# Patient Record
Sex: Male | Born: 1935 | Race: White | Hispanic: No | Marital: Married | Smoking: Former smoker
Health system: Southern US, Community
[De-identification: ages and names within clinical notes are randomized; demographics above are authoritative.]

## PROBLEM LIST (undated history)

## (undated) ENCOUNTER — Emergency Department (HOSPITAL_COMMUNITY)
Admission: EM | Disposition: A | Discharge status: Home / Self Care | Payer: Medicare Other | Attending: Emergency Medicine | Admitting: Emergency Medicine

## (undated) DIAGNOSIS — IMO0002 Reserved for concepts with insufficient information to code with codable children: Secondary | ICD-10-CM

## (undated) DIAGNOSIS — M1990 Unspecified osteoarthritis, unspecified site: Secondary | ICD-10-CM

## (undated) DIAGNOSIS — R7881 Bacteremia: Secondary | ICD-10-CM

## (undated) DIAGNOSIS — K209 Esophagitis, unspecified without bleeding: Secondary | ICD-10-CM

## (undated) DIAGNOSIS — I8390 Asymptomatic varicose veins of unspecified lower extremity: Secondary | ICD-10-CM

## (undated) DIAGNOSIS — I5022 Chronic systolic (congestive) heart failure: Secondary | ICD-10-CM

## (undated) DIAGNOSIS — R600 Localized edema: Secondary | ICD-10-CM

## (undated) DIAGNOSIS — K589 Irritable bowel syndrome without diarrhea: Secondary | ICD-10-CM

## (undated) DIAGNOSIS — I071 Rheumatic tricuspid insufficiency: Secondary | ICD-10-CM

## (undated) DIAGNOSIS — R32 Unspecified urinary incontinence: Secondary | ICD-10-CM

## (undated) DIAGNOSIS — K648 Other hemorrhoids: Secondary | ICD-10-CM

## (undated) DIAGNOSIS — Z5189 Encounter for other specified aftercare: Secondary | ICD-10-CM

## (undated) DIAGNOSIS — G8929 Other chronic pain: Secondary | ICD-10-CM

## (undated) DIAGNOSIS — Z950 Presence of cardiac pacemaker: Secondary | ICD-10-CM

## (undated) DIAGNOSIS — I272 Pulmonary hypertension, unspecified: Secondary | ICD-10-CM

## (undated) DIAGNOSIS — K5790 Diverticulosis of intestine, part unspecified, without perforation or abscess without bleeding: Secondary | ICD-10-CM

## (undated) DIAGNOSIS — I951 Orthostatic hypotension: Secondary | ICD-10-CM

## (undated) DIAGNOSIS — K56609 Unspecified intestinal obstruction, unspecified as to partial versus complete obstruction: Secondary | ICD-10-CM

## (undated) DIAGNOSIS — D649 Anemia, unspecified: Secondary | ICD-10-CM

## (undated) DIAGNOSIS — G2581 Restless legs syndrome: Secondary | ICD-10-CM

## (undated) DIAGNOSIS — F0390 Unspecified dementia without behavioral disturbance: Secondary | ICD-10-CM

## (undated) DIAGNOSIS — I351 Nonrheumatic aortic (valve) insufficiency: Secondary | ICD-10-CM

## (undated) DIAGNOSIS — I4821 Permanent atrial fibrillation: Secondary | ICD-10-CM

## (undated) DIAGNOSIS — R6251 Failure to thrive (child): Secondary | ICD-10-CM

## (undated) DIAGNOSIS — H3530 Unspecified macular degeneration: Secondary | ICD-10-CM

## (undated) DIAGNOSIS — K635 Polyp of colon: Secondary | ICD-10-CM

## (undated) DIAGNOSIS — R269 Unspecified abnormalities of gait and mobility: Secondary | ICD-10-CM

## (undated) DIAGNOSIS — H189 Unspecified disorder of cornea: Secondary | ICD-10-CM

## (undated) DIAGNOSIS — E871 Hypo-osmolality and hyponatremia: Secondary | ICD-10-CM

## (undated) DIAGNOSIS — K269 Duodenal ulcer, unspecified as acute or chronic, without hemorrhage or perforation: Secondary | ICD-10-CM

## (undated) DIAGNOSIS — R609 Edema, unspecified: Secondary | ICD-10-CM

## (undated) DIAGNOSIS — I340 Nonrheumatic mitral (valve) insufficiency: Secondary | ICD-10-CM

## (undated) DIAGNOSIS — K644 Residual hemorrhoidal skin tags: Secondary | ICD-10-CM

## (undated) DIAGNOSIS — K922 Gastrointestinal hemorrhage, unspecified: Secondary | ICD-10-CM

## (undated) DIAGNOSIS — G629 Polyneuropathy, unspecified: Secondary | ICD-10-CM

## (undated) DIAGNOSIS — K5520 Angiodysplasia of colon without hemorrhage: Secondary | ICD-10-CM

## (undated) DIAGNOSIS — I4892 Unspecified atrial flutter: Secondary | ICD-10-CM

## (undated) DIAGNOSIS — I350 Nonrheumatic aortic (valve) stenosis: Secondary | ICD-10-CM

## (undated) DIAGNOSIS — E039 Hypothyroidism, unspecified: Secondary | ICD-10-CM

## (undated) DIAGNOSIS — I10 Essential (primary) hypertension: Secondary | ICD-10-CM

## (undated) HISTORY — DX: Bacteremia: R78.81

## (undated) HISTORY — DX: Polyneuropathy, unspecified: G62.9

## (undated) HISTORY — PX: HEMORRHOID SURGERY: SHX153

## (undated) HISTORY — DX: Unspecified atrial flutter: I48.92

## (undated) HISTORY — DX: Other hemorrhoids: K64.8

## (undated) HISTORY — DX: Chronic systolic (congestive) heart failure: I50.22

## (undated) HISTORY — DX: Residual hemorrhoidal skin tags: K64.4

## (undated) HISTORY — DX: Unspecified intestinal obstruction, unspecified as to partial versus complete obstruction: K56.609

## (undated) HISTORY — DX: Localized edema: R60.0

## (undated) HISTORY — PX: AV NODE ABLATION: SHX1209

## (undated) HISTORY — PX: PACEMAKER INSERTION: SHX728

## (undated) HISTORY — PX: KNEE ARTHROSCOPY: SHX127

## (undated) HISTORY — PX: TONSILLECTOMY: SUR1361

## (undated) HISTORY — DX: Esophagitis, unspecified: K20.9

## (undated) HISTORY — DX: Duodenal ulcer, unspecified as acute or chronic, without hemorrhage or perforation: K26.9

## (undated) HISTORY — DX: Unspecified macular degeneration: H35.30

## (undated) HISTORY — DX: Other chronic pain: G89.29

## (undated) HISTORY — DX: Reserved for concepts with insufficient information to code with codable children: IMO0002

## (undated) HISTORY — DX: Diverticulosis of intestine, part unspecified, without perforation or abscess without bleeding: K57.90

## (undated) HISTORY — DX: Essential (primary) hypertension: I10

## (undated) HISTORY — PX: EYE SURGERY: SHX253

## (undated) HISTORY — PX: BACK SURGERY: SHX140

## (undated) HISTORY — DX: Nonrheumatic mitral (valve) insufficiency: I34.0

## (undated) HISTORY — DX: Edema, unspecified: R60.9

## (undated) HISTORY — DX: Irritable bowel syndrome, unspecified: K58.9

## (undated) HISTORY — DX: Asymptomatic varicose veins of unspecified lower extremity: I83.90

## (undated) HISTORY — DX: Hypo-osmolality and hyponatremia: E87.1

## (undated) HISTORY — DX: Rheumatic tricuspid insufficiency: I07.1

## (undated) HISTORY — DX: Orthostatic hypotension: I95.1

## (undated) HISTORY — DX: Failure to thrive (child): R62.51

## (undated) HISTORY — DX: Permanent atrial fibrillation: I48.21

## (undated) HISTORY — DX: Angiodysplasia of colon without hemorrhage: K55.20

## (undated) HISTORY — DX: Nonrheumatic aortic (valve) insufficiency: I35.1

## (undated) HISTORY — DX: Unspecified abnormalities of gait and mobility: R26.9

## (undated) HISTORY — DX: Pulmonary hypertension, unspecified: I27.20

## (undated) HISTORY — DX: Esophagitis, unspecified without bleeding: K20.90

## (undated) HISTORY — DX: Polyp of colon: K63.5

## (undated) HISTORY — DX: Nonrheumatic aortic (valve) stenosis: I35.0

## (undated) HISTORY — DX: Unspecified disorder of cornea: H18.9

---

## 1959-02-13 HISTORY — PX: INGUINAL HERNIA REPAIR: SUR1180

## 1971-06-15 HISTORY — PX: INGUINAL HERNIA REPAIR: SUR1180

## 1999-05-26 ENCOUNTER — Encounter: Admission: RE | Admit: 1999-05-26 | Discharge: 1999-05-26 | Payer: Self-pay | Admitting: Family Medicine

## 1999-05-26 ENCOUNTER — Encounter: Payer: Self-pay | Admitting: Family Medicine

## 1999-12-21 ENCOUNTER — Encounter: Admission: RE | Admit: 1999-12-21 | Discharge: 1999-12-21 | Payer: Self-pay | Admitting: Family Medicine

## 1999-12-21 ENCOUNTER — Encounter: Payer: Self-pay | Admitting: Family Medicine

## 2000-08-18 ENCOUNTER — Ambulatory Visit (HOSPITAL_COMMUNITY): Admission: RE | Admit: 2000-08-18 | Discharge: 2000-08-18 | Payer: Self-pay | Admitting: *Deleted

## 2000-10-27 ENCOUNTER — Encounter: Admission: RE | Admit: 2000-10-27 | Discharge: 2000-10-27 | Payer: Self-pay | Admitting: Family Medicine

## 2000-10-27 ENCOUNTER — Encounter: Payer: Self-pay | Admitting: Family Medicine

## 2001-06-21 ENCOUNTER — Encounter: Payer: Self-pay | Admitting: Family Medicine

## 2001-06-21 ENCOUNTER — Encounter: Admission: RE | Admit: 2001-06-21 | Discharge: 2001-06-21 | Payer: Self-pay | Admitting: Family Medicine

## 2002-04-26 ENCOUNTER — Encounter: Admission: RE | Admit: 2002-04-26 | Discharge: 2002-06-11 | Payer: Self-pay | Admitting: Otolaryngology

## 2003-07-11 ENCOUNTER — Encounter: Admission: RE | Admit: 2003-07-11 | Discharge: 2003-07-11 | Payer: Self-pay | Admitting: Family Medicine

## 2003-11-05 ENCOUNTER — Inpatient Hospital Stay (HOSPITAL_COMMUNITY): Admission: EM | Admit: 2003-11-05 | Discharge: 2003-11-15 | Payer: Self-pay | Admitting: Emergency Medicine

## 2004-01-15 ENCOUNTER — Ambulatory Visit (HOSPITAL_COMMUNITY): Admission: RE | Admit: 2004-01-15 | Discharge: 2004-01-15 | Payer: Self-pay | Admitting: Internal Medicine

## 2004-01-24 ENCOUNTER — Inpatient Hospital Stay (HOSPITAL_COMMUNITY): Admission: EM | Admit: 2004-01-24 | Discharge: 2004-01-27 | Payer: Self-pay | Admitting: Internal Medicine

## 2004-02-25 ENCOUNTER — Ambulatory Visit (HOSPITAL_COMMUNITY): Admission: RE | Admit: 2004-02-25 | Discharge: 2004-02-25 | Payer: Self-pay | Admitting: Internal Medicine

## 2004-03-10 ENCOUNTER — Ambulatory Visit (HOSPITAL_COMMUNITY): Admission: RE | Admit: 2004-03-10 | Discharge: 2004-03-10 | Payer: Self-pay | Admitting: Internal Medicine

## 2004-05-14 ENCOUNTER — Ambulatory Visit: Payer: Self-pay | Admitting: Internal Medicine

## 2004-06-19 ENCOUNTER — Ambulatory Visit: Payer: Self-pay | Admitting: Internal Medicine

## 2004-07-13 ENCOUNTER — Encounter: Admission: RE | Admit: 2004-07-13 | Discharge: 2004-07-13 | Payer: Self-pay | Admitting: Family Medicine

## 2004-07-21 ENCOUNTER — Encounter: Admission: RE | Admit: 2004-07-21 | Discharge: 2004-07-21 | Payer: Self-pay | Admitting: Family Medicine

## 2004-08-05 ENCOUNTER — Encounter: Admission: RE | Admit: 2004-08-05 | Discharge: 2004-08-05 | Payer: Self-pay | Admitting: Family Medicine

## 2004-08-18 ENCOUNTER — Encounter: Admission: RE | Admit: 2004-08-18 | Discharge: 2004-08-18 | Payer: Self-pay | Admitting: Family Medicine

## 2004-08-26 ENCOUNTER — Ambulatory Visit: Payer: Self-pay | Admitting: Internal Medicine

## 2004-11-16 ENCOUNTER — Ambulatory Visit: Payer: Self-pay | Admitting: Internal Medicine

## 2005-02-19 ENCOUNTER — Ambulatory Visit: Payer: Self-pay | Admitting: Internal Medicine

## 2005-04-13 ENCOUNTER — Ambulatory Visit: Payer: Self-pay | Admitting: Internal Medicine

## 2005-04-25 ENCOUNTER — Emergency Department (HOSPITAL_COMMUNITY): Admission: EM | Admit: 2005-04-25 | Discharge: 2005-04-25 | Payer: Self-pay | Admitting: Emergency Medicine

## 2005-05-18 ENCOUNTER — Ambulatory Visit: Payer: Self-pay | Admitting: Internal Medicine

## 2005-05-27 ENCOUNTER — Ambulatory Visit: Payer: Self-pay | Admitting: Internal Medicine

## 2005-06-03 ENCOUNTER — Ambulatory Visit (HOSPITAL_COMMUNITY): Admission: RE | Admit: 2005-06-03 | Discharge: 2005-06-03 | Payer: Self-pay | Admitting: Internal Medicine

## 2005-07-27 ENCOUNTER — Encounter: Admission: RE | Admit: 2005-07-27 | Discharge: 2005-07-27 | Payer: Self-pay | Admitting: Family Medicine

## 2005-09-23 ENCOUNTER — Ambulatory Visit (HOSPITAL_COMMUNITY): Admission: RE | Admit: 2005-09-23 | Discharge: 2005-09-23 | Payer: Self-pay | Admitting: Interventional Cardiology

## 2006-03-23 ENCOUNTER — Ambulatory Visit (HOSPITAL_COMMUNITY): Admission: RE | Admit: 2006-03-23 | Discharge: 2006-03-23 | Payer: Self-pay | Admitting: Interventional Cardiology

## 2006-03-25 ENCOUNTER — Ambulatory Visit: Payer: Self-pay | Admitting: Internal Medicine

## 2006-03-29 ENCOUNTER — Ambulatory Visit: Payer: Self-pay | Admitting: Cardiology

## 2006-06-21 ENCOUNTER — Ambulatory Visit: Payer: Self-pay | Admitting: Internal Medicine

## 2006-08-25 ENCOUNTER — Ambulatory Visit (HOSPITAL_COMMUNITY): Admission: RE | Admit: 2006-08-25 | Discharge: 2006-08-25 | Payer: Self-pay | Admitting: *Deleted

## 2006-10-19 ENCOUNTER — Encounter: Admission: RE | Admit: 2006-10-19 | Discharge: 2006-10-19 | Payer: Self-pay | Admitting: *Deleted

## 2006-12-19 ENCOUNTER — Encounter: Admission: RE | Admit: 2006-12-19 | Discharge: 2007-03-13 | Payer: Self-pay | Admitting: Family Medicine

## 2008-01-05 ENCOUNTER — Encounter: Admission: RE | Admit: 2008-01-05 | Discharge: 2008-01-05 | Payer: Self-pay | Admitting: Specialist

## 2008-02-21 ENCOUNTER — Encounter: Admission: RE | Admit: 2008-02-21 | Discharge: 2008-02-21 | Payer: Self-pay | Admitting: Interventional Cardiology

## 2008-02-22 ENCOUNTER — Emergency Department (HOSPITAL_COMMUNITY): Admission: EM | Admit: 2008-02-22 | Discharge: 2008-02-22 | Payer: Self-pay | Admitting: Emergency Medicine

## 2008-02-22 ENCOUNTER — Encounter (INDEPENDENT_AMBULATORY_CARE_PROVIDER_SITE_OTHER): Payer: Self-pay | Admitting: Emergency Medicine

## 2008-02-22 ENCOUNTER — Ambulatory Visit: Payer: Self-pay | Admitting: Vascular Surgery

## 2008-03-04 ENCOUNTER — Ambulatory Visit: Payer: Self-pay | Admitting: Oncology

## 2008-04-10 LAB — APTT: aPTT: 43 seconds — ABNORMAL HIGH (ref 24–37)

## 2008-04-10 LAB — CBC & DIFF AND RETIC
Eosinophils Absolute: 0.1 10*3/uL (ref 0.0–0.5)
MONO#: 0.7 10*3/uL (ref 0.1–0.9)
NEUT#: 3 10*3/uL (ref 1.5–6.5)
RBC: 4 10*6/uL — ABNORMAL LOW (ref 4.20–5.71)
RDW: 13.7 % (ref 11.2–14.6)
RETIC #: 63.6 10*3/uL (ref 31.8–103.9)
Retic %: 1.6 % (ref 0.7–2.3)
WBC: 5.1 10*3/uL (ref 4.0–10.0)
lymph#: 1.2 10*3/uL (ref 0.9–3.3)

## 2008-04-10 LAB — PROTHROMBIN TIME: Prothrombin Time: 19.8 seconds — ABNORMAL HIGH (ref 11.6–15.2)

## 2008-04-10 LAB — MORPHOLOGY: PLT EST: ADEQUATE

## 2008-04-11 ENCOUNTER — Encounter: Payer: Self-pay | Admitting: Internal Medicine

## 2008-04-17 LAB — COMPREHENSIVE METABOLIC PANEL
AST: 21 U/L (ref 0–37)
Alkaline Phosphatase: 36 U/L — ABNORMAL LOW (ref 39–117)
BUN: 21 mg/dL (ref 6–23)
Calcium: 9.7 mg/dL (ref 8.4–10.5)
Chloride: 102 mEq/L (ref 96–112)
Creatinine, Ser: 0.74 mg/dL (ref 0.40–1.50)

## 2008-04-17 LAB — CARDIOLIPIN ANTIBODIES, IGG, IGM, IGA: Anticardiolipin IgA: 9 [APL'U] (ref <13)

## 2008-04-17 LAB — BETA-2 GLYCOPROTEIN ANTIBODIES
Beta-2-Glycoprotein I IgA: <4 U/mL (ref <10)
Beta-2-Glycoprotein I IgM: <4 U/mL (ref <10)

## 2008-04-17 LAB — LUPUS ANTICOAGULANT PANEL
PTTLA 4:1 Mix: 56.5 secs — ABNORMAL HIGH (ref 36.3–48.8)
PTTLA Confirmation: 0.4 secs (ref <8.0)

## 2008-04-19 ENCOUNTER — Ambulatory Visit: Payer: Self-pay | Admitting: Oncology

## 2008-05-29 ENCOUNTER — Ambulatory Visit (HOSPITAL_BASED_OUTPATIENT_CLINIC_OR_DEPARTMENT_OTHER): Admission: RE | Admit: 2008-05-29 | Discharge: 2008-05-30 | Payer: Self-pay | Admitting: Specialist

## 2008-07-22 ENCOUNTER — Encounter: Admission: RE | Admit: 2008-07-22 | Discharge: 2008-10-16 | Payer: Self-pay | Admitting: Family Medicine

## 2008-10-28 ENCOUNTER — Ambulatory Visit (HOSPITAL_COMMUNITY): Admission: RE | Admit: 2008-10-28 | Discharge: 2008-10-28 | Payer: Self-pay | Admitting: *Deleted

## 2008-10-29 ENCOUNTER — Ambulatory Visit (HOSPITAL_COMMUNITY): Admission: RE | Admit: 2008-10-29 | Discharge: 2008-10-29 | Payer: Self-pay | Admitting: *Deleted

## 2008-12-02 ENCOUNTER — Encounter: Admission: RE | Admit: 2008-12-02 | Discharge: 2008-12-02 | Payer: Self-pay | Admitting: Neurology

## 2009-02-08 ENCOUNTER — Encounter: Admission: RE | Admit: 2009-02-08 | Discharge: 2009-02-08 | Payer: Self-pay | Admitting: Neurology

## 2009-04-07 ENCOUNTER — Encounter: Admission: RE | Admit: 2009-04-07 | Discharge: 2009-06-11 | Payer: Self-pay | Admitting: Neurology

## 2009-05-14 HISTORY — PX: PACEMAKER REVISION: SHX5999

## 2009-05-31 ENCOUNTER — Emergency Department (HOSPITAL_COMMUNITY): Admission: EM | Admit: 2009-05-31 | Discharge: 2009-05-31 | Payer: Self-pay | Admitting: Emergency Medicine

## 2009-06-14 DIAGNOSIS — K922 Gastrointestinal hemorrhage, unspecified: Secondary | ICD-10-CM

## 2009-06-14 HISTORY — DX: Gastrointestinal hemorrhage, unspecified: K92.2

## 2009-07-29 ENCOUNTER — Ambulatory Visit: Payer: Self-pay | Admitting: Diagnostic Radiology

## 2009-07-29 ENCOUNTER — Encounter: Payer: Self-pay | Admitting: Emergency Medicine

## 2009-07-29 ENCOUNTER — Inpatient Hospital Stay (HOSPITAL_COMMUNITY): Admission: AD | Admit: 2009-07-29 | Discharge: 2009-08-05 | Payer: Self-pay | Admitting: Internal Medicine

## 2009-08-01 ENCOUNTER — Encounter (INDEPENDENT_AMBULATORY_CARE_PROVIDER_SITE_OTHER): Payer: Self-pay | Admitting: Internal Medicine

## 2009-08-20 ENCOUNTER — Ambulatory Visit (HOSPITAL_COMMUNITY): Admission: RE | Admit: 2009-08-20 | Discharge: 2009-08-20 | Payer: Self-pay | Admitting: Interventional Radiology

## 2009-09-09 ENCOUNTER — Encounter: Payer: Self-pay | Admitting: Interventional Radiology

## 2010-01-02 ENCOUNTER — Encounter: Admission: RE | Admit: 2010-01-02 | Discharge: 2010-03-13 | Payer: Self-pay | Admitting: Ophthalmology

## 2010-02-12 DIAGNOSIS — K56609 Unspecified intestinal obstruction, unspecified as to partial versus complete obstruction: Secondary | ICD-10-CM

## 2010-02-12 HISTORY — DX: Unspecified intestinal obstruction, unspecified as to partial versus complete obstruction: K56.609

## 2010-03-04 ENCOUNTER — Inpatient Hospital Stay (HOSPITAL_COMMUNITY): Admission: EM | Admit: 2010-03-04 | Discharge: 2010-03-07 | Payer: Self-pay | Admitting: Emergency Medicine

## 2010-03-23 ENCOUNTER — Encounter: Payer: Self-pay | Admitting: Pulmonary Disease

## 2010-03-29 ENCOUNTER — Emergency Department (HOSPITAL_COMMUNITY): Admission: EM | Admit: 2010-03-29 | Discharge: 2010-03-29 | Payer: Self-pay | Admitting: Emergency Medicine

## 2010-04-06 ENCOUNTER — Encounter: Payer: Self-pay | Admitting: Pulmonary Disease

## 2010-04-09 ENCOUNTER — Encounter: Payer: Self-pay | Admitting: Pulmonary Disease

## 2010-04-11 ENCOUNTER — Encounter: Payer: Self-pay | Admitting: Pulmonary Disease

## 2010-04-14 ENCOUNTER — Encounter: Payer: Self-pay | Admitting: Pulmonary Disease

## 2010-04-14 ENCOUNTER — Encounter: Admission: RE | Admit: 2010-04-14 | Discharge: 2010-04-14 | Payer: Self-pay | Admitting: Internal Medicine

## 2010-04-15 DIAGNOSIS — F068 Other specified mental disorders due to known physiological condition: Secondary | ICD-10-CM | POA: Insufficient documentation

## 2010-04-16 ENCOUNTER — Ambulatory Visit: Payer: Self-pay | Admitting: Pulmonary Disease

## 2010-04-16 DIAGNOSIS — J90 Pleural effusion, not elsewhere classified: Secondary | ICD-10-CM

## 2010-04-17 ENCOUNTER — Encounter: Payer: Self-pay | Admitting: Pulmonary Disease

## 2010-04-23 ENCOUNTER — Encounter: Payer: Self-pay | Admitting: Pulmonary Disease

## 2010-07-08 ENCOUNTER — Emergency Department (HOSPITAL_COMMUNITY)
Admission: EM | Admit: 2010-07-08 | Discharge: 2010-07-08 | Payer: Self-pay | Source: Home / Self Care | Admitting: Emergency Medicine

## 2010-07-16 NOTE — Assessment & Plan Note (Signed)
Summary: consult for bilateral pleural effusions.   Visit Type:  Initial Consult Copy to:  Art Robyne Askew Primary Provider/Referring Provider:  Merri Brunette MD  CC:  pulmonary consult. pt states he gets real sob with activity and at rest. .  History of Present Illness: The pt is an 77 y/lo male who I have been asked to see for a persistently abnormal chest xray.  The pt has recently had RLL pna followed by LLL infiltrate, and has been treated appropriately with avelox.  When he had RLL pna, the question was raised about a hilar abnormality, and he subsequently underwent ct chest.  He was found to have large bilat effusions with compressive atx in bases, as well as vascular congestion.  He had no isolated infiltrate or LN.  The pt has known h/o cardiac disease, and most recently found to have very elevated BNP and hyponatremia, c/w volume overload.  He has also been having worsening LE edema.   The question has also been raised about aspiration, given his dementia, and alternating lower lobe infiltrate on cxr.  He is scheduled to have a speech evaluation.  The pt currently denies any cough, congestion, or purulent mucus.  In fact, he never had any of these symptoms even when he was found to have the infiltrates.  He has chronic doe, and sometimes can occur at rest.  He denies any swallowing issues or coughing/choking related to by mouth intake.  He does have an elevated TSH recently, and adjustments are being made to his meds.  He has also had LE venous dopplers recently which were negative for DVT.    Preventive Screening-Counseling & Management  Alcohol-Tobacco     Smoking Status: quit  Current Medications (verified): 1)  Vitamin C 500 Mg Tabs (Ascorbic Acid) .... Once Daily 2)  Restasis 0.05 % Emul (Cyclosporine) .... Instill 1 Drop Into Each Eye Two Times A Day 3)  Aricept 10 Mg Tabs (Donepezil Hcl) .... Once Daily 4)  Senna-S 8.6-50 Mg Tabs (Sennosides-Docusate Sodium) .... 2 Tablets  At Bedtime 5)  Ferrous Sulfate 325 (65 Fe) Mg Tabs (Ferrous Sulfate) .... Once Daily 6)  Fish Oil 1000 Mg Caps (Omega-3 Fatty Acids) .... One Tablet Two Times A Day 7)  Hyoscyamine Sulfate 0.125 Mg Tabs (Hyoscyamine Sulfate) .... Once Daily As Needed To Help Bowl Discomfort 8)  Icaps Plus  Tabs (Multiple Vitamins-Minerals) .... Once Daily 9)  Levothyroxine Sodium 75 Mcg Tabs (Levothyroxine Sodium) .... Once Daily For Thyroid 10)  Warfarin Sodium 5 Mg Tabs (Warfarin Sodium) .... As Directed 11)  Multivitamins  Tabs (Multiple Vitamin) .... Once Daily 12)  Miralax  Powd (Polyethylene Glycol 3350) .... Mix 17 Grams With  8 Oz of Water 13)  Oxycodone Hcl 5 Mg Tabs (Oxycodone Hcl) .... Take One Tablet Every 4 Hours As Needed 14)  Aminofen 325 Mg Tabs (Acetaminophen) .... Taje 2 Tablets Every 4 Hours As Needed For Pain 15)  Flexeril 5 Mg Tabs (Cyclobenzaprine Hcl) .... Take One Tablet Every 8 Hrs As Needed  For Muscle Relaxer 16)  Lorazepam 0.5 Mg Tabs (Lorazepam) .... One Tablet Two Times A Day 17)  Meclizine Hcl 25 Mg Tabs (Meclizine Hcl) .... One Up To 3 Times A Day As Needed For Dizziness 18)  Systane 0.4-0.3 % Soln (Polyethyl Glycol-Propyl Glycol) .... Instill 1 Drop Into Both Eyes Once Daily  Allergies (verified): 1)  ! Pcn  Past History:  Past Medical History: CAF--s/p ablation and PPM CAD (ICD-414.00) HYPOTHYROIDISM (ICD-244.9)  DEMENTIA  of alzheimer's type  Past Surgical History: hernia repair pacemaker insertion hemroidectomy  Family History: Reviewed history and no changes required. emphysema--mother  Social History: Reviewed history and no changes required. Patient states former smoker. started age 36. 2 ppd. quit 1973.  married children--4 occupation--retired--chemist Smoking Status:  quit  Review of Systems       The patient complains of shortness of breath with activity, shortness of breath at rest, loss of appetite, hand/feet swelling, and joint stiffness or  pain.  The patient denies productive cough, non-productive cough, coughing up blood, chest pain, irregular heartbeats, acid heartburn, indigestion, weight change, abdominal pain, difficulty swallowing, sore throat, tooth/dental problems, headaches, nasal congestion/difficulty breathing through nose, sneezing, itching, ear ache, anxiety, depression, rash, change in color of mucus, and fever.    Vital Signs:  Patient profile:   77 year old male Height:      66 inches Weight:      138.13 pounds BMI:     22.38 O2 Sat:      96 % on Room air Temp:     98.4 degrees F oral Pulse rate:   76 / minute BP sitting:   116 / 70  (left arm) Cuff size:   regular  Vitals Entered By: Carver Fila (April 16, 2010 9:38 AM)  O2 Flow:  Room air CC: pulmonary consult. pt states he gets real sob with activity and at rest.  Comments meds and allergies updated Phone number updated Carver Fila  April 16, 2010 9:45 AM    Physical Exam  General:  frail appearing male in nad Eyes:  PERRLA and EOMI.   Nose:  patent without discharge Mouth:  clear, no exudates. Neck:  no jvd, tmg, LN Lungs:  decreased bs with crackles in the bases, no wheezing or rhonchi Heart:  rrr, 3/6 sem Abdomen:  soft and nontender, bs+ Extremities:  3+ edema bilat, no cyanosis  pulses decreased bilat Neurologic:  alert and oriented, moves all 4.   Impression & Recommendations:  Problem # 1:  PLEURAL EFFUSION (ICD-511.9)  the pt has moderately large effusions bilat that are most likely due to CHF.  He has known chronic heart disease, has had worsening LE edema since his diuretic dose was decreased, has BNP of 731, and has had hyponatremia recently.  I would start with aggressive treatment of his CHF, and see if the effusions improve.  If they do not, we could consider thoracentesis on the right.  It is unclear to me whether the pt ever had "pna".  He had no symptoms c/w this diagnosis, and the radiographic changes could be  explained by CHF.  If he did have pna on right and left, would assume silent aspiration until proven otherwise.  Agree with speech evaluation.  Would be happy to see pt again if he does not improve with diuresis, or if worsening pulmonary symptoms develop.  Other Orders: Consultation Level V [phone])  Patient Instructions: 1)  will send a note to Dr Chilton Si and Dr. Katrinka Blazing recommending more aggressive treatment of heart failure. 2)  agree with speech swallowing eval to exclude aspiration 3)  would be happy to see again if needed.    Immunization History:  Influenza Immunization History:    Influenza:  historical (02/12/2010)   Appended Document: consult for bilateral pleural effusions. swallowing eval without frank aspiration, but evidence for LPR with possible aspiration.

## 2010-08-27 LAB — CBC
HCT: 33.7 % — ABNORMAL LOW (ref 39.0–52.0)
HCT: 37.3 % — ABNORMAL LOW (ref 39.0–52.0)
MCV: 101.2 fL — ABNORMAL HIGH (ref 78.0–100.0)
Platelets: 183 10*3/uL (ref 150–400)
Platelets: 215 10*3/uL (ref 150–400)
RBC: 3.68 MIL/uL — ABNORMAL LOW (ref 4.22–5.81)
RDW: 13.7 % (ref 11.5–15.5)
WBC: 5.2 10*3/uL (ref 4.0–10.5)
WBC: 5.3 10*3/uL (ref 4.0–10.5)
WBC: 8.6 10*3/uL (ref 4.0–10.5)

## 2010-08-27 LAB — POCT I-STAT, CHEM 8
BUN: 22 mg/dL (ref 6–23)
Chloride: 94 mEq/L — ABNORMAL LOW (ref 96–112)
Creatinine, Ser: 1 mg/dL (ref 0.4–1.5)
Glucose, Bld: 138 mg/dL — ABNORMAL HIGH (ref 70–99)
Potassium: 4.1 mEq/L (ref 3.5–5.1)
Sodium: 126 mEq/L — ABNORMAL LOW (ref 135–145)

## 2010-08-27 LAB — PROTIME-INR
INR: 2.86 — ABNORMAL HIGH (ref 0.00–1.49)
INR: 3.39 — ABNORMAL HIGH (ref 0.00–1.49)

## 2010-08-27 LAB — URINE CULTURE
Colony Count: NO GROWTH
Culture  Setup Time: 201109210354
Culture: NO GROWTH

## 2010-08-27 LAB — COMPREHENSIVE METABOLIC PANEL
ALT: 15 U/L (ref 0–53)
AST: 23 U/L (ref 0–37)
AST: 26 U/L (ref 0–37)
Albumin: 3.2 g/dL — ABNORMAL LOW (ref 3.5–5.2)
Alkaline Phosphatase: 28 U/L — ABNORMAL LOW (ref 39–117)
Alkaline Phosphatase: 35 U/L — ABNORMAL LOW (ref 39–117)
CO2: 28 mEq/L (ref 19–32)
Chloride: 91 mEq/L — ABNORMAL LOW (ref 96–112)
Creatinine, Ser: 0.87 mg/dL (ref 0.4–1.5)
GFR calc Af Amer: >60 mL/min (ref >60)
GFR calc non Af Amer: >60 mL/min (ref >60)
Potassium: 3.7 mEq/L (ref 3.5–5.1)
Potassium: 4 mEq/L (ref 3.5–5.1)
Sodium: 130 mEq/L — ABNORMAL LOW (ref 135–145)
Total Bilirubin: 1.2 mg/dL (ref 0.3–1.2)
Total Protein: 5.5 g/dL — ABNORMAL LOW (ref 6.0–8.3)

## 2010-08-27 LAB — BASIC METABOLIC PANEL
BUN: 15 mg/dL (ref 6–23)
CO2: 24 mEq/L (ref 19–32)
Calcium: 7.9 mg/dL — ABNORMAL LOW (ref 8.4–10.5)
Chloride: 104 mEq/L (ref 96–112)
GFR calc Af Amer: >60 mL/min (ref >60)
GFR calc non Af Amer: >60 mL/min (ref >60)
Glucose, Bld: 85 mg/dL (ref 70–99)
Potassium: 3.2 mEq/L — ABNORMAL LOW (ref 3.5–5.1)
Potassium: 3.6 mEq/L (ref 3.5–5.1)
Sodium: 133 mEq/L — ABNORMAL LOW (ref 135–145)

## 2010-08-27 LAB — URINALYSIS, ROUTINE W REFLEX MICROSCOPIC
Glucose, UA: NEGATIVE mg/dL
Ketones, ur: NEGATIVE mg/dL
Nitrite: NEGATIVE
Nitrite: NEGATIVE
Protein, ur: NEGATIVE mg/dL
Specific Gravity, Urine: 1.019 (ref 1.005–1.030)
Specific Gravity, Urine: 1.034 — ABNORMAL HIGH (ref 1.005–1.030)
Urobilinogen, UA: 0.2 mg/dL (ref 0.0–1.0)
pH: 7.5 (ref 5.0–8.0)

## 2010-08-27 LAB — OSMOLALITY: Osmolality: 267 mOsm/kg — ABNORMAL LOW (ref 275–300)

## 2010-08-27 LAB — DIFFERENTIAL
Basophils Absolute: 0 10*3/uL (ref 0.0–0.1)
Basophils Relative: 0 % (ref 0–1)
Basophils Relative: 0 % (ref 0–1)
Basophils Relative: 1 % (ref 0–1)
Eosinophils Absolute: 0 10*3/uL (ref 0.0–0.7)
Eosinophils Absolute: 0.1 10*3/uL (ref 0.0–0.7)
Eosinophils Absolute: 0.1 10*3/uL (ref 0.0–0.7)
Eosinophils Relative: 1 % (ref 0–5)
Eosinophils Relative: 1 % (ref 0–5)
Lymphs Abs: 1.2 10*3/uL (ref 0.7–4.0)
Monocytes Absolute: 0.9 10*3/uL (ref 0.1–1.0)
Monocytes Absolute: 0.9 10*3/uL (ref 0.1–1.0)
Monocytes Absolute: 1.1 10*3/uL — ABNORMAL HIGH (ref 0.1–1.0)
Monocytes Relative: 13 % — ABNORMAL HIGH (ref 3–12)
Monocytes Relative: 17 % — ABNORMAL HIGH (ref 3–12)
Neutro Abs: 2.8 10*3/uL (ref 1.7–7.7)
Neutro Abs: 6.5 10*3/uL (ref 1.7–7.7)
Neutrophils Relative %: 59 % (ref 43–77)

## 2010-08-27 LAB — TSH: TSH: 2.981 u[IU]/mL (ref 0.350–4.500)

## 2010-08-27 LAB — PHOSPHORUS: Phosphorus: 3.1 mg/dL (ref 2.3–4.6)

## 2010-08-27 LAB — OSMOLALITY, URINE: Osmolality, Ur: 415 mOsm/kg (ref 390–1090)

## 2010-08-27 LAB — APTT: aPTT: 55 seconds — ABNORMAL HIGH (ref 24–37)

## 2010-08-27 LAB — POCT CARDIAC MARKERS

## 2010-09-02 LAB — CBC
HCT: 35.4 % — ABNORMAL LOW (ref 39.0–52.0)
Hemoglobin: 13.6 g/dL (ref 13.0–17.0)
MCHC: 34.9 g/dL (ref 30.0–36.0)
MCHC: 35 g/dL (ref 30.0–36.0)
MCV: 101.7 fL — ABNORMAL HIGH (ref 78.0–100.0)
Platelets: 182 10*3/uL (ref 150–400)
RBC: 3.48 MIL/uL — ABNORMAL LOW (ref 4.22–5.81)
RBC: 3.83 MIL/uL — ABNORMAL LOW (ref 4.22–5.81)
WBC: 5.4 10*3/uL (ref 4.0–10.5)
WBC: 7.4 10*3/uL (ref 4.0–10.5)

## 2010-09-02 LAB — TESTOSTERONE: Testosterone: 307.87 ng/dL — ABNORMAL LOW (ref 350–890)

## 2010-09-02 LAB — COMPREHENSIVE METABOLIC PANEL
ALT: 16 U/L (ref 0–53)
AST: 25 U/L (ref 0–37)
Alkaline Phosphatase: 49 U/L (ref 39–117)
CO2: 25 mEq/L (ref 19–32)
Chloride: 97 mEq/L (ref 96–112)
GFR calc non Af Amer: >60 mL/min (ref >60)
Glucose, Bld: 84 mg/dL (ref 70–99)
Potassium: 3.9 mEq/L (ref 3.5–5.1)
Sodium: 131 mEq/L — ABNORMAL LOW (ref 135–145)
Total Bilirubin: 1.2 mg/dL (ref 0.3–1.2)

## 2010-09-02 LAB — PROTIME-INR
INR: 1.39 (ref 0.00–1.49)
INR: 1.48 (ref 0.00–1.49)
INR: 1.51 — ABNORMAL HIGH (ref 0.00–1.49)
INR: 1.87 — ABNORMAL HIGH (ref 0.00–1.49)
Prothrombin Time: 15.8 seconds — ABNORMAL HIGH (ref 11.6–15.2)
Prothrombin Time: 15.8 seconds — ABNORMAL HIGH (ref 11.6–15.2)
Prothrombin Time: 18.1 seconds — ABNORMAL HIGH (ref 11.6–15.2)

## 2010-09-02 LAB — TSH: TSH: 2.382 u[IU]/mL (ref 0.350–4.500)

## 2010-09-02 LAB — VITAMIN D 1,25 DIHYDROXY: Vitamin D3 1, 25 (OH)2: 30 pg/mL

## 2010-09-02 LAB — VITAMIN D 25 HYDROXY (VIT D DEFICIENCY, FRACTURES): Vit D, 25-Hydroxy: 42 ng/mL (ref 30–89)

## 2010-09-02 LAB — SODIUM, URINE, RANDOM: Sodium, Ur: 74 mEq/L

## 2010-09-02 LAB — BASIC METABOLIC PANEL
BUN: 8 mg/dL (ref 6–23)
CO2: 26 mEq/L (ref 19–32)
Chloride: 97 mEq/L (ref 96–112)
Creatinine, Ser: 0.57 mg/dL (ref 0.4–1.5)
Potassium: 3.8 mEq/L (ref 3.5–5.1)

## 2010-09-02 LAB — CORTISOL-AM, BLOOD: Cortisol - AM: 11.8 ug/dL (ref 4.3–22.4)

## 2010-09-03 LAB — CBC
HCT: 37.1 % — ABNORMAL LOW (ref 39.0–52.0)
Hemoglobin: 13 g/dL (ref 13.0–17.0)
MCV: 100.6 fL — ABNORMAL HIGH (ref 78.0–100.0)
Platelets: 173 10*3/uL (ref 150–400)
RBC: 3.69 MIL/uL — ABNORMAL LOW (ref 4.22–5.81)
WBC: 7.6 10*3/uL (ref 4.0–10.5)

## 2010-09-03 LAB — BASIC METABOLIC PANEL
Chloride: 89 mEq/L — ABNORMAL LOW (ref 96–112)
GFR calc non Af Amer: >60 mL/min (ref >60)
Potassium: 3.9 mEq/L (ref 3.5–5.1)
Sodium: 125 mEq/L — ABNORMAL LOW (ref 135–145)

## 2010-09-03 LAB — URINALYSIS, ROUTINE W REFLEX MICROSCOPIC
Glucose, UA: NEGATIVE mg/dL
Ketones, ur: NEGATIVE mg/dL
Nitrite: NEGATIVE
Specific Gravity, Urine: 1.011 (ref 1.005–1.030)
pH: 6.5 (ref 5.0–8.0)

## 2010-09-03 LAB — DIFFERENTIAL
Eosinophils Absolute: 0.1 10*3/uL (ref 0.0–0.7)
Eosinophils Relative: 1 % (ref 0–5)
Lymphocytes Relative: 14 % (ref 12–46)
Lymphs Abs: 1.1 10*3/uL (ref 0.7–4.0)
Monocytes Absolute: 0.9 10*3/uL (ref 0.1–1.0)
Monocytes Relative: 12 % (ref 3–12)

## 2010-09-03 LAB — PROTIME-INR: Prothrombin Time: 30.2 seconds — ABNORMAL HIGH (ref 11.6–15.2)

## 2010-09-04 ENCOUNTER — Other Ambulatory Visit: Payer: Self-pay | Admitting: General Surgery

## 2010-09-06 LAB — WOUND CULTURE: Gram Stain: NONE SEEN

## 2010-09-10 ENCOUNTER — Ambulatory Visit
Admission: RE | Admit: 2010-09-10 | Discharge: 2010-09-10 | Disposition: A | Discharge status: Home / Self Care | Payer: Medicare Other | Source: Ambulatory Visit | Attending: General Surgery | Admitting: General Surgery

## 2010-09-14 LAB — CK TOTAL AND CKMB (NOT AT ARMC)
CK, MB: 1.3 ng/mL (ref 0.3–4.0)
Relative Index: INVALID (ref 0.0–2.5)
Total CK: 34 U/L (ref 7–232)

## 2010-09-14 LAB — APTT: aPTT: 41 seconds — ABNORMAL HIGH (ref 24–37)

## 2010-09-14 LAB — POCT I-STAT, CHEM 8
HCT: 40 % (ref 39.0–52.0)
Hemoglobin: 13.6 g/dL (ref 13.0–17.0)
Potassium: 4.2 mEq/L (ref 3.5–5.1)
Sodium: 133 mEq/L — ABNORMAL LOW (ref 135–145)
TCO2: 27 mmol/L (ref 0–100)

## 2010-09-14 LAB — URINE MICROSCOPIC-ADD ON

## 2010-09-14 LAB — URINALYSIS, ROUTINE W REFLEX MICROSCOPIC
Bilirubin Urine: NEGATIVE
Glucose, UA: NEGATIVE mg/dL
Ketones, ur: NEGATIVE mg/dL
Leukocytes, UA: NEGATIVE
Nitrite: NEGATIVE
Protein, ur: NEGATIVE mg/dL
Specific Gravity, Urine: 1.017 (ref 1.005–1.030)
Urobilinogen, UA: 0.2 mg/dL (ref 0.0–1.0)
pH: 7.5 (ref 5.0–8.0)

## 2010-09-14 LAB — CBC
HCT: 37.8 % — ABNORMAL LOW (ref 39.0–52.0)
Platelets: 168 10*3/uL (ref 150–400)
RDW: 14 % (ref 11.5–15.5)
WBC: 5.9 10*3/uL (ref 4.0–10.5)

## 2010-09-14 LAB — PROTIME-INR: Prothrombin Time: 27.7 seconds — ABNORMAL HIGH (ref 11.6–15.2)

## 2010-09-14 LAB — TROPONIN I

## 2010-10-27 NOTE — Op Note (Signed)
NAMEDAIEL, STROHECKER NO.:  [id number]   MEDICAL RECORD NO.:  [id number]          PATIENT TYPE:  AMB   LOCATION:  ENDO                         FACILITY[facility]  [facility]   PHYSICIAN:  Georgiana Spinner, M.D.    DATE OF BIRTH:  1922/12/09   DATE OF PROCEDURE:  DATE OF DISCHARGE:                               OPERATIVE REPORT   PROCEDURE:  Colonoscopy.   INDICATIONS:  Colon polyps.   ANESTHESIA:  Fentanyl 50 mcg, Versed 5 mg.   PROCEDURE:  With the patient mildly sedated in the left lateral  decubitus position, the Pentax videoscopic pediatric colonoscope was  inserted in the rectum and passed under direct vision to the ascending  colon and could advance no further.  It was noted that the prep was poor  and that there were multiple areas of thick brownish liquid material  mixed with solid stool balls and food material seen throughout the  colon, but despite turning the patient onto his back and using pressure  to the abdomen, we could advance no further.  We then elected to  withdraw the colonoscope, taking circumferential views of colonic  mucosa, stopping in the rectum which appeared normal on direct and  retroflexed view.  The endoscope was straightened and withdrawn.  The  patient's vital signs and pulse oximeter remained stable.  The patient  tolerated the procedure well without apparent complications.   FINDINGS:  Tortuous colon, diverticular filled, with small diverticula  seen throughout, and poor prep precludes thorough examination.   PLAN:  Proceed to barium enema and have patient follow up with me as an  outpatient.           ______________________________  Georgiana Spinner, M.D.     GMO/MEDQ  D:  10/28/2008  T:  10/28/2008  Job:  [id number]   cc:   Quita Skye. Artis Flock, M.D.  Fax: [phone]

## 2010-10-27 NOTE — Op Note (Signed)
[name], [name]                ACCOUNT NO.:  [id number]   MEDICAL RECORD NO.:  [id number]          PATIENT TYPE:  AMB   LOCATION:  N[facility]C                         FACILITY:  [facility]   PHYSICIAN:  Erasmo Leventhal, M.D.DATE OF BIRTH:  [birth year]   DATE OF PROCEDURE:  05/29/2008  DATE OF DISCHARGE:  05/30/2008                               OPERATIVE REPORT   PREOPERATIVE DIAGNOSIS:  Right knee hemarthroses with osteoarthritis,  possible internal derangement.   POSTOPERATIVE DIAGNOSIS:  Right knee recurrent hemarthroses,  osteoarthritis advanced, medial meniscal tear with chondrocalcinosis.   PROCEDURE:  Right knee arthroscopic chondroplasty of medial and lateral  compartments, tricompartmental synovectomy and partial medial  meniscectomy.   SURGEON:  Erasmo Leventhal, M.D.   ASSISTANT:  Jaquelyn Bitter. Chabon, P.A.   ANESTHESIA:  Was local knee block with LMA.  Jill Side, M.D.   ESTIMATED BLOOD LOSS:  Less than 10 mL.   DRAINS:  One medium Hemovac.   COMPLICATIONS:  None.   TOURNIQUET TIME:  None.   DISPOSITION:  PACU stable.   DESCRIPTION OF PROCEDURE:  The patient and family were counseled in the  holding area and correct side was identified.  IV was started,  antibiotics given, block administered, taken to the operating room and  placed in position.  Prepped with DuraPrep and draped in sterile  fashion.   At the first of the case proximal medial, anterior and lateral portals  were established.  The patient was moving quite a bit and uncomfortable  at this point in time and it was decided by Dr. Shireen Quan to place him  under LMA anesthesia.  Following this the procedure was continued.  Patellofemoral joint revealed bone against bone grooving with pitting of  the bone.  I think this was where the hemarthroses were occurring from  was osseous.  There was a tremendous amounts of synovitis and hemosiderin stained  synovium.  This was debrided in a meticulous  fashion circumferentially  in all three compartments.  Medial and lateral compartments were  inspected.  There was chondrocalcinosis lateral side and some  chondromalacia so chondroplasty was formed.  Also extended down to  grooving of the bone on the lateral side.  Again grade IV changes.  On  the medial side there was chondrocalcinosis and degenerative meniscus  and it was debrided back and smoothed with a shaver and a light  chondroplasty formed.  Knee was then sequentially reinspected.  There  were no other abnormalities noted.  Irrigant and arthroscopic equipment  was removed.  At the end of the case a medium Hemovac drain was placed  expecting pus from postop hemarthrosis.  Sterile dressings applied.  The  portals were closed with 4-0 nylon suture.  Another 20 mL of 0.5%  Marcaine with epi was placed into the joint knee and 4 mg of morphine  sulfate after confirming with anesthesia.  Sterile dressings applied  along with knee immobilizer and ice pack.  He tolerated the procedure  well with no known complications or problems.  He was awakened and taken  from the operating room to the  PACU in stable condition.  He will be  kept overnight for observation and monitoring.           ______________________________  Erasmo Leventhal, M.D.     RAC/MEDQ  D:  05/29/2008  T:  05/30/2008  Job:  [id number]

## 2010-10-30 NOTE — Op Note (Signed)
[name], [name]                ACCOUNT NO.:  [id number]   MEDICAL RECORD NO.:  [id number]          PATIENT TYPE:  AMB   LOCATION:  ENDO                         FACILITY:  [facility]   PHYSICIAN:  Georgiana Spinner, M.D.    DATE OF BIRTH:  05-Oct-1922   DATE OF PROCEDURE:  08/25/2006  DATE OF DISCHARGE:                               OPERATIVE REPORT   PROCEDURES PERFORMED:  Colonoscopy.   INDICATIONS:  Colon polyp, rectal bleeding.   ANESTHESIA:  Fentanyl 62.5 mcg, Versed 7 mg.   PROCEDURE:  With the patient mildly sedated in the left lateral  decubitus position and subsequently rolled to his back with pressure  applied, Pentax videoscopic pediatric colonoscope was inserted into the  rectum and passed under direct vision.  With pressure applied as noted,  we were able to reach the cecum, identified by crow's foot of cecum and  ileocecal valve.  Prep was poor, and there were areas where there was  brown, thick stool, liquid in nature, that had to be suctioned.  There  were also areas where there was solid stool, so exam was limited  somewhat by the prep, but from this point, the colonoscope was slowly  withdrawn taking circumferential views of colonic mucosa stopping to  photograph significant diverticulosis seen in the sigmoid colon and  descending colon until we reached the polyp just below the sigmoid colon  which was photographed and removed using snare cautery technique setting  of 20/200 blended current.  The tissue was suctioned through the  endoscope and retrieved through a tissue trap.  The endoscope was then  withdrawn all the way to the rectum which appeared normal on direct but  showed hemorrhoids on retroflexed view.  The endoscope was straightened  and withdrawn.  The patient's vital signs and pulse oximeter remained  stable.  The patient tolerated the procedure well without apparent  complications.   FINDINGS:  Small polyp of the sigmoid colon removed by snare cautery  technique, significant diverticulosis involving the sigmoid and  descending colons, internal hemorrhoids and exam limited by prep.   PLAN:  Await biopsy report.  The patient will call me for results and  follow up with me as an outpatient.           ______________________________  Georgiana Spinner, M.D.     GMO/MEDQ  D:  08/25/2006  T:  08/26/2006  Job:  [id number]   cc:   Billie Lade, M.D.

## 2010-10-30 NOTE — Op Note (Signed)
NAMEEMILY, FORSE NO.:  [id number]   MEDICAL RECORD NO.:  [id number]                   PATIENT TYPE:  INP   LOCATION:  0348                                 FACILITY:  [facility]   PHYSICIAN:  Lina Sar, M.D. [facility]               DATE OF BIRTH:  02/11/1923   DATE OF PROCEDURE:  01/26/2004  DATE OF DISCHARGE:                                 OPERATIVE REPORT   PROCEDURE:  Upper endoscopy.   INDICATION:  This 77 year old white male was admitted with acute upper GI  bleed.  He has history of paroxysmal atrial fibrillation and was started on  Coumadin in May 2005 after cardioversion.  He has been passing black, tarry  stool for 1 week and came to the hospital with hemoglobin of 8 g which  dropped over a period of 24 hours from 9.8 to 8.0.  His stool was Hemoccult  positive.  He has been on Coumadin as well as aspirin on daily basis but  denied any abdominal pain.  He has history of colonoscopy by Dr. Virginia Rochester in  2002, with findings of a polyp.  The patient's INR on admission was 2.9.  The Coumadin and aspirin were discontinued.  The patient's INR drifted down  to 2 this morning, and he is undergoing upper endoscopy to further evaluate  his GI blood loss.   ENDOSCOPE:  Olympus single-channel video endoscope.   SEDATION:  1. Versed 2.5 mg IV.  2. Fentanyl 25 mcg IV.   FINDINGS:  Olympus single channel video endoscope passed under direct vision  into the posterior pharynx into esophagus.  The patient was monitored by  pulse oximetry.  His oxygen saturations were satisfactory.  Proximal and  distal esophageal mucosa was unremarkable.  There was no food.  There was no  esophagitis.  There was a very discrete gastroesophageal junction with tiny  fibrous ring which was nonobstructing.  Distal to the GE junction was a  small, easily reducible hiatal hernia which measured about 1 cm.   STOMACH:  The stomach was insufflated with air and showed no evidence of  blood  in the stomach.  Rugal folds were normal.  In the gastric antrum,  scattered erosions were noted in prepyloric area.  They measured about 1 cm  in diameter.  They showed some edema and some pinpoint abrasions.  There was  no active bleeding and no coffee-ground material in the stomach.  Pyloric  outlet showed spasm, but I was able to enter duodenal bulb without  difficulty.   DUODENUM:  Duodenal bulb and descending duodenum was normal.  It was noted  patient had both major and minor biliary papillae which appeared normal.  Endoscope was then brought back into the stomach, retroflexed.  Fundus and  cardia were seen from the retroflexed view and appeared normal.  Biopsies  were taken from the vicinity of the erosions for CLOtest.  The patient  tolerated the procedure well.   IMPRESSION:  Mild antral gastris, no evidence of acute bleeding, status post  CLOtest.   PLAN:  1. The patient is currently stable from GI standpoint.  He has received 2     units of packed cells.  2. He will continue on Protonix and stay off Coumadin and aspirin.  3. He will follow up with Dr. Lewayne Bunting to assess __________     anticoagulation.  If he is back on an anticoagulant, he will have to be     covered permanently with proton pump inhibitor.  4. He is also found to be iron deficient, and he will be started on iron     supplements.  5. We will await results of the CLOtest.  6. The patient will follow up with Dr. Artis Flock.                                               Lina Sar, M.D. [facility]    DB/MEDQ  D:  01/26/2004  T:  01/26/2004  Job:  [id number]   cc:   Quita Skye. Artis Flock, M.D.  [address]  [place]  [place] [address]  Fax: [phone]   Iva Boop, M.D. [facility]

## 2010-10-30 NOTE — Assessment & Plan Note (Signed)
[facility]                           GASTROENTEROLOGY OFFICE NOTE   NAME:Gill, Edward PEW                       MRN:          [id number]  DATE:03/25/2006                            DOB:          15-Mar-1923    [name] is a 77 year old gentleman with a history of upper gastrointestinal  bleeds, requiring hospitalization in 2005. He has positive anemia. There was  a questionable small bowel mass on the small bowel capsule endoscopy in  September 2005, but the small bowel folds were normal. He was on iron  supplements and his hemoglobin has stabilized. He comes to [place] today with  change in his bowel habits. He denies abdominal pain, but has had some  weight loss of about 10 pounds over the past year. He also has irregular  diarrheal stools which occur intermittently. He also has a lot of bloating  and gas.   MEDICATIONS:  1. Metoprolol 50 mg daily.  2. Lorazepam 0.5 mg daily.  3. Detrol LA 4 mg daily.  4. Coumadin.  5. Synthroid 50 mcg daily.  6. Digitek 0.25 daily.  7. Tylenol.  8. Calcium.  9. Fish oil.  10.Vitamin C.  11.Vitamin E.  12.Folic acid.   PHYSICAL EXAMINATION:  VITAL SIGNS: Blood pressure 140/62, pulse 52, weight  140 pounds.  GENERAL: He appeared somewhat overly chronically ill and is using a cane to  walk. He has thoracic kyphosis. Sclerae are anicteric.  LUNGS: Decreased breath sounds.  COR with regular S1 and S2.  ABDOMEN: Soft, nontender with normal active bowel sounds. Some tympany in  upper abdomen. Lower abdomen was unremarkable. No pulsations.  RECTAL: Trace hemoccult-positive stool.   IMPRESSION:  1. This is an 77 year old white male with trace hemoccult-positive stool,      chronic anemia, question of small bowel mass.  2. Iron deficiency anemia by history.  3. Patient has been on Coumadin for atrial fibrillation. He is due for      more cardiac evaluation per Dr. Katrinka Gill in the near future.  4. History of  cardioversion in May 2005.  5. History of peripheral neuropathy __________   PLAN:  1. Obtain lab results from Dr. Artis Gill done two weeks ago.  2. CAT scan of the abdomen with attention to the small bowel and abdomen      to rule out recurrent mass or enlarging of the mass.  3. Review old Gill as to when he had his last colonoscopy to avoid his      diarrhea as well as his heme-positive stool.       [name]. Edward Chance, MD      [name]/MedQ  DD:  03/25/2006  DT:  03/27/2006  Job #:  [id number]   cc:   [name], M.D.  [name], M.D.

## 2010-10-30 NOTE — Discharge Summary (Signed)
[name], [name]                            ACCOUNT NO.:  [id number]   MEDICAL RECORD NO.:  [id number]                   PATIENT TYPE:  INP   LOCATION:  0348                                 FACILITY:  [facility]   PHYSICIAN:  Wilhemina Bonito. Marina Goodell, M.D. [facility]             DATE OF BIRTH:  1922/09/09   DATE OF ADMISSION:  01/24/2004  DATE OF DISCHARGE:  01/27/2004                                 DISCHARGE SUMMARY   ADMITTING DIAGNOSES:  31. A 77 year old male with slow gastrointestinal bleed, melena, and chronic     anticoagulation, suspect peptic ulcer disease or erosive gastritis.  2. Anemia secondary to #1.  3. Weakness secondary to #1.  4. Remote history of colon polyps and diverticulosis.  5. Chronic anticoagulation secondary to paroxysmal atrial fibrillation.  6. Status post cardioversion in May 2005.  7. History of peripheral neuropathy with falls.  8. Corneal disease and glaucoma.   DISCHARGE DIAGNOSES:  1. Slow gastrointestinal bleed secondary to erosive gastritis in an     anticoagulated patient.  2. Anemia requiring transfusion secondary to above.  3. History of paroxysmal atrial fibrillation with anticoagulation with     aspirin and Coumadin.  4. Weakness secondary to #1.  5. Remote history of colon polyps and diverticulosis.  6. Status post cardioversion in May 2005.  7. History of peripheral neuropathy with falls.  8. Corneal disease and glaucoma.  9. Iron-deficiency anemia.   CONSULTATIONS:  None.   PROCEDURES:  Upper endoscopy, January 26, 2004.   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  At  the time of his admission, he is known to Dr. Penni Bombard and Dr. Sharrell Ku  with history as described above.  He was found to be anemic earlier on the  week of admission with hemoglobin of 9.8.  This was repeated on January 24, 2004, and was with a hemoglobin of 8.  Gastrointestinal was called and  arrangements were made for the patient to be admitted for  further diagnostic  evaluation.  He had prior colonoscopy with Dr. Virginia Rochester approximately 3 years ago  which was negative with the exception of diverticula.  He has never had an  endoscopy or previous peptic ulcer disease.  He has noted dark stools over  the past week and complains of progressive weakness.  He has no complaint of  abdominal pain at the time of admission, nausea, vomiting, etc.  He has had  recent problems with constipation and has been using MiraLax at home over  the past week with good result.  He relates having approximately 2 bowel  movements per day of a chocolate brown stool over the past several days.   LABORATORY DATA:  Laboratory studies on admission with WBC of 5.9,  hemoglobin 8.4, hematocrit of 24.7, MCV of 89, platelets 356.  On August 13,  hemoglobin 10.9, hematocrit of 32.0.  On August 14, hemoglobin 9.7,  hematocrit of 28.5.  On August 15, hemoglobin 9.4, hematocrit of 27.5.  Pro  time on admission 23.9, INR of 2.9, PTT of 45.  On August 14, pro time 19.4,  INR of 2.  On August 15, pro time 16.9 and INR of 1.6.  Sodium was 126,  potassium 3.8.  Glucose 145.  Liver function studies within normal limits.  Albumin 3.3.  Serum iron 18.  TIBC of 376, iron saturation of 0.5.   HOSPITAL COURSE:  The patient was admitted to the service of Dr. Lina Sar.  He had baseline laboratories drawn.  He was transfused initially  two units of packed RBC's with good response.  He continued to have very  slow evidence of bleeding with one bowel movement per day.  Upper endoscopy  was done on August 14, after his INR had drifted appropriately and he was  found to have antral gastritis with several erosions.  Biopsies were taken  for CLO-test.  These were negative.  It was felt that his bleeding was  secondary to his gastropathy which was likely secondary to aspirin.  He did  not require any further transfusions, felt much better after the initial two  units and on August 15, was  allowed discharge to home with a hemoglobin of  9.4.  A decision was made to leave him off of his aspirin and Coumadin until  he could be seen in followup by Dr. Sharrell Ku, to discuss his options.  An  appointment was made for him on August 29, at 10:45 a.m., with Dr. Ladona Ridgel,  to follow up with Dr. Juanda Chance as needed.  He had been given Chromagen by Dr.  Penni Bombard and this will be continued b.i.d. for the time being.  He will need  followup iron studies in approximately 3 months.  We have also asked him to  have his hemoglobin and hematocrit checked on August 17, and again on February 03, 2004, to assure that his hemoglobin remains above 9.   DISCHARGE MEDICATIONS:  Other medications, all as previous, and have added  Protonix 40 mg p.o. q.a.m.  If he does go back on aspirin and Coumadin,  would leave him on Protonix 40 mg q.a.m. indefinitely.   CONDITION ON DISCHARGE:  Stable.     [name] Esterwood, P.A.-C. [facility]                John N. Marina Goodell, M.D. [facility]    AE/MEDQ  D:  01/27/2004  T:  01/27/2004  Job:  [id number]   cc:   Doylene Canning. Ladona Ridgel, M.D.   Dr. Penni Bombard

## 2010-10-30 NOTE — Discharge Summary (Signed)
NAMEMARISA, [name]                ACCOUNT NO.:  [id number]   MEDICAL RECORD NO.:  [id number]          PATIENT TYPE:  OIB   LOCATION:  2854                         FACILITY:  [facility]   PHYSICIAN:  Maple Mirza, P.A. DATE OF BIRTH:  11-Nov-1922   DATE OF ADMISSION:  06/03/2005  DATE OF DISCHARGE:  06/03/2005                                 DISCHARGE SUMMARY   REASON FOR ADMISSION:  DC cardioversion.   ALLERGIES:  PENICILLIN.   PRINCIPAL DIAGNOSES:  1.  Symptomatic paroxysmal atrial fibrillation difficult to control.  2.  Presented June 03, 2005, for a DC cardioversion and found to be in      sinus rhythm, procedure cancelled.  3.  Elevated TSH found on April 25, 2005, TSH study.   SECONDARY DIAGNOSES:  1.  Aortic stenosis.  2.  Posterior circulation disease.  3.  Incontinence, better with Detrol.  4.  Apparently, per patient and wife, hyperdynamic cardiac output with above-      normal ejection fraction on echocardiogram.   BRIEF HISTORY:  [name] is an 77 year old male with a history of paroxysmal  atrial fibrillation.  It is symptomatic.  He gets short of breath, and his  chronic dizziness worsens when he is in atrial fibrillation.  In addition,  his rate proves difficult to control.  The patient was started on Amiodarone  in September of 2006 at a low as possible dose but had recurrence on  November 12th.  He was seen in the office December 5th by Dr. Ladona Ridgel with up  titration of his Amiodarone to 200 mg 3 times daily.  The patient and his  wife state that since his up titration, his dizziness has increased  dramatically such that he has a very hard time with balance even for short  distances and short times erect.  The plan at the December 5th visit was for  the patient to take the higher dose of Amiodarone and then to present on  December 21st for a DC cardioversion.  This was not necessary secondary to  the fact that the patient was in sinus rhythm but as  mentioned above still  complaining of increased dizziness.  Because liver function studies had not  been obtained since the Amiodarone inception, a CMET was obtained.  These  show just a mildly elevated liver function study; SGOT was 49, SGPT 43, and  alkaline phosphatase 46.  A TSH was also ordered, but this study is pending.  However, looking back on his November 12th visit to [facility], TSH  was obtained and it was 22.044.  The patient therefore referred to Dr. Artis Flock  for management of this.  The patient also has followup with Dr. Artis Flock on  December 22nd at 9 o'clock, and he will see Dr. Ladona Ridgel, January 8th, at  11:15.   MEDICATIONS:  1.  Coumadin 5 mg Monday through Friday, and 2.5 mg Saturday and Sunday.  2.  Fosamax 70 mg weekly.  3.  Meclizine 25 mg t.i.d.  The patient is now taking this fairly      religiously due  to increased dizziness.  4.  Restoril 15 mg at bedtime.  5.  Amiodarone 200 mg t.i.d.  6.  Detrol-LA 4 mg daily.  7.  Ativan 0.5 mg daily.  8.  Flexeril 5 mg as needed.  9.  Vicodin 5/500 as needed.  10. Lasix 20 mg daily as needed.  11. Restasis 0.05% ophthalmic solution 1 drop every 12 hours in both eyes.  12. The patient also takes a vitamin called Macular Protect Complete.  13. Calcium 2 tabs daily.  14. Fish oil 2 tabs daily.  15. Multivitamin daily.  16. Vitamin E and C daily.  17. Folic acid 1 daily.  18. He takes ibuprofen and Tylenol as needed.      Maple Mirza, P.A.     GM/MEDQ  D:  06/03/2005  T:  06/05/2005  Job:  [id number]   cc:   Quita Skye. Artis Flock, M.D.  Fax: [phone]

## 2010-10-30 NOTE — Discharge Summary (Signed)
[name], [name]                            ACCOUNT NO.:  [id number]   MEDICAL RECORD NO.:  [id number]                   PATIENT TYPE:  INP   LOCATION:  4740                                 FACILITY:  [facility]   PHYSICIAN:  Doylene Canning. Ladona Ridgel, M.D.               DATE OF BIRTH:  May 30, 1923   DATE OF ADMISSION:  DATE OF DISCHARGE:  11/15/2003                                 DISCHARGE SUMMARY   PRIMARY DIAGNOSES:  Persistent atrial fibrillation.   SECONDARY DIAGNOSES:  1. Paroxysmal supraventricular tachycardia and atrial fibrillation.  2. History of falls.  3. Peripheral neuropathy.  4. Glaucoma.   HISTORY OF PRESENT ILLNESS:  A 77 year old male with a history of paroxysmal  SVT, atrial fibrillation who had been managed conservatively in the past.  He has a history of falls.  No Coumadin therapy had been initiated.  The  falls are secondary to peripheral neuropathy.  He is also intolerant of beta-  blockers.  He complained of dizziness.  On meclizine p.r.n.  He has been  seen by Dr. __________in the past who thought syncope was multifactorial and  not related to atrial fibrillation.  He had a tilt-test done at [facility] where he spontaneously went into atrial fibrillation after a head-  up position, for 30 minutes before Isuprel.  He was admitted with atrial  fibrillation with rates in the 130's and shortness of breath.  He has been  feeling fatigued for the past three to four weeks with dyspnea on exertion.  Dr. Ladona Ridgel had discussed in the past the possibility of ablation versus drug  therapy, anticoagulation.   HOSPITAL COURSE:  The patient was admitted and had an electrophysiology  consultation.  He was seen by Dr. Ladona Ridgel.  The patient was then started on  Coumadin therapy.  He had a stress Cardiolite performed which showed no  ischemia and an ejection fraction of 57%, inferior fixed defect possibly  diaphragmatic attenuation.  The patient was then started on propafenone  as  well as Lasix.  Rhythmol dose was increased on Nov 11, 2003.  The patient  had a BNP checked on Nov 11, 2003 which was 272.3.  The patient continued on  Rhythmol.  He was placed on a fluid restriction.  The patient was also  hyponatremic which resulted in diuretic therapy as well as fluid  restriction.  The patient underwent a DC cardioversion on June 1, which he  converted to normal sinus rhythm later that day.  He also had a pause of 1.6  seconds.  His Rhythmol was held.  That evening on June 2, early a.m., around  3:30, the patient converted back to atrial fibrillation.  He was then seen  by Dr. Lewayne Bunting.  The Rhythmol was discontinued.  He was started on  amiodarone, and discharged the following day in stable condition.   DISCHARGE LABORATORY DATA:  The patient's  INR at discharge was 2.0.  His  BMET:  Sodium 132, potassium 4.0, chloride 101, cO2 26, glucose 97, BUN 15,  creatinine 0.6.   DISCHARGE MEDICATIONS:  The patient was discharged to home on the following  medications:  1. Multivitamin a day.  2. Aspirin 325 a day.  3. Coumadin 5 mg, Monday, Wednesday and Friday with 2.5 mg Saturday, Sunday,     Tuesday, Thursday.  4. Citrucel one pack a day.  5. Senokot two tablets nightly.  6. Lasix 40 a day.  7. Cardizem CD, 120 a day.  8. Amiodarone 200 mg two tablets every eight hours for four more days; then     20 0 mg, two tablets b.i.d. for two weeks; then 200 mg, two tablets daily.  9. Cortizone cream to his burns three times a day as needed.  10.      Low-fat, low-cholesterol diet.   FOLLOWUP:  1. The patient was to follow with Dr. Ladona Ridgel, July 12, at 12 noon.  At that     time, he was to be set up for DC cardioversion after being on amiodarone     for four to six weeks.  2. He was to be seen at the Coumadin clinic at [facility], June 6, at 10:30     a.m.  3. Also, he was to be scheduled for pulmonary functions, LFT's as well as a     check of his potassium next week.   The office will call to schedule that     appointment.      Chinita Pester, C.R.N.P. [facility]                 Doylene Canning. Ladona Ridgel, M.D.    DS/MEDQ  D:  11/15/2003  T:  11/17/2003  Job:  [id number]   cc:   Doylene Canning. Ladona Ridgel, M.D.   Rodolph Bong, M.D.  [address] [place], [place] [address]  Fax: [phone]

## 2010-10-30 NOTE — H&P (Signed)
[name], [name] NO.:  [id number]   MEDICAL RECORD NO.:  [id number]                   PATIENT TYPE:  INP   LOCATION:  0348                                 FACILITY:  [facility]   PHYSICIAN:  Lina Sar, M.D. [facility]               DATE OF BIRTH:  [birth year]   DATE OF ADMISSION:  01/24/2004  DATE OF DISCHARGE:                                HISTORY & PHYSICAL   CHIEF COMPLAINT:  Weakness and anemia.   HISTORY:  Mr. Schwertner is a pleasant 77 year old white male primary patient of  Dr. Artis Flock known to Dr. Sharrell Ku with history of paroxysmal atrial  fibrillation.  He has been maintained on Coumadin and aspirin.  He was last  admitted in May of 2005 at which time he had palpitations and shortness of  breath and this was felt secondary to atrial fibrillation.  He was  cardioverted on November 13, 2003.  He has been stable since that time, initially  he had been placed on amiodarone which has since been discontinued. His  other medical problems include peripheral neuropathy and glaucoma.  He has  complained of chronic dizziness for some time felt secondary to vertigo and  is on meclizine for this.  He was found to be anemic by Dr. Artis Flock a few days  prior to this admission with a hemoglobin of 9.8. His hemoglobin was  repeated on January 24, 2004 the day of admission and was down to 8. He had  been scheduled to see Dr. Arlyce Dice as an outpatient but with drop in his  hemoglobin, Dr. Artis Flock called and it was felt that admission was warranted  for further diagnostic workup. Review of his old records shows that he had a  colonoscopy by Dr. Virginia Rochester in March of 2002 which was done for history of colon  polyps and heme positive stool. He was found to have scattered diverticula  but no polyps.   The patient relates that he has had progressive weakness over the past  several days, he has not had any falls, he does feel that he has had an  increase in his dizziness as well. He denies  any chest pain and has had some  increased shortness of breath with exertion. He denies any abdominal  discomfort, his appetite has been fine, he has had no nausea or vomiting. He  has noted that his stools have been very dark over the past week without any  gross blood. He has been having difficulty with constipation since May of  2005 and has been on MiraLax over the past week with good results.   The patient is admitted at this time with anemia and evidence for slow GI  bleed on Coumadin and aspirin for further diagnostic evaluation.   CURRENT MEDICATIONS:  1. Lorazepam 0.5 q.d. p.r.n.  2. Acular eye drops t.i.d.  3. Azopt eye drops b.i.d.  4.  Restasis eye drops b.i.d.  5. Fosamax 70 mg q. week.  6. Meclizine 2.5 t.i.d. p.r.n. dizziness.  7. Restoril 15 mg q.h.s.  8. MiraLax 17 g and 8 ounces of water daily.  9. He just started on Chromagen 70 mg b.i.d.  10.      Aspirin 81 mg daily.  11.      Coumadin 5 mg daily and 2.5 mg Monday, Wednesday and Friday.  12.      Senokot 2 p.o. q.h.s.  13.      Lasix 40 q.d. p.r.n.   ALLERGIES:  PENICILLIN which causes anaphylaxis.   PAST MEDICAL HISTORY:  1. Pertinent for paroxysmal atrial fibrillation and SVT with cardioversion     June 1 of 2005.  2. Peripheral neuropathy.  3. Vertigo.  4. Glaucoma.   SOCIAL HISTORY:  The patient is retired, married, he has been a nonsmoker  over the past 30 years, no ETOH.   FAMILY HISTORY:  Noncontributory.   REVIEW OF SYMPTOMS:  Pertinent for increased dyspnea with exertion over the  past week, no chest pain, chronic dizziness is outlined above. He has also  had some recent vision problems with corneal disease and has been followed  at [facility].   PHYSICAL EXAMINATION:  GENERAL:  Well-developed, elderly white male in no  acute distress. He is pleasant.  VITAL SIGNS:  Blood pressure is 137/57, pulse 57, temperature 98.2.  HEENT:  Normocephalic, atraumatic.  EOMI, PERRLA, sclera anicteric. There is   no JVD. He does wear glasses.  CARDIOVASCULAR:  Regular rate and rhythm with S1 and S2. There is an  occasional ectopic.  PULMONARY:  Clear to A&P.  ABDOMEN:  Soft and nontender. There is no mass or hepatosplenomegaly. Bowel  sounds are active.  RECTAL:  Not done at this time.  EXTREMITIES:  Without cyanosis, clubbing or edema.  Pulses are equal.   LABORATORY DATA:  Laboratory studies on admission show a WBC of 5.9,  hemoglobin 8.4, hematocrit of 24.7, pro time 23.9, INR of 2.9 and PTT of 45.   IMPRESSION:  27. A 77 year old white male with slow GI bleed with secondary melena in     patient on aspirin and Coumadin. Suspect peptic ulcer disease or erosive     gastritis. The patient did have a negative colonoscopy in 2002 with the     exception of diverticula.  2. Remote history of colon polyps and diverticular disease.  3. Anemia secondary to #1.  4. Weakness secondary to #1.  5. Chronic anticoagulation.  6. History of paroxysmal atrial fibrillation with cardioversion May of 2005.  7. Glaucoma and corneal disease.  8. History of peripheral neuropathy with falls.   PLAN:  The patient is admitted to the service of Dr. Lina Sar for IV  fluid hydration. Will check baseline labs, transfuse 2 units of packed RBC's  initially, hold aspirin and Coumadin and scheduled upper endoscopy within  the next 24-48 hours depending on his INR. For details please sees the  orders.     Mike Gip, P.A.-C. [facility]                Lina Sar, M.D. [facility]    AE/MEDQ  D:  01/27/2004  T:  01/27/2004  Job:  [id number]   cc:   Doylene Canning. Ladona Ridgel, M.D.   Quita Skye Artis Flock, M.D.  [address], [address]  [place]  [place] [address]  Fax: [phone]

## 2010-10-30 NOTE — Procedures (Signed)
[facility]  Patient:    [name], [name]                         MRN: [id number] Proc. Date: 08/18/00 Adm. Date:  [id number] Attending:  Sabino Gasser CC:         Quita Skye. Artis Flock, M.D.   Procedure Report  PROCEDURE:  Colonoscopy.  INDICATIONS:  See my dictated note.  History of polyps.  Hemoccult positivity.  ANESTHESIA:  Demerol 50 mg, Versed 5 mg.  DESCRIPTION OF PROCEDURE:  With the patient mildly sedated in the left lateral decubitus position, subsequently rolled to his back and his right side; first a rectal examination was performed which was unremarkable.  Subsequently the Olympus videoscopic colonoscope was inserted into the rectum and passed under direct vision to the cecum, identified by the ileocecal valve and appendiceal orifice.  Prep was fairly good.  From this point, the colonoscope was slowly withdrawn taking circumferential views of the entire colonic mucosa, stopping only in the rectum which appeared normal under direct view and in retroflexed view.  The endoscope was then straightened and withdrawn.  The patients vital signs and pulse oximeter remained stable.  The patient tolerated the procedure well without apparent complications.  FINDINGS:  Scattered diverticulum throughout the colon, but otherwise unremarkable colonoscopic examination to the cecum.  Of note, the patient received preoperative antibiotics because of the recent knee surgery. DD:  08/18/00 TD:  08/18/00 Job: [id number]

## 2010-10-30 NOTE — Assessment & Plan Note (Signed)
[facility]                         GASTROENTEROLOGY OFFICE NOTE   NAME:[name], [name]                       MRN:          [id number]  DATE:06/21/2006                            DOB:          02/14/1923    [name] is an 77 year old gentleman who has chronic GI blood loss of  undetermined etiology status post GI workups in the past.  He also has  diarrhea and fecal  incontinence in the mornings.  He had an abnormal  small bowel capsule endoscopy in September of 2005 raising a question of  small bowel mass.  This, however, did not show on small bowel follow  through done in September of 2005 and also did not show on the most  recent CT scan of the abdomen and pelvis done by me in October of 2007.  He, however, continues to be Hemoccult positive.  The last colonoscopy,  as far as I know, was done by Dr. Virginia Rochester in 2003, but the patient says he  might have had a colonoscopy in October of 2007 while  at [facility].  The consultation at [facility] was actually arranged by Dr. Katrinka Blazing for  ablation of his arrhythmia and for placement of pacemaker.  His doctor  was Dr. Sharol Harness.   He comes today with complaints of nocturnal incontinence and diarrhea.  He has taken Levsin sublingually twice a day with improvement of his  symptoms.  His last hemoglobin was 13.   PHYSICAL EXAM:  Blood pressure 100/70, pulse 68, weight 144 pounds,  which represents 5 pound weight gain since last appointment.  He moves around with a cane.  LUNGS:  Clear to auscultation.  COR:  Normal S1, normal S2.  ABDOMEN:  Soft with normoactive bowel sounds.  No distension.  No  tenderness.  RECTAL:  Trace Hemoccult positive stool.   IMPRESSION:  1. An 77 year old gentleman with chronic low grade gastrointestinal      blood loss of unknown etiology.  2. Negative upper endoscopy in 2005.  3. Questionably abnormal small bowel capsule endoscopy raising a      question of a lesion in the  jejunum, but not confirmed on CT scan      of the abdomen or small bowel follow through.  4. Diarrhea.  May be related to diverticulosis, irritable bowel      syndrome, or possibly other causes.  That may have to be further      evaluated.  I doubt that he is dealing with inflammatory bowel      disease.   PLAN:  1. Change to Robinul Forte 2 mg p.o. b.i.d., which is somewhat      stronger than Levsin sublingually.  2. Review records from [facility].  If he did not have a colonoscopy,      consider colonoscopy.  3. Possibly repeating small bowel capsule to follow up on the      abnormality found 3 years ago.      We will, after reviewing the [facility] medical records, we will      determine if he needs further diagnostic  workup.     Hedwig Morton. Juanda Chance, MD  Electronically Signed    DMB/MedQ  DD: 06/21/2006  DT: 06/21/2006  Job #: [id number]   cc:   Quita Skye. Artis Flock, M.D.  Lyn Records, M.D.

## 2010-10-30 NOTE — Cardiovascular Report (Signed)
NAMEBRISON, FIUMARA NO.:  [id number]   MEDICAL RECORD NO.:  [id number]          PATIENT TYPE:  OIB   LOCATION:  2852                         FACILITY:  [facility]   PHYSICIAN:  Lyn Records, M.D.   DATE OF BIRTH:  [birth year]   DATE OF PROCEDURE:  09/23/2005  DATE OF DISCHARGE:                              CARDIAC CATHETERIZATION   INDICATION:  Atrial flutter with rapid ventricular response and exertional  fatigue and dyspnea.   PROCEDURE PERFORMED:  Elective electrical cardioversion.   DESCRIPTION:  After informed consent, the patient was seen and given 130 mg  of IV sodium pentothal by Dr. Earl Lites Smith's direction.   With an anterior and posterior pad orientation we delivered a single  discharge of 100 watt-seconds with reversion to normal sinus rhythm.  Within  10 seconds of conversion, the patient redeveloped atrial flutter.  We did an  additional 100 watt-second discharge with reversion back to normal sinus  rhythm and this time he maintained this rhythm.   He awakened post procedure without neurological sequelae.   CONCLUSIONS:  Successful conversion from atrial flutter to normal sinus  rhythm.   PLAN:  Propafenone dose will be increased to 325 mg p.o. b.i.d.  Home later  today.  Topical cortisone for skin irritation.  Followup in office in 1 week  for EKG.      Lyn Records, M.D.  Electronically Signed     HWS/MEDQ  D:  09/23/2005  T:  09/23/2005  Job:  [id number]   cc:   Quita Skye. Artis Flock, M.D.  Fax: [phone]

## 2010-10-30 NOTE — Op Note (Signed)
[name], [name]                ACCOUNT NO.:  [id number]   MEDICAL RECORD NO.:  [id number]          PATIENT TYPE:  AMB   LOCATION:  ENDO                         FACILITY:  [facility]   PHYSICIAN:  Georgiana Spinner, M.D.    DATE OF BIRTH:  [birth year]   DATE OF PROCEDURE:  DATE OF DISCHARGE:                               OPERATIVE REPORT   ADDENDUM:  Of note, the polyp was not found in the suction material.           ______________________________  Georgiana Spinner, M.D.     GMO/MEDQ  D:  08/25/2006  T:  08/25/2006  Job:  [id number]

## 2010-10-30 NOTE — Op Note (Signed)
NAMEJERRARD, BRADBURN                            ACCOUNT NO.:  [id number]   MEDICAL RECORD NO.:  [id number]                   PATIENT TYPE:  INP   LOCATION:  4740                                 FACILITY:  [facility]   PHYSICIAN:  Doylene Canning. Ladona Ridgel, M.D.               DATE OF BIRTH:  07/03/1922   DATE OF PROCEDURE:  11/13/2003  DATE OF DISCHARGE:                                 OPERATIVE REPORT   PROCEDURE PERFORMED:  Direct current cardioversion.   INDICATIONS:  Persistent atrial fibrillation.   INTRODUCTION:  The patient is a 77 year old man who presented to the  hospital with a several-hour history of tachypalpitations and shortness of  breath and was found to be in atrial fibrillation.  He spontaneously  converted to sinus rhythm while he was being loaded with heparin and  Coumadin; however, the patient spontaneously returned back to atrial  fibrillation despite medical therapy and is now referred for DC  cardioversion.  His INR has been therapeutic.   PROCEDURE:  After informed consent was obtained, the patient was taken to  the diagnostic catheterization lab in the fasted state.  After the usual  preparation, intravenous fentanyl and midazolam were given for sedation.  After the patient was adequately sedated, a 300-joule biphasic synchronized  shock was delivered, resulting in failure to terminate atrial fibrillation.  Several minutes were allowed to elapse and a 360-joule synchronized biphasic  shock was delivered, which terminated atrial fibrillation and restored sinus  rhythm.  However, the patient had early return of atrial fibrillation.  Next  a second 360-joule shock was delivered which terminated atrial fibrillation  and restored sinus rhythm.  The patient maintained sinus rhythm during the  waiting period after the procedure was over.  He was returned to his room in  satisfactory condition.   COMPLICATIONS:  There were no immediate procedural complications.   RESULTS:  This  demonstrates successful DC cardioversion in a patient with  persistent atrial fibrillation.                                               Doylene Canning. Ladona Ridgel, M.D.    GWT/MEDQ  D:  11/13/2003  T:  11/13/2003  Job:  [id number]

## 2010-10-30 NOTE — Consult Note (Signed)
NAMEVINCIENT, VANAMAN NO.:  [id number]   MEDICAL RECORD NO.:  [id number]         PATIENT TYPE:  LAMB   LOCATION:                               FACILITY:  [facility]   PHYSICIAN:  Lina Sar, M.D. [facility]       DATE OF BIRTH:   DATE OF CONSULTATION:  02/25/4  DATE OF DISCHARGE:                                   CONSULTATION   CAPSULE ENDOSCOPY   INDICATIONS:  This 77 year old white male has had persistent upper GI blood  loss which has been fully evaluated with upper and lower endoscopy without  any definitive diagnosis.  He is undergoing capsule endoscopy to look for a  bleeding site within the small bowel.   RESULTS:  Positive blood indicator was found in the duodenal images at 13  minutes 28 seconds.  At 15 minutes into the capsule endoscopy an  intraluminal mass was seen on at least three separate images which had a  dark, somewhat erythematous appearance.  It was mildly lobular and appeared  to be not attached to any side of the small-bowel wall.  The base of the  mass was not actually visualized on the available views.  This mass was  filling up the small-bowel lumen.   Another small lesion was found at the distal small bowel.  This was a polyp  versus a lymphoid follicle.  It measured about 2 or 3 mm and no blood  indicator was seen at that point.   IMPRESSION:  Positive small-bowel capsule endoscopy, indicating intraluminal  mass effect in the proximal small bowel with positive blood indicator.  This  may be the cause of the patient's upper GI blood loss.   PLAN:  The patient will be seen in the office to discuss the findings.  Further evaluation is warranted with possible enteroscopy, small-bowel  follow-through, or even intraoperative enteroscopy.      DB/MEDQ  D:  03/02/2004  T:  03/03/2004  Job:  [id number]

## 2010-10-30 NOTE — H&P (Signed)
NAMEDALLEN, BUNTE NO.:  [id number]   MEDICAL RECORD NO.:  [id number]                   PATIENT TYPE:  INP   LOCATION:  1824                                 FACILITY:  [facility]   PHYSICIAN:  Jonelle Sidle, M.D. [facility]        DATE OF BIRTH:  01-07-1923   DATE OF ADMISSION:  11/05/2003  DATE OF DISCHARGE:                                HISTORY & PHYSICAL   PRIMARY CARE PHYSICIAN:  Rodolph Bong, M.D.   CARDIOLOGIST:  Doylene Canning. Ladona Ridgel, M.D.   CHIEF COMPLAINT:  Palpitations and shortness of breath.   HISTORY OF PRESENT ILLNESS:  The patient is a pleasant 77 year old male with  a history of paroxysmal supraventricular tachycardia/atrial fibrillation  that has been managed conservatively.  He has a history of falls and has not  been on Coumadin long term and did not tolerate beta blocker therapy in the  past.  He therefore has been largely observed.  He has a history of episodic  dizziness not necessarily associated with palpitations.  He was referred to  the emergency department by Dr. Penni Bombard with complaints of palpitations and  shortness of breath and findings of coarse atrial fibrillation versus atrial  tachycardia at 116 beats per minute on electrocardiogram.  He has been given  Cardizem intravenously and his heart rate is beginning to slow. He  apparently developed palpitations and shortness of breath yesterday while  ambulating and has had problems overnight up to this point. His wife also  states that he has been fatigued over the last three weeks.  He has had no  significant chest pain.   ALLERGIES:  PENICILLIN.   MEDICATIONS:  1. Meclizine 25 mg p.o. t.i.d. p.r.n.  2. Aspirin 325 mg p.o. daily.  3. Fosamax 70 mg q. week.  4. Calcium, vitamin E, vitamin C and fish oil supplements.  5. Timolol eye drops.  6. Restasis as directed.   PAST MEDICAL HISTORY:  1. Documented history of paroxysmal supraventricular tachycardia and atrial  fibrillation.  This has been managed by observation most recently.     Clinic notes from Dr. Ladona Ridgel are included in the chart spanning back from     September and November of 2004.  2. History of falls.  Chronic Coumadin therapy has not been instituted.  3. Peripheral neuropathy.  4. Glaucoma.  5. No clear history of coronary artery disease or myocardial infarction.   SOCIAL HISTORY:  The patient is married and lives in [place].  He has a  prior history of tobacco use but quit 32 years ago.  There is no significant  alcohol use.   FAMILY HISTORY:  Noncontributory at present.   REVIEW OF SYMPTOMS:  As per history of present illness.  He has had no  problems with orthopnea, syncope recently, peripheral edema, chest pain,  melena or hematochezia.   PHYSICAL EXAMINATION:  VITAL SIGNS:  Temperature 97.9 degrees, heart rate  112 to  144 at presentation, respirations 20, blood pressure 106/82, oxygen  saturation 96% on room air.  GENERAL:  This is an elderly male lying nearly supine in no acute distress.  HEENT: Examination of the neck reveals no marked jugular venous distention  or bruits.  No thyromegaly is noted.  LUNGS: Clear with decreased breath sounds throughout.  There is no wheezing  noted.  CARDIAC EXAM:  Irregularly irregular rhythm with a soft systolic murmur and  no S3 gallop or pericardial rub.  ABDOMEN:  Soft with normal active bowel sounds.  RECTAL EXAM:  No obvious hematochezia.  EXTREMITIES:  No pitting edema.  Peripheral pulses are 1+.   DATA:  Chest x-ray is pending.   Electrocardiogram shows what looks to be fairly coarse atrial fibrillation  versus an ectopic atrial tachycardia with variable conduction.  There are  nonspecific ST changes.   LABORATORY DATA:  Pending at this time.   IMPRESSION:  1. Supraventricular tachycardia/atrial fibrillation, paroxysmal by history     with a more prolonged episode that has been symptomatic at least since     yesterday.   The absolute duration is uncertain, but potentially recent.     The patient has not been on Coumadin long term due to a history of     recurrent falls.  2. No clear history of coronary artery disease or myocardial infarction.  3. History of recurrent episodes of dizziness, possibly due to problem #1.   PLAN:  1. At this point, we will admit the patient to the hospital for further     observation and cycle cardiac markers.  We will continue intravenous     Cardizem to attain better rate control and start the patient on     intravenous heparin.  2. A transesophageal echocardiographic guided cardioversion has been     scheduled for tomorrow tentatively.  We will plan to follow-up on TSH     level and remaining blood work in the interim.  3. Consideration regarding anti-arrhythmic therapy can be discussed with     electrophysiology given the patient's recurrent tachyarrhythmias and     symptoms.  This may in fact be something that can be addressed via     ablation.  Further recommendations to follow.                                                Jonelle Sidle, M.D. [facility]    SGM/MEDQ  D:  11/05/2003  T:  11/05/2003  Job:  [id number]

## 2011-01-05 ENCOUNTER — Other Ambulatory Visit: Payer: Self-pay | Admitting: Family Medicine

## 2011-01-05 ENCOUNTER — Ambulatory Visit
Admission: RE | Admit: 2011-01-05 | Discharge: 2011-01-05 | Disposition: A | Discharge status: Home / Self Care | Payer: Medicare Other | Source: Ambulatory Visit | Attending: Family Medicine | Admitting: Family Medicine

## 2011-01-05 DIAGNOSIS — R0609 Other forms of dyspnea: Secondary | ICD-10-CM

## 2011-01-17 ENCOUNTER — Emergency Department (HOSPITAL_COMMUNITY)
Admission: EM | Admit: 2011-01-17 | Discharge: 2011-01-17 | Disposition: A | Discharge status: Home / Self Care | Payer: Medicare Other | Attending: Emergency Medicine | Admitting: Emergency Medicine

## 2011-01-17 ENCOUNTER — Emergency Department (HOSPITAL_COMMUNITY): Payer: Medicare Other

## 2011-01-17 DIAGNOSIS — M129 Arthropathy, unspecified: Secondary | ICD-10-CM | POA: Insufficient documentation

## 2011-01-17 DIAGNOSIS — I4891 Unspecified atrial fibrillation: Secondary | ICD-10-CM | POA: Insufficient documentation

## 2011-01-17 DIAGNOSIS — I2510 Atherosclerotic heart disease of native coronary artery without angina pectoris: Secondary | ICD-10-CM | POA: Insufficient documentation

## 2011-01-17 DIAGNOSIS — R5381 Other malaise: Secondary | ICD-10-CM | POA: Insufficient documentation

## 2011-01-17 DIAGNOSIS — Z951 Presence of aortocoronary bypass graft: Secondary | ICD-10-CM | POA: Insufficient documentation

## 2011-01-17 DIAGNOSIS — F0390 Unspecified dementia without behavioral disturbance: Secondary | ICD-10-CM | POA: Insufficient documentation

## 2011-01-17 DIAGNOSIS — R4182 Altered mental status, unspecified: Secondary | ICD-10-CM | POA: Insufficient documentation

## 2011-01-17 DIAGNOSIS — Z79899 Other long term (current) drug therapy: Secondary | ICD-10-CM | POA: Insufficient documentation

## 2011-01-17 DIAGNOSIS — Z7901 Long term (current) use of anticoagulants: Secondary | ICD-10-CM | POA: Insufficient documentation

## 2011-01-17 DIAGNOSIS — E039 Hypothyroidism, unspecified: Secondary | ICD-10-CM | POA: Insufficient documentation

## 2011-01-17 DIAGNOSIS — R5383 Other fatigue: Secondary | ICD-10-CM | POA: Insufficient documentation

## 2011-01-17 LAB — CBC
Hemoglobin: 11.7 g/dL — ABNORMAL LOW (ref 13.0–17.0)
MCH: 32.8 pg (ref 26.0–34.0)
RBC: 3.57 MIL/uL — ABNORMAL LOW (ref 4.22–5.81)

## 2011-01-17 LAB — DIFFERENTIAL
Basophils Relative: 0 % (ref 0–1)
Lymphocytes Relative: 27 % (ref 12–46)
Monocytes Relative: 13 % — ABNORMAL HIGH (ref 3–12)
Neutro Abs: 2.8 10*3/uL (ref 1.7–7.7)
Neutrophils Relative %: 47 % (ref 43–77)

## 2011-01-17 LAB — BASIC METABOLIC PANEL
CO2: 26 mEq/L (ref 19–32)
Glucose, Bld: 92 mg/dL (ref 70–99)
Potassium: 3.9 mEq/L (ref 3.5–5.1)
Sodium: 133 mEq/L — ABNORMAL LOW (ref 135–145)

## 2011-01-27 ENCOUNTER — Encounter (HOSPITAL_BASED_OUTPATIENT_CLINIC_OR_DEPARTMENT_OTHER): Payer: Medicare Other | Attending: Plastic Surgery

## 2011-01-27 DIAGNOSIS — Z79899 Other long term (current) drug therapy: Secondary | ICD-10-CM | POA: Insufficient documentation

## 2011-01-27 DIAGNOSIS — I4891 Unspecified atrial fibrillation: Secondary | ICD-10-CM | POA: Insufficient documentation

## 2011-01-27 DIAGNOSIS — L97309 Non-pressure chronic ulcer of unspecified ankle with unspecified severity: Secondary | ICD-10-CM | POA: Insufficient documentation

## 2011-01-27 DIAGNOSIS — Z7901 Long term (current) use of anticoagulants: Secondary | ICD-10-CM | POA: Insufficient documentation

## 2011-01-27 DIAGNOSIS — I2789 Other specified pulmonary heart diseases: Secondary | ICD-10-CM | POA: Insufficient documentation

## 2011-01-27 DIAGNOSIS — Z950 Presence of cardiac pacemaker: Secondary | ICD-10-CM | POA: Insufficient documentation

## 2011-01-28 NOTE — Progress Notes (Signed)
[facility]  NAME:  [name], [name]                ACCOUNT NO.:  [id number]  MEDICAL RECORD NO.:  [id number]      DATE OF BIRTH:  10/27/1922  PHYSICIAN:  Wayland Denis, DO       VISIT DATE:  01/27/2011                                  OFFICE VISIT   CHIEF COMPLAINT:  Left lower extremity ulcer.  HISTORY OF PRESENT ILLNESS:  [name] is an 76 year old white male who comes today for evaluation of a left lower extremity ulcer.  It is located on the lateral aspect of the ankle.  He states that it has been present for several weeks now.  His wife, who is present has been using triple antibiotic ointment on the area without improvement.  They are concerned that it could get worse and came for evaluation.  His wife also states that has had a lot of difficulty with appetite and with getting protein in his diet.  He has multiple medical conditions that would make this difficult as well as multiple medications.  MEDICAL HISTORY:  Positive for: 1. Chronic atrial fibrillation for which he takes Coumadin. 2. AV node ablation. 3. He has a pacemaker in place. 4. He has pulmonary hypertension. 5. Mitral regurgitation. 6. Corneal disease. 7. Arthritis. 8. Irritable bowel syndrome. 9. Urinary frequency. 10.Macular degeneration. 11.Fracture of his back.  SURGICAL HISTORY:  Includes: 1. Pacemaker insertion. 2. Eye surgery. 3. Hernia repair. 4. Tonsillectomy. 5. Back surgery.  SOCIAL HISTORY:  The patient is married and lives at home.  He is a former smoker and quit 30 years ago, but has a 40-pack-year history.  He denies any alcohol or recreational drug use.  His daughter is also present on the exam.  He does not drive, but does use his seatbelt.  ALLERGIES:  CONTRAST MEDIA, PENICILLIN, and UROXATRAL.  MEDICATIONS: 1. Enalapril. 2. Temazepam. 3. Synthroid. 4. Spironolactone. 5. Fosamax. 6. MiraLax. 7. Tylenol. 8. Systane. 9. Vitamin  C. 10.Calcium. 11.Enablex. 12.Hyoscyamine. 13.Cetirizine. 14.Restasis. 15.Fish oil. 16.Polyethylene glycol powder. 17.Namenda. 18.Triamcinolone cream. 19.Lactulose. 20.Hydrocortisone. 21.Aricept. 22.TobraDex. 23.Flexeril as needed. 24.Lorazepam. 25.Meclizine. 26.Vicodin. 27.Bactrim. 28.He had been on Keflex. 29.Fentanyl patch. 30.Coumadin.  FAMILY HISTORY:  Noncontributory.  PHYSICAL EXAMINATION:  GENERAL:  The patient is alert and oriented, cooperative, in no acute distress.  He seems to be a good historian, but his wife is very helpful and daughter as well. HEENT:  His pupils are equal.  Extraocular muscles are intact. NECK:  There is no cervical lymphadenopathy. LUNGS:  His breathing is unlabored. HEART:  The rhythm is irregular, but rate is regular. ABDOMEN:  Soft, nontender.  He is thin and certainly not overweight.  He does not have excess adipose on him. 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 Pulses are equal bilaterally.  He has a left lateral ankle wound, superficial at present.  Mild swelling around it and redness, does not appear to be infected.  I recommend Santyl, hydrogel, Adaptic, Kerlix, and Coban with a 1-week followup.  Also, we would like to have vascular take a look at him to see if he needs any intervention, but also so that we know whether or not we can use wraps and compression.     [employer], DO  CS/MEDQ  D:  01/27/2011  T:  01/28/2011  Job:  [id number]

## 2011-02-03 NOTE — Progress Notes (Signed)
[facility]  NAME:  BRADEY, LUZIER NO.:  [id number]  MEDICAL RECORD NO.:  [id number]      DATE OF BIRTH:  [birth year]  PHYSICIAN:  Wayland Denis, DO       VISIT DATE:  02/03/2011                                  OFFICE VISIT   Mr. Novinger is an 77 year old white male who is here for followup with his wife for left lower extremity ankle ulcer.  He was using Santyl and collagen last week with good improvement.  We are also able to check his prealbumin, which is within normal limits.  Overall, he is doing well and pleased with the progress.  There is less pain and overall improvement.  There is no change in his medications or review of systems, which is negative unless otherwise stated.  His social history is the same.  PHYSICAL EXAMINATION:  GENERAL:  He is alert and oriented, cooperative, in no acute distress.  He is pleasant. HEENT:  Pupils are equal.  Extraocular muscles are intact. NECK:  There is no cervical lymphadenopathy. RESPIRATORY:  His breathing is clear. HEART:  Rate is regular. SKIN:  Wound is as described.  The plan is to continue with Santyl, collagen, and have him follow up in a week.     Wayland Denis, DO     CS/MEDQ  D:  02/03/2011  T:  02/03/2011  Job:  [id number]

## 2011-02-10 NOTE — Progress Notes (Signed)
[facility]  NAME:  WYETH, HOFFER NO.:  [id number]  MEDICAL RECORD NO.:  [id number]      DATE OF BIRTH:  [birth year]  PHYSICIAN:  Wayland Denis, DO       VISIT DATE:  02/10/2011                                  OFFICE VISIT   Mr. Nuttall is an 77 year old white male here with his wife for followup on the left lower extremity lateral malleolus ulcer.  He has been using collagen, Santyl with Kerlix and Coban with some improvement.  The wound is granulating and there is no sign of infection or drainage.  The size is noted in the nurse's note.  There has not even fibrous tissue, so overall looks really good.  It is nontender or painful at present. There has been no change in his medications or social history.  Review of systems is negative if not otherwise stated.  On exams, he is alert, oriented, cooperative, in no acute distress, pleasant.  Pupils are equal.  Extraocular muscles are intact.  No cervical lymphadenopathy. Lungs clear.  Heart is regular.  Abdomen is soft.  He is doing a little bit better with his protein shakes and I have encouraged him to continue with that and the multivitamin.  We are going to remove the Santyl and just do the collagen and the wrap and have him follow up in a week.  If he does really well, at this point, I think he would be a good candidate for Oasis and a wrap, so we will see him back in a week.     Wayland Denis, DO     CS/MEDQ  D:  02/10/2011  T:  02/10/2011  Job:  [id number]

## 2011-02-17 ENCOUNTER — Encounter (HOSPITAL_BASED_OUTPATIENT_CLINIC_OR_DEPARTMENT_OTHER): Payer: Medicare Other | Attending: Plastic Surgery

## 2011-02-17 DIAGNOSIS — Z950 Presence of cardiac pacemaker: Secondary | ICD-10-CM | POA: Insufficient documentation

## 2011-02-17 DIAGNOSIS — I4891 Unspecified atrial fibrillation: Secondary | ICD-10-CM | POA: Insufficient documentation

## 2011-02-17 DIAGNOSIS — Z79899 Other long term (current) drug therapy: Secondary | ICD-10-CM | POA: Insufficient documentation

## 2011-02-17 DIAGNOSIS — L97309 Non-pressure chronic ulcer of unspecified ankle with unspecified severity: Secondary | ICD-10-CM | POA: Insufficient documentation

## 2011-02-17 DIAGNOSIS — Z7901 Long term (current) use of anticoagulants: Secondary | ICD-10-CM | POA: Insufficient documentation

## 2011-02-17 DIAGNOSIS — I2789 Other specified pulmonary heart diseases: Secondary | ICD-10-CM | POA: Insufficient documentation

## 2011-02-17 NOTE — Progress Notes (Signed)
[facility]  NAME:  [name], [name]                ACCOUNT NO.:  [id number]  MEDICAL RECORD NO.:  [id number]      DATE OF BIRTH:  April 01, 1923  PHYSICIAN:  Wayland Denis, DO       VISIT DATE:  02/17/2011                                  OFFICE VISIT   HISTORY OF PRESENT ILLNESS:  [name] is an 77 year old white male who is here with his wife for a followup on his left lower extremity ulcer and sacral ulcer.  He has been using Santyl and collagen with very good success.  The wound is less deep and closing in and epithelializing. There is no sign of infection, no drainage but it smells bad.  The sacral wound is more of a scraped from scooting.  His wife says it is really difficult to get him anywhere now and is requesting Home Health which is a good idea.  She is trying to increase his protein and encourage him to eat well.  PHYSICAL EXAMINATION:  GENERAL:  He is alert, oriented, cooperative and in no acute distress.  He is pleasant. EYES:  Pupils are equal.  Extraocular muscles are intact. NECK:  No cervical lymphadenopathy. RESPIRATIONS:  His breathing is unlabored. HEART:  Regular. ABDOMEN:  Soft.  The wounds are as described.  There has been no change in his social history or medical history.  No change in his medications.  We will continue with the collagen on his left lower extremity, but holding the Santyl and then on the sacral area DuoDerm and we will see him back in 1 month and we will have home health come in the interim.     Wayland Denis, DO     CS/MEDQ  D:  02/17/2011  T:  02/17/2011  Job:  [id number]

## 2011-03-01 ENCOUNTER — Other Ambulatory Visit: Payer: Self-pay

## 2011-03-03 ENCOUNTER — Other Ambulatory Visit: Payer: Medicare Other

## 2011-03-06 ENCOUNTER — Inpatient Hospital Stay (HOSPITAL_COMMUNITY)
Admission: EM | Admit: 2011-03-06 | Discharge: 2011-03-11 | DRG: 378 | Disposition: A | Discharge status: Skilled Nursing Facility | Payer: Medicare Other | Attending: Internal Medicine | Admitting: Internal Medicine

## 2011-03-06 DIAGNOSIS — G2581 Restless legs syndrome: Secondary | ICD-10-CM | POA: Diagnosis present

## 2011-03-06 DIAGNOSIS — L97809 Non-pressure chronic ulcer of other part of unspecified lower leg with unspecified severity: Secondary | ICD-10-CM | POA: Diagnosis present

## 2011-03-06 DIAGNOSIS — K5730 Diverticulosis of large intestine without perforation or abscess without bleeding: Secondary | ICD-10-CM | POA: Diagnosis present

## 2011-03-06 DIAGNOSIS — D649 Anemia, unspecified: Secondary | ICD-10-CM | POA: Diagnosis present

## 2011-03-06 DIAGNOSIS — R5381 Other malaise: Secondary | ICD-10-CM | POA: Diagnosis present

## 2011-03-06 DIAGNOSIS — T3695XA Adverse effect of unspecified systemic antibiotic, initial encounter: Secondary | ICD-10-CM | POA: Diagnosis present

## 2011-03-06 DIAGNOSIS — R791 Abnormal coagulation profile: Secondary | ICD-10-CM | POA: Diagnosis present

## 2011-03-06 DIAGNOSIS — K922 Gastrointestinal hemorrhage, unspecified: Principal | ICD-10-CM | POA: Diagnosis present

## 2011-03-06 DIAGNOSIS — H409 Unspecified glaucoma: Secondary | ICD-10-CM | POA: Diagnosis present

## 2011-03-06 DIAGNOSIS — L98499 Non-pressure chronic ulcer of skin of other sites with unspecified severity: Secondary | ICD-10-CM | POA: Diagnosis present

## 2011-03-06 DIAGNOSIS — I4891 Unspecified atrial fibrillation: Secondary | ICD-10-CM | POA: Diagnosis present

## 2011-03-06 DIAGNOSIS — K649 Unspecified hemorrhoids: Secondary | ICD-10-CM | POA: Diagnosis present

## 2011-03-06 DIAGNOSIS — F068 Other specified mental disorders due to known physiological condition: Secondary | ICD-10-CM | POA: Diagnosis present

## 2011-03-06 DIAGNOSIS — R279 Unspecified lack of coordination: Secondary | ICD-10-CM | POA: Diagnosis present

## 2011-03-06 DIAGNOSIS — E236 Other disorders of pituitary gland: Secondary | ICD-10-CM | POA: Diagnosis present

## 2011-03-06 DIAGNOSIS — I2510 Atherosclerotic heart disease of native coronary artery without angina pectoris: Secondary | ICD-10-CM | POA: Diagnosis present

## 2011-03-06 DIAGNOSIS — Z950 Presence of cardiac pacemaker: Secondary | ICD-10-CM

## 2011-03-06 DIAGNOSIS — Z7901 Long term (current) use of anticoagulants: Secondary | ICD-10-CM

## 2011-03-06 DIAGNOSIS — Z880 Allergy status to penicillin: Secondary | ICD-10-CM

## 2011-03-06 DIAGNOSIS — Z79899 Other long term (current) drug therapy: Secondary | ICD-10-CM

## 2011-03-06 DIAGNOSIS — T45515A Adverse effect of anticoagulants, initial encounter: Secondary | ICD-10-CM | POA: Diagnosis present

## 2011-03-06 DIAGNOSIS — R32 Unspecified urinary incontinence: Secondary | ICD-10-CM | POA: Diagnosis present

## 2011-03-06 DIAGNOSIS — H3530 Unspecified macular degeneration: Secondary | ICD-10-CM | POA: Diagnosis present

## 2011-03-06 DIAGNOSIS — E039 Hypothyroidism, unspecified: Secondary | ICD-10-CM | POA: Diagnosis present

## 2011-03-06 LAB — CBC
HCT: 29.4 % — ABNORMAL LOW (ref 39.0–52.0)
HCT: 25.5 % — ABNORMAL LOW (ref 39.0–52.0)
MCH: 31.6 pg (ref 26.0–34.0)
MCH: 31.5 pg (ref 26.0–34.0)
MCHC: 34.4 g/dL (ref 30.0–36.0)
MCV: 91.9 fL (ref 78.0–100.0)
MCV: 91.4 fL (ref 78.0–100.0)
Platelets: 226 10*3/uL (ref 150–400)
RDW: 13.3 % (ref 11.5–15.5)
RDW: 13.1 % (ref 11.5–15.5)

## 2011-03-06 LAB — BASIC METABOLIC PANEL
BUN: 16 mg/dL (ref 6–23)
CO2: 26 mEq/L (ref 19–32)
Calcium: 9.8 mg/dL (ref 8.4–10.5)
Creatinine, Ser: 0.56 mg/dL (ref 0.50–1.35)
Glucose, Bld: 92 mg/dL (ref 70–99)

## 2011-03-06 LAB — OCCULT BLOOD, POC DEVICE: Fecal Occult Bld: POSITIVE

## 2011-03-06 LAB — DIFFERENTIAL
Basophils Absolute: 0 10*3/uL (ref 0.0–0.1)
Basophils Relative: 0 % (ref 0–1)
Eosinophils Absolute: 0.3 10*3/uL (ref 0.0–0.7)
Eosinophils Relative: 4 % (ref 0–5)
Lymphocytes Relative: 19 % (ref 12–46)

## 2011-03-06 LAB — PROTIME-INR: INR: 4.04 — ABNORMAL HIGH (ref 0.00–1.49)

## 2011-03-07 LAB — CBC
HCT: 23.9 % — ABNORMAL LOW (ref 39.0–52.0)
Hemoglobin: 8.7 g/dL — ABNORMAL LOW (ref 13.0–17.0)
MCH: 31.8 pg (ref 26.0–34.0)
MCH: 31.6 pg (ref 26.0–34.0)
MCH: 31.5 pg (ref 26.0–34.0)
MCHC: 34.3 g/dL (ref 30.0–36.0)
MCHC: 34.4 g/dL (ref 30.0–36.0)
Platelets: 204 10*3/uL (ref 150–400)
Platelets: 220 10*3/uL (ref 150–400)
RBC: 2.69 MIL/uL — ABNORMAL LOW (ref 4.22–5.81)
RDW: 13.2 % (ref 11.5–15.5)
RDW: 13.3 % (ref 11.5–15.5)
RDW: 13.3 % (ref 11.5–15.5)
WBC: 4.3 10*3/uL (ref 4.0–10.5)

## 2011-03-07 LAB — PREPARE FRESH FROZEN PLASMA: Unit division: 0

## 2011-03-07 LAB — BASIC METABOLIC PANEL
CO2: 24 mEq/L (ref 19–32)
Calcium: 8.6 mg/dL (ref 8.4–10.5)
Creatinine, Ser: 0.5 mg/dL (ref 0.50–1.35)
GFR calc Af Amer: >60 mL/min (ref >60)
Sodium: 131 mEq/L — ABNORMAL LOW (ref 135–145)

## 2011-03-07 LAB — PROTIME-INR
INR: 2.91 — ABNORMAL HIGH (ref 0.00–1.49)
Prothrombin Time: 30.9 seconds — ABNORMAL HIGH (ref 11.6–15.2)

## 2011-03-08 LAB — CBC
MCV: 92.2 fL (ref 78.0–100.0)
Platelets: 239 10*3/uL (ref 150–400)
RBC: 2.95 MIL/uL — ABNORMAL LOW (ref 4.22–5.81)
RDW: 13 % (ref 11.5–15.5)
WBC: 6.4 10*3/uL (ref 4.0–10.5)

## 2011-03-08 LAB — PROTIME-INR
INR: 3.33 — ABNORMAL HIGH (ref 0.00–1.49)
Prothrombin Time: 34.3 seconds — ABNORMAL HIGH (ref 11.6–15.2)

## 2011-03-09 LAB — BASIC METABOLIC PANEL
Calcium: 8.7 mg/dL (ref 8.4–10.5)
GFR calc Af Amer: >60 mL/min (ref >60)
GFR calc non Af Amer: >60 mL/min (ref >60)
Glucose, Bld: 79 mg/dL (ref 70–99)
Potassium: 3.5 mEq/L (ref 3.5–5.1)
Sodium: 129 mEq/L — ABNORMAL LOW (ref 135–145)

## 2011-03-09 LAB — CBC
MCV: 90.8 fL (ref 78.0–100.0)
Platelets: 241 10*3/uL (ref 150–400)
RBC: 3.05 MIL/uL — ABNORMAL LOW (ref 4.22–5.81)
RDW: 13 % (ref 11.5–15.5)
WBC: 5.6 10*3/uL (ref 4.0–10.5)

## 2011-03-09 LAB — PROTIME-INR
INR: 2.45 — ABNORMAL HIGH (ref 0.00–1.49)
Prothrombin Time: 27 seconds — ABNORMAL HIGH (ref 11.6–15.2)

## 2011-03-09 NOTE — H&P (Signed)
NAMEMIA, WINTHROP NO.:  [id number]  MEDICAL RECORD NO.:  [id number]  LOCATION:  WLED                         FACILITY:  [facility]  PHYSICIAN:  Brendia Sacks, MD    DATE OF BIRTH:  30-Aug-1922  DATE OF ADMISSION:  03/06/2011 DATE OF DISCHARGE:                             HISTORY & PHYSICAL   REFERRING PROVIDER:  Dr. Tiburcio Pea and Mr. Fayrene Helper.  PRIMARY CARE PHYSICIAN:  Dario Guardian, M.D.  PRIMARY CARDIOLOGIST:  Lyn Records, M.D.  PRIMARY GASTROENTEROLOGIST:  Shirley Friar, MD.  PRIMARY NEUROLOGIST:  Genene Churn. Love, M.D.  CHIEF COMPLAINT:  Dark stools.  HISTORY OF PRESENT ILLNESS:  This is an 77 year old man who presented on the advice of his primary care provider to the emergency room for evaluation of dark stools.  History is obtained both from the patient and from his wife who is at bedside.  She reports that he has been generally weak recently, but has been using his walker to walk.  She notes 3 days ago, he developed dark stools.  Of note, the patient is on Coumadin for a history of atrial fibrillation and just recently completed last night, antibiotics for urinary tract infection.  The antibiotic use was Bactrim.  The patient denies any hemoptysis or hematemesis.  He has had no frank bleeding from his rectum.  However, he has had at least six stools per day for the last 3 days, which had been quite dark, but are not described as being tarry by his wife.  He presented to the emergency room where his hemoglobin was found to be lower than his baseline.  GI was consulted.  Dr. Ewing Schlein advised transfusion of FFP and Protonix infusion and he will see in consultation.  The patient denies any pain.  His laboratory studies for his chronic hyponatremia appears to be stable.  His INR was found to be 4.04.  REVIEW OF SYSTEMS:  Negative for fever.  Positive for poor vision which is chronic issue.  Negative for sore throat, rash, or muscle  aches, chest pain, shortness of breath, nausea, or vomiting.  No dysuria, but the patient does have difficulty voiding and wears a condom catheter at night.  No abdominal pain.  PAST MEDICAL HISTORY: 1. Coronary artery disease. 2. Atrial fibrillation, on Coumadin. 3. Hyponatremia. 4. Dementia. 5. Hypothyroidism. 6. Diverticulosis. 7. L2 compression fracture. 8. Glaucoma. 9. Sacral ulcer and left lower extremity ulcer being treated by Dr.     Kelly Splinter at the wound care clinic. 10.Constipation. 11.Macular degeneration. 12.Hemorrhoids. 13.Ataxia.  He uses a walker. 14.Urinary incontinence.  He uses condom catheter at night. 15.Possible obstructive sleep apnea, although he has not had a sleep     test and CPAP has not been recommended to. 16.Restless legs syndrome.  PAST SURGICAL HISTORY: 1. Pacemaker placement. 2. Hernia repair in 1973. 3. Cardioversion twice in 2005 and 2007. 4. L2 kyphoplasty. 5. Bilateral cataract extractions. 6. Right knee chondroplasty and tricompartmental synovectomy.  ALLERGIES:  To PENICILLIN which causes hives.SOCIAL HISTORY:  He is a nonsmoker, nondrinker.  He lives with his wife. He has home health nursing that comes on twice per week.  FAMILY HISTORY:  Mother died of a bleeding ulcer.  MEDICATIONS: 1. Hemorrhoid cream q.i.d. as needed. 2. Polytrim both eyes six times daily 1 drop. 3. Coumadin 6 mg Monday, Wednesdays, and Fridays; Tuesdays, Thursday,     Saturday, and Sunday take 5 mg. 4. ReQuip 2 mg p.o. q.h.s. 5. Multivitamin p.o. daily. 6. Fish oil 1 g 2 capsules p.o. daily. 7. Triamcinolone cream applied to skin b.i.d. for skin irritation. 8. Calcium carbonate and vitamin D p.o. b.i.d. 9. Systane 1 drop b.i.d. as needed for dry eyes. 10.Vitamin C 500 mg p.o. daily. 11.Hydrocodone/acetaminophen 5/500 1-1/2 tablet every 6 hours as     needed for pain. 12.Meclizine 25 mg p.o. t.i.d. as needed for dizziness. 13.Lorazepam 0.5 mg p.o. every 8  hours and 2 tablets at bedtime. 14.Flexeril 10 mg p.o. q.h.s. 15.Aricept 5 mg p.o. q.h.s. 16.Namenda 10 mg p.o. b.i.d. 17.Polyethylene glycol 17 g p.o. daily p.r.n. constipation. 18.Spironolactone 25 mg 1-1/2 tablet p.o. daily. 19.Synthroid 88 mcg p.o. daily. 20.Temazepam 15 mg p.o. q.h.s. 21.Fentanyl patch 25 mcg per hour applied every 3 days to skin.  PHYSICAL EXAMINATION:  VITAL SIGNS:  Blood pressure is 122/55, pulse 72, respirations 20, and temperature 97.7. GENERAL:  Well-developed, well-nourished man, in no acute distress.  He is lying flat on the stretcher and appears calm, comfortable.  He wears glasses. EYES:  Notable for slightly eccentric pupil on the left, iris and lids appear unremarkable.  The right eye has a 2-mm pupil and is reactive; the left eye has an eccentric 2 mm pupil.  Hearing is grossly normal. Lips and tongue appear unremarkable.  Neck is supple.  No lymphadenopathy, masses, or thyromegaly. CHEST:  Clear to auscultation bilaterally.  No wheezes, rales, or rhonchi.  There is normal respiratory effort. CARDIOVASCULAR:  Regular rate and rhythm.  No murmur, rub, or gallop. No lower extremity edema. ABDOMEN:  Soft, nontender, and nondistended.  No masses are appreciated. SKIN:  Grossly normal without rash or induration.  His left lower extremity ulcer and sacral ulcer were not visualized.  Tone appears to be grossly unremarkable. PSYCHIATRIC:  Grossly normal mood and affect.  Speech is fluent and appropriate.  LABORATORY STUDIES: 1. CBC notable for a hemoglobin of 10.1.  His baseline appears to be     approximately 11 to 12. 2. INR is 4.04. 3. Sodium is 128 and chloride is 95.  This appears to be near his     baseline. 4. Guaiac was positive.  ASSESSMENT AND PLAN:  An 77 year old man presents with a gastrointestinal bleed. 1. Gastrointestinal bleed.  Favor upper etiology.  May be simply     secondary to warfarin.  He also does have a history of      diverticulosis, but he has had no fresh bleeding.  Dr. Ewing Schlein will     see in consultation and has already advised fresh frozen plasma     which is being performed in the emergency room.  Continue on     Protonix infusion for now.  Warfarin has been discontinued.  He     appears to be hemodynamically stable at this point.  I think it is     reasonable to admit him to a medical bed with telemetry. 2. Coagulopathy secondary to warfarin and complicated by recent     antibiotic therapy.  Plan as above. 3. 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  This     appears to be stable.  Repeat a basic metabolic panel in the  morning. 4. Atrial fibrillation.  This appears to be stable.  Note that he is     not on any rate-controlled agents.  His dementia appears to be     stable and appears to be minimal in nature.  Continue Namenda and     Aricept.  CODE STATUS:  In discussion with the patient and his wife, discussed CPR with them and they wish the patient to be full code.  I discussed the admission and the plan with the patient and his wife and they were in agreement.     Brendia Sacks, MD     DG/MEDQ  D:  03/06/2011  T:  03/06/2011  Job:  [id number]  cc:   Dario Guardian, M.D. Fax: [phone]  Lyn Records, M.D. Fax: [phone]  Shirley Friar, MD Fax: [phone]  Genene Churn. Love, M.D. Fax: [phone]  Electronically Signed by Brendia Sacks  on 03/09/2011 10:43:30 PM

## 2011-03-09 NOTE — Discharge Summary (Signed)
NAMEKARMINE, KAUER NO.:  [id number]  MEDICAL RECORD NO.:  [id number]  LOCATION:  1419                         FACILITY:  [facility]  PHYSICIAN:  Brendia Sacks, MD    DATE OF BIRTH:  March 05, 1923  DATE OF ADMISSION:  03/06/2011 DATE OF DISCHARGE:  03/10/2011                        DISCHARGE SUMMARY - REFERRING  PRIMARY CARE PHYSICIAN:  Dario Guardian, M.D.  PRIMARY CARDIOLOGIST:  Lyn Records, M.D.  PRIMARY GASTROENTEROLOGIST:  Shirley Friar, MD.  PRIMARY NEUROLOGIST:  Genene Churn. Love, M.D.   CONDITION ON DISCHARGE:  Improved.  DISPOSITION:  At this time, the patient is still deciding, but will either be a skilled nursing rehab or home with home health physical therapy.  DISCHARGE DIAGNOSES: 1. Gastrointestinal bleed secondary to coagulopathy. 2. Coagulopathy secondary to warfarin complicated by recent antibiotic     therapy. 3. Atrial fibrillation, stable. 4. Syndrome of inappropriate antidiuretic hormone, stable. 5. Full code.  HISTORY OF PRESENT ILLNESS:  This an 77 year old man who presented to the emergency room for dark stools.  He was found to be anemic and there was concern for GI bleed especially in the context of his coagulopathy. He was started on Protonix infusion and seen in consultation with Gastroenterology and admitted for further evaluation and treatment.  HOSPITAL COURSE:  Mr. Consuegra was admitted to the medical floor and unfortunately had no significant bleeding during this hospitalization. He did not require any blood products.  He was seen in consultation with Gastroenterology, who felt this was most likely secondary to his coagulopathy and did not represent actually a frank GI bleed. Therefore, they deferred any further evaluation or endoscopy during this hospitalization and was suggested that the patient follow up with Dr. Bosie Clos or [facility] in a few weeks.  At this point, his Coumadin was then placed on hold despite his INR  did increase consistent with recent antibiotic administration in the outpatient setting.  He was given some vitamin K and his INR is starting to trend downwards.  His SIADH has remained stable as has his atrial fibrillation.  I do note that he is not on any rate control agents and does not appear to require any.  His wife expressed concern about going home on the day of discharge. She noted he was quite unsteady.  I discussed on admission with her the projected needs at disposition and she had reported that they did have help in home and that he was pretty much at his baseline.  Nevertheless, physical therapy consultation was obtained and skilled nursing rehab was recommended.  The patient is in extremely high fall risk.  The patient however does not really wished to do this and his wife  has left for the evening and has no way to transport him.  She also notes that they will be difficult for them financially to provide 24-hour care as recommended by Physical Therapy.  I think if the patient returns home and remained in extremely high fall risk.  He is going to think over his options and we will discuss them in the morning with the rounding physician. Regardless, he will be stable for discharge tomorrow.  CONSULTATIONS:  With Gastroenterology.  Their recommendations as above.  PROCEDURES:  None.  IMAGING:  None.  PERTINENT LABORATORY STUDIES: 1. CBC notable for a hemoglobin of 10.1 on admission, on discharge 9.4     and stable.  Again, no blood products were administered. 2. INR noted to be 4.04 on admission and 2.45 prior to discharge, that     is September 25. 3. Sodium is stable at 129.  Review of his record demonstrates     longstanding hyponatremia dating back to at least November of 1999.  DISCHARGE INSTRUCTIONS:  The patient will be discharged either to a skilled nursing facility or with home health physical therapy with the recommendation for 24-hour care based on his and  his wife's decision. He does have some mild dementia, but his wife feels he can make this decision and she absolutely does not want to see a psychiatrist.  ACTIVITY:  Will be with assistance with a rolling walker.  DIET:  Low-sodium heart-healthy diet.  FOLLOWUP:  Follow up with Dr. Merri Brunette in 2 weeks and Dr. Bosie Clos and [facility] at [facility] in a few weeks.  He will need an appointment for either September 27 or 28 for a PT/INR check.  DISCHARGE MEDICATIONS: 1. Aricept 5 mg p.o. q.h.s. 2. Calcium and vitamin D p.o. b.i.d. 3. Fentanyl 25 mcg patch every 3 days to skin. 4. Fish oil 1 g 2 capsules p.o. daily. 5. Flexeril 10 mg p.o. q.h.s. 6. Hydrocodone/acetaminophen 5/500 one-half tablet p.o. every 6 hours     as needed for pain. 7. Hemorrhoid cream one application q.i.d. as needed. 8. Lorazepam 0.5 mg 1-2 tablets by mouth every 8 hours and two at     bedtime.  Consider weaning the patient off this medication given     his advanced age and fall risk.  We will defer to his primary care     physician. 9. Meclizine 25 mg p.o. t.i.d. as needed for dizziness. 10.Multivitamin p.o. daily. 11.Namenda 10 mg p.o. b.i.d.12.Polyethylene glycol 17 g p.o. daily as a needed for constipation. 13.Polytrim 1 drop both eyes six times daily. 14.Requip 2 mg p.o. q.h.s. 15.Spironolactone 25 mg half tablet p.o. daily. 16.Synthroid 88 mcg p.o. daily. 17.Sustained dry eye treatment 1 drop both eyes b.i.d. as needed. 18.Triamcinolone at one application b.i.d. as needed for skin     irritation. 19.Vitamin D 500 mg p.o. daily.  I would recommend discontinuing 1. Temazepam given the fact that he is already dosed with Ativan and     Flexeril at night.  Could also consider weaning off Flexeril as     well as possible. 2. Coumadin currently on hold, but would need to be restarted in     conjunction with his regular physicians.  He could probably resume     this shortly.  I did discuss  the above recommendations with his wife who will be following up with him in the morning.  Time coordinating discharge is 35 minutes.     Brendia Sacks, MD     DG/MEDQ  D:  03/09/2011  T:  03/09/2011  Job:  [id number]  Electronically Signed by Brendia Sacks  on 03/09/2011 10:45:52 PM

## 2011-03-10 LAB — CBC
Hemoglobin: 9.7 g/dL — ABNORMAL LOW (ref 13.0–17.0)
MCH: 30.5 pg (ref 26.0–34.0)
Platelets: 235 10*3/uL (ref 150–400)
RBC: 3.18 MIL/uL — ABNORMAL LOW (ref 4.22–5.81)

## 2011-03-10 LAB — PROTIME-INR
INR: 1.45 (ref 0.00–1.49)
Prothrombin Time: 17.9 seconds — ABNORMAL HIGH (ref 11.6–15.2)

## 2011-03-11 LAB — PROTIME-INR
INR: 1.22 (ref 0.00–1.49)
Prothrombin Time: 15.7 seconds — ABNORMAL HIGH (ref 11.6–15.2)

## 2011-03-11 LAB — CBC
MCH: 30.4 pg (ref 26.0–34.0)
MCHC: 33.3 g/dL (ref 30.0–36.0)
Platelets: 238 10*3/uL (ref 150–400)
RBC: 3.12 MIL/uL — ABNORMAL LOW (ref 4.22–5.81)

## 2011-03-17 LAB — POCT I-STAT, CHEM 8
Calcium, Ion: 1.12
Creatinine, Ser: 0.9
Glucose, Bld: 104 — ABNORMAL HIGH
HCT: 37 — ABNORMAL LOW
Hemoglobin: 12.6 — ABNORMAL LOW
TCO2: 27

## 2011-03-17 LAB — DIFFERENTIAL
Basophils Absolute: 0
Basophils Relative: 0
Lymphocytes Relative: 21
Neutro Abs: 3.1
Neutrophils Relative %: 60

## 2011-03-17 LAB — CBC
MCHC: 34.6
RDW: 13.3

## 2011-03-17 LAB — PROTIME-INR
INR: 2.4 — ABNORMAL HIGH
Prothrombin Time: 27.3 — ABNORMAL HIGH

## 2011-03-17 LAB — APTT: aPTT: 51 — ABNORMAL HIGH

## 2011-03-19 LAB — CBC
HCT: 39.8 % (ref 39.0–52.0)
Platelets: 219 10*3/uL (ref 150–400)
RDW: 13.6 % (ref 11.5–15.5)

## 2011-03-19 LAB — BASIC METABOLIC PANEL
BUN: 16 mg/dL (ref 6–23)
Calcium: 9.3 mg/dL (ref 8.4–10.5)
Creatinine, Ser: 0.6 mg/dL (ref 0.4–1.5)
GFR calc non Af Amer: >60 mL/min (ref >60)
Glucose, Bld: 85 mg/dL (ref 70–99)

## 2011-03-19 LAB — PROTIME-INR
INR: 1.2 (ref 0.00–1.49)
Prothrombin Time: 15.5 seconds — ABNORMAL HIGH (ref 11.6–15.2)

## 2011-03-19 LAB — DIFFERENTIAL
Basophils Absolute: 0 10*3/uL (ref 0.0–0.1)
Eosinophils Relative: 2 % (ref 0–5)
Lymphocytes Relative: 28 % (ref 12–46)
Neutro Abs: 3.1 10*3/uL (ref 1.7–7.7)
Neutrophils Relative %: 57 % (ref 43–77)

## 2011-03-24 ENCOUNTER — Other Ambulatory Visit (INDEPENDENT_AMBULATORY_CARE_PROVIDER_SITE_OTHER): Payer: Medicare Other | Admitting: *Deleted

## 2011-03-24 ENCOUNTER — Ambulatory Visit (INDEPENDENT_AMBULATORY_CARE_PROVIDER_SITE_OTHER): Payer: Medicare Other | Admitting: *Deleted

## 2011-03-24 DIAGNOSIS — L97909 Non-pressure chronic ulcer of unspecified part of unspecified lower leg with unspecified severity: Secondary | ICD-10-CM

## 2011-03-25 ENCOUNTER — Encounter: Payer: Self-pay | Admitting: Plastic Surgery

## 2011-03-29 NOTE — Procedures (Unsigned)
DUPLEX DEEP VENOUS EXAM - LOWER EXTREMITY  INDICATION:  Left lower extremity ulcer.  HISTORY:  Edema:  No. Trauma/Surgery:  No. Pain:  No. PE:  No. Previous DVT:  No. Anticoagulants:  No. Other:  DUPLEX EXAM:               CFV   SFV   PopV  PTV    GSV               R  L  R  L  R  L  R   L  R  L Thrombosis    o  o  o  o  o  o  o   o  o  o Spontaneous   +  +  +  +  +  +  +   +  +  + Phasic        +  +  +  +  +  +  +   +  +  + Augmentation  +  +  +  +  +  +  +   +  +  + Compressible  +  +  +  +  +  +  +   +  +  + Competent     o  o  o  o  o  o  +   +  +  +  Legend:  + - yes  o - no  p - partial  D - decreased  IMPRESSION: 1. No evidence of bilateral lower extremity deep venous thrombosis. 2. There is evidence of deep vein insufficiency noted bilaterally.   _____________________________ Quita Skye Hart Rochester, M.D.  EM/MEDQ  D:  03/24/2011  T:  03/24/2011  Job:  [id number]

## 2011-03-31 ENCOUNTER — Encounter (HOSPITAL_BASED_OUTPATIENT_CLINIC_OR_DEPARTMENT_OTHER): Payer: Medicare Other

## 2011-04-06 NOTE — Consult Note (Signed)
[name], EDELL NO.:  [id number]  MEDICAL RECORD NO.:  [id number]  LOCATION:  1419                         FACILITY:  [facility]  PHYSICIAN:  Petra Kuba, M.D.    DATE OF BIRTH:  April 22, 1923  DATE OF CONSULTATION: DATE OF DISCHARGE:                                CONSULTATION   HISTORY:  This patient is known to my partner, Dr. Bosie Clos.  He presented with an increased PT, some black bowel movements, a hemoglobin of 10 down from a possible baseline of 11.7 in August and was admitted for further workup and plans.  Other than his recent GI bleeding he has had a little alternating diarrhea and constipation.  He has known diverticular disease, but no upper tract symptoms and has not been on any extra aspirin or nonsteroidals.  He was recently on some antibiotics, which may have increased his INR.  He has general weakness, has trouble getting around at home, requires a walker, and needs multiple assistance for multiple problems.  PAST MEDICAL HISTORY: 1. Pertinent for coronary artery disease, aFib, on Coumadin. 2. Mild dementia. 3. Hypothyroidism. 4. Diverticulosis. 5. L2 compression fracture. 6. Glaucoma. 7. History of sacral ulcers. 8. Macular degeneration. 9. Ataxia. 10.Urinary incontinence. 11.Possible sleep apnea as well as restless leg.  PAST SURGICAL HISTORY:  He has had a pacemaker, hernia repair, cardioversion, L2 kyphosis, cataract extractions and knee surgery.  ALLERGIES:  PENICILLIN.  SOCIAL HISTORY:  Does not smoke or drink and denies any aspirin or nonsteroidals.  FAMILY HISTORY:  Pertinent for, in the chart on the H&P, it says his mother died of a bleeding ulcer, but it is actually his mother-in-law on my discussion with him.  No other GI issues.  MEDICATIONS:  Medicines at home include hemorrhoidal cream, eyedrops, Coumadin, ReQuip, multivitamins, fish oil, triamcinolone, calcium and vitamin D, vitamin C, Vicodin, meclizine,  lorazepam, Flexeril, Aricept, Namenda, MiraLax, Aldactone, Synthroid, temazepam, and fentanyl patch.  REVIEW OF SYSTEMS:  Negative except for above.  He has not had a bowel movement today or last night.  PHYSICAL EXAMINATION:  VITAL SIGNS:  In chart.  In no acute distress. LUNGS:  Clear. HEART:  Seems regular in rate without any obvious murmurs. ABDOMEN:  Soft, nontender.  LABORATORY DATA:  Labs are pertinent for normal BUN and creatinine x2, PT of 39.9 with an INR of 4.04.  He got 2 units of FFP.  The PT has not been repeated.  Hemoglobin did drop to 8.2 and is now 8.5.  His last colonoscopy in reviewing our chart was in 2010, he has had three over the years by Dr. Virginia Rochester, at least.  ASSESSMENT: 1. Multiple medical problems. 2. History of diverticular disease. 3. Gastrointestinal bleed and the patient with an increased INR.  PLAN:  Repeat PT.  See if he needs any further FFP.  His INR is now in the therapeutic range and no signs of bleeding, do not need to reverse further.  We will observe for continued bleeding, watching his stools, H&H and keeping on clear liquids.  I doubt this is an upper GI bleed secondary to no aspirin, nonsteroidals, and his BUN not being up, would consider  endoscopy just to be sure.  We will follow with you.          ______________________________ Petra Kuba, M.D.     MEM/MEDQ  D:  03/07/2011  T:  03/07/2011  Job:  [id number]  cc:   Shirley Friar, MD Fax: [phone]  Genene Churn. Love, M.D. Fax: [phone]  Dario Guardian, M.D. Fax: [phone]  Electronically Signed by Vida Rigger M.D. on 04/06/2011 02:29:49 PM

## 2011-05-05 ENCOUNTER — Encounter (HOSPITAL_BASED_OUTPATIENT_CLINIC_OR_DEPARTMENT_OTHER): Payer: Medicare Other

## 2011-06-15 DIAGNOSIS — R7881 Bacteremia: Secondary | ICD-10-CM

## 2011-06-15 HISTORY — DX: Bacteremia: R78.81

## 2011-06-24 DIAGNOSIS — D235 Other benign neoplasm of skin of trunk: Secondary | ICD-10-CM | POA: Diagnosis not present

## 2011-06-24 DIAGNOSIS — D0470 Carcinoma in situ of skin of unspecified lower limb, including hip: Secondary | ICD-10-CM | POA: Diagnosis not present

## 2011-06-24 DIAGNOSIS — Z85828 Personal history of other malignant neoplasm of skin: Secondary | ICD-10-CM | POA: Diagnosis not present

## 2011-07-01 DIAGNOSIS — R05 Cough: Secondary | ICD-10-CM | POA: Diagnosis not present

## 2011-07-01 DIAGNOSIS — I10 Essential (primary) hypertension: Secondary | ICD-10-CM | POA: Diagnosis not present

## 2011-07-01 DIAGNOSIS — Z7901 Long term (current) use of anticoagulants: Secondary | ICD-10-CM | POA: Diagnosis not present

## 2011-07-01 DIAGNOSIS — K5900 Constipation, unspecified: Secondary | ICD-10-CM | POA: Diagnosis not present

## 2011-07-01 DIAGNOSIS — G8929 Other chronic pain: Secondary | ICD-10-CM | POA: Diagnosis not present

## 2011-07-01 DIAGNOSIS — R634 Abnormal weight loss: Secondary | ICD-10-CM | POA: Diagnosis not present

## 2011-07-14 DIAGNOSIS — M1710 Unilateral primary osteoarthritis, unspecified knee: Secondary | ICD-10-CM | POA: Diagnosis not present

## 2011-07-20 DIAGNOSIS — I4891 Unspecified atrial fibrillation: Secondary | ICD-10-CM | POA: Diagnosis not present

## 2011-07-20 DIAGNOSIS — H543 Unqualified visual loss, both eyes: Secondary | ICD-10-CM | POA: Diagnosis not present

## 2011-07-20 DIAGNOSIS — IMO0001 Reserved for inherently not codable concepts without codable children: Secondary | ICD-10-CM | POA: Diagnosis not present

## 2011-07-20 DIAGNOSIS — M1710 Unilateral primary osteoarthritis, unspecified knee: Secondary | ICD-10-CM | POA: Diagnosis not present

## 2011-07-20 DIAGNOSIS — I5030 Unspecified diastolic (congestive) heart failure: Secondary | ICD-10-CM | POA: Diagnosis not present

## 2011-07-23 DIAGNOSIS — H543 Unqualified visual loss, both eyes: Secondary | ICD-10-CM | POA: Diagnosis not present

## 2011-07-23 DIAGNOSIS — I5030 Unspecified diastolic (congestive) heart failure: Secondary | ICD-10-CM | POA: Diagnosis not present

## 2011-07-23 DIAGNOSIS — IMO0001 Reserved for inherently not codable concepts without codable children: Secondary | ICD-10-CM | POA: Diagnosis not present

## 2011-07-23 DIAGNOSIS — I4891 Unspecified atrial fibrillation: Secondary | ICD-10-CM | POA: Diagnosis not present

## 2011-07-23 DIAGNOSIS — M1710 Unilateral primary osteoarthritis, unspecified knee: Secondary | ICD-10-CM | POA: Diagnosis not present

## 2011-07-28 DIAGNOSIS — B9789 Other viral agents as the cause of diseases classified elsewhere: Secondary | ICD-10-CM | POA: Diagnosis not present

## 2011-07-29 ENCOUNTER — Other Ambulatory Visit: Payer: Self-pay

## 2011-07-29 ENCOUNTER — Encounter (HOSPITAL_COMMUNITY): Payer: Self-pay | Admitting: Neurology

## 2011-07-29 ENCOUNTER — Inpatient Hospital Stay (HOSPITAL_COMMUNITY)
Admission: EM | Admit: 2011-07-29 | Discharge: 2011-08-01 | DRG: 194 | Disposition: A | Discharge status: Home Health | Payer: Medicare Other | Attending: Internal Medicine | Admitting: Internal Medicine

## 2011-07-29 ENCOUNTER — Emergency Department (HOSPITAL_COMMUNITY): Payer: Medicare Other

## 2011-07-29 DIAGNOSIS — G2581 Restless legs syndrome: Secondary | ICD-10-CM | POA: Diagnosis present

## 2011-07-29 DIAGNOSIS — I4891 Unspecified atrial fibrillation: Secondary | ICD-10-CM | POA: Diagnosis not present

## 2011-07-29 DIAGNOSIS — M129 Arthropathy, unspecified: Secondary | ICD-10-CM | POA: Diagnosis present

## 2011-07-29 DIAGNOSIS — I6340 Cerebral infarction due to embolism of unspecified cerebral artery: Secondary | ICD-10-CM | POA: Diagnosis not present

## 2011-07-29 DIAGNOSIS — J4489 Other specified chronic obstructive pulmonary disease: Secondary | ICD-10-CM | POA: Diagnosis present

## 2011-07-29 DIAGNOSIS — R29898 Other symptoms and signs involving the musculoskeletal system: Secondary | ICD-10-CM | POA: Diagnosis not present

## 2011-07-29 DIAGNOSIS — J479 Bronchiectasis, uncomplicated: Secondary | ICD-10-CM | POA: Diagnosis not present

## 2011-07-29 DIAGNOSIS — Z87891 Personal history of nicotine dependence: Secondary | ICD-10-CM | POA: Diagnosis not present

## 2011-07-29 DIAGNOSIS — I509 Heart failure, unspecified: Secondary | ICD-10-CM | POA: Diagnosis present

## 2011-07-29 DIAGNOSIS — R32 Unspecified urinary incontinence: Secondary | ICD-10-CM | POA: Diagnosis present

## 2011-07-29 DIAGNOSIS — R627 Adult failure to thrive: Secondary | ICD-10-CM | POA: Diagnosis present

## 2011-07-29 DIAGNOSIS — R5381 Other malaise: Secondary | ICD-10-CM | POA: Diagnosis not present

## 2011-07-29 DIAGNOSIS — E785 Hyperlipidemia, unspecified: Secondary | ICD-10-CM | POA: Diagnosis present

## 2011-07-29 DIAGNOSIS — J189 Pneumonia, unspecified organism: Principal | ICD-10-CM

## 2011-07-29 DIAGNOSIS — E0590 Thyrotoxicosis, unspecified without thyrotoxic crisis or storm: Secondary | ICD-10-CM

## 2011-07-29 DIAGNOSIS — Z79899 Other long term (current) drug therapy: Secondary | ICD-10-CM

## 2011-07-29 DIAGNOSIS — J438 Other emphysema: Secondary | ICD-10-CM | POA: Diagnosis not present

## 2011-07-29 DIAGNOSIS — R4789 Other speech disturbances: Secondary | ICD-10-CM | POA: Diagnosis not present

## 2011-07-29 DIAGNOSIS — D6859 Other primary thrombophilia: Secondary | ICD-10-CM

## 2011-07-29 DIAGNOSIS — J90 Pleural effusion, not elsewhere classified: Secondary | ICD-10-CM | POA: Diagnosis not present

## 2011-07-29 DIAGNOSIS — R791 Abnormal coagulation profile: Secondary | ICD-10-CM | POA: Diagnosis present

## 2011-07-29 DIAGNOSIS — E039 Hypothyroidism, unspecified: Secondary | ICD-10-CM

## 2011-07-29 DIAGNOSIS — Z950 Presence of cardiac pacemaker: Secondary | ICD-10-CM

## 2011-07-29 DIAGNOSIS — G589 Mononeuropathy, unspecified: Secondary | ICD-10-CM | POA: Diagnosis present

## 2011-07-29 DIAGNOSIS — F0390 Unspecified dementia without behavioral disturbance: Secondary | ICD-10-CM | POA: Diagnosis present

## 2011-07-29 DIAGNOSIS — F068 Other specified mental disorders due to known physiological condition: Secondary | ICD-10-CM

## 2011-07-29 DIAGNOSIS — I6789 Other cerebrovascular disease: Secondary | ICD-10-CM | POA: Diagnosis not present

## 2011-07-29 DIAGNOSIS — G8929 Other chronic pain: Secondary | ICD-10-CM

## 2011-07-29 DIAGNOSIS — M4800 Spinal stenosis, site unspecified: Secondary | ICD-10-CM

## 2011-07-29 DIAGNOSIS — R634 Abnormal weight loss: Secondary | ICD-10-CM | POA: Diagnosis present

## 2011-07-29 DIAGNOSIS — R5383 Other fatigue: Secondary | ICD-10-CM | POA: Diagnosis not present

## 2011-07-29 DIAGNOSIS — Z7901 Long term (current) use of anticoagulants: Secondary | ICD-10-CM | POA: Diagnosis not present

## 2011-07-29 DIAGNOSIS — J449 Chronic obstructive pulmonary disease, unspecified: Secondary | ICD-10-CM | POA: Diagnosis present

## 2011-07-29 DIAGNOSIS — I2510 Atherosclerotic heart disease of native coronary artery without angina pectoris: Secondary | ICD-10-CM

## 2011-07-29 DIAGNOSIS — Z0389 Encounter for observation for other suspected diseases and conditions ruled out: Secondary | ICD-10-CM | POA: Insufficient documentation

## 2011-07-29 DIAGNOSIS — R531 Weakness: Secondary | ICD-10-CM

## 2011-07-29 DIAGNOSIS — D649 Anemia, unspecified: Secondary | ICD-10-CM | POA: Diagnosis present

## 2011-07-29 DIAGNOSIS — J159 Unspecified bacterial pneumonia: Secondary | ICD-10-CM | POA: Diagnosis not present

## 2011-07-29 HISTORY — DX: Presence of cardiac pacemaker: Z95.0

## 2011-07-29 HISTORY — DX: Restless legs syndrome: G25.81

## 2011-07-29 HISTORY — DX: Unspecified osteoarthritis, unspecified site: M19.90

## 2011-07-29 HISTORY — DX: Unspecified urinary incontinence: R32

## 2011-07-29 HISTORY — DX: Encounter for other specified aftercare: Z51.89

## 2011-07-29 HISTORY — DX: Unspecified dementia, unspecified severity, without behavioral disturbance, psychotic disturbance, mood disturbance, and anxiety: F03.90

## 2011-07-29 HISTORY — DX: Hypothyroidism, unspecified: E03.9

## 2011-07-29 HISTORY — DX: Gastrointestinal hemorrhage, unspecified: K92.2

## 2011-07-29 HISTORY — DX: Anemia, unspecified: D64.9

## 2011-07-29 LAB — CBC
MCH: 31.7 pg (ref 26.0–34.0)
MCV: 93.5 fL (ref 78.0–100.0)
Platelets: 180 10*3/uL (ref 150–400)
RBC: 3.38 MIL/uL — ABNORMAL LOW (ref 4.22–5.81)

## 2011-07-29 LAB — CK TOTAL AND CKMB (NOT AT ARMC)
CK, MB: 3 ng/mL (ref 0.3–4.0)
Relative Index: INVALID (ref 0.0–2.5)
Total CK: 70 U/L (ref 7–232)

## 2011-07-29 LAB — URINALYSIS, ROUTINE W REFLEX MICROSCOPIC
Glucose, UA: NEGATIVE mg/dL
Hgb urine dipstick: NEGATIVE
Ketones, ur: NEGATIVE mg/dL
Protein, ur: NEGATIVE mg/dL
pH: 6 (ref 5.0–8.0)

## 2011-07-29 LAB — COMPREHENSIVE METABOLIC PANEL
BUN: 19 mg/dL (ref 6–23)
Calcium: 8.9 mg/dL (ref 8.4–10.5)
GFR calc Af Amer: >90 mL/min (ref >90)
Glucose, Bld: 87 mg/dL (ref 70–99)
Total Protein: 6.9 g/dL (ref 6.0–8.3)

## 2011-07-29 LAB — POCT I-STAT, CHEM 8
BUN: 20 mg/dL (ref 6–23)
Calcium, Ion: 1.11 mmol/L — ABNORMAL LOW (ref 1.12–1.32)
Chloride: 99 mEq/L (ref 96–112)
Creatinine, Ser: 0.7 mg/dL (ref 0.50–1.35)
TCO2: 25 mmol/L (ref 0–100)

## 2011-07-29 LAB — PROTIME-INR: Prothrombin Time: 63.4 seconds — ABNORMAL HIGH (ref 11.6–15.2)

## 2011-07-29 LAB — DIFFERENTIAL
Eosinophils Absolute: 0.1 10*3/uL (ref 0.0–0.7)
Eosinophils Relative: 2 % (ref 0–5)
Lymphs Abs: 1.1 10*3/uL (ref 0.7–4.0)
Monocytes Relative: 13 % — ABNORMAL HIGH (ref 3–12)

## 2011-07-29 LAB — TROPONIN I: Troponin I: <0.3 ng/mL (ref <0.30)

## 2011-07-29 MED ORDER — ACETAMINOPHEN 650 MG RE SUPP: 650.0000 mg | Freq: Four times a day (QID) | RECTAL | Status: DC | PRN

## 2011-07-29 MED ORDER — MOXIFLOXACIN HCL IN NACL 400 MG/250ML IV SOLN
400.0000 mg | INTRAVENOUS | Status: DC
  Administered 2011-07-29 – 2011-07-31 (×3): 400 mg via INTRAVENOUS
  Filled 2011-07-29 (×4): qty 250

## 2011-07-29 MED ORDER — ACETAMINOPHEN 325 MG PO TABS
650.0000 mg | ORAL_TABLET | Freq: Four times a day (QID) | ORAL | Status: DC | PRN
  Administered 2011-07-29 (×2): 650 mg via ORAL
  Filled 2011-07-29 (×2): qty 2

## 2011-07-29 MED ORDER — VITAMIN K1 10 MG/ML IJ SOLN
5.0000 mg | Freq: Once | INTRAVENOUS | Status: AC
  Administered 2011-07-29: 5 mg via INTRAVENOUS
  Filled 2011-07-29: qty 0.5

## 2011-07-29 MED ORDER — SODIUM CHLORIDE 0.9 % IV BOLUS (SEPSIS)
500.0000 mL | Freq: Once | INTRAVENOUS | Status: AC
  Administered 2011-07-29: 500 mL via INTRAVENOUS

## 2011-07-29 MED ORDER — CYCLOBENZAPRINE HCL 10 MG PO TABS
10.0000 mg | ORAL_TABLET | Freq: Every day | ORAL | Status: DC
  Administered 2011-07-29 – 2011-07-31 (×3): 10 mg via ORAL
  Filled 2011-07-29 (×4): qty 1

## 2011-07-29 MED ORDER — ALBUTEROL SULFATE (5 MG/ML) 0.5% IN NEBU: 2.5000 mg | INHALATION_SOLUTION | RESPIRATORY_TRACT | Status: DC | PRN

## 2011-07-29 MED ORDER — ONDANSETRON HCL 4 MG PO TABS: 4.0000 mg | ORAL_TABLET | Freq: Four times a day (QID) | ORAL | Status: DC | PRN

## 2011-07-29 MED ORDER — DONEPEZIL HCL 5 MG PO TABS
5.0000 mg | ORAL_TABLET | Freq: Every day | ORAL | Status: DC
  Administered 2011-07-29 – 2011-07-31 (×3): 5 mg via ORAL
  Filled 2011-07-29 (×4): qty 1

## 2011-07-29 MED ORDER — LORAZEPAM 0.5 MG PO TABS
0.5000 mg | ORAL_TABLET | Freq: Three times a day (TID) | ORAL | Status: DC | PRN
  Administered 2011-07-30: 0.5 mg via ORAL
  Filled 2011-07-29: qty 1

## 2011-07-29 MED ORDER — LEVOTHYROXINE SODIUM 88 MCG PO TABS
88.0000 ug | ORAL_TABLET | Freq: Every day | ORAL | Status: DC
  Administered 2011-07-30 – 2011-08-01 (×3): 88 ug via ORAL
  Filled 2011-07-29 (×4): qty 1

## 2011-07-29 MED ORDER — SODIUM CHLORIDE 0.9 % IV SOLN
INTRAVENOUS | Status: DC
  Administered 2011-07-29: 12:00:00 via INTRAVENOUS

## 2011-07-29 MED ORDER — POLYVINYL ALCOHOL 1.4 % OP SOLN
1.0000 [drp] | Freq: Three times a day (TID) | OPHTHALMIC | Status: DC | PRN
Start: 2011-07-29 — End: 2011-08-01
  Filled 2011-07-29: qty 15

## 2011-07-29 MED ORDER — MEMANTINE HCL 10 MG PO TABS
10.0000 mg | ORAL_TABLET | Freq: Two times a day (BID) | ORAL | Status: DC
  Administered 2011-07-29 – 2011-08-01 (×6): 10 mg via ORAL
  Filled 2011-07-29 (×9): qty 1

## 2011-07-29 MED ORDER — HYOSCYAMINE SULFATE 0.125 MG PO TABS
0.1250 mg | ORAL_TABLET | Freq: Every day | ORAL | Status: DC | PRN
  Filled 2011-07-29: qty 1

## 2011-07-29 MED ORDER — SENNOSIDES-DOCUSATE SODIUM 8.6-50 MG PO TABS
1.0000 | ORAL_TABLET | Freq: Every evening | ORAL | Status: DC | PRN
  Filled 2011-07-29: qty 1

## 2011-07-29 MED ORDER — ONDANSETRON HCL 4 MG/2ML IJ SOLN: 4.0000 mg | Freq: Four times a day (QID) | INTRAMUSCULAR | Status: DC | PRN

## 2011-07-29 MED ORDER — ALUM & MAG HYDROXIDE-SIMETH 200-200-20 MG/5ML PO SUSP: 30.0000 mL | Freq: Four times a day (QID) | ORAL | Status: DC | PRN

## 2011-07-29 MED ORDER — TOBRAMYCIN-DEXAMETHASONE 0.3-0.1 % OP OINT
1.0000 "application " | TOPICAL_OINTMENT | Freq: Three times a day (TID) | OPHTHALMIC | Status: DC
  Administered 2011-07-29 – 2011-07-30 (×2): 1 via OPHTHALMIC
  Filled 2011-07-29 (×3): qty 3.5

## 2011-07-29 MED ORDER — HYDROCORTISONE ACE-PRAMOXINE 2.5-1 & 1 % RE KIT: 1.0000 "application " | PACK | Freq: Four times a day (QID) | RECTAL | Status: DC | PRN

## 2011-07-29 MED ORDER — FENTANYL 25 MCG/HR TD PT72
25.0000 ug | MEDICATED_PATCH | TRANSDERMAL | Status: DC
  Administered 2011-07-30: 25 ug via TRANSDERMAL
  Filled 2011-07-29: qty 1

## 2011-07-29 MED ORDER — MOXIFLOXACIN HCL IN NACL 400 MG/250ML IV SOLN
400.0000 mg | Freq: Once | INTRAVENOUS | Status: AC
  Administered 2011-07-29: 400 mg via INTRAVENOUS
  Filled 2011-07-29: qty 250

## 2011-07-29 MED ORDER — HYDROCODONE-ACETAMINOPHEN 5-325 MG PO TABS
1.0000 | ORAL_TABLET | Freq: Four times a day (QID) | ORAL | Status: DC | PRN
  Administered 2011-07-30 – 2011-08-01 (×6): 1 via ORAL
  Filled 2011-07-29 (×6): qty 1

## 2011-07-29 MED ORDER — POLYETHYL GLYCOL-PROPYL GLYCOL 0.4-0.3 % OP SOLN: 1.0000 [drp] | Freq: Three times a day (TID) | OPHTHALMIC | Status: DC | PRN

## 2011-07-29 NOTE — ED Notes (Signed)
Dr Dierdre Searles in to speak to family

## 2011-07-29 NOTE — Progress Notes (Signed)
ANTICOAGULATION CONSULT NOTE - Initial Consult  Pharmacy Consult for Coumadin Indication: atrial fibrillation  Allergies  Allergen Reactions  . Contrast Media (Iodinated Diagnostic Agents) Other (See Comments)    Unknown.  Marland Kitchen Penicillins Hives  . Uroxatral (Alfuzosin Hydrochloride) Other (See Comments)    hypotension    Vital Signs: Temp: 97.9 F (36.6 C) (02/14 1342) Temp src: Oral (02/14 1342) BP: 111/58 mmHg (02/14 1342) Pulse Rate: 77  (02/14 1342)  Labs:  Basename 07/29/11 0941 07/29/11 0920 07/29/11 0919  HGB 11.6* -- 10.7*  HCT 34.0* -- 31.6*  PLT -- -- 180  APTT -- -- 83*  LABPROT -- -- 63.4*  INR -- -- 7.30*  HEPARINUNFRC -- -- --  CREATININE 0.70 -- 0.50  CKTOTAL -- 70 --  CKMB -- 3.0 --  TROPONINI -- <0.30 --    Medical History: Past Medical History  Diagnosis Date  . CHF (congestive heart failure)   . Arthritis   . Pacemaker   . Incontinence   . Pneumonia 2011; 07/29/11  . Neuropathy   . Heart murmur   . Dysrhythmia     paced  . Hypothyroidism   . Restless leg syndrome   . Blood transfusion   . Anemia   . Lower GI bleeding 2011    "from ATB I wasn't suppose to have had ordered"  . Dementia 07/29/11     not observed by this RN    Medications:  See med rec  Assessment: 78 yo M admitted with AMS.  To continue his chronic Coumadin therapy for AFib.  INR was supratherapeutic this morning and Vit K 5 mg IV was given at 1205.  Has a hx of lower GIB, Hgb is low, but pt did not report any recent bleeding.  Last dose of Coumadin was last night (6mg ), and last INR check was about a month ago per the patient.  He states he takes 5 mg daily except 6 mg on MWF.    Goal of Therapy:  INR 2-3   Plan:  1.  No Coumadin today 2.  Daily PT/INR, F/U in AM 3.  Will need lower home dose on discharge (started zithromax yesterday and had Avelox today, which can increase INR)  Rolland Porter, Pharm.D., BCPS Clinical Pharmacist Pager: [phone]

## 2011-07-29 NOTE — ED Notes (Signed)
Code stroke called [phone]. Pt arrived 0903. EDP exam [phone]. Stroke team arrival [phone]. LSN 2100. Pt arrived in CT 0910. Phlebotomist arrival [phone]. Code stroke cancelled at 0905, pt not a TPA candidate.

## 2011-07-29 NOTE — ED Notes (Signed)
Dr Ranae Palms aware of PT INR

## 2011-07-29 NOTE — H&P (Signed)
PCP:   Allean Found, MD, MD   Chief Complaint:  Weakness  HPI: Patient is an 89 showed gentleman has a past medical history significant for mild dementia, atrial fibrillation, urinary incontinence, lower GI bleeding and CHF who presented to the hospital this morning after falling out of the bed feeling like he had weakness on his left side. His family reports that for the past 2-3 days he has been getting progressively weaker at home with mild mental status changes, confusion and poor appetite. They're not aware of any complaints of fevers cough congestion or sick contacts. The patient denies any hematemesis. He does not have anynausea vomiting or diarrhea. He does endorse a very mild cough but says he does not have any shortness of breath the family has not noticed him struggling to breathe and they have not noticed any new cough. Until about 2 days ago he has been in his normal health which is with multiple chronic diseases and mildly debilitated with reduced functional status. His major complaint is back pain for which he says he has not received his normal pain medications in ED  that he takes at home.  Allergies:   Allergies  Allergen Reactions  . Contrast Media (Iodinated Diagnostic Agents) Other (See Comments)    Unknown.  Marland Kitchen Penicillins Hives  . Uroxatral (Alfuzosin Hydrochloride) Other (See Comments)    hypotension      Past Medical History  Diagnosis Date  . CHF (congestive heart failure)   . Arthritis   . Pacemaker   . Incontinence   . Pneumonia 2011; 07/29/11  . Neuropathy   . Heart murmur   . Dysrhythmia     paced  . Hypothyroidism   . Restless leg syndrome   . Blood transfusion   . Anemia   . Lower GI bleeding 2011    "from ATB I wasn't suppose to have had ordered"  . Dementia 07/29/11     not observed by this RN    Past Surgical History  Procedure Date  . Insert / replace / remove pacemaker ~ 2006    initial placement  . Insert / replace / remove  pacemaker 05/2009  . Knee arthroscopy ~ 2000's    right  . Back surgery     "as a kid"  . Inguinal hernia repair 1960's    left  . Inguinal hernia repair 1973    right    Prior to Admission medications   Medication Sig Start Date End Date Taking? Authorizing Provider  azithromycin (ZITHROMAX) 250 MG tablet Take 250-500 mg by mouth See admin instructions. Take 2 tablets by mouth the first day, then 1 tablet daily for a total of 5 days.  Started on 07/28/11   Yes Historical Provider, MD  Calcium Carbonate-Vitamin D (CALCIUM 600/VITAMIN D PO) Take 1 tablet by mouth 2 (two) times daily.    Yes Historical Provider, MD  cyclobenzaprine (FLEXERIL) 10 MG tablet Take 10 mg by mouth at bedtime.   Yes Historical Provider, MD  donepezil (ARICEPT) 5 MG tablet Take 5 mg by mouth at bedtime.    Yes Historical Provider, MD  fentaNYL (DURAGESIC - DOSED MCG/HR) 25 MCG/HR Place 1 patch onto the skin every 3 (three) days.   Yes Historical Provider, MD  HYDROcodone-acetaminophen (LORTAB) 10-500 MG per tablet Take 1 tablet by mouth every 6 (six) hours as needed. For pain.   Yes Historical Provider, MD  Hydrocortisone Ace-Pramoxine 2.5-1 & 1 % KIT Place 1 application rectally 4 (four)  times daily as needed. For hemorrhoids.   Yes Historical Provider, MD  hyoscyamine (LEVSIN, ANASPAZ) 0.125 MG tablet Take 0.125 mg by mouth daily as needed. For stomach cramping   Yes Historical Provider, MD  levothyroxine (SYNTHROID, LEVOTHROID) 88 MCG tablet Take 88 mcg by mouth every morning.   Yes Historical Provider, MD  LORazepam (ATIVAN) 0.5 MG tablet Take 0.5-1 mg by mouth every 8 (eight) hours as needed. For anxiety   Yes Historical Provider, MD  meclizine (ANTIVERT) 25 MG tablet Take 25 mg by mouth 3 (three) times daily as needed. For dizziness   Yes Historical Provider, MD  memantine (NAMENDA) 10 MG tablet Take 10 mg by mouth 2 (two) times daily.   Yes Historical Provider, MD  Mirabegron (MYRBETRIQ PO) Take 25 mg by mouth  at bedtime.    Yes Historical Provider, MD  omega-3 acid ethyl esters (LOVAZA) 1 G capsule Take 1 g by mouth at bedtime.    Yes Historical Provider, MD  Polyethyl Glycol-Propyl Glycol (SYSTANE) 0.4-0.3 % SOLN Place 1 drop into both eyes 3 (three) times daily as needed. For dry eyes   Yes Historical Provider, MD  silver sulfADIAZINE (SILVADENE) 1 % cream Apply 1 application topically daily. Until wound is clear   Yes Historical Provider, MD  spironolactone (ALDACTONE) 25 MG tablet Take 12.5 mg by mouth daily.   Yes Historical Provider, MD  temazepam (RESTORIL) 15 MG capsule Take 15 mg by mouth at bedtime.   Yes Historical Provider, MD  tobramycin-dexamethasone Wallene Dales) ophthalmic ointment Place 1 application into both eyes every 6 (six) hours as needed. For eye infection   Yes Historical Provider, MD  triamcinolone cream (KENALOG) 0.1 % Apply 1 application topically 2 (two) times daily as needed. For rash   Yes Historical Provider, MD  vitamin C (ASCORBIC ACID) 500 MG tablet Take 500 mg by mouth at bedtime.    Yes Historical Provider, MD  warfarin (COUMADIN) 1 MG tablet Take 1 mg by mouth See admin instructions. Take 1 tablet by mouth along with a 5MG  tablet on Tuesday, Thursday, Saturday, and Sunday.   Yes Historical Provider, MD  warfarin (COUMADIN) 5 MG tablet Take 5 mg by mouth daily. On Tuesday, Thursday, Saturday, and Sunday, take one tablet along with a 1MG  tablet.   Yes Historical Provider, MD    Social History:  reports that he quit smoking about 40 years ago. His smoking use included Cigarettes. He has a 30 pack-year smoking history. He has never used smokeless tobacco. He reports that he drinks alcohol. He reports that he does not use illicit drugs.  History reviewed. No pertinent family history.  Review of Systems:  Constitutional: Denies fever, chills, diaphoresis, appetite change and fatigue.  HEENT: Denies congestion, sore throat, rhinorrhea, sneezing, mouth sores, trouble  swallowing, neck pain, neck stiffness and tinnitus.   Respiratory: Denies SOB, mild cough Cardiovascular: Denies chest pain, palpitations and leg swelling.  Gastrointestinal: Denies nausea, vomiting, abdominal pain, diarrhea, constipation, blood in stool and abdominal distention.  Genitourinary: Denies dysuria, urgency, frequency, hematuria, flank pain and difficulty urinating.  Musculoskeletal: Denies myalgias, back pain, joint swelling, arthralgias and gait problem.  Skin: Denies pallor, rash and wound.  Neurological: Denies dizziness, seizures, syncope, weakness, light-headedness, numbness and headaches.  Hematological: Denies adenopathy. Easy bruising, personal or family bleeding history  Psychiatric/Behavioral: Denies suicidal ideation, mood changes, confusion, nervousness, sleep disturbance and agitation   Physical Exam: Blood pressure 111/58, pulse 77, temperature 97.9 F (36.6 C), temperature source Oral, resp. rate  18, SpO2 100.00%.  General appearance: NAD, conversant, elderly and frail, fair historian Eyes: anicteric sclerae, moist conjunctivae; no lid-lag; PERRLA, corneal redness and injection present/chronic appearing HENT: Atraumatic; oropharynx clear with moist mucous membranes and no mucosal ulcerations; normal hard and soft palate Neck: Trachea midline; FROM, supple, no thyromegaly or lymphadenopathy Lungs: diffuse rhonchi, shallow inspiration CV: RRR, no MRGs  Abdomen: Soft, non-tender; no masses or HSM Extremities: No peripheral edema or extremity lymphadenopathy, areas of scattered skin tears and ecymosis Skin: Normal temperature, turgor and texture Psych: Appropriate affect, alert and oriented to person, place (no serious dementia observed)    Labs on Admission:  Results for orders placed during the hospital encounter of 07/29/11 (from the past 48 hour(s))  PROTIME-INR     Status: Abnormal   Collection Time   07/29/11  9:19 AM      Component Value Range Comment     Prothrombin Time 63.4 (*) 11.6 - 15.2 (seconds)    INR 7.30 (*) 0.00 - 1.49    APTT     Status: Abnormal   Collection Time   07/29/11  9:19 AM      Component Value Range Comment   aPTT 83 (*) 24 - 37 (seconds)   CBC     Status: Abnormal   Collection Time   07/29/11  9:19 AM      Component Value Range Comment   WBC 6.0  4.0 - 10.5 (K/uL)    RBC 3.38 (*) 4.22 - 5.81 (MIL/uL)    Hemoglobin 10.7 (*) 13.0 - 17.0 (g/dL)    HCT 65.7 (*) 84.6 - 52.0 (%)    MCV 93.5  78.0 - 100.0 (fL)    MCH 31.7  26.0 - 34.0 (pg)    MCHC 33.9  30.0 - 36.0 (g/dL)    RDW 96.2 (*) 95.2 - 15.5 (%)    Platelets 180  150 - 400 (K/uL)   DIFFERENTIAL     Status: Abnormal   Collection Time   07/29/11  9:19 AM      Component Value Range Comment   Neutrophils Relative 66  43 - 77 (%)    Neutro Abs 4.0  1.7 - 7.7 (K/uL)    Lymphocytes Relative 18  12 - 46 (%)    Lymphs Abs 1.1  0.7 - 4.0 (K/uL)    Monocytes Relative 13 (*) 3 - 12 (%)    Monocytes Absolute 0.8  0.1 - 1.0 (K/uL)    Eosinophils Relative 2  0 - 5 (%)    Eosinophils Absolute 0.1  0.0 - 0.7 (K/uL)    Basophils Relative 0  0 - 1 (%)    Basophils Absolute 0.0  0.0 - 0.1 (K/uL)   COMPREHENSIVE METABOLIC PANEL     Status: Abnormal   Collection Time   07/29/11  9:19 AM      Component Value Range Comment   Sodium 129 (*) 135 - 145 (mEq/L)    Potassium 4.2  3.5 - 5.1 (mEq/L)    Chloride 96  96 - 112 (mEq/L)    CO2 25  19 - 32 (mEq/L)    Glucose, Bld 87  70 - 99 (mg/dL)    BUN 19  6 - 23 (mg/dL)    Creatinine, Ser 8.41  0.50 - 1.35 (mg/dL)    Calcium 8.9  8.4 - 10.5 (mg/dL)    Total Protein 6.9  6.0 - 8.3 (g/dL)    Albumin 2.9 (*) 3.5 - 5.2 (g/dL)  AST 19  0 - 37 (U/L)    ALT 11  0 - 53 (U/L)    Alkaline Phosphatase 63  39 - 117 (U/L)    Total Bilirubin 0.5  0.3 - 1.2 (mg/dL)    GFR calc non Af Amer >90  >90 (mL/min)    GFR calc Af Amer >90  >90 (mL/min)   CK TOTAL AND CKMB     Status: Normal   Collection Time   07/29/11  9:20 AM       Component Value Range Comment   Total CK 70  7 - 232 (U/L)    CK, MB 3.0  0.3 - 4.0 (ng/mL)    Relative Index RELATIVE INDEX IS INVALID  0.0 - 2.5    TROPONIN I     Status: Normal   Collection Time   07/29/11  9:20 AM      Component Value Range Comment   Troponin I <0.30  <0.30 (ng/mL)   GLUCOSE, CAPILLARY     Status: Normal   Collection Time   07/29/11  9:37 AM      Component Value Range Comment   Glucose-Capillary 75  70 - 99 (mg/dL)   POCT I-STAT, CHEM 8     Status: Abnormal   Collection Time   07/29/11  9:41 AM      Component Value Range Comment   Sodium 131 (*) 135 - 145 (mEq/L)    Potassium 4.3  3.5 - 5.1 (mEq/L)    Chloride 99  96 - 112 (mEq/L)    BUN 20  6 - 23 (mg/dL)    Creatinine, Ser 8.11  0.50 - 1.35 (mg/dL)    Glucose, Bld 90  70 - 99 (mg/dL)    Calcium, Ion 9.14 (*) 1.12 - 1.32 (mmol/L)    TCO2 25  0 - 100 (mmol/L)    Hemoglobin 11.6 (*) 13.0 - 17.0 (g/dL)    HCT 78.2 (*) 95.6 - 52.0 (%)   URINALYSIS, ROUTINE W REFLEX MICROSCOPIC     Status: Normal   Collection Time   07/29/11  1:18 PM      Component Value Range Comment   Color, Urine YELLOW  YELLOW     APPearance CLEAR  CLEAR     Specific Gravity, Urine 1.016  1.005 - 1.030     pH 6.0  5.0 - 8.0     Glucose, UA NEGATIVE  NEGATIVE (mg/dL)    Hgb urine dipstick NEGATIVE  NEGATIVE     Bilirubin Urine NEGATIVE  NEGATIVE     Ketones, ur NEGATIVE  NEGATIVE (mg/dL)    Protein, ur NEGATIVE  NEGATIVE (mg/dL)    Urobilinogen, UA 0.2  0.0 - 1.0 (mg/dL)    Nitrite NEGATIVE  NEGATIVE     Leukocytes, UA NEGATIVE  NEGATIVE  MICROSCOPIC NOT DONE ON URINES WITH NEGATIVE PROTEIN, BLOOD, LEUKOCYTES, NITRITE, OR GLUCOSE <1000 mg/dL.    Radiological Exams on Admission: Dg Chest 2 View  07/29/2011  *RADIOLOGY REPORT*  Clinical Data: Very weak.  Weight loss.  CHEST - 2 VIEW  Comparison: 01/17/2011.  Findings: Consolidation right base.  This may represent an infectious infiltrate.  Given the patient's history of weight loss, this  will need to be followed until clearance to exclude underlying mass.  Pacemaker enters from the left with the lead in the region of the right ventricle.  Mild cardiomegaly.  Mild central pulmonary vascular prominence.  No pneumothorax.  Calcified mildly tortuous aorta.  IMPRESSION: Consolidation right base.  This may represent an infiltrate but needs to be followed until clearance to exclude underlying mass in this patient with history of weight loss.  Please see above.  Original Report Authenticated By: Fuller Canada, M.D.   Ct Head Wo Contrast  07/29/2011  *RADIOLOGY REPORT*  Clinical Data: New onset of left-sided weakness, slurred speech.  CT HEAD WITHOUT CONTRAST  Technique:  Contiguous axial images were obtained from the base of the skull through the vertex without contrast.  Comparison: Head CT 01/17/2011.  Findings: No acute intracranial abnormality.  Specifically, no gross evidence to suggest acute infarction.  No mass, mass effect, hydrocephalus or abnormal intra or extra-axial fluid collections. Moderate cerebral and mild cerebellar atrophy with associated ex vacuo dilatation of the ventricular system, similar to priors.  Old lacunar infarct in the left putamen (unchanged).  Mild decreased attenuation of the periventricular white matter of the cerebral hemispheres bilaterally, likely reflects chronic microvascular ischemic changes (similar to prior). Visualized paranasal sinuses and mastoids are well pneumatized.  IMPRESSION:  1.  No acute intracranial abnormality.  Specifically, no gross imaging findings to suggest acute infarction. 2.  Cerebral atrophy and chronic microvascular ischemic changes in the cerebral white matter, similar to prior examination from 01/17/2011.  These results were called by telephone on 07/29/2011  at  9:35 am to  Dr. Audelia Acton , who verbally acknowledged these results.  Original Report Authenticated By: Florencia Reasons, M.D.    Assessment/Plan 1. Weakness/AMS: Initially  a code stroke was called on the patient but the patient was deemed to not be a candidate for TPA. He was out at the time frame and woke up with his symptoms. The symptoms of bowel cyst resolved and he does not have a significant weakness on left side or any focal deficits. A workup for altered renal status included laboratories we should not show any significant findings urinalysis was normal however chest x-ray did show a focal infiltrate in the right lower base which is yet to be characterized. She does have a history of weight loss and failure to thrive. The assessment of infiltrate below. 2. Infiltrate R. Base: Patient is being admitted and being treated empirically for a community-acquired pneumonia. Started Avelox daily he does have a penicillin allergy. I am not certain what the infiltrate is in his right lung base by cxr- he does not have a leukocytosis he has not had fevers -while this doesn't entirely rule out an pneumonia, it is very suspicious for a malignancy given the fact he is relatively free of pneumonia symptoms. He does have a long-term smoking history. We'll give him some IV fluids and watch his renal function and get a CT scan tomorrow pending his labs.  3. Spinal stenosis and chronic pain: I have resumed his home regimen which includes a fentanyl patch as well as hydrocodone for breakthrough pain.  4. Atrial fibrillation rate controlled. Patient has rate controlled atrial fibrillation. Will have pharmacy continue his warfarin and set and keep his INR therapeutic. Cardiac enzymes were negative.  5. Supratherapeutic INR without significant anemia or active bleed. He was given oral vitamin K in the emergency department. As long as there are no signs of bleeding, I will continue to monitor his INR and let it to climb down without intervention. Pharmacy consult for management.  Time Spent on Admission: 70 min  Jayan Raymundo [facility] Pager: [phone] 07/29/2011, 3:13  PM

## 2011-07-29 NOTE — ED Notes (Signed)
CODE STROKE CANCELLED per Rita-stroke RN- [facility] notified

## 2011-07-29 NOTE — ED Notes (Signed)
Per family he had shaking chills and increasing weakness x 2 days saw his dr yesterday and was placed on a zpack for ? Pneu. When CNA went to to get him up and going this am he had bm and started to lean to the left and his eyes were not right so they called ems

## 2011-07-29 NOTE — ED Provider Notes (Signed)
History     CSN: [id number]  Arrival date & time 07/29/11  0907   First MD Initiated Contact with Patient 07/29/11 0915      Chief Complaint  Patient presents with  . Altered Mental Status    (Consider location/radiation/quality/duration/timing/severity/associated sxs/prior treatment) The history is provided by the patient.  Pt states he was in his normal state of health before going to bed last night. He woke this morning with L>R sided weakness which has since improved. Denied HA, fever, chills, SOB, CP, n/v/d, abd pain, urinary symptoms. Pt seen and eval'd by stroke team on arrival and deemed not to be tPA candidate.   Past Medical History  Diagnosis Date  . CHF (congestive heart failure)   . Arthritis   . Pacemaker   . Neuropathy   . Dementia   . Incontinence     Past Surgical History  Procedure Date  . Pacemaker insertion   . Knee surgery     No family history on file.  History  Substance Use Topics  . Smoking status: Former Games developer  . Smokeless tobacco: Not on file  . Alcohol Use: No      Review of Systems  Constitutional: Positive for fatigue. Negative for fever and chills.  HENT: Negative for trouble swallowing, neck pain and voice change.   Eyes: Negative for visual disturbance.  Respiratory: Negative for shortness of breath.   Cardiovascular: Negative for chest pain, palpitations and leg swelling.  Gastrointestinal: Negative for nausea, vomiting, abdominal pain and diarrhea.  Genitourinary: Negative for dysuria and flank pain.  Musculoskeletal: Negative for back pain.  Skin: Negative for color change, pallor, rash and wound.  Neurological: Positive for weakness. Negative for dizziness, numbness and headaches.    Allergies  Contrast media; Penicillins; and Uroxatral  Home Medications   Current Outpatient Rx  Name Route Sig Dispense Refill  . AZITHROMYCIN 250 MG PO TABS Oral Take 250-500 mg by mouth See admin instructions. Take 2 tablets by  mouth the first day, then 1 tablet daily for a total of 5 days.  Started on 07/28/11    . CALCIUM 600/VITAMIN D PO Oral Take 1 tablet by mouth 2 (two) times daily.     . CYCLOBENZAPRINE HCL 10 MG PO TABS Oral Take 10 mg by mouth at bedtime.    . DONEPEZIL HCL 5 MG PO TABS Oral Take 5 mg by mouth at bedtime.     . FENTANYL 25 MCG/HR TD PT72 Transdermal Place 1 patch onto the skin every 3 (three) days.    Marland Kitchen HYDROCODONE-ACETAMINOPHEN 10-500 MG PO TABS Oral Take 1 tablet by mouth every 6 (six) hours as needed. For pain.    Marland Kitchen HYDROCORTISONE ACE-PRAMOXINE 2.5-1 & 1 % RE KIT Rectal Place 1 application rectally 4 (four) times daily as needed. For hemorrhoids.    Marland Kitchen HYOSCYAMINE SULFATE 0.125 MG PO TABS Oral Take 0.125 mg by mouth daily as needed. For stomach cramping    . LEVOTHYROXINE SODIUM 88 MCG PO TABS Oral Take 88 mcg by mouth every morning.    Marland Kitchen LORAZEPAM 0.5 MG PO TABS Oral Take 0.5-1 mg by mouth every 8 (eight) hours as needed. For anxiety    . MECLIZINE HCL 25 MG PO TABS Oral Take 25 mg by mouth 3 (three) times daily as needed. For dizziness    . MEMANTINE HCL 10 MG PO TABS Oral Take 10 mg by mouth 2 (two) times daily.    Marland Kitchen MYRBETRIQ PO Oral Take 25 mg  by mouth at bedtime.     . OMEGA-3-ACID ETHYL ESTERS 1 G PO CAPS Oral Take 1 g by mouth at bedtime.     Marland Kitchen POLYETHYL GLYCOL-PROPYL GLYCOL 0.4-0.3 % OP SOLN Both Eyes Place 1 drop into both eyes 3 (three) times daily as needed. For dry eyes    . SILVER SULFADIAZINE 1 % EX CREA Topical Apply 1 application topically daily. Until wound is clear    . SPIRONOLACTONE 25 MG PO TABS Oral Take 12.5 mg by mouth daily.    Marland Kitchen TEMAZEPAM 15 MG PO CAPS Oral Take 15 mg by mouth at bedtime.    . TOBRAMYCIN-DEXAMETHASONE 0.3-0.1 % OP OINT Both Eyes Place 1 application into both eyes every 6 (six) hours as needed. For eye infection    . TRIAMCINOLONE ACETONIDE 0.1 % EX CREA Topical Apply 1 application topically 2 (two) times daily as needed. For rash    . VITAMIN C 500  MG PO TABS Oral Take 500 mg by mouth at bedtime.     . WARFARIN SODIUM 1 MG PO TABS Oral Take 1 mg by mouth See admin instructions. Take 1 tablet by mouth along with a 5MG  tablet on Tuesday, Thursday, Saturday, and Sunday.    . WARFARIN SODIUM 5 MG PO TABS Oral Take 5 mg by mouth daily. On Tuesday, Thursday, Saturday, and Sunday, take one tablet along with a 1MG  tablet.      BP 118/66  Pulse 83  Temp(Src) 97.9 F (36.6 C) (Oral)  Resp 25  SpO2 95%  Physical Exam  Nursing note and vitals reviewed. Constitutional: He is oriented to person, place, and time. He appears well-developed and well-nourished. No distress.       Frail, dry  HENT:  Head: Normocephalic and atraumatic.  Mouth/Throat: Oropharynx is clear and moist.  Eyes: EOM are normal. Pupils are equal, round, and reactive to light.  Neck: Normal range of motion. Neck supple.  Cardiovascular: Normal rate and regular rhythm.   Pulmonary/Chest: Effort normal and breath sounds normal. No respiratory distress. He has no wheezes. He has no rales.  Abdominal: Soft. Bowel sounds are normal. There is no tenderness. There is no rebound and no guarding.  Musculoskeletal: Normal range of motion. He exhibits no edema and no tenderness.  Neurological: He is alert and oriented to person, place, and time.       4/5 motor LUE/LLE. 5/5 motor RUE/RLE. Sensation intact, CN II-XII intact  Skin: Skin is warm and dry. No rash noted. No erythema.  Psychiatric: He has a normal mood and affect. His behavior is normal.    ED Course  Procedures (including critical care time)  Labs Reviewed  PROTIME-INR - Abnormal; Notable for the following:    Prothrombin Time 63.4 (*)    INR 7.30 (*)    All other components within normal limits  APTT - Abnormal; Notable for the following:    aPTT 83 (*)    All other components within normal limits  CBC - Abnormal; Notable for the following:    RBC 3.38 (*)    Hemoglobin 10.7 (*)    HCT 31.6 (*)    RDW 16.0  (*)    All other components within normal limits  DIFFERENTIAL - Abnormal; Notable for the following:    Monocytes Relative 13 (*)    All other components within normal limits  COMPREHENSIVE METABOLIC PANEL - Abnormal; Notable for the following:    Sodium 129 (*)    Albumin 2.9 (*)  All other components within normal limits  POCT I-STAT, CHEM 8 - Abnormal; Notable for the following:    Sodium 131 (*)    Calcium, Ion 1.11 (*)    Hemoglobin 11.6 (*)    HCT 34.0 (*)    All other components within normal limits  CK TOTAL AND CKMB  TROPONIN I  GLUCOSE, CAPILLARY  URINALYSIS, ROUTINE W REFLEX MICROSCOPIC  CULTURE, BLOOD (ROUTINE X 2)  CULTURE, BLOOD (ROUTINE X 2)   Dg Chest 2 View  07/29/2011  *RADIOLOGY REPORT*  Clinical Data: Very weak.  Weight loss.  CHEST - 2 VIEW  Comparison: 01/17/2011.  Findings: Consolidation right base.  This may represent an infectious infiltrate.  Given the patient's history of weight loss, this will need to be followed until clearance to exclude underlying mass.  Pacemaker enters from the left with the lead in the region of the right ventricle.  Mild cardiomegaly.  Mild central pulmonary vascular prominence.  No pneumothorax.  Calcified mildly tortuous aorta.  IMPRESSION: Consolidation right base.  This may represent an infiltrate but needs to be followed until clearance to exclude underlying mass in this patient with history of weight loss.  Please see above.  Original Report Authenticated By: Fuller Canada, M.D.   Ct Head Wo Contrast  07/29/2011  *RADIOLOGY REPORT*  Clinical Data: New onset of left-sided weakness, slurred speech.  CT HEAD WITHOUT CONTRAST  Technique:  Contiguous axial images were obtained from the base of the skull through the vertex without contrast.  Comparison: Head CT 01/17/2011.  Findings: No acute intracranial abnormality.  Specifically, no gross evidence to suggest acute infarction.  No mass, mass effect, hydrocephalus or abnormal intra  or extra-axial fluid collections. Moderate cerebral and mild cerebellar atrophy with associated ex vacuo dilatation of the ventricular system, similar to priors.  Old lacunar infarct in the left putamen (unchanged).  Mild decreased attenuation of the periventricular white matter of the cerebral hemispheres bilaterally, likely reflects chronic microvascular ischemic changes (similar to prior). Visualized paranasal sinuses and mastoids are well pneumatized.  IMPRESSION:  1.  No acute intracranial abnormality.  Specifically, no gross imaging findings to suggest acute infarction. 2.  Cerebral atrophy and chronic microvascular ischemic changes in the cerebral white matter, similar to prior examination from 01/17/2011.  These results were called by telephone on 07/29/2011  at  9:35 am to  Dr. Audelia Acton , who verbally acknowledged these results.  Original Report Authenticated By: Florencia Reasons, M.D.     1. Pneumonia   2. Weakness   3. Hypercoagulable state      Date: 07/29/2011  Rate: 79  Rhythm: vent-paced  QRS Axis: normal  Intervals: QRS prolonged  ST/T Wave abnormalities: nonspecific ST changes  Conduction Disutrbances:none  Narrative Interpretation: Normal appearing ventricularly-paced EKG  Old EKG Reviewed: unchanged    MDM  Dr Audelia Acton has seen in ED. Recommends hospitalist admission    After speaking with family, pt has had decreasing energy and alertness x 2 days with non-productive cough. Was started on z pak yesterday due to concerns for developing pneumonia. Pt lives at home with no recent hospital admission    Loren Racer, MD 07/29/11 1337

## 2011-07-29 NOTE — ED Notes (Signed)
Pt complaining of 5/10 back pain. Huntley Dec, RN notified.

## 2011-07-29 NOTE — Consult Note (Signed)
Chief Complaint: "left hemiparesis"  HPI: [name] is an 78 y.o. male who woke up this am and fell while trying to get out of bed. He complained of left-sided weakness that he noticed while trying to get out of bed. He was not a t-PA candidate   LSN: 10:00 pm the previous night  tPA Given: No: out of window mRankin: 0  No past medical history on file.  No past surgical history on file.  No family history on file. Social History:  does not have a smoking history on file. He does not have any smokeless tobacco history on file. His alcohol and drug histories not on file.  Allergies:  Allergies  Allergen Reactions  . Contrast Media (Iodinated Diagnostic Agents) Other (See Comments)    Unknown.  Marland Kitchen Penicillins Hives  . Uroxatral (Alfuzosin Hydrochloride) Other (See Comments)    hypotension   Medications: I have reviewed the patient's current medications from list that came with patient - of note, he takes coumadin.  ROS: As above  Physical Examination: Blood pressure 119/72, pulse 75, temperature 97.5 F (36.4 C), temperature source Oral, resp. rate 13, SpO2 95.00%.  Neurologic Examination: MS: AAO*3, no aphasia, followed complex commands CN: EOMI, PERRL, VFF, no face asymmetry, tongue midline, V1-V3 sensation intact b/l, intermittently dysathric Motor: no drift, strength was 5/5 throughout Sensory: reduced sensation to pinprick in left arm Coord: F to N intact b/l Reflexes: 1+ throughout, mute plantars b/l Gait: deferred  Results for orders placed during the hospital encounter of 07/29/11 (from the past 48 hour(s))  GLUCOSE, CAPILLARY     Status: Normal   Collection Time   07/29/11  9:37 AM      Component Value Range Comment   Glucose-Capillary 75  70 - 99 (mg/dL)    Ct Head Wo Contrast  07/29/2011  *RADIOLOGY REPORT*  Clinical Data: New onset of left-sided weakness, slurred speech.  CT HEAD WITHOUT CONTRAST  Technique:  Contiguous axial images were obtained from  the base of the skull through the vertex without contrast.  Comparison: Head CT 01/17/2011.  Findings: No acute intracranial abnormality.  Specifically, no gross evidence to suggest acute infarction.  No mass, mass effect, hydrocephalus or abnormal intra or extra-axial fluid collections. Moderate cerebral and mild cerebellar atrophy with associated ex vacuo dilatation of the ventricular system, similar to priors.  Old lacunar infarct in the left putamen (unchanged).  Mild decreased attenuation of the periventricular white matter of the cerebral hemispheres bilaterally, likely reflects chronic microvascular ischemic changes (similar to prior). Visualized paranasal sinuses and mastoids are well pneumatized.  IMPRESSION:  1.  No acute intracranial abnormality.  Specifically, no gross imaging findings to suggest acute infarction. 2.  Cerebral atrophy and chronic microvascular ischemic changes in the cerebral white matter, similar to prior examination from 01/17/2011.  These results were called by telephone on 07/29/2011  at  9:35 am to  Dr. Audelia Acton , who verbally acknowledged these results.  Original Report Authenticated By: Florencia Reasons, M.D.   Assessment: 78 y.o. male who woke up with left-sided weakness and fell out of bed. LSN the night before - before he fell asleep. Not t-PA candidate due to being out of the window and having a low NIHSS  Stroke Risk Factors - hyperlipidemia  Plan: 1. HgbA1c, fasting lipid panel 2. MRI, MRA  of the brain without contrast 3. PT consult, OT consult, Speech consult 4. Echocardiogram 5. Carotid dopplers 6. Prophylactic therapy-Antiplatelet med: Aspirin - dose 325 mg  PO daily if passes nursing swallow screen 7. Risk factor modification 8. Cardiac monitoring 9. HOB at 30 degrees 10. Neurochecks q4h 11. IV fluids 12. Keep MAP b/w 120 to 130 13. DVT prophylaxis  [name] 07/29/2011, 9:41 AM

## 2011-07-29 NOTE — ED Notes (Signed)
Pt undressed and in a gown. Cardiac monitor, pulse oximetry and blood pressure cuff on. Ekg done.

## 2011-07-29 NOTE — ED Notes (Signed)
Pt provided grape juice and crackers per EDP. Pt passed swallow screen prior to eating/drinkning

## 2011-07-30 ENCOUNTER — Inpatient Hospital Stay (HOSPITAL_COMMUNITY): Payer: Medicare Other

## 2011-07-30 DIAGNOSIS — J90 Pleural effusion, not elsewhere classified: Secondary | ICD-10-CM | POA: Diagnosis not present

## 2011-07-30 DIAGNOSIS — J479 Bronchiectasis, uncomplicated: Secondary | ICD-10-CM | POA: Diagnosis not present

## 2011-07-30 DIAGNOSIS — J438 Other emphysema: Secondary | ICD-10-CM | POA: Diagnosis not present

## 2011-07-30 DIAGNOSIS — J159 Unspecified bacterial pneumonia: Secondary | ICD-10-CM | POA: Diagnosis not present

## 2011-07-30 DIAGNOSIS — R5383 Other fatigue: Secondary | ICD-10-CM | POA: Diagnosis not present

## 2011-07-30 LAB — CBC
MCH: 31.2 pg (ref 26.0–34.0)
MCHC: 33.9 g/dL (ref 30.0–36.0)
MCV: 92 fL (ref 78.0–100.0)
Platelets: 199 10*3/uL (ref 150–400)
RDW: 15.9 % — ABNORMAL HIGH (ref 11.5–15.5)
WBC: 5.4 10*3/uL (ref 4.0–10.5)

## 2011-07-30 LAB — BASIC METABOLIC PANEL
Calcium: 8.5 mg/dL (ref 8.4–10.5)
Chloride: 101 mEq/L (ref 96–112)
Glucose, Bld: 84 mg/dL (ref 70–99)
Potassium: 3.7 mEq/L (ref 3.5–5.1)
Sodium: 133 mEq/L — ABNORMAL LOW (ref 135–145)

## 2011-07-30 MED ORDER — WARFARIN SODIUM 2.5 MG PO TABS
2.5000 mg | ORAL_TABLET | Freq: Once | ORAL | Status: AC
  Administered 2011-07-30: 2.5 mg via ORAL
  Filled 2011-07-30: qty 1

## 2011-07-30 MED ORDER — TOBRAMYCIN-DEXAMETHASONE 0.3-0.1 % OP OINT
1.0000 "application " | TOPICAL_OINTMENT | Freq: Three times a day (TID) | OPHTHALMIC | Status: DC | PRN
  Filled 2011-07-30: qty 3.5

## 2011-07-30 MED ORDER — IOHEXOL 300 MG/ML  SOLN
80.0000 mL | Freq: Once | INTRAMUSCULAR | Status: AC | PRN
  Administered 2011-07-30: 80 mL via INTRAVENOUS

## 2011-07-30 NOTE — Progress Notes (Signed)
   CARE MANAGEMENT NOTE 07/30/2011  Patient:  [name], [name]   Account Number:  [id number]  Date Initiated:  07/30/2011  Documentation initiated by:  Letha Cape  Subjective/Objective Assessment:   dx CAP  admit- lives with spouse     Action/Plan:   pt eval   Anticipated DC Date:  08/02/2011   Anticipated DC Plan:  HOME W HOME HEALTH SERVICES      DC Planning Services  CM consult      Choice offered to / List presented to:             Status of service:  In process, will continue to follow Medicare Important Message given?   (If response is "NO", the following Medicare IM given date fields will be blank) Date Medicare IM given:   Date Additional Medicare IM given:    Discharge Disposition:    Per UR Regulation:    Comments:  07/30/11 12:45 Letha Cape RN, BSN [phone] patient lives with spouse, await pt eval.

## 2011-07-30 NOTE — Progress Notes (Signed)
Subjective: Denies SOB, Chest pain, having pain in his back, asking for sleeping pill.  Objective: Vital signs in last 24 hours: Temp:  [97.9 F (36.6 C)-98.4 F (36.9 C)] 97.9 F (36.6 C) (02/15 1353) Pulse Rate:  [75-79] 77  (02/15 1353) Resp:  [17-18] 17  (02/15 1353) BP: (120-131)/(54-65) 131/65 mmHg (02/15 1353) SpO2:  [99 %-100 %] 100 % (02/15 1353) Weight:  [62.65 kg (138 lb 1.9 oz)] 62.65 kg (138 lb 1.9 oz) (02/15 0317) Weight change:  Last BM Date: 07/30/11  Intake/Output from previous day: 02/14 0701 - 02/15 0700 In: 976 [P.O.:726; IV Piggyback:250] Out: 351 [Urine:351]     Physical Exam: General: Alert, awake, oriented x3, in no acute distress. HEENT: No bruits, no goiter. Heart: Regular rate and rhythm, without murmurs, rubs, gallops. Lungs: scattered crackles, fixed Abdomen: Soft, nontender, nondistended, positive bowel sounds. Extremities:thin, venous stasis Neuro: Grossly intact, nonfocal.  Lab Results: Basic Metabolic Panel:  Basename 07/30/11 0530 07/29/11 0941 07/29/11 0919  NA 133* 131* --  K 3.7 4.3 --  CL 101 99 --  CO2 24 -- 25  GLUCOSE 84 90 --  BUN 14 20 --  CREATININE 0.48* 0.70 --  CALCIUM 8.5 -- 8.9  MG -- -- --  PHOS -- -- --   Liver Function Tests:  Basename 07/29/11 0919  AST 19  ALT 11  ALKPHOS 63  BILITOT 0.5  PROT 6.9  ALBUMIN 2.9*   No results found for this basename: LIPASE:2,AMYLASE:2 in the last 72 hours No results found for this basename: AMMONIA:2 in the last 72 hours CBC:  Basename 07/30/11 0530 07/29/11 0941 07/29/11 0919  WBC 5.4 -- 6.0  NEUTROABS -- -- 4.0  HGB 10.2* 11.6* --  HCT 30.1* 34.0* --  MCV 92.0 -- 93.5  PLT 199 -- 180   Cardiac Enzymes:  Basename 07/29/11 0920  CKTOTAL 70  CKMB 3.0  CKMBINDEX --  TROPONINI <0.30   CBG:  Basename 07/29/11 0937  GLUCAP 75    Basename 07/29/11 1654  TSH 3.468  T4TOTAL --  FREET4 --  T3FREE --  THYROIDAB --   Anemia Panel: No results found for  this basename: VITAMINB12,FOLATE,FERRITIN,TIBC,IRON,RETICCTPCT in the last 72 hours Coagulation:  Basename 07/30/11 0530 07/29/11 0919  LABPROT 20.1* 63.4*  INR 1.68* 7.30*   Urinalysis:  Basename 07/29/11 1318  COLORURINE YELLOW  LABSPEC 1.016  PHURINE 6.0  GLUCOSEU NEGATIVE  HGBUR NEGATIVE  BILIRUBINUR NEGATIVE  KETONESUR NEGATIVE  PROTEINUR NEGATIVE  UROBILINOGEN 0.2  NITRITE NEGATIVE  LEUKOCYTESUR NEGATIVE    Recent Results (from the past 240 hour(s))  CULTURE, BLOOD (ROUTINE X 2)     Status: Normal (Preliminary result)   Collection Time   07/29/11 11:30 AM      Component Value Range Status Comment   Specimen Description BLOOD RIGHT HAND   Final    Special Requests BOTTLES DRAWN AEROBIC ONLY [facility]   Final    Culture  Setup Time [id number]   Final    Culture     Final    Value:        BLOOD CULTURE RECEIVED NO GROWTH TO DATE CULTURE WILL BE HELD FOR 5 DAYS BEFORE ISSUING A FINAL NEGATIVE REPORT   Report Status PENDING   Incomplete   CULTURE, BLOOD (ROUTINE X 2)     Status: Normal (Preliminary result)   Collection Time   07/29/11 11:40 AM      Component Value Range Status Comment   Specimen Description BLOOD LEFT ARM  Final    Special Requests BOTTLES DRAWN AEROBIC AND ANAEROBIC 10CC   Final    Culture  Setup Time [id number]   Final    Culture     Final    Value: GRAM POSITIVE COCCI IN CLUSTERS     Note: Gram Stain Report Called to,Read Back By and Verified With: LAUREN @ 1824 ON 07/30/11 BY GOLLD   Report Status PENDING   Incomplete     Studies/Results: Dg Chest 2 View  07/29/2011  *RADIOLOGY REPORT*  Clinical Data: Very weak.  Weight loss.  CHEST - 2 VIEW  Comparison: 01/17/2011.  Findings: Consolidation right base.  This may represent an infectious infiltrate.  Given the patient's history of weight loss, this will need to be followed until clearance to exclude underlying mass.  Pacemaker enters from the left with the lead in the region of the right ventricle.   Mild cardiomegaly.  Mild central pulmonary vascular prominence.  No pneumothorax.  Calcified mildly tortuous aorta.  IMPRESSION: Consolidation right base.  This may represent an infiltrate but needs to be followed until clearance to exclude underlying mass in this patient with history of weight loss.  Please see above.  Original Report Authenticated By: Fuller Canada, M.D.   Ct Head Wo Contrast  07/29/2011  *RADIOLOGY REPORT*  Clinical Data: New onset of left-sided weakness, slurred speech.  CT HEAD WITHOUT CONTRAST  Technique:  Contiguous axial images were obtained from the base of the skull through the vertex without contrast.  Comparison: Head CT 01/17/2011.  Findings: No acute intracranial abnormality.  Specifically, no gross evidence to suggest acute infarction.  No mass, mass effect, hydrocephalus or abnormal intra or extra-axial fluid collections. Moderate cerebral and mild cerebellar atrophy with associated ex vacuo dilatation of the ventricular system, similar to priors.  Old lacunar infarct in the left putamen (unchanged).  Mild decreased attenuation of the periventricular white matter of the cerebral hemispheres bilaterally, likely reflects chronic microvascular ischemic changes (similar to prior). Visualized paranasal sinuses and mastoids are well pneumatized.  IMPRESSION:  1.  No acute intracranial abnormality.  Specifically, no gross imaging findings to suggest acute infarction. 2.  Cerebral atrophy and chronic microvascular ischemic changes in the cerebral white matter, similar to prior examination from 01/17/2011.  These results were called by telephone on 07/29/2011  at  9:35 am to  Dr. Audelia Acton , who verbally acknowledged these results.  Original Report Authenticated By: Florencia Reasons, M.D.   Ct Chest W Contrast  07/30/2011  *RADIOLOGY REPORT*  Clinical Data: Consolidation. Mass versus pneumonia. consolidation/effusion. Pneumonia unlikely clinically.  CT CHEST WITH CONTRAST  Technique:   Multidetector CT imaging of the chest was performed following the standard protocol during bolus administration of intravenous contrast.  Contrast: 80mL OMNIPAQUE IOHEXOL 300 MG/ML IV SOLN  Comparison: Radiograph 07/29/2011.  Findings: Cardiomegaly.  Left subclavian cardiac pacemaker lead with the tip terminating in the right ventricular apex.  There is no pericardial effusion.  Aortic annular and valvular calcification. Coronary artery atherosclerosis is present. If office based assessment of coronary risk factors has not been performed, it is now recommended.  Enlargement of the pulmonary artery compatible with pulmonary arterial hypertension.  Tiny bilateral pleural effusions are present.  Partially calcified pleural apical scarring is present, bilateral pleural apical plaques are present.  Calcification is greater on the right than left.  Aortic atherosclerosis without acute abnormality.  Incidental imaging the upper abdomen demonstrates a small amount of ascites, suggesting the presence of volume overload.  Splenic granuloma is present.  No pericardial effusion is present.  Patulous thoracic esophagus.  The lower lobes demonstrate collapse / consolidation bilaterally. There is elevation of the right hemidiaphragm and colonic interposition between the posterior right hepatic dome and the hemidiaphragm accounting for mixed soft tissue and gas appearance on prior radiograph.  Air bronchograms are present in the right lower lobe that demonstrates collapse / consolidation.  No discrete mass is identified although it cannot be excluded based on collapse / consolidation.  The lungs demonstrate emphysema.  Bilateral lower lobe cylindrical bronchiectasis is present.  Lingular scarring.  Chronic multilevel thoracic compression fractures are present with a dextroconvex thoracic scoliosis. Per CMS PQRS reporting requirements (PQRS Measure 24): Given the patient's age of greater than 50 and the fracture site (hip, distal  radius, or spine), the patient should be tested for osteoporosis using DXA, and the appropriate treatment considered based on the DXA results.  Compared to prior chest CT 04/14/2010, pleural effusions have improved.  Colonic diaphragmatic interposition appears little changed.  IMPRESSION: 1.  Bilateral right greater than left lower lobe collapse / consolidation with tiny bilateral pleural effusions. 2.  Cardiomegaly and pulmonary arterial hypertension.  Small effusions and this small amount of ascites which is partially visualized in the upper abdomen suggest volume overload. 3.  Atherosclerosis and coronary artery disease. 4.  Heterogeneous opacity on prior chest radiograph represents a combination of colonic diaphragmatic interposition and right lower lobe collapse / consolidation. 5.  Bilateral apical pleural plaques.  This suggests asbestos related pleural disease. 6.  Emphysema.  Lower lobe cylindrical bronchiectasis.  Original Report Authenticated By: Andreas Newport, M.D.    Medications: Scheduled Meds:   . cyclobenzaprine  10 mg Oral QHS  . donepezil  5 mg Oral QHS  . fentaNYL  25 mcg Transdermal Q72H  . levothyroxine  88 mcg Oral Q breakfast  . memantine  10 mg Oral BID  . moxifloxacin  400 mg Intravenous Q24H  . warfarin  2.5 mg Oral ONCE-1800  . DISCONTD: tobramycin-dexamethasone  1 application Both Eyes TID   Continuous Infusions:  PRN Meds:.acetaminophen, acetaminophen, albuterol, alum & mag hydroxide-simeth, HYDROcodone-acetaminophen, hyoscyamine, iohexol, LORazepam, ondansetron (ZOFRAN) IV, ondansetron, polyvinyl alcohol, senna-docusate, tobramycin-dexamethasone  Assessment/Plan:  Principal Problem:  *CAP (community acquired pneumonia) Active Problems:  Hyperthyroidism  Spinal stenosis  Chronic pain  1. PNA, CT negative for mas or obvious malignancy, positive for COPD, and pleural diseasese, continue IV rocephin and azitho for now.  2. Chronic Pain-spinal stenopsis, resumed  home medications/fentanyl patch and prn hydrocodone  3. Weakness/ AMS: was initially w/u as possible TIA stoke, CT of the head was negative and symptoms resolved. He is already on Coumadin therapy. A-fib rate controlled stable.  4. Dementia, mild, well compensated, he tells me that he has in home care providers who if needed can provide care 24/7. Plan is to d/c home with [facility] services and PT.  5. Supratherapuetic INR: on admission was 7.3 now 1.6 , will resume regular coumadin dosing and he will need inr check on Monday.   LOS: 1 day   [facility] Pager: [phone] 07/30/2011, 9:46 PM

## 2011-07-30 NOTE — Evaluation (Signed)
Physical Therapy Evaluation Patient Details Name: [name] MRN: [id number] DOB: Oct 29, 1922 Today's Date: 07/30/2011  Problem List:  Patient Active Problem List  Diagnoses  . HYPOTHYROIDISM  . DEMENTIA  . CAD  . ATRIAL FIBRILLATION  . PLEURAL EFFUSION  . Hyperthyroidism  . Spinal stenosis  . Chronic pain  . CAP (community acquired pneumonia)    Past Medical History:  Past Medical History  Diagnosis Date  . CHF (congestive heart failure)   . Arthritis   . Pacemaker   . Incontinence   . Pneumonia 2011; 07/29/11  . Neuropathy   . Heart murmur   . Dysrhythmia     paced  . Hypothyroidism   . Restless leg syndrome   . Blood transfusion   . Anemia   . Lower GI bleeding 2011    "from ATB I wasn't suppose to have had ordered"  . Dementia 07/29/11     not observed by this RN   Past Surgical History:  Past Surgical History  Procedure Date  . Insert / replace / remove pacemaker ~ 2006    initial placement  . Insert / replace / remove pacemaker 05/2009  . Knee arthroscopy ~ 2000's    right  . Back surgery     "as a kid"  . Inguinal hernia repair 1960's    left  . Inguinal hernia repair 1973    right    PT Assessment/Plan/Recommendation PT Assessment Clinical Impression Statement: Pt admitted with weakness and family present throughout eval. Pt has assist for all mobility and ADLs at home and is very near baseline currently. Pt currently receiving HHPT and is safe to return home with resumption of those services. Should he remain in the acute setting will follow to maximize gait and transfers before return home to decrease burden of care. PT Recommendation/Assessment: Patient will need skilled PT in the acute care venue PT Problem List: Decreased range of motion;Decreased strength;Decreased knowledge of use of DME;Decreased activity tolerance;Decreased balance;Decreased mobility Barriers to Discharge: None PT Therapy Diagnosis : Abnormality of gait;Difficulty  walking;Generalized weakness PT Plan PT Frequency: Min 2X/week PT Treatment/Interventions: Gait training;DME instruction;Stair training;Functional mobility training;Therapeutic activities;Therapeutic exercise;Patient/family education;Balance training PT Recommendation Follow Up Recommendations: Home health PT Equipment Recommended: None recommended by PT PT Goals  Acute Rehab PT Goals PT Goal Formulation: With patient/family Pt will go Sit to Stand: with min assist;Other (comment) (minguard) PT Goal: Sit to Stand - Progress: Goal set today Pt will go Stand to Sit: with min assist;Other (comment) (minguard) PT Goal: Stand to Sit - Progress: Goal set today Pt will Ambulate: 16 - 50 feet;with min assist;with rolling walker PT Goal: Ambulate - Progress: Goal set today Pt will Go Up / Down Stairs: 1-2 stairs;with mod assist;with least restrictive assistive device PT Goal: Up/Down Stairs - Progress: Goal set today  PT Evaluation Precautions/Restrictions  Precautions Precautions: Fall Prior Functioning  Home Living Lives With: Spouse Receives Help From: Personal care attendant Type of Home: House Home Layout: Multi-level Alternate Level Stairs-Number of Steps: flight, pt has stair chair Home Access: Stairs to enter Entrance Stairs-Rails: Right;Can reach both;Left Entrance Stairs-Number of Steps: 2 , pt completes with assistance of 2 people for stairs Bathroom Shower/Tub: Walk-in shower Home Adaptive Equipment: Hospital bed;Walker - rolling;Other (comment);Wheelchair - manual;Bedside commode/3-in-1 (chair lift) Additional Comments: 12 hr CNA 5hrs/day, 4hrs on Sat and Sun. She puts him to bed through a routine and comes back to get him up dressed and bathed in the morning every day.  CNA assists with transfers OOB and getting pt to kitchen. Pt limited to 25 ft ambulation. Pt has a condom catheter at night.  Prior Function Level of Independence: Needs assistance with ADLs;Needs assistance  with tranfers;Needs assistance with gait;Needs assistance with homemaking Bath: Maximal Toileting: Total Dressing: Total Grooming: Total Meal Prep: Total Light Housekeeping: Total Driving: No Vocation: Retired Producer, television/film/video: Awake/alert Orientation Level: Oriented to person;Oriented to place;Oriented to time;Oriented to situation Sensation/Coordination Sensation Light Touch: Not tested Extremity Assessment RLE Assessment RLE Assessment: Exceptions to [facility] RLE Strength RLE Overall Strength Comments: pt grossly 2/5 with bil knees maintained in flexion grossly 10 degrees LLE Assessment LLE Assessment: Exceptions to [facility] LLE Strength LLE Overall Strength Comments: pt grossly 2/5 with bil knees maintained in flexion grossly 10 degrees Mobility (including Balance) Bed Mobility Bed Mobility: Yes Rolling Left: 5: Supervision;With rail Rolling Left Details (indicate cue type and reason): cueing to sequence Left Sidelying to Sit: 4: Min assist;HOB flat Left Sidelying to Sit Details (indicate cue type and reason): cueing to complete Sitting - Scoot to Edge of Bed: 5: Supervision Transfers Sit to Stand: 4: Min assist;From bed;From chair/3-in-1 Sit to Stand Details (indicate cue type and reason): x2 trials with cueing for hand placement and safety Stand to Sit: 4: Min assist;To chair/3-in-1;With armrests Stand to Sit Details: cueing for hand placement and assist to achieve hip flexion pt with tendency to keep hips too extended and lean back to attempt to sit, assist to control descent Ambulation/Gait Ambulation/Gait: Yes Ambulation/Gait Assistance: 4: Min assist Ambulation/Gait Assistance Details (indicate cue type and reason): minguard assist  Ambulation Distance (Feet): 25 Feet Assistive device: Rolling walker Gait Pattern: Decreased stride length;Trunk flexed;Shuffle Gait velocity: slowly Stairs: No  Posture/Postural Control Posture/Postural Control:  Postural limitations Postural Limitations: flexed trunk Balance Balance Assessed: Yes Static Sitting Balance Static Sitting - Balance Support: Bilateral upper extremity supported;Feet supported Static Sitting - Level of Assistance: 5: Stand by assistance Static Sitting - Comment/# of Minutes: 3 Exercise    End of Session PT - End of Session Equipment Utilized During Treatment: Gait belt Activity Tolerance: Patient tolerated treatment well Patient left: in chair;with call bell in reach;with family/visitor present Nurse Communication: Mobility status for transfers;Mobility status for ambulation General Behavior During Session: [facility] for tasks performed Cognition: [facility] for tasks performed  Delorse Lek 07/30/2011, 4:35 PM  Toney Sang, PT [phone]

## 2011-07-30 NOTE — Progress Notes (Signed)
Utilization review completed.  

## 2011-07-30 NOTE — Progress Notes (Signed)
ANTICOAGULATION CONSULT NOTE - Follow Up Consult  Pharmacy Consult for Coumadin Indication: atrial fibrillation  Allergies  Allergen Reactions  . Contrast Media (Iodinated Diagnostic Agents) Other (See Comments)    Unknown.  Marland Kitchen Penicillins Hives  . Uroxatral (Alfuzosin Hydrochloride) Other (See Comments)    hypotension    Vital Signs: Temp: 98.4 F (36.9 C) (02/15 0546) Temp src: Oral (02/14 2215) BP: 124/64 mmHg (02/15 0546) Pulse Rate: 75  (02/15 0546)  Labs:  Basename 07/30/11 0530 07/29/11 0941 07/29/11 0920 07/29/11 0919  HGB 10.2* 11.6* -- --  HCT 30.1* 34.0* -- 31.6*  PLT 199 -- -- 180  APTT -- -- -- 83*  LABPROT 20.1* -- -- 63.4*  INR 1.68* -- -- 7.30*  HEPARINUNFRC -- -- -- --  CREATININE 0.48* 0.70 -- 0.50  CKTOTAL -- -- 70 --  CKMB -- -- 3.0 --  TROPONINI -- -- <0.30 --    Medical History: Past Medical History  Diagnosis Date  . CHF (congestive heart failure)   . Arthritis   . Pacemaker   . Incontinence   . Pneumonia 2011; 07/29/11  . Neuropathy   . Heart murmur   . Dysrhythmia     paced  . Hypothyroidism   . Restless leg syndrome   . Blood transfusion   . Anemia   . Lower GI bleeding 2011    "from ATB I wasn't suppose to have had ordered"  . Dementia 07/29/11     not observed by this RN    Medications:  Prescriptions prior to admission  Medication Sig Dispense Refill  . azithromycin (ZITHROMAX) 250 MG tablet Take 250-500 mg by mouth See admin instructions. Take 2 tablets by mouth the first day, then 1 tablet daily for a total of 5 days.  Started on 07/28/11      . Calcium Carbonate-Vitamin D (CALCIUM 600/VITAMIN D PO) Take 1 tablet by mouth 2 (two) times daily.       . cyclobenzaprine (FLEXERIL) 10 MG tablet Take 10 mg by mouth at bedtime.      . donepezil (ARICEPT) 5 MG tablet Take 5 mg by mouth at bedtime.       . fentaNYL (DURAGESIC - DOSED MCG/HR) 25 MCG/HR Place 1 patch onto the skin every 3 (three) days.      Marland Kitchen HYDROcodone-acetaminophen  (LORTAB) 10-500 MG per tablet Take 1 tablet by mouth every 6 (six) hours as needed. For pain.      Marland Kitchen Hydrocortisone Ace-Pramoxine 2.5-1 & 1 % KIT Place 1 application rectally 4 (four) times daily as needed. For hemorrhoids.      . hyoscyamine (LEVSIN, ANASPAZ) 0.125 MG tablet Take 0.125 mg by mouth daily as needed. For stomach cramping      . levothyroxine (SYNTHROID, LEVOTHROID) 88 MCG tablet Take 88 mcg by mouth every morning.      Marland Kitchen LORazepam (ATIVAN) 0.5 MG tablet Take 0.5-1 mg by mouth every 8 (eight) hours as needed. For anxiety      . memantine (NAMENDA) 10 MG tablet Take 10 mg by mouth 2 (two) times daily.      . Mirabegron (MYRBETRIQ PO) Take 25 mg by mouth at bedtime.       Marland Kitchen omega-3 acid ethyl esters (LOVAZA) 1 G capsule Take 1 g by mouth at bedtime.       Bertram Gala Glycol-Propyl Glycol (SYSTANE) 0.4-0.3 % SOLN Place 1 drop into both eyes 3 (three) times daily as needed. For dry eyes      .  silver sulfADIAZINE (SILVADENE) 1 % cream Apply 1 application topically daily. Until wound is clear      . spironolactone (ALDACTONE) 25 MG tablet Take 12.5 mg by mouth daily.      . temazepam (RESTORIL) 15 MG capsule Take 15 mg by mouth at bedtime.      Marland Kitchen tobramycin-dexamethasone (TOBRADEX) ophthalmic ointment Place 1 application into both eyes every 6 (six) hours as needed. For eye infection      . triamcinolone cream (KENALOG) 0.1 % Apply 1 application topically 2 (two) times daily as needed. For rash      . vitamin C (ASCORBIC ACID) 500 MG tablet Take 500 mg by mouth at bedtime.       Marland Kitchen warfarin (COUMADIN) 1 MG tablet Take 1 mg by mouth See admin instructions. Take 1 tablet by mouth along with a 5MG  tablet on Tuesday, Thursday, Saturday, and Sunday.      . warfarin (COUMADIN) 5 MG tablet Take 5 mg by mouth daily. On Tuesday, Thursday, Saturday, and Sunday, take one tablet along with a 1MG  tablet.      Marland Kitchen DISCONTD: meclizine (ANTIVERT) 25 MG tablet Take 25 mg by mouth 3 (three) times daily as  needed. For dizziness         Admit Compaint: 78 yo M admitted 07/29/11 with AMS.  Pharmacy consulted to continue his chronic Coumadin therapy for AFib.    INR was supratherapeutic on admission and Vit K 5 mg IV was given.  He states he takes 5 mg daily except 6 mg on MWF.    Assessment Anticoagulation: Atrial fibrillation, INR now subtherapeutic after vitamin K, given concurrent antibiotics will give 5mg   Infectious Disease: CAP, d#1 , avelox, dose appropriate.   Cardiovascular: CHF, pacer: Lovaza,  Endocrinology: hypothyroid: synthroid 88 mcg Neurology: restless leg syndrome, dementia: fentanyl patch, PRN norco, donepizil, namenda, flexeril Hematology / Oncology: anemia PTA Medication Issues: Home medications not ordered:  Mirabegron, vitamin C, calcium Best Practices: DVT Px: warfarin  Goal of Therapy:  INR 2-3   Plan:  1.  Warfarin 2.5 mg today 2.  Daily PT/INR, F/U in AM 3.  Consider lower home dose on discharge with admission INR of over 7.  Lovenia Kim Pharm.D., BCPS Clinical Pharmacist 07/30/2011 9:53 AM Pager: [phone] Phone: [phone]

## 2011-07-30 NOTE — Progress Notes (Signed)
[facility] PROGRESS NOTE    SUBJECTIVE  Patient is feeling well this am.  He denies headache, tingling, numbness, weakness, visual change, or other complaint.  OBJECTIVE   Vital signs in last 24 hours: Temp:  [97.5 F (36.4 C)-98.4 F (36.9 C)] 98.4 F (36.9 C) (02/15 0546) Pulse Rate:  [70-83] 75  (02/15 0546) Resp:  [15-25] 18  (02/15 0546) BP: (106-124)/(54-66) 124/64 mmHg (02/15 0546) SpO2:  [95 %-100 %] 99 % (02/15 0546) Weight:  [138 lb 1.9 oz (62.65 kg)] 138 lb 1.9 oz (62.65 kg) (02/15 0317)  Intake/Output from previous day: 02/14 0701 - 02/15 0700 In: 1126 [P.O.:726; IV Piggyback:400] Out: 351 [Urine:351] Intake/Output this shift: Total I/O In: -  Out: 1 [Stool:1] Nutritional status: General  Past Medical History  Diagnosis Date  . CHF (congestive heart failure)   . Arthritis   . Pacemaker   . Incontinence   . Pneumonia 2011; 07/29/11  . Neuropathy   . Heart murmur   . Dysrhythmia     paced  . Hypothyroidism   . Restless leg syndrome   . Blood transfusion   . Anemia   . Lower GI bleeding 2011    "from ATB I wasn't suppose to have had ordered"  . Dementia 07/29/11     not observed by this RN    Neurologic Exam:  Mental Status: Alert and oriented x 4.  thought content appropriate.  Speech fluent without evidence of aphasia.  Able to follow 3 step commands without difficulty. Cranial Nerves: II: visual fields grossly normal, pupils equal, round, reactive to light and accommodation III,IV, VI: ptosis not present, extraocular muscles extra-ocular motions intact bilaterally V,VII: smile symmetric, facial light touch sensation normal bilaterally VIII: hearing normal bilaterally IX,X: gag reflex present XI: trapezius strength/neck flexion strength normal bilaterally XII: tongue strength normal  Motor: tone normal, no atrophy.  Initiation of movement is somewhat slowed. Right : Upper extremity    Left:      Upper extremity 4/5 deltoid       4/5 deltoid 4/5 tricep      4/5 tricep 4/5 biceps      4/5 biceps  4/5 hand grip      4/5 hand grip  Lower extremity     Lower extremity 3/5 hip flexor      4/5 hip flexor 4/5 quadricep      4/5 quadriceps  4/5 hamstrings     4/5 hamstrings 4/5 plantar flexion       4/5 plantar flexion 4/5 plantar extension     4/5 plantar extension  Sensory: Pinprick and light touch intact throughout, bilaterally Deep Tendon Reflexes: 2+ and symmetric throughout; achilles absent bilaterally Plantars: equivocal bilaterally Cerebellar: normal finger-to-nose   Studies/Results: Dg Chest 2 View  07/29/2011  *RADIOLOGY REPORT*  Clinical Data: Very weak.  Weight loss.  CHEST - 2 VIEW  Comparison: 01/17/2011.  Findings: Consolidation right base.  This may represent an infectious infiltrate.  Given the patient's history of weight loss, this will need to be followed until clearance to exclude underlying mass.  Pacemaker enters from the left with the lead in the region of the right ventricle.  Mild cardiomegaly.  Mild central pulmonary vascular prominence.  No pneumothorax.  Calcified mildly tortuous aorta.  IMPRESSION: Consolidation right base.  This may represent an infiltrate but needs to be followed until clearance to exclude underlying mass in this patient with history of weight loss.  Please see above.  Original Report Authenticated By: Fuller Canada, M.D.   Ct Head Wo Contrast  07/29/2011  *RADIOLOGY REPORT*  Clinical Data: New onset of left-sided weakness, slurred speech.  CT HEAD WITHOUT CONTRAST  Technique:  Contiguous axial images were obtained from the base of the skull through the vertex without contrast.  Comparison: Head CT 01/17/2011.  Findings: No acute intracranial abnormality.  Specifically, no gross evidence to suggest acute infarction.  No mass, mass effect, hydrocephalus or abnormal intra or extra-axial fluid collections. Moderate cerebral and mild cerebellar  atrophy with associated ex vacuo dilatation of the ventricular system, similar to priors.  Old lacunar infarct in the left putamen (unchanged).  Mild decreased attenuation of the periventricular white matter of the cerebral hemispheres bilaterally, likely reflects chronic microvascular ischemic changes (similar to prior). Visualized paranasal sinuses and mastoids are well pneumatized.  IMPRESSION:  1.  No acute intracranial abnormality.  Specifically, no gross imaging findings to suggest acute infarction. 2.  Cerebral atrophy and chronic microvascular ischemic changes in the cerebral white matter, similar to prior examination from 01/17/2011.  These results were called by telephone on 07/29/2011  at  9:35 am to  Dr. Audelia Acton , who verbally acknowledged these results.  Original Report Authenticated By: Florencia Reasons, M.D.    Medications:    I have reviewed the patient's current medications.  Assessment/Plan:   Weakness/AMS:  Mr. Carignan does not have any focal deficits on exam and his mental status is improved.  His CT head demonstrated atrophy and chronic microvascular changes and did not reveal evidence of hemorrhage, mass, or other abnormality.  When discussing his work up for possible CVA, the patient states he does not which to proceed with an MRI.  He has had an MRI in the past and states "it took a lot out of him."  He understands that an MRI may be the only diagnostic test to definitively determine if he has had a stroke.  He will be resumed on coumadin today; INR is subtherapeutic  INR today of 1.68 following treatment with vitamin K at admission for supratherapeutic INR.  Recommendations: - He may still benefit from risk stratification and statin therapy, consider FLP and A1c - Will not proceed with MRI/MRA per patient preference - PT/OT - Consider carotid dopplers - 2D echo will not change management as pt already on coumadin with known a.fib - Will sign off and remain available for  questions  Nelda Bucks, PGY-3 [phone]   07/30/2011, 10:54 AM

## 2011-07-30 NOTE — Progress Notes (Signed)
Spoke with patient's daughter and wife. They deny that he has an allergy to contrast dye. Pt. States he has had 3 CT's in 2 years and has no recollection of problems with contrast. Irena Reichmann  07/30/2011

## 2011-07-30 NOTE — Progress Notes (Signed)
Mary with [facility] checked to see if patient was active with them, she states patient is not.  Informed her that if patient needed any hh services he would like to work with them, will await pt /ot eval.

## 2011-07-30 NOTE — Progress Notes (Signed)
   CARE MANAGEMENT NOTE 07/30/2011  Patient:  [name], [name]   Account Number:  [id number]  Date Initiated:  07/30/2011  Documentation initiated by:  Letha Cape  Subjective/Objective Assessment:   dx CAP  admit- lives with spouse     Action/Plan:   pt eval   Anticipated DC Date:  08/02/2011   Anticipated DC Plan:  HOME W HOME HEALTH SERVICES      DC Planning Services  CM consult      Choice offered to / List presented to:             Status of service:  In process, will continue to follow Medicare Important Message given?   (If response is "NO", the following Medicare IM given date fields will be blank) Date Medicare IM given:   Date Additional Medicare IM given:    Discharge Disposition:    Per UR Regulation:    Comments:  07/30/11 12:45 Letha Cape RN, BSN [phone] patient lives with spouse, await pt eval.  Patient states he is active with [facility]  and would like to cont with [facility], he has medicare part D for his medications, and his family will be his transportation at dc.  Patient states he also has a pcs person that has been with him for years now.  I called Mary with [facility] to make sure pt is active with [facility] , she will call me back with info.

## 2011-07-31 DIAGNOSIS — J159 Unspecified bacterial pneumonia: Secondary | ICD-10-CM | POA: Diagnosis not present

## 2011-07-31 DIAGNOSIS — R5381 Other malaise: Secondary | ICD-10-CM | POA: Diagnosis not present

## 2011-07-31 LAB — CULTURE, BLOOD (ROUTINE X 2): Culture  Setup Time: 201302141816

## 2011-07-31 MED ORDER — POLYETHYLENE GLYCOL 3350 17 G PO PACK
17.0000 g | PACK | Freq: Every day | ORAL | Status: DC
  Administered 2011-07-31 – 2011-08-01 (×2): 17 g via ORAL
  Filled 2011-07-31 (×3): qty 1

## 2011-07-31 MED ORDER — TEMAZEPAM 15 MG PO CAPS
15.0000 mg | ORAL_CAPSULE | Freq: Every day | ORAL | Status: DC
  Administered 2011-07-31: 15 mg via ORAL
  Filled 2011-07-31: qty 1

## 2011-07-31 MED ORDER — WARFARIN SODIUM 5 MG PO TABS
5.0000 mg | ORAL_TABLET | Freq: Once | ORAL | Status: AC
  Administered 2011-07-31: 5 mg via ORAL
  Filled 2011-07-31: qty 1

## 2011-07-31 NOTE — Progress Notes (Signed)
ANTICOAGULATION CONSULT NOTE - Follow Up Consult  Pharmacy Consult for Coumadin Indication: atrial fibrillation  Allergies  Allergen Reactions  . Penicillins Hives  . Uroxatral (Alfuzosin Hydrochloride) Other (See Comments)    hypotension    Vital Signs: Temp: 97.9 F (36.6 C) (02/16 0543) BP: 126/68 mmHg (02/16 0543) Pulse Rate: 83  (02/16 0543)  Labs:  Basename 07/31/11 4098 07/30/11 0530 07/29/11 0941 07/29/11 0920 07/29/11 0919  HGB -- 10.2* 11.6* -- --  HCT -- 30.1* 34.0* -- 31.6*  PLT -- 199 -- -- 180  APTT -- -- -- -- 83*  LABPROT 18.0* 20.1* -- -- 63.4*  INR 1.46 1.68* -- -- 7.30*  HEPARINUNFRC -- -- -- -- --  CREATININE -- 0.48* 0.70 -- 0.50  CKTOTAL -- -- -- 70 --  CKMB -- -- -- 3.0 --  TROPONINI -- -- -- <0.30 --    Medical History: Past Medical History  Diagnosis Date  . CHF (congestive heart failure)   . Arthritis   . Pacemaker   . Incontinence   . Pneumonia 2011; 07/29/11  . Neuropathy   . Heart murmur   . Dysrhythmia     paced  . Hypothyroidism   . Restless leg syndrome   . Blood transfusion   . Anemia   . Lower GI bleeding 2011    "from ATB I wasn't suppose to have had ordered"  . Dementia 07/29/11     not observed by this RN    Medications:  Prescriptions prior to admission  Medication Sig Dispense Refill  . azithromycin (ZITHROMAX) 250 MG tablet Take 250-500 mg by mouth See admin instructions. Take 2 tablets by mouth the first day, then 1 tablet daily for a total of 5 days.  Started on 07/28/11      . Calcium Carbonate-Vitamin D (CALCIUM 600/VITAMIN D PO) Take 1 tablet by mouth 2 (two) times daily.       . cyclobenzaprine (FLEXERIL) 10 MG tablet Take 10 mg by mouth at bedtime.      . donepezil (ARICEPT) 5 MG tablet Take 5 mg by mouth at bedtime.       . fentaNYL (DURAGESIC - DOSED MCG/HR) 25 MCG/HR Place 1 patch onto the skin every 3 (three) days.      Marland Kitchen HYDROcodone-acetaminophen (LORTAB) 10-500 MG per tablet Take 1 tablet by mouth every  6 (six) hours as needed. For pain.      Marland Kitchen Hydrocortisone Ace-Pramoxine 2.5-1 & 1 % KIT Place 1 application rectally 4 (four) times daily as needed. For hemorrhoids.      . hyoscyamine (LEVSIN, ANASPAZ) 0.125 MG tablet Take 0.125 mg by mouth daily as needed. For stomach cramping      . levothyroxine (SYNTHROID, LEVOTHROID) 88 MCG tablet Take 88 mcg by mouth every morning.      Marland Kitchen LORazepam (ATIVAN) 0.5 MG tablet Take 0.5-1 mg by mouth every 8 (eight) hours as needed. For anxiety      . memantine (NAMENDA) 10 MG tablet Take 10 mg by mouth 2 (two) times daily.      . Mirabegron (MYRBETRIQ PO) Take 25 mg by mouth at bedtime.       Marland Kitchen omega-3 acid ethyl esters (LOVAZA) 1 G capsule Take 1 g by mouth at bedtime.       Bertram Gala Glycol-Propyl Glycol (SYSTANE) 0.4-0.3 % SOLN Place 1 drop into both eyes 3 (three) times daily as needed. For dry eyes      . silver sulfADIAZINE (SILVADENE) 1 %  cream Apply 1 application topically daily. Until wound is clear      . spironolactone (ALDACTONE) 25 MG tablet Take 12.5 mg by mouth daily.      . temazepam (RESTORIL) 15 MG capsule Take 15 mg by mouth at bedtime.      Marland Kitchen tobramycin-dexamethasone (TOBRADEX) ophthalmic ointment Place 1 application into both eyes every 6 (six) hours as needed. For eye infection      . triamcinolone cream (KENALOG) 0.1 % Apply 1 application topically 2 (two) times daily as needed. For rash      . vitamin C (ASCORBIC ACID) 500 MG tablet Take 500 mg by mouth at bedtime.       Marland Kitchen warfarin (COUMADIN) 1 MG tablet Take 1 mg by mouth See admin instructions. Take 1 tablet by mouth along with a 5MG  tablet on Tuesday, Thursday, Saturday, and Sunday.      . warfarin (COUMADIN) 5 MG tablet Take 5 mg by mouth daily. On Tuesday, Thursday, Saturday, and Sunday, take one tablet along with a 1MG  tablet.      Marland Kitchen DISCONTD: meclizine (ANTIVERT) 25 MG tablet Take 25 mg by mouth 3 (three) times daily as needed. For dizziness         Admit Compaint: 78 yo M  admitted 07/29/11 with AMS.  Pharmacy consulted to continue his chronic Coumadin therapy for AFib.  INR was supratherapeutic on admission and Vit K 5 mg IV was given.  He states he takes 5 mg daily except 6 mg on MWF.  Today's INR subtherapeutic, will likely take a few days to overcome effects of Vit. K.  Goal of Therapy:  INR 2-3   Plan:  1.  Warfarin 5 mg today 2.  Daily PT/INR, F/U in AM 3.  Consider lower home dose on discharge with admission INR of over 7.  Reece Leader, Pharm D 07/31/2011 1:23 PM

## 2011-07-31 NOTE — Progress Notes (Signed)
Upon initial assessment noted that patient has a stage II pressure ulcer on sacrum.  Applied EPC cream and discussed that keeping the patient turned throughout the night would decrease the pressure on the wound.  Pt let RN and NT turn him three times during the night but at 4am pt requested to be left alone and let him sleep.

## 2011-08-01 DIAGNOSIS — J159 Unspecified bacterial pneumonia: Secondary | ICD-10-CM | POA: Diagnosis not present

## 2011-08-01 DIAGNOSIS — R5381 Other malaise: Secondary | ICD-10-CM | POA: Diagnosis not present

## 2011-08-01 LAB — BASIC METABOLIC PANEL
BUN: 14 mg/dL (ref 6–23)
Calcium: 9.2 mg/dL (ref 8.4–10.5)
Creatinine, Ser: 0.57 mg/dL (ref 0.50–1.35)
GFR calc Af Amer: >90 mL/min (ref >90)

## 2011-08-01 LAB — PROTIME-INR
INR: 1.49 (ref 0.00–1.49)
Prothrombin Time: 18.3 seconds — ABNORMAL HIGH (ref 11.6–15.2)

## 2011-08-01 MED ORDER — MOXIFLOXACIN HCL 400 MG PO TABS: 400.0000 mg | ORAL_TABLET | Freq: Every day | ORAL | Status: AC

## 2011-08-01 MED ORDER — WARFARIN SODIUM 6 MG PO TABS: 6.0000 mg | ORAL_TABLET | Freq: Once | ORAL | Status: DC

## 2011-08-01 MED ORDER — WARFARIN SODIUM 6 MG PO TABS
6.0000 mg | ORAL_TABLET | Freq: Once | ORAL | Status: DC
  Filled 2011-08-01: qty 1

## 2011-08-01 MED ORDER — IPRATROPIUM-ALBUTEROL 18-103 MCG/ACT IN AERO: 2.0000 | INHALATION_SPRAY | Freq: Four times a day (QID) | RESPIRATORY_TRACT | Status: DC | PRN

## 2011-08-01 NOTE — Discharge Summary (Signed)
Physician Discharge Summary  Patient ID: [name] MRN: [id number] DOB/AGE: [birth year] 78 y.o.  Admit date: 07/29/2011 Discharge date: 08/01/2011  Primary Care Physician:  Edward Found, MD, MD   Discharge Diagnoses:    Principal Problem:  *CAP (community acquired pneumonia) Active Problems:  CAD  Atrial fibrillation  PLEURAL EFFUSION  Hyperthyroidism  Spinal stenosis  Chronic pain Dehydration   Medication List  As of 08/01/2011 10:53 AM   STOP taking these medications         azithromycin 250 MG tablet      meclizine 25 MG tablet      MYRBETRIQ PO      spironolactone 25 MG tablet         TAKE these medications         albuterol-ipratropium 18-103 MCG/ACT inhaler   Commonly known as: COMBIVENT   Inhale 2 puffs into the lungs every 6 (six) hours as needed for wheezing or shortness of breath.      CALCIUM 600/VITAMIN D PO   Take 1 tablet by mouth 2 (two) times daily.      cyclobenzaprine 10 MG tablet   Commonly known as: FLEXERIL   Take 10 mg by mouth at bedtime.      donepezil 5 MG tablet   Commonly known as: ARICEPT   Take 5 mg by mouth at bedtime.      fentaNYL 25 MCG/HR   Commonly known as: DURAGESIC - dosed mcg/hr   Place 1 patch onto the skin every 3 (three) days.      HYDROcodone-acetaminophen 10-500 MG per tablet   Commonly known as: LORTAB   Take 1 tablet by mouth every 6 (six) hours as needed. For pain.      Hydrocortisone Ace-Pramoxine 2.5-1 & 1 % Kit   Place 1 application rectally 4 (four) times daily as needed. For hemorrhoids.      hyoscyamine 0.125 MG tablet   Commonly known as: LEVSIN, ANASPAZ   Take 0.125 mg by mouth daily as needed. For stomach cramping      levothyroxine 88 MCG tablet   Commonly known as: SYNTHROID, LEVOTHROID   Take 88 mcg by mouth every morning.      LORazepam 0.5 MG tablet   Commonly known as: ATIVAN   Take 0.5-1 mg by mouth every 8 (eight) hours as needed. For anxiety      memantine 10 MG  tablet   Commonly known as: NAMENDA   Take 10 mg by mouth 2 (two) times daily.      moxifloxacin 400 MG tablet   Commonly known as: AVELOX   Take 1 tablet (400 mg total) by mouth daily.      omega-3 acid ethyl esters 1 G capsule   Commonly known as: LOVAZA   Take 1 g by mouth at bedtime.      silver sulfADIAZINE 1 % cream   Commonly known as: SILVADENE   Apply 1 application topically daily. Until wound is clear      SYSTANE 0.4-0.3 % Soln   Generic drug: Polyethyl Glycol-Propyl Glycol   Place 1 drop into both eyes 3 (three) times daily as needed. For dry eyes      temazepam 15 MG capsule   Commonly known as: RESTORIL   Take 15 mg by mouth at bedtime.      tobramycin-dexamethasone ophthalmic ointment   Commonly known as: TOBRADEX   Place 1 application into both eyes every 6 (six) hours as needed. For eye infection  triamcinolone cream 0.1 %   Commonly known as: KENALOG   Apply 1 application topically 2 (two) times daily as needed. For rash      vitamin C 500 MG tablet   Commonly known as: ASCORBIC ACID   Take 500 mg by mouth at bedtime.      warfarin 5 MG tablet   Commonly known as: COUMADIN   Take 5 mg by mouth daily. On Tuesday, Thursday, Saturday, and Sunday, take one tablet along with a 1MG  tablet.      warfarin 1 MG tablet   Commonly known as: COUMADIN   Take 1 mg by mouth See admin instructions. Take 1 tablet by mouth along with a 5MG  tablet on Tuesday, Thursday, Saturday, and Sunday.           Disposition and Follow-up:  [name] is being discharged in stable condition to his home, home health care. A PT evaluation has determined that he is close to his baseline he can return home with close home health supervision. He will need to followup with his primary care provider in one to 2 weeks. He needs an INR check in one to 2 days. Either home health to draw a PT/INR or he can see his cardiologist Dr. Verdis Prime. As held his spironolactone diuretic as this may  lead to dehydration and to his weakness. Also stopped his anticholinergic for urinary incontinence at night because this medication can cause confusion. Additionally he is being treated for a pneumonia with 7 days of oral antibiotics.  Consults: none  Significant Diagnostic Studies:  Dg Chest 2 View  07/29/2011  *RADIOLOGY REPORT*  Clinical Data: Very weak.  Weight loss.  CHEST - 2 VIEW  Comparison: 01/17/2011.  Findings: Consolidation right base.  This may represent an infectious infiltrate.  Given the patient's history of weight loss, this will need to be followed until clearance to exclude underlying mass.  Pacemaker enters from the left with the lead in the region of the right ventricle.  Mild cardiomegaly.  Mild central pulmonary vascular prominence.  No pneumothorax.  Calcified mildly tortuous aorta.  IMPRESSION: Consolidation right base.  This may represent an infiltrate but needs to be followed until clearance to exclude underlying mass in this patient with history of weight loss.  Please see above.  Original Report Authenticated By: Fuller Canada, M.D.   Ct Head Wo Contrast  07/29/2011  *RADIOLOGY REPORT*  Clinical Data: New onset of left-sided weakness, slurred speech.  CT HEAD WITHOUT CONTRAST  Technique:  Contiguous axial images were obtained from the base of the skull through the vertex without contrast.  Comparison: Head CT 01/17/2011.  Findings: No acute intracranial abnormality.  Specifically, no gross evidence to suggest acute infarction.  No mass, mass effect, hydrocephalus or abnormal intra or extra-axial fluid collections. Moderate cerebral and mild cerebellar atrophy with associated ex vacuo dilatation of the ventricular system, similar to priors.  Old lacunar infarct in the left putamen (unchanged).  Mild decreased attenuation of the periventricular white matter of the cerebral hemispheres bilaterally, likely reflects chronic microvascular ischemic changes (similar to prior).  Visualized paranasal sinuses and mastoids are well pneumatized.  IMPRESSION:  1.  No acute intracranial abnormality.  Specifically, no gross imaging findings to suggest acute infarction. 2.  Cerebral atrophy and chronic microvascular ischemic changes in the cerebral white matter, similar to prior examination from 01/17/2011.  These results were called by telephone on 07/29/2011  at  9:35 am to  Dr. Audelia Acton , who  verbally acknowledged these results.  Original Report Authenticated By: Florencia Reasons, M.D.    Brief H and P: Patient is an 82 showed gentleman has a past medical history significant for mild dementia, atrial fibrillation, urinary incontinence, lower GI bleeding and CHF who presented to the hospital after falling out of the bed feeling like he had weakness on his left side. His family reported that for the past 2-3 days he had been getting progressively weaker at home with mild mental status changes, confusion and poor appetite. CXR in ED showed probably PNA.  Hospital Course:  Principal Problem:  *CAP (community acquired pneumonia) Active Problems:  CAD  Atrial fibrillation  PLEURAL EFFUSION  Hyperthyroidism  Spinal stenosis  Chronic pain  Patient was admitted for workup of his weakness and altered mental status. His TIA workup was negative, he had a negative CT scan of the head, and his symptoms resolved rapidly upon IV fluid hydration. A CT of the chest was ordered which showed bilateral basilar infiltrates consistent with pneumonia. The patient has not had clinical signs of pneumonia he has a low grade temp and also had a mild cough his white count is not elevated he has not been febrile on this admission. Significant shortness of breath is reported. Given his lack of obvious clinical signs of pneumonia I obtained a CT scan to make sure that this was not a malignancy or another pulmonary process. A CT scan showed COPD, emphysema, atelectasis as well as probable air bronchograms  consistent with pneumonia incidentally Gill were multiple pleural plaques consistent with asbestosis. Of note the patient was a [employer] fund[employer] and had a contact with inhaled multiple organic solvents throughout his life. He however denies any chronic lung disease or exacerbations where he has been unable to breathe or had severe shortness of breath and wheezing requiring hospitalization.  Plan for his discharge includes a 7 day course of Avelox for empiric treatment of pneumonia. I stopped his spironolactone and his nighttime anticholinergic for his urinary incontinence both could have led to dehydration and confusion and weakness upon waking in the morning likely from orthostasis. I've also advised the patient about issues related to polypharmacy as he is on multiple opiate pain medications for spinal stenosis as well as temazepam, lorazepam and Flexeril for muscle spasm and sleep.  Time spent on Discharge: 50 minutes  Signed: Jionni Helming [facility]  08/01/2011, 10:53 AM

## 2011-08-01 NOTE — Progress Notes (Signed)
Subjective: No complaints, still weak.  Objective: Vital signs in last 24 hours: Temp:  [97.6 F (36.4 C)-97.8 F (36.6 C)] 97.6 F (36.4 C) (02/17 0534) Pulse Rate:  [76-80] 76  (02/17 0534) Resp:  [16-18] 16  (02/17 0534) BP: (108-126)/(64-69) 126/67 mmHg (02/17 0534) SpO2:  [98 %] 98 % (02/17 0534) Weight change:  Last BM Date: 07/30/11  Intake/Output from previous day: 02/16 0701 - 02/17 0700 In: 480 [P.O.:480] Out: 1100 [Urine:1100]    Physical Exam: General: Alert, awake, oriented x3, in no acute distress. HEENT: No bruits, no goiter. Heart: Regular rate and rhythm, without murmurs, rubs, gallops. Lungs: Clear to auscultation bilaterally. Abdomen: Soft, nontender, nondistended, positive bowel sounds. Extremities: No clubbing cyanosis or edema with positive pedal pulses. Neuro: Grossly intact, nonfocal.    Lab Results: Basic Metabolic Panel:  Basename 08/01/11 0537 07/30/11 0530  NA 131* 133*  K 3.8 3.7  CL 99 101  CO2 24 24  GLUCOSE 97 84  BUN 14 14  CREATININE 0.57 0.48*  CALCIUM 9.2 8.5  MG -- --  PHOS -- --   Liver Function Tests: No results found for this basename: AST:2,ALT:2,ALKPHOS:2,BILITOT:2,PROT:2,ALBUMIN:2 in the last 72 hours No results found for this basename: LIPASE:2,AMYLASE:2 in the last 72 hours No results found for this basename: AMMONIA:2 in the last 72 hours CBC:  Basename 07/30/11 0530  WBC 5.4  NEUTROABS --  HGB 10.2*  HCT 30.1*  MCV 92.0  PLT 199   Thyroid Function Tests:  Basename 07/29/11 1654  TSH 3.468  T4TOTAL --  FREET4 --  T3FREE --  THYROIDAB --   Coagulation:  Basename 08/01/11 0537 07/31/11 0608  LABPROT 18.3* 18.0*  INR 1.49 1.46   Urinalysis:  Basename 07/29/11 1318  COLORURINE YELLOW  LABSPEC 1.016  PHURINE 6.0  GLUCOSEU NEGATIVE  HGBUR NEGATIVE  BILIRUBINUR NEGATIVE  KETONESUR NEGATIVE  PROTEINUR NEGATIVE  UROBILINOGEN 0.2  NITRITE NEGATIVE  LEUKOCYTESUR NEGATIVE    Recent Results  (from the past 240 hour(s))  CULTURE, BLOOD (ROUTINE X 2)     Status: Normal (Preliminary result)   Collection Time   07/29/11 11:30 AM      Component Value Range Status Comment   Specimen Description BLOOD RIGHT HAND   Final    Special Requests BOTTLES DRAWN AEROBIC ONLY [facility]   Final    Culture  Setup Time [id number]   Final    Culture     Final    Value:        BLOOD CULTURE RECEIVED NO GROWTH TO DATE CULTURE WILL BE HELD FOR 5 DAYS BEFORE ISSUING A FINAL NEGATIVE REPORT   Report Status PENDING   Incomplete   CULTURE, BLOOD (ROUTINE X 2)     Status: Normal   Collection Time   07/29/11 11:40 AM      Component Value Range Status Comment   Specimen Description BLOOD LEFT ARM   Final    Special Requests BOTTLES DRAWN AEROBIC AND ANAEROBIC 10CC   Final    Culture  Setup Time [id number]   Final    Culture     Final    Value: STAPHYLOCOCCUS SPECIES (COAGULASE NEGATIVE)     Note: THE SIGNIFICANCE OF ISOLATING THIS ORGANISM FROM A SINGLE SET OF BLOOD CULTURES WHEN MULTIPLE SETS ARE DRAWN IS UNCERTAIN. PLEASE NOTIFY THE MICROBIOLOGY DEPARTMENT WITHIN ONE WEEK IF SPECIATION AND SENSITIVITIES ARE REQUIRED.     Note: Gram Stain Report Called to,Read Back By and Verified With: LAUREN @ [phone]  ON 07/30/11 BY GOLLD   Report Status 07/31/2011 FINAL   Final   CULTURE, BLOOD (SINGLE)     Status: Normal (Preliminary result)   Collection Time   07/31/11 11:20 AM      Component Value Range Status Comment   Specimen Description BLOOD LEFT ARM   Final    Special Requests BOTTLES DRAWN AEROBIC AND ANAEROBIC 10CC   Final    Culture  Setup Time [id number]   Final    Culture     Final    Value:        BLOOD CULTURE RECEIVED NO GROWTH TO DATE CULTURE WILL BE HELD FOR 5 DAYS BEFORE ISSUING A FINAL NEGATIVE REPORT   Report Status PENDING   Incomplete     Studies/Results: Ct Chest W Contrast  07/30/2011  *RADIOLOGY REPORT*  Clinical Data: Consolidation. Mass versus pneumonia. consolidation/effusion. Pneumonia  unlikely clinically.  CT CHEST WITH CONTRAST  Technique:  Multidetector CT imaging of the chest was performed following the standard protocol during bolus administration of intravenous contrast.  Contrast: 80mL OMNIPAQUE IOHEXOL 300 MG/ML IV SOLN  Comparison: Radiograph 07/29/2011.  Findings: Cardiomegaly.  Left subclavian cardiac pacemaker lead with the tip terminating in the right ventricular apex.  There is no pericardial effusion.  Aortic annular and valvular calcification. Coronary artery atherosclerosis is present. If office based assessment of coronary risk factors has not been performed, it is now recommended.  Enlargement of the pulmonary artery compatible with pulmonary arterial hypertension.  Tiny bilateral pleural effusions are present.  Partially calcified pleural apical scarring is present, bilateral pleural apical plaques are present.  Calcification is greater on the right than left.  Aortic atherosclerosis without acute abnormality.  Incidental imaging the upper abdomen demonstrates a small amount of ascites, suggesting the presence of volume overload.  Splenic granuloma is present.  No pericardial effusion is present.  Patulous thoracic esophagus.  The lower lobes demonstrate collapse / consolidation bilaterally. There is elevation of the right hemidiaphragm and colonic interposition between the posterior right hepatic dome and the hemidiaphragm accounting for mixed soft tissue and gas appearance on prior radiograph.  Air bronchograms are present in the right lower lobe that demonstrates collapse / consolidation.  No discrete mass is identified although it cannot be excluded based on collapse / consolidation.  The lungs demonstrate emphysema.  Bilateral lower lobe cylindrical bronchiectasis is present.  Lingular scarring.  Chronic multilevel thoracic compression fractures are present with a dextroconvex thoracic scoliosis. Per CMS PQRS reporting requirements (PQRS Measure 24): Given the patient's  age of greater than 50 and the fracture site (hip, distal radius, or spine), the patient should be tested for osteoporosis using DXA, and the appropriate treatment considered based on the DXA results.  Compared to prior chest CT 04/14/2010, pleural effusions have improved.  Colonic diaphragmatic interposition appears little changed.  IMPRESSION: 1.  Bilateral right greater than left lower lobe collapse / consolidation with tiny bilateral pleural effusions. 2.  Cardiomegaly and pulmonary arterial hypertension.  Small effusions and this small amount of ascites which is partially visualized in the upper abdomen suggest volume overload. 3.  Atherosclerosis and coronary artery disease. 4.  Heterogeneous opacity on prior chest radiograph represents a combination of colonic diaphragmatic interposition and right lower lobe collapse / consolidation. 5.  Bilateral apical pleural plaques.  This suggests asbestos related pleural disease. 6.  Emphysema.  Lower lobe cylindrical bronchiectasis.  Original Report Authenticated By: Andreas Newport, M.D.    Medications: Scheduled Meds:   . cyclobenzaprine  10 mg Oral QHS  . donepezil  5 mg Oral QHS  . fentaNYL  25 mcg Transdermal Q72H  . levothyroxine  88 mcg Oral Q breakfast  . memantine  10 mg Oral BID  . moxifloxacin  400 mg Intravenous Q24H  . polyethylene glycol  17 g Oral Daily  . temazepam  15 mg Oral QHS  . warfarin  5 mg Oral ONCE-1800   Continuous Infusions:  PRN Meds:.acetaminophen, acetaminophen, albuterol, alum & mag hydroxide-simeth, HYDROcodone-acetaminophen, hyoscyamine, LORazepam, ondansetron (ZOFRAN) IV, ondansetron, polyvinyl alcohol, senna-docusate, tobramycin-dexamethasone  Assessment/Plan:  Principal Problem:  *CAP (community acquired pneumonia) Active Problems:  CAD  Atrial fibrillation  PLEURAL EFFUSION  Hyperthyroidism  Spinal stenosis  Chronic pain ** note this patient does not have CT contrast allergy.  1. PNA, CT negative for  mas or obvious malignancy, positive for COPD, and pleural diseasese, continue IV rocephin and azitho for now.  2. Chronic Pain-spinal stenopsis, resumed home medications/fentanyl patch and prn hydrocodone  3. Weakness/ AMS: was initially w/u as possible TIA stoke, CT of the head was negative and symptoms resolved. He is already on Coumadin therapy. A-fib rate controlled stable.  4. Dementia, mild, well compensated, he tells me that he has in home care providers who if needed can provide care 24/7. Plan is to d/c home with [facility] services and PT.  5. Supratherapuetic INR: on admission was 7.3 now 1.6 , will resume regular coumadin dosing and he will need inr check on Monday.  6. COPD on CT, relatively asymptomatic, will start him on Albuterol prn.    LOS: 3 days   Abdulrahim Siddiqi [facility] 08/01/2011, 10:39 AM

## 2011-08-01 NOTE — Progress Notes (Signed)
ANTICOAGULATION CONSULT NOTE - Follow Up Consult  Pharmacy Consult for Coumadin Indication: atrial fibrillation  Allergies  Allergen Reactions  . Penicillins Hives  . Uroxatral (Alfuzosin Hydrochloride) Other (See Comments)    hypotension    Vital Signs: Temp: 97.6 F (36.4 C) (02/17 0534) BP: 126/67 mmHg (02/17 0534) Pulse Rate: 76  (02/17 0534)  Labs:  Basename 08/01/11 0537 07/31/11 0608 07/30/11 0530  HGB -- -- 10.2*  HCT -- -- 30.1*  PLT -- -- 199  APTT -- -- --  LABPROT 18.3* 18.0* 20.1*  INR 1.49 1.46 1.68*  HEPARINUNFRC -- -- --  CREATININE 0.57 -- 0.48*  CKTOTAL -- -- --  CKMB -- -- --  TROPONINI -- -- --    Medical History: Past Medical History  Diagnosis Date  . CHF (congestive heart failure)   . Arthritis   . Pacemaker   . Incontinence   . Pneumonia 2011; 07/29/11  . Neuropathy   . Heart murmur   . Dysrhythmia     paced  . Hypothyroidism   . Restless leg syndrome   . Blood transfusion   . Anemia   . Lower GI bleeding 2011    "from ATB I wasn't suppose to have had ordered"  . Dementia 07/29/11     not observed by this RN    Medications:  Prescriptions prior to admission  Medication Sig Dispense Refill  . Calcium Carbonate-Vitamin D (CALCIUM 600/VITAMIN D PO) Take 1 tablet by mouth 2 (two) times daily.       . cyclobenzaprine (FLEXERIL) 10 MG tablet Take 10 mg by mouth at bedtime.      . donepezil (ARICEPT) 5 MG tablet Take 5 mg by mouth at bedtime.       . fentaNYL (DURAGESIC - DOSED MCG/HR) 25 MCG/HR Place 1 patch onto the skin every 3 (three) days.      Marland Kitchen HYDROcodone-acetaminophen (LORTAB) 10-500 MG per tablet Take 1 tablet by mouth every 6 (six) hours as needed. For pain.      Marland Kitchen Hydrocortisone Ace-Pramoxine 2.5-1 & 1 % KIT Place 1 application rectally 4 (four) times daily as needed. For hemorrhoids.      . hyoscyamine (LEVSIN, ANASPAZ) 0.125 MG tablet Take 0.125 mg by mouth daily as needed. For stomach cramping      . levothyroxine  (SYNTHROID, LEVOTHROID) 88 MCG tablet Take 88 mcg by mouth every morning.      Marland Kitchen LORazepam (ATIVAN) 0.5 MG tablet Take 0.5-1 mg by mouth every 8 (eight) hours as needed. For anxiety      . memantine (NAMENDA) 10 MG tablet Take 10 mg by mouth 2 (two) times daily.      Marland Kitchen omega-3 acid ethyl esters (LOVAZA) 1 G capsule Take 1 g by mouth at bedtime.       Bertram Gala Glycol-Propyl Glycol (SYSTANE) 0.4-0.3 % SOLN Place 1 drop into both eyes 3 (three) times daily as needed. For dry eyes      . silver sulfADIAZINE (SILVADENE) 1 % cream Apply 1 application topically daily. Until wound is clear      . temazepam (RESTORIL) 15 MG capsule Take 15 mg by mouth at bedtime.      Marland Kitchen tobramycin-dexamethasone (TOBRADEX) ophthalmic ointment Place 1 application into both eyes every 6 (six) hours as needed. For eye infection      . triamcinolone cream (KENALOG) 0.1 % Apply 1 application topically 2 (two) times daily as needed. For rash      . vitamin  C (ASCORBIC ACID) 500 MG tablet Take 500 mg by mouth at bedtime.       Marland Kitchen warfarin (COUMADIN) 1 MG tablet Take 1 mg by mouth See admin instructions. Take 1 tablet by mouth along with a 5MG  tablet on Tuesday, Thursday, Saturday, and Sunday.      . warfarin (COUMADIN) 5 MG tablet Take 5 mg by mouth daily. On Tuesday, Thursday, Saturday, and Sunday, take one tablet along with a 1MG  tablet.      Marland Kitchen DISCONTD: azithromycin (ZITHROMAX) 250 MG tablet Take 250-500 mg by mouth See admin instructions. Take 2 tablets by mouth the first day, then 1 tablet daily for a total of 5 days.  Started on 07/28/11      . DISCONTD: meclizine (ANTIVERT) 25 MG tablet Take 25 mg by mouth 3 (three) times daily as needed. For dizziness      . DISCONTD: Mirabegron (MYRBETRIQ PO) Take 25 mg by mouth at bedtime.       Marland Kitchen DISCONTD: spironolactone (ALDACTONE) 25 MG tablet Take 12.5 mg by mouth daily.         Admit Compaint: 78 yo M admitted 07/29/11 with AMS.  Pharmacy consulted to continue his chronic Coumadin  therapy for AFib.  INR was supratherapeutic on admission and Vit K 5 mg IV was given.  He states he takes 5 mg daily except 6 mg on MWF.  Today's INR subtherapeutic, will likely take a few days to overcome effects of Vit. K.  Goal of Therapy:  INR 2-3   Plan:  1.  Warfarin 6 mg today 2.  Daily PT/INR, F/U in AM 3.  Noted plans for D/C 2/18.  May need smaller Coumadin dose as outpatient, but agree with MD plan to check INR in 1-2 days.  Reece Leader, Pharm D 08/01/2011 1:21 PM

## 2011-08-01 NOTE — Progress Notes (Signed)
   CARE MANAGEMENT NOTE 08/01/2011  Patient:  [name], [name]   Account Number:  [id number]  Date Initiated:  07/30/2011  Documentation initiated by:  Letha Cape  Subjective/Objective Assessment:   dx CAP  admit- lives with spouse     Action/Plan:   pt eval   Anticipated DC Date:  08/02/2011   Anticipated DC Plan:  HOME W HOME HEALTH SERVICES      DC Planning Services  CM consult      [facility] Choice  HOME HEALTH   Choice offered to / List presented to:  C-1 Patient        HH arranged  HH-1 RN  HH-2 PT      [facility] agency  [facility]   Status of service:  Completed, signed off Medicare Important Message given?   (If response is "NO", the following Medicare IM given date fields will be blank) Date Medicare IM given:   Date Additional Medicare IM given:    Discharge Disposition:  HOME W HOME HEALTH SERVICES  Per UR Regulation:    Comments:  08/01/2011 1200 Wife states the doctor's office scheduled pt with [place]. Called [facility] and they will follow up with pt. Will fax orders, facesheet, F2F and d/c summary to [place]. Isidoro Donning RN CCM Case Mgmt phone [phone]  07/30/11 12:45 Letha Cape RN, BSN [phone] patient lives with spouse, await pt eval.  Patient states he is active with [facility]  and would like to cont with [facility], he has medicare part D for his medications, and his family will be his transportation at dc.  Patient states he also has a pcs person that has been with him for years now.  I called Mary with [facility] to make sure pt is active with [facility] , she will call me back with info.  Per [facility] with [facility], patient is not active with them, informed her that if needed patient would like to contine to work with [facility].

## 2011-08-02 DIAGNOSIS — Z950 Presence of cardiac pacemaker: Secondary | ICD-10-CM | POA: Diagnosis not present

## 2011-08-03 DIAGNOSIS — M1710 Unilateral primary osteoarthritis, unspecified knee: Secondary | ICD-10-CM | POA: Diagnosis not present

## 2011-08-03 DIAGNOSIS — IMO0001 Reserved for inherently not codable concepts without codable children: Secondary | ICD-10-CM | POA: Diagnosis not present

## 2011-08-03 DIAGNOSIS — I4891 Unspecified atrial fibrillation: Secondary | ICD-10-CM | POA: Diagnosis not present

## 2011-08-03 DIAGNOSIS — I5030 Unspecified diastolic (congestive) heart failure: Secondary | ICD-10-CM | POA: Diagnosis not present

## 2011-08-03 DIAGNOSIS — H543 Unqualified visual loss, both eyes: Secondary | ICD-10-CM | POA: Diagnosis not present

## 2011-08-04 DIAGNOSIS — I5030 Unspecified diastolic (congestive) heart failure: Secondary | ICD-10-CM | POA: Diagnosis not present

## 2011-08-04 DIAGNOSIS — H543 Unqualified visual loss, both eyes: Secondary | ICD-10-CM | POA: Diagnosis not present

## 2011-08-04 DIAGNOSIS — I4891 Unspecified atrial fibrillation: Secondary | ICD-10-CM | POA: Diagnosis not present

## 2011-08-04 DIAGNOSIS — M1710 Unilateral primary osteoarthritis, unspecified knee: Secondary | ICD-10-CM | POA: Diagnosis not present

## 2011-08-04 DIAGNOSIS — IMO0001 Reserved for inherently not codable concepts without codable children: Secondary | ICD-10-CM | POA: Diagnosis not present

## 2011-08-04 LAB — CULTURE, BLOOD (ROUTINE X 2): Culture  Setup Time: 201302141816

## 2011-08-05 DIAGNOSIS — I495 Sick sinus syndrome: Secondary | ICD-10-CM | POA: Diagnosis not present

## 2011-08-06 DIAGNOSIS — IMO0001 Reserved for inherently not codable concepts without codable children: Secondary | ICD-10-CM | POA: Diagnosis not present

## 2011-08-06 DIAGNOSIS — I5030 Unspecified diastolic (congestive) heart failure: Secondary | ICD-10-CM | POA: Diagnosis not present

## 2011-08-06 DIAGNOSIS — H543 Unqualified visual loss, both eyes: Secondary | ICD-10-CM | POA: Diagnosis not present

## 2011-08-06 DIAGNOSIS — I4891 Unspecified atrial fibrillation: Secondary | ICD-10-CM | POA: Diagnosis not present

## 2011-08-06 DIAGNOSIS — M1710 Unilateral primary osteoarthritis, unspecified knee: Secondary | ICD-10-CM | POA: Diagnosis not present

## 2011-08-06 LAB — CULTURE, BLOOD (SINGLE)
Culture  Setup Time: 201302162002
Culture: NO GROWTH

## 2011-08-09 DIAGNOSIS — I4891 Unspecified atrial fibrillation: Secondary | ICD-10-CM | POA: Diagnosis not present

## 2011-08-09 DIAGNOSIS — M1710 Unilateral primary osteoarthritis, unspecified knee: Secondary | ICD-10-CM | POA: Diagnosis not present

## 2011-08-09 DIAGNOSIS — I5030 Unspecified diastolic (congestive) heart failure: Secondary | ICD-10-CM | POA: Diagnosis not present

## 2011-08-09 DIAGNOSIS — H543 Unqualified visual loss, both eyes: Secondary | ICD-10-CM | POA: Diagnosis not present

## 2011-08-09 DIAGNOSIS — IMO0001 Reserved for inherently not codable concepts without codable children: Secondary | ICD-10-CM | POA: Diagnosis not present

## 2011-08-11 DIAGNOSIS — I4891 Unspecified atrial fibrillation: Secondary | ICD-10-CM | POA: Diagnosis not present

## 2011-08-11 DIAGNOSIS — H543 Unqualified visual loss, both eyes: Secondary | ICD-10-CM | POA: Diagnosis not present

## 2011-08-11 DIAGNOSIS — IMO0001 Reserved for inherently not codable concepts without codable children: Secondary | ICD-10-CM | POA: Diagnosis not present

## 2011-08-11 DIAGNOSIS — M1710 Unilateral primary osteoarthritis, unspecified knee: Secondary | ICD-10-CM | POA: Diagnosis not present

## 2011-08-11 DIAGNOSIS — I5030 Unspecified diastolic (congestive) heart failure: Secondary | ICD-10-CM | POA: Diagnosis not present

## 2011-08-16 DIAGNOSIS — H543 Unqualified visual loss, both eyes: Secondary | ICD-10-CM | POA: Diagnosis not present

## 2011-08-16 DIAGNOSIS — IMO0001 Reserved for inherently not codable concepts without codable children: Secondary | ICD-10-CM | POA: Diagnosis not present

## 2011-08-16 DIAGNOSIS — I5030 Unspecified diastolic (congestive) heart failure: Secondary | ICD-10-CM | POA: Diagnosis not present

## 2011-08-16 DIAGNOSIS — I4891 Unspecified atrial fibrillation: Secondary | ICD-10-CM | POA: Diagnosis not present

## 2011-08-16 DIAGNOSIS — M1710 Unilateral primary osteoarthritis, unspecified knee: Secondary | ICD-10-CM | POA: Diagnosis not present

## 2011-08-18 DIAGNOSIS — Z23 Encounter for immunization: Secondary | ICD-10-CM | POA: Diagnosis not present

## 2011-08-18 DIAGNOSIS — I10 Essential (primary) hypertension: Secondary | ICD-10-CM | POA: Diagnosis not present

## 2011-08-18 DIAGNOSIS — Z7901 Long term (current) use of anticoagulants: Secondary | ICD-10-CM | POA: Diagnosis not present

## 2011-08-18 DIAGNOSIS — M2550 Pain in unspecified joint: Secondary | ICD-10-CM | POA: Diagnosis not present

## 2011-08-18 DIAGNOSIS — R300 Dysuria: Secondary | ICD-10-CM | POA: Diagnosis not present

## 2011-08-18 DIAGNOSIS — L8990 Pressure ulcer of unspecified site, unspecified stage: Secondary | ICD-10-CM | POA: Diagnosis not present

## 2011-08-18 DIAGNOSIS — M1710 Unilateral primary osteoarthritis, unspecified knee: Secondary | ICD-10-CM | POA: Diagnosis not present

## 2011-08-18 DIAGNOSIS — I509 Heart failure, unspecified: Secondary | ICD-10-CM | POA: Diagnosis not present

## 2011-08-18 DIAGNOSIS — J189 Pneumonia, unspecified organism: Secondary | ICD-10-CM | POA: Diagnosis not present

## 2011-08-19 DIAGNOSIS — IMO0001 Reserved for inherently not codable concepts without codable children: Secondary | ICD-10-CM | POA: Diagnosis not present

## 2011-08-19 DIAGNOSIS — I4891 Unspecified atrial fibrillation: Secondary | ICD-10-CM | POA: Diagnosis not present

## 2011-08-19 DIAGNOSIS — M1710 Unilateral primary osteoarthritis, unspecified knee: Secondary | ICD-10-CM | POA: Diagnosis not present

## 2011-08-19 DIAGNOSIS — I5030 Unspecified diastolic (congestive) heart failure: Secondary | ICD-10-CM | POA: Diagnosis not present

## 2011-08-19 DIAGNOSIS — H543 Unqualified visual loss, both eyes: Secondary | ICD-10-CM | POA: Diagnosis not present

## 2011-08-24 DIAGNOSIS — H543 Unqualified visual loss, both eyes: Secondary | ICD-10-CM | POA: Diagnosis not present

## 2011-08-24 DIAGNOSIS — IMO0001 Reserved for inherently not codable concepts without codable children: Secondary | ICD-10-CM | POA: Diagnosis not present

## 2011-08-24 DIAGNOSIS — I4891 Unspecified atrial fibrillation: Secondary | ICD-10-CM | POA: Diagnosis not present

## 2011-08-24 DIAGNOSIS — I5030 Unspecified diastolic (congestive) heart failure: Secondary | ICD-10-CM | POA: Diagnosis not present

## 2011-08-24 DIAGNOSIS — M1710 Unilateral primary osteoarthritis, unspecified knee: Secondary | ICD-10-CM | POA: Diagnosis not present

## 2011-08-27 DIAGNOSIS — H543 Unqualified visual loss, both eyes: Secondary | ICD-10-CM | POA: Diagnosis not present

## 2011-08-27 DIAGNOSIS — IMO0001 Reserved for inherently not codable concepts without codable children: Secondary | ICD-10-CM | POA: Diagnosis not present

## 2011-08-27 DIAGNOSIS — I4891 Unspecified atrial fibrillation: Secondary | ICD-10-CM | POA: Diagnosis not present

## 2011-08-27 DIAGNOSIS — M1710 Unilateral primary osteoarthritis, unspecified knee: Secondary | ICD-10-CM | POA: Diagnosis not present

## 2011-08-27 DIAGNOSIS — I5030 Unspecified diastolic (congestive) heart failure: Secondary | ICD-10-CM | POA: Diagnosis not present

## 2011-08-29 DIAGNOSIS — H543 Unqualified visual loss, both eyes: Secondary | ICD-10-CM | POA: Diagnosis not present

## 2011-08-29 DIAGNOSIS — I5030 Unspecified diastolic (congestive) heart failure: Secondary | ICD-10-CM | POA: Diagnosis not present

## 2011-08-29 DIAGNOSIS — M1710 Unilateral primary osteoarthritis, unspecified knee: Secondary | ICD-10-CM | POA: Diagnosis not present

## 2011-08-29 DIAGNOSIS — I4891 Unspecified atrial fibrillation: Secondary | ICD-10-CM | POA: Diagnosis not present

## 2011-08-29 DIAGNOSIS — IMO0001 Reserved for inherently not codable concepts without codable children: Secondary | ICD-10-CM | POA: Diagnosis not present

## 2011-08-30 DIAGNOSIS — IMO0001 Reserved for inherently not codable concepts without codable children: Secondary | ICD-10-CM | POA: Diagnosis not present

## 2011-08-30 DIAGNOSIS — H543 Unqualified visual loss, both eyes: Secondary | ICD-10-CM | POA: Diagnosis not present

## 2011-08-30 DIAGNOSIS — M1710 Unilateral primary osteoarthritis, unspecified knee: Secondary | ICD-10-CM | POA: Diagnosis not present

## 2011-08-30 DIAGNOSIS — I4891 Unspecified atrial fibrillation: Secondary | ICD-10-CM | POA: Diagnosis not present

## 2011-08-30 DIAGNOSIS — I5030 Unspecified diastolic (congestive) heart failure: Secondary | ICD-10-CM | POA: Diagnosis not present

## 2011-08-31 DIAGNOSIS — Z85828 Personal history of other malignant neoplasm of skin: Secondary | ICD-10-CM | POA: Diagnosis not present

## 2011-08-31 DIAGNOSIS — L821 Other seborrheic keratosis: Secondary | ICD-10-CM | POA: Diagnosis not present

## 2011-09-01 DIAGNOSIS — H543 Unqualified visual loss, both eyes: Secondary | ICD-10-CM | POA: Diagnosis not present

## 2011-09-01 DIAGNOSIS — I5030 Unspecified diastolic (congestive) heart failure: Secondary | ICD-10-CM | POA: Diagnosis not present

## 2011-09-01 DIAGNOSIS — I4891 Unspecified atrial fibrillation: Secondary | ICD-10-CM | POA: Diagnosis not present

## 2011-09-01 DIAGNOSIS — M1710 Unilateral primary osteoarthritis, unspecified knee: Secondary | ICD-10-CM | POA: Diagnosis not present

## 2011-09-01 DIAGNOSIS — IMO0001 Reserved for inherently not codable concepts without codable children: Secondary | ICD-10-CM | POA: Diagnosis not present

## 2011-09-06 DIAGNOSIS — H543 Unqualified visual loss, both eyes: Secondary | ICD-10-CM | POA: Diagnosis not present

## 2011-09-06 DIAGNOSIS — M1710 Unilateral primary osteoarthritis, unspecified knee: Secondary | ICD-10-CM | POA: Diagnosis not present

## 2011-09-06 DIAGNOSIS — I5030 Unspecified diastolic (congestive) heart failure: Secondary | ICD-10-CM | POA: Diagnosis not present

## 2011-09-06 DIAGNOSIS — IMO0001 Reserved for inherently not codable concepts without codable children: Secondary | ICD-10-CM | POA: Diagnosis not present

## 2011-09-06 DIAGNOSIS — I4891 Unspecified atrial fibrillation: Secondary | ICD-10-CM | POA: Diagnosis not present

## 2011-09-07 DIAGNOSIS — M1710 Unilateral primary osteoarthritis, unspecified knee: Secondary | ICD-10-CM | POA: Diagnosis not present

## 2011-09-07 DIAGNOSIS — I5030 Unspecified diastolic (congestive) heart failure: Secondary | ICD-10-CM | POA: Diagnosis not present

## 2011-09-07 DIAGNOSIS — H543 Unqualified visual loss, both eyes: Secondary | ICD-10-CM | POA: Diagnosis not present

## 2011-09-07 DIAGNOSIS — I4891 Unspecified atrial fibrillation: Secondary | ICD-10-CM | POA: Diagnosis not present

## 2011-09-07 DIAGNOSIS — IMO0001 Reserved for inherently not codable concepts without codable children: Secondary | ICD-10-CM | POA: Diagnosis not present

## 2011-09-08 DIAGNOSIS — I4891 Unspecified atrial fibrillation: Secondary | ICD-10-CM | POA: Diagnosis not present

## 2011-09-08 DIAGNOSIS — H543 Unqualified visual loss, both eyes: Secondary | ICD-10-CM | POA: Diagnosis not present

## 2011-09-08 DIAGNOSIS — I5030 Unspecified diastolic (congestive) heart failure: Secondary | ICD-10-CM | POA: Diagnosis not present

## 2011-09-08 DIAGNOSIS — M1710 Unilateral primary osteoarthritis, unspecified knee: Secondary | ICD-10-CM | POA: Diagnosis not present

## 2011-09-08 DIAGNOSIS — IMO0001 Reserved for inherently not codable concepts without codable children: Secondary | ICD-10-CM | POA: Diagnosis not present

## 2011-09-09 DIAGNOSIS — H543 Unqualified visual loss, both eyes: Secondary | ICD-10-CM | POA: Diagnosis not present

## 2011-09-09 DIAGNOSIS — M1710 Unilateral primary osteoarthritis, unspecified knee: Secondary | ICD-10-CM | POA: Diagnosis not present

## 2011-09-09 DIAGNOSIS — I4891 Unspecified atrial fibrillation: Secondary | ICD-10-CM | POA: Diagnosis not present

## 2011-09-09 DIAGNOSIS — IMO0001 Reserved for inherently not codable concepts without codable children: Secondary | ICD-10-CM | POA: Diagnosis not present

## 2011-09-09 DIAGNOSIS — I5030 Unspecified diastolic (congestive) heart failure: Secondary | ICD-10-CM | POA: Diagnosis not present

## 2011-09-15 DIAGNOSIS — H543 Unqualified visual loss, both eyes: Secondary | ICD-10-CM | POA: Diagnosis not present

## 2011-09-15 DIAGNOSIS — M1710 Unilateral primary osteoarthritis, unspecified knee: Secondary | ICD-10-CM | POA: Diagnosis not present

## 2011-09-15 DIAGNOSIS — I5030 Unspecified diastolic (congestive) heart failure: Secondary | ICD-10-CM | POA: Diagnosis not present

## 2011-09-15 DIAGNOSIS — I4891 Unspecified atrial fibrillation: Secondary | ICD-10-CM | POA: Diagnosis not present

## 2011-09-15 DIAGNOSIS — IMO0001 Reserved for inherently not codable concepts without codable children: Secondary | ICD-10-CM | POA: Diagnosis not present

## 2011-09-23 DIAGNOSIS — N3941 Urge incontinence: Secondary | ICD-10-CM | POA: Diagnosis not present

## 2011-09-23 DIAGNOSIS — R350 Frequency of micturition: Secondary | ICD-10-CM | POA: Diagnosis not present

## 2011-09-30 DIAGNOSIS — H9190 Unspecified hearing loss, unspecified ear: Secondary | ICD-10-CM | POA: Diagnosis not present

## 2011-09-30 DIAGNOSIS — R636 Underweight: Secondary | ICD-10-CM | POA: Diagnosis not present

## 2011-09-30 DIAGNOSIS — H6120 Impacted cerumen, unspecified ear: Secondary | ICD-10-CM | POA: Diagnosis not present

## 2011-09-30 DIAGNOSIS — I4891 Unspecified atrial fibrillation: Secondary | ICD-10-CM | POA: Diagnosis not present

## 2011-10-08 DIAGNOSIS — M1710 Unilateral primary osteoarthritis, unspecified knee: Secondary | ICD-10-CM | POA: Diagnosis not present

## 2011-10-11 DIAGNOSIS — H902 Conductive hearing loss, unspecified: Secondary | ICD-10-CM | POA: Diagnosis not present

## 2011-10-11 DIAGNOSIS — H6120 Impacted cerumen, unspecified ear: Secondary | ICD-10-CM | POA: Diagnosis not present

## 2011-10-11 DIAGNOSIS — H8190 Unspecified disorder of vestibular function, unspecified ear: Secondary | ICD-10-CM | POA: Diagnosis not present

## 2011-11-02 DIAGNOSIS — Z7901 Long term (current) use of anticoagulants: Secondary | ICD-10-CM | POA: Diagnosis not present

## 2011-11-02 DIAGNOSIS — H543 Unqualified visual loss, both eyes: Secondary | ICD-10-CM | POA: Diagnosis not present

## 2011-11-02 DIAGNOSIS — Z5181 Encounter for therapeutic drug level monitoring: Secondary | ICD-10-CM | POA: Diagnosis not present

## 2011-11-02 DIAGNOSIS — F0390 Unspecified dementia without behavioral disturbance: Secondary | ICD-10-CM | POA: Diagnosis not present

## 2011-11-02 DIAGNOSIS — H3530 Unspecified macular degeneration: Secondary | ICD-10-CM | POA: Diagnosis not present

## 2011-11-02 DIAGNOSIS — L89109 Pressure ulcer of unspecified part of back, unspecified stage: Secondary | ICD-10-CM | POA: Diagnosis not present

## 2011-11-02 DIAGNOSIS — L8992 Pressure ulcer of unspecified site, stage 2: Secondary | ICD-10-CM | POA: Diagnosis not present

## 2011-11-03 DIAGNOSIS — H3530 Unspecified macular degeneration: Secondary | ICD-10-CM | POA: Diagnosis not present

## 2011-11-03 DIAGNOSIS — L89109 Pressure ulcer of unspecified part of back, unspecified stage: Secondary | ICD-10-CM | POA: Diagnosis not present

## 2011-11-03 DIAGNOSIS — H543 Unqualified visual loss, both eyes: Secondary | ICD-10-CM | POA: Diagnosis not present

## 2011-11-03 DIAGNOSIS — L8992 Pressure ulcer of unspecified site, stage 2: Secondary | ICD-10-CM | POA: Diagnosis not present

## 2011-11-03 DIAGNOSIS — F0390 Unspecified dementia without behavioral disturbance: Secondary | ICD-10-CM | POA: Diagnosis not present

## 2011-11-05 DIAGNOSIS — H3530 Unspecified macular degeneration: Secondary | ICD-10-CM | POA: Diagnosis not present

## 2011-11-05 DIAGNOSIS — L89109 Pressure ulcer of unspecified part of back, unspecified stage: Secondary | ICD-10-CM | POA: Diagnosis not present

## 2011-11-05 DIAGNOSIS — F0390 Unspecified dementia without behavioral disturbance: Secondary | ICD-10-CM | POA: Diagnosis not present

## 2011-11-05 DIAGNOSIS — H543 Unqualified visual loss, both eyes: Secondary | ICD-10-CM | POA: Diagnosis not present

## 2011-11-05 DIAGNOSIS — L8992 Pressure ulcer of unspecified site, stage 2: Secondary | ICD-10-CM | POA: Diagnosis not present

## 2011-11-09 DIAGNOSIS — L89109 Pressure ulcer of unspecified part of back, unspecified stage: Secondary | ICD-10-CM | POA: Diagnosis not present

## 2011-11-09 DIAGNOSIS — L8992 Pressure ulcer of unspecified site, stage 2: Secondary | ICD-10-CM | POA: Diagnosis not present

## 2011-11-09 DIAGNOSIS — H3530 Unspecified macular degeneration: Secondary | ICD-10-CM | POA: Diagnosis not present

## 2011-11-09 DIAGNOSIS — F0390 Unspecified dementia without behavioral disturbance: Secondary | ICD-10-CM | POA: Diagnosis not present

## 2011-11-09 DIAGNOSIS — H543 Unqualified visual loss, both eyes: Secondary | ICD-10-CM | POA: Diagnosis not present

## 2011-11-12 DIAGNOSIS — H3530 Unspecified macular degeneration: Secondary | ICD-10-CM | POA: Diagnosis not present

## 2011-11-12 DIAGNOSIS — F0390 Unspecified dementia without behavioral disturbance: Secondary | ICD-10-CM | POA: Diagnosis not present

## 2011-11-12 DIAGNOSIS — L8992 Pressure ulcer of unspecified site, stage 2: Secondary | ICD-10-CM | POA: Diagnosis not present

## 2011-11-12 DIAGNOSIS — H543 Unqualified visual loss, both eyes: Secondary | ICD-10-CM | POA: Diagnosis not present

## 2011-11-12 DIAGNOSIS — L89109 Pressure ulcer of unspecified part of back, unspecified stage: Secondary | ICD-10-CM | POA: Diagnosis not present

## 2011-11-16 DIAGNOSIS — L89109 Pressure ulcer of unspecified part of back, unspecified stage: Secondary | ICD-10-CM | POA: Diagnosis not present

## 2011-11-16 DIAGNOSIS — H3530 Unspecified macular degeneration: Secondary | ICD-10-CM | POA: Diagnosis not present

## 2011-11-16 DIAGNOSIS — F0390 Unspecified dementia without behavioral disturbance: Secondary | ICD-10-CM | POA: Diagnosis not present

## 2011-11-16 DIAGNOSIS — H543 Unqualified visual loss, both eyes: Secondary | ICD-10-CM | POA: Diagnosis not present

## 2011-11-16 DIAGNOSIS — L8992 Pressure ulcer of unspecified site, stage 2: Secondary | ICD-10-CM | POA: Diagnosis not present

## 2011-11-17 DIAGNOSIS — Z950 Presence of cardiac pacemaker: Secondary | ICD-10-CM | POA: Diagnosis not present

## 2011-11-17 DIAGNOSIS — L89109 Pressure ulcer of unspecified part of back, unspecified stage: Secondary | ICD-10-CM | POA: Diagnosis not present

## 2011-11-17 DIAGNOSIS — F0390 Unspecified dementia without behavioral disturbance: Secondary | ICD-10-CM | POA: Diagnosis not present

## 2011-11-17 DIAGNOSIS — H3530 Unspecified macular degeneration: Secondary | ICD-10-CM | POA: Diagnosis not present

## 2011-11-17 DIAGNOSIS — L8992 Pressure ulcer of unspecified site, stage 2: Secondary | ICD-10-CM | POA: Diagnosis not present

## 2011-11-17 DIAGNOSIS — H543 Unqualified visual loss, both eyes: Secondary | ICD-10-CM | POA: Diagnosis not present

## 2011-11-18 DIAGNOSIS — H3530 Unspecified macular degeneration: Secondary | ICD-10-CM | POA: Diagnosis not present

## 2011-11-18 DIAGNOSIS — F0390 Unspecified dementia without behavioral disturbance: Secondary | ICD-10-CM | POA: Diagnosis not present

## 2011-11-18 DIAGNOSIS — L89109 Pressure ulcer of unspecified part of back, unspecified stage: Secondary | ICD-10-CM | POA: Diagnosis not present

## 2011-11-18 DIAGNOSIS — L8992 Pressure ulcer of unspecified site, stage 2: Secondary | ICD-10-CM | POA: Diagnosis not present

## 2011-11-18 DIAGNOSIS — H543 Unqualified visual loss, both eyes: Secondary | ICD-10-CM | POA: Diagnosis not present

## 2011-11-22 DIAGNOSIS — F0390 Unspecified dementia without behavioral disturbance: Secondary | ICD-10-CM | POA: Diagnosis not present

## 2011-11-22 DIAGNOSIS — L8992 Pressure ulcer of unspecified site, stage 2: Secondary | ICD-10-CM | POA: Diagnosis not present

## 2011-11-22 DIAGNOSIS — H3530 Unspecified macular degeneration: Secondary | ICD-10-CM | POA: Diagnosis not present

## 2011-11-22 DIAGNOSIS — L89109 Pressure ulcer of unspecified part of back, unspecified stage: Secondary | ICD-10-CM | POA: Diagnosis not present

## 2011-11-22 DIAGNOSIS — H543 Unqualified visual loss, both eyes: Secondary | ICD-10-CM | POA: Diagnosis not present

## 2011-11-23 DIAGNOSIS — L89109 Pressure ulcer of unspecified part of back, unspecified stage: Secondary | ICD-10-CM | POA: Diagnosis not present

## 2011-11-23 DIAGNOSIS — H543 Unqualified visual loss, both eyes: Secondary | ICD-10-CM | POA: Diagnosis not present

## 2011-11-23 DIAGNOSIS — H3530 Unspecified macular degeneration: Secondary | ICD-10-CM | POA: Diagnosis not present

## 2011-11-23 DIAGNOSIS — F0390 Unspecified dementia without behavioral disturbance: Secondary | ICD-10-CM | POA: Diagnosis not present

## 2011-11-23 DIAGNOSIS — L8992 Pressure ulcer of unspecified site, stage 2: Secondary | ICD-10-CM | POA: Diagnosis not present

## 2011-11-24 DIAGNOSIS — L89109 Pressure ulcer of unspecified part of back, unspecified stage: Secondary | ICD-10-CM | POA: Diagnosis not present

## 2011-11-24 DIAGNOSIS — F0390 Unspecified dementia without behavioral disturbance: Secondary | ICD-10-CM | POA: Diagnosis not present

## 2011-11-24 DIAGNOSIS — H543 Unqualified visual loss, both eyes: Secondary | ICD-10-CM | POA: Diagnosis not present

## 2011-11-24 DIAGNOSIS — H3530 Unspecified macular degeneration: Secondary | ICD-10-CM | POA: Diagnosis not present

## 2011-11-24 DIAGNOSIS — I10 Essential (primary) hypertension: Secondary | ICD-10-CM | POA: Diagnosis not present

## 2011-11-24 DIAGNOSIS — IMO0002 Reserved for concepts with insufficient information to code with codable children: Secondary | ICD-10-CM | POA: Diagnosis not present

## 2011-11-24 DIAGNOSIS — L8992 Pressure ulcer of unspecified site, stage 2: Secondary | ICD-10-CM | POA: Diagnosis not present

## 2011-11-24 DIAGNOSIS — G8929 Other chronic pain: Secondary | ICD-10-CM | POA: Diagnosis not present

## 2011-11-26 DIAGNOSIS — H543 Unqualified visual loss, both eyes: Secondary | ICD-10-CM | POA: Diagnosis not present

## 2011-11-26 DIAGNOSIS — L89109 Pressure ulcer of unspecified part of back, unspecified stage: Secondary | ICD-10-CM | POA: Diagnosis not present

## 2011-11-26 DIAGNOSIS — H3530 Unspecified macular degeneration: Secondary | ICD-10-CM | POA: Diagnosis not present

## 2011-11-26 DIAGNOSIS — F0390 Unspecified dementia without behavioral disturbance: Secondary | ICD-10-CM | POA: Diagnosis not present

## 2011-11-26 DIAGNOSIS — L8992 Pressure ulcer of unspecified site, stage 2: Secondary | ICD-10-CM | POA: Diagnosis not present

## 2011-11-28 DIAGNOSIS — L89109 Pressure ulcer of unspecified part of back, unspecified stage: Secondary | ICD-10-CM | POA: Diagnosis not present

## 2011-11-28 DIAGNOSIS — H3530 Unspecified macular degeneration: Secondary | ICD-10-CM | POA: Diagnosis not present

## 2011-11-28 DIAGNOSIS — L8992 Pressure ulcer of unspecified site, stage 2: Secondary | ICD-10-CM | POA: Diagnosis not present

## 2011-11-28 DIAGNOSIS — F0390 Unspecified dementia without behavioral disturbance: Secondary | ICD-10-CM | POA: Diagnosis not present

## 2011-11-28 DIAGNOSIS — H543 Unqualified visual loss, both eyes: Secondary | ICD-10-CM | POA: Diagnosis not present

## 2011-11-29 DIAGNOSIS — L8992 Pressure ulcer of unspecified site, stage 2: Secondary | ICD-10-CM | POA: Diagnosis not present

## 2011-11-29 DIAGNOSIS — H543 Unqualified visual loss, both eyes: Secondary | ICD-10-CM | POA: Diagnosis not present

## 2011-11-29 DIAGNOSIS — N390 Urinary tract infection, site not specified: Secondary | ICD-10-CM | POA: Diagnosis not present

## 2011-11-29 DIAGNOSIS — L89109 Pressure ulcer of unspecified part of back, unspecified stage: Secondary | ICD-10-CM | POA: Diagnosis not present

## 2011-11-29 DIAGNOSIS — H3530 Unspecified macular degeneration: Secondary | ICD-10-CM | POA: Diagnosis not present

## 2011-11-29 DIAGNOSIS — F0390 Unspecified dementia without behavioral disturbance: Secondary | ICD-10-CM | POA: Diagnosis not present

## 2011-12-01 ENCOUNTER — Emergency Department (HOSPITAL_COMMUNITY)
Admission: EM | Admit: 2011-12-01 | Discharge: 2011-12-02 | Disposition: A | Discharge status: Home / Self Care | Payer: Medicare Other | Attending: Emergency Medicine | Admitting: Emergency Medicine

## 2011-12-01 ENCOUNTER — Encounter (HOSPITAL_COMMUNITY): Payer: Self-pay | Admitting: *Deleted

## 2011-12-01 DIAGNOSIS — D649 Anemia, unspecified: Secondary | ICD-10-CM | POA: Insufficient documentation

## 2011-12-01 DIAGNOSIS — Z87891 Personal history of nicotine dependence: Secondary | ICD-10-CM | POA: Diagnosis not present

## 2011-12-01 DIAGNOSIS — I509 Heart failure, unspecified: Secondary | ICD-10-CM | POA: Insufficient documentation

## 2011-12-01 DIAGNOSIS — E039 Hypothyroidism, unspecified: Secondary | ICD-10-CM | POA: Diagnosis not present

## 2011-12-01 DIAGNOSIS — Z7901 Long term (current) use of anticoagulants: Secondary | ICD-10-CM | POA: Diagnosis not present

## 2011-12-01 DIAGNOSIS — G2581 Restless legs syndrome: Secondary | ICD-10-CM | POA: Insufficient documentation

## 2011-12-01 DIAGNOSIS — Z950 Presence of cardiac pacemaker: Secondary | ICD-10-CM | POA: Diagnosis not present

## 2011-12-01 DIAGNOSIS — I4891 Unspecified atrial fibrillation: Secondary | ICD-10-CM | POA: Insufficient documentation

## 2011-12-01 DIAGNOSIS — R319 Hematuria, unspecified: Secondary | ICD-10-CM | POA: Diagnosis not present

## 2011-12-01 DIAGNOSIS — F0390 Unspecified dementia without behavioral disturbance: Secondary | ICD-10-CM | POA: Insufficient documentation

## 2011-12-01 DIAGNOSIS — M129 Arthropathy, unspecified: Secondary | ICD-10-CM | POA: Diagnosis not present

## 2011-12-01 DIAGNOSIS — L89109 Pressure ulcer of unspecified part of back, unspecified stage: Secondary | ICD-10-CM | POA: Diagnosis not present

## 2011-12-01 DIAGNOSIS — Z79899 Other long term (current) drug therapy: Secondary | ICD-10-CM | POA: Insufficient documentation

## 2011-12-01 DIAGNOSIS — H543 Unqualified visual loss, both eyes: Secondary | ICD-10-CM | POA: Diagnosis not present

## 2011-12-01 DIAGNOSIS — H3530 Unspecified macular degeneration: Secondary | ICD-10-CM | POA: Diagnosis not present

## 2011-12-01 DIAGNOSIS — L8992 Pressure ulcer of unspecified site, stage 2: Secondary | ICD-10-CM | POA: Diagnosis not present

## 2011-12-01 LAB — URINALYSIS, ROUTINE W REFLEX MICROSCOPIC
Nitrite: NEGATIVE
Protein, ur: >300 mg/dL — AB
Urobilinogen, UA: 0.2 mg/dL (ref 0.0–1.0)

## 2011-12-01 LAB — URINE MICROSCOPIC-ADD ON

## 2011-12-01 MED ORDER — HYDROCODONE-ACETAMINOPHEN 5-325 MG PO TABS
1.0000 | ORAL_TABLET | Freq: Once | ORAL | Status: AC
  Administered 2011-12-02: 1 via ORAL
  Filled 2011-12-01: qty 1

## 2011-12-01 NOTE — ED Notes (Signed)
Pt in from home by ems. Usually uses condom cath due to incontinence. Had foley placed on Sunday, began passing blood through urine, so had catheter removed Monday afternoon. Pt was told to be seen if bleeding continued. Pt denies pain. One dark clot passed today.

## 2011-12-01 NOTE — ED Notes (Signed)
[id number]<HY> Expected date:<BR> Expected time: 7:32 PM<BR> Means of arrival:<BR> Comments:<BR> M72 - 89yoM Blood in urine

## 2011-12-01 NOTE — ED Provider Notes (Signed)
History     CSN: [id number]  Arrival date & time 12/01/11  1958   First MD Initiated Contact with Patient 12/01/11 2309      Chief Complaint  Patient presents with  . Hematuria    (Consider location/radiation/quality/duration/timing/severity/associated sxs/prior treatment) HPI Comments: Pt had a foley catheter placed on Sunday for trouble with incontinence placed by a home nurse.  It was then removed on Monday.  Pt noticed more blood today so he came to the ED.  Pt is still able to urinate.    Patient is a 78 y.o. male presenting with hematuria. The history is provided by the patient.  Hematuria This is a new problem. The current episode started in the past 7 days. The problem has been gradually worsening since onset. He describes the hematuria as gross hematuria. He is experiencing no pain. He describes his urine color as dark red. Irritative symptoms do not include frequency or urgency. Pertinent negatives include no abdominal pain, dysuria, fever, inability to urinate, nausea or vomiting. (Incontinence at night)    Past Medical History  Diagnosis Date  . CHF (congestive heart failure)   . Arthritis   . Pacemaker   . Incontinence   . Pneumonia 2011; 07/29/11  . Neuropathy   . Heart murmur   . Dysrhythmia     paced  . Hypothyroidism   . Restless leg syndrome   . Blood transfusion   . Anemia   . Lower GI bleeding 2011    "from ATB I wasn't suppose to have had ordered"  . Dementia 07/29/11     not observed by this RN  . Atrial fibrillation     Past Surgical History  Procedure Date  . Insert / replace / remove pacemaker ~ 2006    initial placement  . Insert / replace / remove pacemaker 05/2009  . Knee arthroscopy ~ 2000's    right  . Back surgery     "as a kid"  . Inguinal hernia repair 1960's    left  . Inguinal hernia repair 1973    right    No family history on file.  History  Substance Use Topics  . Smoking status: Former Smoker -- 2.0 packs/day for 15  years    Types: Cigarettes    Quit date: 06/15/1971  . Smokeless tobacco: Never Used  . Alcohol Use: Yes     07/29/11 "used to drink some; haven't now in quite a few years"      Review of Systems  Constitutional: Negative for fever.  Gastrointestinal: Negative for nausea, vomiting and abdominal pain.  Genitourinary: Positive for hematuria. Negative for dysuria, urgency and frequency.  All other systems reviewed and are negative.    Allergies  Penicillins and Uroxatral  Home Medications   Current Outpatient Rx  Name Route Sig Dispense Refill  . CALCIUM 600/VITAMIN D PO Oral Take 1 tablet by mouth 2 (two) times daily.     . CYCLOBENZAPRINE HCL 10 MG PO TABS Oral Take 10 mg by mouth at bedtime.    . DONEPEZIL HCL 5 MG PO TABS Oral Take 5 mg by mouth at bedtime.     . FENTANYL 25 MCG/HR TD PT72 Transdermal Place 1 patch onto the skin every 3 (three) days.    Marland Kitchen HYDROCODONE-ACETAMINOPHEN 10-500 MG PO TABS Oral Take 1 tablet by mouth every 6 (six) hours as needed. For pain.    Marland Kitchen LEVOTHYROXINE SODIUM 88 MCG PO TABS Oral Take 88 mcg by mouth every  morning.    Marland Kitchen LORAZEPAM 0.5 MG PO TABS Oral Take 1 mg by mouth at bedtime. For anxiety    . MEMANTINE HCL 10 MG PO TABS Oral Take 10 mg by mouth 2 (two) times daily.    Marland Kitchen MIRABEGRON ER 25 MG PO TB24 Oral Take 25 mg by mouth daily.    . OMEGA-3-ACID ETHYL ESTERS 1 G PO CAPS Oral Take 1 g by mouth 2 (two) times daily.     Marland Kitchen SILVER SULFADIAZINE 1 % EX CREA Topical Apply 1 application topically daily. Applies to rear    . SPIRONOLACTONE 25 MG PO TABS Oral Take 12.5 mg by mouth daily.    Marland Kitchen TEMAZEPAM 15 MG PO CAPS Oral Take 15 mg by mouth at bedtime.    Marland Kitchen VITAMIN C 500 MG PO TABS Oral Take 500 mg by mouth daily.     . WARFARIN SODIUM 1 MG PO TABS Oral Take 6 mg by mouth See admin instructions. Takes 6mg  tablet daily except on mondays and wednesdays    . WARFARIN SODIUM 5 MG PO TABS Oral Take 5 mg by mouth daily. Takes 1 tablet daily on mondays and  wednesdays      BP 119/55  Pulse 74  Temp 98.7 F (37.1 C) (Oral)  Resp 18  Ht 5\' 6"  (1.676 m)  Wt 120 lb (54.432 kg)  BMI 19.37 kg/m2  SpO2 100%  Physical Exam  Nursing note and vitals reviewed. Constitutional: He appears well-developed and well-nourished. No distress.  HENT:  Head: Normocephalic and atraumatic.  Right Ear: External ear normal.  Left Ear: External ear normal.  Eyes: Conjunctivae are normal. Right eye exhibits no discharge. Left eye exhibits no discharge. No scleral icterus.  Neck: Neck supple. No tracheal deviation present.  Cardiovascular: Normal rate, regular rhythm and intact distal pulses.   Pulmonary/Chest: Effort normal and breath sounds normal. No stridor. No respiratory distress. He has no wheezes. He has no rales.  Abdominal: Soft. Bowel sounds are normal. He exhibits no distension. There is no tenderness. There is no rebound and no guarding.  Genitourinary: Penis normal.       Small amount of blood noted in adult diaper  Musculoskeletal: He exhibits no edema and no tenderness.  Neurological: He is alert. He has normal strength. No sensory deficit. Cranial nerve deficit:  no gross defecits noted. He exhibits normal muscle tone. He displays no seizure activity. Coordination normal.  Skin: Skin is warm and dry. No rash noted.  Psychiatric: He has a normal mood and affect.    ED Course  Procedures (including critical care time)  Labs Reviewed  URINALYSIS, ROUTINE W REFLEX MICROSCOPIC - Abnormal; Notable for the following:    Color, Urine RED (*)  BIOCHEMICALS MAY BE AFFECTED BY COLOR   APPearance CLOUDY (*)     Hgb urine dipstick LARGE (*)     Ketones, ur TRACE (*)     Protein, ur >300 (*)     Leukocytes, UA SMALL (*)     All other components within normal limits  CBC - Abnormal; Notable for the following:    RBC 3.42 (*)     Hemoglobin 11.1 (*)     HCT 32.5 (*)     All other components within normal limits  BASIC METABOLIC PANEL - Abnormal;  Notable for the following:    Sodium 129 (*)     Chloride 94 (*)     GFR calc non Af Amer 87 (*)  All other components within normal limits  PROTIME-INR - Abnormal; Notable for the following:    Prothrombin Time 34.2 (*)     INR 3.32 (*)     All other components within normal limits  URINE MICROSCOPIC-ADD ON  URINE CULTURE     MDM  The patient has hematuria but there is no evidence to suggest infection based on the urinalysis. He had a bladder scan procedure done after urinating and there was 160 mL of urine in the bladder. Patient's not show any evidence of urinary retention. His INR is in the therapeutic range. His anemia appears stable. At this time there does not appear to be evidence of an acute emergency medical condition. Hematuria is most likely related to some small trauma associated with the Foley catheter placement. I recommended the patient follow up with his urologist to make sure that this resolves. He will call tomorrow.  At this time I will have him continue his Coumadin. .  Warning signs and precautions were discussed that should prompt return to emergency room       Celene Kras, MD 12/02/11 [phone]

## 2011-12-02 LAB — BASIC METABOLIC PANEL
BUN: 21 mg/dL (ref 6–23)
GFR calc Af Amer: >90 mL/min (ref >90)
GFR calc non Af Amer: 87 mL/min — ABNORMAL LOW (ref >90)
Potassium: 4.1 mEq/L (ref 3.5–5.1)

## 2011-12-02 LAB — CBC
HCT: 32.5 % — ABNORMAL LOW (ref 39.0–52.0)
MCHC: 34.2 g/dL (ref 30.0–36.0)
RDW: 14.4 % (ref 11.5–15.5)

## 2011-12-02 NOTE — Discharge Instructions (Signed)
Hematuria, Adult  Hematuria (blood in your urine) can be caused by a bladder infection (cystitis), kidney infection (pyelonephritis), prostate infection (prostatitis), or kidney stone. Infections will usually respond to antibiotics (medications which kill germs), and a kidney stone will usually pass through your urine without further treatment. If you were put on antibiotics, take all the medicine until gone. You may feel better in a few days, but take all of your medicine or the infection may not respond and become more difficult to treat. If antibiotics were not given, an infection did not cause the blood in the urine. A further work up to find out the reason may be needed.  HOME CARE INSTRUCTIONS   · Drink lots of fluid, 3 to 4 quarts a day. If you have been diagnosed with an infection, cranberry juice is especially recommended, in addition to large amounts of water.  · Avoid caffeine, tea, and carbonated beverages, because they tend to irritate the bladder.  · Avoid alcohol as it may irritate the prostate.  · Only take over-the-counter or prescription medicines for pain, discomfort, or fever as directed by your caregiver.  · If you have been diagnosed with a kidney stone follow your caregivers instructions regarding straining your urine to catch the stone.  TO PREVENT FURTHER INFECTIONS:  · Empty the bladder often. Avoid holding urine for long periods of time.  · After a bowel movement, women should cleanse front to back. Use each tissue only once.  · Empty the bladder before and after sexual intercourse if you are a male.  · Return to your caregiver if you develop back pain, fever, nausea (feeling sick to your stomach), vomiting, or your symptoms (problems) are not better in 3 days. Return sooner if you are getting worse.  If you have been requested to return for further testing make sure to keep your appointments. If an infection is not the cause of blood in your urine, X-rays may be required. Your caregiver  will discuss this with you.  SEEK IMMEDIATE MEDICAL CARE IF:   · You have a persistent fever over 102° F (38.9° C).  · You develop severe vomiting and are unable to keep the medication down.  · You develop severe back or abdominal pain despite taking your medications.  · You begin passing a large amount of blood or clots in your urine.  · You feel extremely weak or faint, or pass out.  MAKE SURE YOU:   · Understand these instructions.  · Will watch your condition.  · Will get help right away if you are not doing well or get worse.  Document Released: 05/31/2005 Document Revised: 05/20/2011 Document Reviewed: 01/18/2008  [employer]® Patient Information ©2012 [employer].

## 2011-12-03 DIAGNOSIS — N3941 Urge incontinence: Secondary | ICD-10-CM | POA: Diagnosis not present

## 2011-12-03 DIAGNOSIS — R310 Gross hematuria: Secondary | ICD-10-CM | POA: Diagnosis not present

## 2011-12-03 DIAGNOSIS — R350 Frequency of micturition: Secondary | ICD-10-CM | POA: Diagnosis not present

## 2011-12-04 LAB — URINE CULTURE: Colony Count: >=100000

## 2011-12-04 NOTE — ED Notes (Signed)
[facility] lab reports mrsa infection in urine; copy of lab taken to EDP office for review.

## 2011-12-05 NOTE — ED Notes (Signed)
Chart returned from EDP office. Prescribed Bactrim DS 800/160 mg. Take one tab PO bid x 7 days. Follow-up with PCP. Prescribed by Rhea Bleacher PA-C.

## 2011-12-06 DIAGNOSIS — H543 Unqualified visual loss, both eyes: Secondary | ICD-10-CM | POA: Diagnosis not present

## 2011-12-06 DIAGNOSIS — F0390 Unspecified dementia without behavioral disturbance: Secondary | ICD-10-CM | POA: Diagnosis not present

## 2011-12-06 DIAGNOSIS — H3530 Unspecified macular degeneration: Secondary | ICD-10-CM | POA: Diagnosis not present

## 2011-12-06 DIAGNOSIS — L89109 Pressure ulcer of unspecified part of back, unspecified stage: Secondary | ICD-10-CM | POA: Diagnosis not present

## 2011-12-06 DIAGNOSIS — L8992 Pressure ulcer of unspecified site, stage 2: Secondary | ICD-10-CM | POA: Diagnosis not present

## 2011-12-06 NOTE — ED Notes (Signed)
Copy of labs faxed to [facility]

## 2011-12-09 DIAGNOSIS — H543 Unqualified visual loss, both eyes: Secondary | ICD-10-CM | POA: Diagnosis not present

## 2011-12-09 DIAGNOSIS — H3530 Unspecified macular degeneration: Secondary | ICD-10-CM | POA: Diagnosis not present

## 2011-12-09 DIAGNOSIS — L89109 Pressure ulcer of unspecified part of back, unspecified stage: Secondary | ICD-10-CM | POA: Diagnosis not present

## 2011-12-09 DIAGNOSIS — F0390 Unspecified dementia without behavioral disturbance: Secondary | ICD-10-CM | POA: Diagnosis not present

## 2011-12-09 DIAGNOSIS — L8992 Pressure ulcer of unspecified site, stage 2: Secondary | ICD-10-CM | POA: Diagnosis not present

## 2011-12-13 DIAGNOSIS — R310 Gross hematuria: Secondary | ICD-10-CM | POA: Diagnosis not present

## 2011-12-13 DIAGNOSIS — N390 Urinary tract infection, site not specified: Secondary | ICD-10-CM | POA: Diagnosis not present

## 2011-12-14 DIAGNOSIS — H543 Unqualified visual loss, both eyes: Secondary | ICD-10-CM | POA: Diagnosis not present

## 2011-12-14 DIAGNOSIS — L89109 Pressure ulcer of unspecified part of back, unspecified stage: Secondary | ICD-10-CM | POA: Diagnosis not present

## 2011-12-14 DIAGNOSIS — F0390 Unspecified dementia without behavioral disturbance: Secondary | ICD-10-CM | POA: Diagnosis not present

## 2011-12-14 DIAGNOSIS — L8992 Pressure ulcer of unspecified site, stage 2: Secondary | ICD-10-CM | POA: Diagnosis not present

## 2011-12-14 DIAGNOSIS — H3530 Unspecified macular degeneration: Secondary | ICD-10-CM | POA: Diagnosis not present

## 2011-12-17 DIAGNOSIS — H543 Unqualified visual loss, both eyes: Secondary | ICD-10-CM | POA: Diagnosis not present

## 2011-12-17 DIAGNOSIS — F0390 Unspecified dementia without behavioral disturbance: Secondary | ICD-10-CM | POA: Diagnosis not present

## 2011-12-17 DIAGNOSIS — L8992 Pressure ulcer of unspecified site, stage 2: Secondary | ICD-10-CM | POA: Diagnosis not present

## 2011-12-17 DIAGNOSIS — H3530 Unspecified macular degeneration: Secondary | ICD-10-CM | POA: Diagnosis not present

## 2011-12-17 DIAGNOSIS — L89109 Pressure ulcer of unspecified part of back, unspecified stage: Secondary | ICD-10-CM | POA: Diagnosis not present

## 2011-12-21 DIAGNOSIS — F0390 Unspecified dementia without behavioral disturbance: Secondary | ICD-10-CM | POA: Diagnosis not present

## 2011-12-21 DIAGNOSIS — L8992 Pressure ulcer of unspecified site, stage 2: Secondary | ICD-10-CM | POA: Diagnosis not present

## 2011-12-21 DIAGNOSIS — H3530 Unspecified macular degeneration: Secondary | ICD-10-CM | POA: Diagnosis not present

## 2011-12-21 DIAGNOSIS — L89109 Pressure ulcer of unspecified part of back, unspecified stage: Secondary | ICD-10-CM | POA: Diagnosis not present

## 2011-12-21 DIAGNOSIS — H543 Unqualified visual loss, both eyes: Secondary | ICD-10-CM | POA: Diagnosis not present

## 2011-12-24 ENCOUNTER — Encounter (HOSPITAL_BASED_OUTPATIENT_CLINIC_OR_DEPARTMENT_OTHER): Payer: Self-pay | Admitting: Emergency Medicine

## 2011-12-24 ENCOUNTER — Emergency Department (HOSPITAL_BASED_OUTPATIENT_CLINIC_OR_DEPARTMENT_OTHER): Payer: Medicare Other

## 2011-12-24 ENCOUNTER — Inpatient Hospital Stay (HOSPITAL_BASED_OUTPATIENT_CLINIC_OR_DEPARTMENT_OTHER)
Admission: EM | Admit: 2011-12-24 | Discharge: 2012-01-05 | DRG: 871 | Disposition: A | Discharge status: Skilled Nursing Facility | Payer: Medicare Other | Attending: Internal Medicine | Admitting: Internal Medicine

## 2011-12-24 DIAGNOSIS — J984 Other disorders of lung: Secondary | ICD-10-CM | POA: Diagnosis not present

## 2011-12-24 DIAGNOSIS — E871 Hypo-osmolality and hyponatremia: Secondary | ICD-10-CM | POA: Diagnosis present

## 2011-12-24 DIAGNOSIS — R339 Retention of urine, unspecified: Secondary | ICD-10-CM | POA: Diagnosis present

## 2011-12-24 DIAGNOSIS — I4891 Unspecified atrial fibrillation: Secondary | ICD-10-CM | POA: Diagnosis present

## 2011-12-24 DIAGNOSIS — M4800 Spinal stenosis, site unspecified: Secondary | ICD-10-CM | POA: Diagnosis present

## 2011-12-24 DIAGNOSIS — E8779 Other fluid overload: Secondary | ICD-10-CM | POA: Diagnosis not present

## 2011-12-24 DIAGNOSIS — A4902 Methicillin resistant Staphylococcus aureus infection, unspecified site: Secondary | ICD-10-CM | POA: Diagnosis not present

## 2011-12-24 DIAGNOSIS — Z66 Do not resuscitate: Secondary | ICD-10-CM | POA: Diagnosis present

## 2011-12-24 DIAGNOSIS — R509 Fever, unspecified: Secondary | ICD-10-CM | POA: Diagnosis present

## 2011-12-24 DIAGNOSIS — R627 Adult failure to thrive: Secondary | ICD-10-CM | POA: Diagnosis present

## 2011-12-24 DIAGNOSIS — Z950 Presence of cardiac pacemaker: Secondary | ICD-10-CM | POA: Diagnosis not present

## 2011-12-24 DIAGNOSIS — A4901 Methicillin susceptible Staphylococcus aureus infection, unspecified site: Secondary | ICD-10-CM | POA: Diagnosis not present

## 2011-12-24 DIAGNOSIS — I059 Rheumatic mitral valve disease, unspecified: Secondary | ICD-10-CM | POA: Diagnosis present

## 2011-12-24 DIAGNOSIS — R5381 Other malaise: Secondary | ICD-10-CM | POA: Diagnosis not present

## 2011-12-24 DIAGNOSIS — E039 Hypothyroidism, unspecified: Secondary | ICD-10-CM | POA: Diagnosis present

## 2011-12-24 DIAGNOSIS — N390 Urinary tract infection, site not specified: Secondary | ICD-10-CM | POA: Diagnosis not present

## 2011-12-24 DIAGNOSIS — H543 Unqualified visual loss, both eyes: Secondary | ICD-10-CM | POA: Diagnosis not present

## 2011-12-24 DIAGNOSIS — D72829 Elevated white blood cell count, unspecified: Secondary | ICD-10-CM | POA: Diagnosis present

## 2011-12-24 DIAGNOSIS — R5383 Other fatigue: Secondary | ICD-10-CM | POA: Diagnosis not present

## 2011-12-24 DIAGNOSIS — I2510 Atherosclerotic heart disease of native coronary artery without angina pectoris: Secondary | ICD-10-CM | POA: Diagnosis present

## 2011-12-24 DIAGNOSIS — R109 Unspecified abdominal pain: Secondary | ICD-10-CM | POA: Diagnosis present

## 2011-12-24 DIAGNOSIS — J90 Pleural effusion, not elsewhere classified: Secondary | ICD-10-CM | POA: Diagnosis not present

## 2011-12-24 DIAGNOSIS — A4102 Sepsis due to Methicillin resistant Staphylococcus aureus: Secondary | ICD-10-CM | POA: Diagnosis not present

## 2011-12-24 DIAGNOSIS — J189 Pneumonia, unspecified organism: Secondary | ICD-10-CM

## 2011-12-24 DIAGNOSIS — R6889 Other general symptoms and signs: Secondary | ICD-10-CM | POA: Diagnosis not present

## 2011-12-24 DIAGNOSIS — R6520 Severe sepsis without septic shock: Secondary | ICD-10-CM | POA: Diagnosis present

## 2011-12-24 DIAGNOSIS — F068 Other specified mental disorders due to known physiological condition: Secondary | ICD-10-CM | POA: Diagnosis not present

## 2011-12-24 DIAGNOSIS — I509 Heart failure, unspecified: Secondary | ICD-10-CM | POA: Diagnosis present

## 2011-12-24 DIAGNOSIS — I5023 Acute on chronic systolic (congestive) heart failure: Secondary | ICD-10-CM | POA: Diagnosis present

## 2011-12-24 DIAGNOSIS — F0390 Unspecified dementia without behavioral disturbance: Secondary | ICD-10-CM | POA: Diagnosis present

## 2011-12-24 DIAGNOSIS — R7881 Bacteremia: Secondary | ICD-10-CM | POA: Diagnosis present

## 2011-12-24 DIAGNOSIS — G8929 Other chronic pain: Secondary | ICD-10-CM | POA: Diagnosis not present

## 2011-12-24 DIAGNOSIS — Z5189 Encounter for other specified aftercare: Secondary | ICD-10-CM | POA: Diagnosis not present

## 2011-12-24 DIAGNOSIS — L89109 Pressure ulcer of unspecified part of back, unspecified stage: Secondary | ICD-10-CM | POA: Diagnosis present

## 2011-12-24 DIAGNOSIS — A4159 Other Gram-negative sepsis: Secondary | ICD-10-CM | POA: Diagnosis not present

## 2011-12-24 DIAGNOSIS — L8992 Pressure ulcer of unspecified site, stage 2: Secondary | ICD-10-CM | POA: Diagnosis present

## 2011-12-24 DIAGNOSIS — R918 Other nonspecific abnormal finding of lung field: Secondary | ICD-10-CM | POA: Diagnosis not present

## 2011-12-24 DIAGNOSIS — R531 Weakness: Secondary | ICD-10-CM | POA: Diagnosis present

## 2011-12-24 DIAGNOSIS — H3530 Unspecified macular degeneration: Secondary | ICD-10-CM | POA: Diagnosis not present

## 2011-12-24 DIAGNOSIS — E0590 Thyrotoxicosis, unspecified without thyrotoxic crisis or storm: Secondary | ICD-10-CM | POA: Diagnosis present

## 2011-12-24 DIAGNOSIS — J811 Chronic pulmonary edema: Secondary | ICD-10-CM | POA: Diagnosis not present

## 2011-12-24 DIAGNOSIS — A419 Sepsis, unspecified organism: Secondary | ICD-10-CM | POA: Diagnosis present

## 2011-12-24 DIAGNOSIS — B958 Unspecified staphylococcus as the cause of diseases classified elsewhere: Secondary | ICD-10-CM | POA: Diagnosis not present

## 2011-12-24 LAB — COMPREHENSIVE METABOLIC PANEL
AST: 26 U/L (ref 0–37)
Albumin: 3.6 g/dL (ref 3.5–5.2)
Alkaline Phosphatase: 73 U/L (ref 39–117)
Chloride: 95 mEq/L — ABNORMAL LOW (ref 96–112)
Potassium: 4.1 mEq/L (ref 3.5–5.1)
Sodium: 130 mEq/L — ABNORMAL LOW (ref 135–145)
Total Bilirubin: 0.7 mg/dL (ref 0.3–1.2)

## 2011-12-24 LAB — URINE MICROSCOPIC-ADD ON

## 2011-12-24 LAB — URINALYSIS, ROUTINE W REFLEX MICROSCOPIC
Glucose, UA: NEGATIVE mg/dL
Ketones, ur: 15 mg/dL — AB
Protein, ur: 30 mg/dL — AB
pH: 5.5 (ref 5.0–8.0)

## 2011-12-24 LAB — PROTIME-INR: Prothrombin Time: 20.4 seconds — ABNORMAL HIGH (ref 11.6–15.2)

## 2011-12-24 LAB — CBC WITH DIFFERENTIAL/PLATELET
Basophils Absolute: 0 10*3/uL (ref 0.0–0.1)
Basophils Relative: 0 % (ref 0–1)
Hemoglobin: 10.9 g/dL — ABNORMAL LOW (ref 13.0–17.0)
MCHC: 34.1 g/dL (ref 30.0–36.0)
Neutro Abs: 6.8 10*3/uL (ref 1.7–7.7)
Neutrophils Relative %: 81 % — ABNORMAL HIGH (ref 43–77)
Platelets: 170 10*3/uL (ref 150–400)
RDW: 14.4 % (ref 11.5–15.5)

## 2011-12-24 MED ORDER — LEVOFLOXACIN IN D5W 750 MG/150ML IV SOLN
750.0000 mg | Freq: Once | INTRAVENOUS | Status: AC
  Administered 2011-12-24: 750 mg via INTRAVENOUS
  Filled 2011-12-24: qty 150

## 2011-12-24 MED ORDER — HYDROCODONE-ACETAMINOPHEN 5-325 MG PO TABS
ORAL_TABLET | ORAL | Status: AC
  Filled 2011-12-24: qty 1

## 2011-12-24 MED ORDER — HYDROCODONE-ACETAMINOPHEN 5-325 MG PO TABS
1.0000 | ORAL_TABLET | Freq: Once | ORAL | Status: AC
  Administered 2011-12-24: 1 via ORAL

## 2011-12-24 MED ORDER — HYDROCODONE-ACETAMINOPHEN 5-325 MG PO TABS: 1.0000 | ORAL_TABLET | Freq: Once | ORAL | Status: DC

## 2011-12-24 MED ORDER — SODIUM CHLORIDE 0.9 % IV SOLN
INTRAVENOUS | Status: AC
  Administered 2011-12-25: 01:00:00 via INTRAVENOUS

## 2011-12-24 NOTE — ED Notes (Signed)
Pt lives in private home with daily home health care.  Weakness and fever started yesterday.  Denies N/V. Chronic back pain. Ativan 1mg  po and Tylenol 650mg  po given prior to EMS arrival. Orthostatic VS done by EMS - no changes.

## 2011-12-24 NOTE — ED Provider Notes (Signed)
History     CSN: [id number]  Arrival date & time 12/24/11  1616   First MD Initiated Contact with Patient 12/24/11 1721      Chief Complaint  Patient presents with  . Fever  . Weakness    (Consider location/radiation/quality/duration/timing/severity/associated sxs/prior treatment) HPI  C/o weakness this morning. Fever prior to arrival. Min cough, no SOB past baseline. Denies CP/palpitations. C/O chronic lower back pain at baseline. Denies abdominal pain/N/V. Denies hematuria/dysuria. Urinating more freq than usual. Denies rash. He does have sacral ulcers- per home health (in ED) improving from baseline. Denies sick contacts.  No pain in ears, no sore throat. Recent UTI- course of abx complete.   ED Notes, ED Provider Notes from 12/24/11 0000 to 12/24/11 16:24:44       Alleen Borne, RN 12/24/2011 16:19      Pt lives in private home with daily home health care. Weakness and fever started yesterday. Denies N/V. Chronic back pain. Ativan 1mg  po and Tylenol 650mg  po given prior to EMS arrival. Orthostatic VS done by EMS - no changes.     Past Medical History  Diagnosis Date  . CHF (congestive heart failure)   . Arthritis   . Pacemaker   . Incontinence   . Pneumonia 2011; 07/29/11  . Neuropathy   . Heart murmur   . Dysrhythmia     paced  . Hypothyroidism   . Restless leg syndrome   . Blood transfusion   . Anemia   . Lower GI bleeding 2011    "from ATB I wasn't suppose to have had ordered"  . Dementia 07/29/11     not observed by this RN  . Atrial fibrillation     Past Surgical History  Procedure Date  . Insert / replace / remove pacemaker ~ 2006    initial placement  . Insert / replace / remove pacemaker 05/2009  . Knee arthroscopy ~ 2000's    right  . Back surgery     "as a kid"  . Inguinal hernia repair 1960's    left  . Inguinal hernia repair 1973    right    No family history on file.  History  Substance Use Topics  . Smoking status: Former Smoker --  2.0 packs/day for 15 years    Types: Cigarettes    Quit date: 06/15/1971  . Smokeless tobacco: Never Used  . Alcohol Use: No     07/29/11 "used to drink some; haven't now in quite a few years"    Review of Systems  All other systems reviewed and are negative.   except as noted HPI   Allergies  Penicillins and Uroxatral  Home Medications   Current Outpatient Rx  Name Route Sig Dispense Refill  . CALCIUM 600/VITAMIN D PO Oral Take 1 tablet by mouth 2 (two) times daily.     . DONEPEZIL HCL 5 MG PO TABS Oral Take 5 mg by mouth at bedtime.     Marland Kitchen HYDROCODONE-ACETAMINOPHEN 10-500 MG PO TABS Oral Take 1 tablet by mouth every 6 (six) hours as needed. For pain.    Marland Kitchen LEVOTHYROXINE SODIUM 88 MCG PO TABS Oral Take 88 mcg by mouth every morning.    Marland Kitchen LORAZEPAM 0.5 MG PO TABS Oral Take 1 mg by mouth at bedtime. For anxiety    . MEMANTINE HCL 10 MG PO TABS Oral Take 10 mg by mouth 2 (two) times daily.    Marland Kitchen MIRABEGRON ER 25 MG PO TB24 Oral Take  25 mg by mouth daily.    . OMEGA-3-ACID ETHYL ESTERS 1 G PO CAPS Oral Take 1 g by mouth 2 (two) times daily.     Marland Kitchen POLYETHYL GLYCOL-PROPYL GLYCOL 0.4-0.3 % OP SOLN Ophthalmic Apply to eye.    Marland Kitchen SILVER SULFADIAZINE 1 % EX CREA Topical Apply 1 application topically daily. Applies to rear    . VITAMIN C 500 MG PO TABS Oral Take 500 mg by mouth daily.     . WARFARIN SODIUM 5 MG PO TABS Oral Take 5 mg by mouth daily. Takes 1 tablet daily on mondays and wednesdays    . CYCLOBENZAPRINE HCL 10 MG PO TABS Oral Take 10 mg by mouth at bedtime.    . FENTANYL 25 MCG/HR TD PT72 Transdermal Place 1 patch onto the skin every 3 (three) days.    Marland Kitchen SPIRONOLACTONE 25 MG PO TABS Oral Take 12.5 mg by mouth daily.    Marland Kitchen TEMAZEPAM 15 MG PO CAPS Oral Take 15 mg by mouth at bedtime.    . WARFARIN SODIUM 1 MG PO TABS Oral Take 6 mg by mouth See admin instructions. Takes 6mg  tablet daily except on mondays and wednesdays      BP 114/53  Pulse 82  Temp 99.1 F (37.3 C) (Oral)   Resp 16  Ht 5\' 6"  (1.676 m)  Wt 125 lb (56.7 kg)  BMI 20.18 kg/m2  SpO2 96%  Physical Exam  Nursing note and vitals reviewed. Constitutional: He is oriented to person, place, and time. He appears well-developed and well-nourished. No distress.  HENT:  Head: Atraumatic.  Mouth/Throat: Oropharynx is clear and moist.  Eyes: Conjunctivae are normal. Pupils are equal, round, and reactive to light.  Neck: Neck supple.  Cardiovascular: Normal rate, regular rhythm, normal heart sounds and intact distal pulses.  Exam reveals no gallop and no friction rub.   No murmur heard. Pulmonary/Chest: Effort normal. No respiratory distress. He has no wheezes.       +crackles Rt base  Abdominal: Soft. Bowel sounds are normal. There is no tenderness. There is no rebound and no guarding.  Musculoskeletal: Normal range of motion. He exhibits no edema and no tenderness.  Neurological: He is alert and oriented to person, place, and time.  Skin: Skin is warm and dry.       Multiple sacral/buttocks ulcers without ttp, pus drainage. No warmth  Psychiatric: He has a normal mood and affect.    ED Course  Procedures (including critical care time)  Labs Reviewed  CBC WITH DIFFERENTIAL - Abnormal; Notable for the following:    RBC 3.38 (*)     Hemoglobin 10.9 (*)     HCT 32.0 (*)     Neutrophils Relative 81 (*)     Lymphocytes Relative 8 (*)     All other components within normal limits  COMPREHENSIVE METABOLIC PANEL - Abnormal; Notable for the following:    Sodium 130 (*)     Chloride 95 (*)     Glucose, Bld 114 (*)     GFR calc non Af Amer 88 (*)     All other components within normal limits  URINALYSIS, ROUTINE W REFLEX MICROSCOPIC - Abnormal; Notable for the following:    Color, Urine AMBER (*)  BIOCHEMICALS MAY BE AFFECTED BY COLOR   APPearance CLOUDY (*)     Hgb urine dipstick LARGE (*)     Ketones, ur 15 (*)     Protein, ur 30 (*)     Nitrite  POSITIVE (*)     Leukocytes, UA LARGE (*)     All  other components within normal limits  URINE MICROSCOPIC-ADD ON - Abnormal; Notable for the following:    Bacteria, UA FEW (*)     All other components within normal limits  URINE CULTURE  PROTIME-INR   Dg Chest 2 View  12/24/2011  *RADIOLOGY REPORT*  Clinical Data: Fever and weakness.  CHEST - 2 VIEW  Comparison: CT chest 07/30/2011 and chest radiograph 07/29/2011.  Findings: Trachea is midline.  Heart size stable.  Biapical pleural parenchymal scarring.  Probable mild bibasilar scarring.  Lungs are otherwise clear.  No pleural fluid.  Lower thoracic compression fracture is unchanged.  IMPRESSION: Bibasilar scarring.  No acute findings.  Original Report Authenticated By: Reyes Ivan, M.D.    1. UTI (lower urinary tract infection)   2. Fever   3. Weakness generalized     MDM  Fever to 103, generalized weakness. Doubt Pyelonephritis. Borderline blood pressure. Pt continues to have generalized weakness despite fever resolution s/p tylenol prior to arrival. Discussed admission with family. D/W [facility] hospitalist Dr. Lovell Sheehan. Admit to Team 10.        Forbes Cellar, MD 12/24/11 2132

## 2011-12-24 NOTE — ED Notes (Signed)
Patient has pressure sore in the sacrum area; non adherent dressing and abd applied.  Patient also has several small open areas on his penis.  Cleaned and left open to air.

## 2011-12-24 NOTE — ED Notes (Signed)
Returned from x ray. Caregiver at the bedside

## 2011-12-24 NOTE — ED Notes (Signed)
[facility] here for transport now. 

## 2011-12-25 ENCOUNTER — Encounter (HOSPITAL_COMMUNITY): Payer: Self-pay | Admitting: Internal Medicine

## 2011-12-25 DIAGNOSIS — I82409 Acute embolism and thrombosis of unspecified deep veins of unspecified lower extremity: Secondary | ICD-10-CM | POA: Insufficient documentation

## 2011-12-25 DIAGNOSIS — R509 Fever, unspecified: Secondary | ICD-10-CM | POA: Diagnosis present

## 2011-12-25 DIAGNOSIS — N390 Urinary tract infection, site not specified: Secondary | ICD-10-CM

## 2011-12-25 DIAGNOSIS — R531 Weakness: Secondary | ICD-10-CM | POA: Diagnosis present

## 2011-12-25 LAB — CBC
Hemoglobin: 11.4 g/dL — ABNORMAL LOW (ref 13.0–17.0)
MCHC: 33.5 g/dL (ref 30.0–36.0)
MCV: 96.3 fL (ref 78.0–100.0)
RBC: 3.53 MIL/uL — ABNORMAL LOW (ref 4.22–5.81)
WBC: 9.8 10*3/uL (ref 4.0–10.5)

## 2011-12-25 LAB — BASIC METABOLIC PANEL
GFR calc Af Amer: >90 mL/min (ref >90)
GFR calc non Af Amer: >90 mL/min (ref >90)
Potassium: 3.9 mEq/L (ref 3.5–5.1)
Sodium: 133 mEq/L — ABNORMAL LOW (ref 135–145)

## 2011-12-25 MED ORDER — ALUM & MAG HYDROXIDE-SIMETH 200-200-20 MG/5ML PO SUSP
30.0000 mL | Freq: Four times a day (QID) | ORAL | Status: DC | PRN
  Filled 2011-12-25: qty 30

## 2011-12-25 MED ORDER — LEVOFLOXACIN IN D5W 500 MG/100ML IV SOLN
500.0000 mg | INTRAVENOUS | Status: DC
  Administered 2011-12-25 – 2011-12-27 (×3): 500 mg via INTRAVENOUS
  Filled 2011-12-25 (×3): qty 100

## 2011-12-25 MED ORDER — LORAZEPAM 1 MG PO TABS: 1.0000 mg | ORAL_TABLET | Freq: Every day | ORAL | Status: DC

## 2011-12-25 MED ORDER — VITAMIN C 500 MG PO TABS
500.0000 mg | ORAL_TABLET | Freq: Every day | ORAL | Status: DC
  Administered 2011-12-25 – 2012-01-05 (×12): 500 mg via ORAL
  Filled 2011-12-25 (×12): qty 1

## 2011-12-25 MED ORDER — OXYCODONE HCL 5 MG PO TABS
5.0000 mg | ORAL_TABLET | ORAL | Status: DC | PRN
  Administered 2011-12-25 – 2012-01-04 (×14): 5 mg via ORAL
  Filled 2011-12-25 (×16): qty 1

## 2011-12-25 MED ORDER — ONDANSETRON HCL 4 MG PO TABS: 4.0000 mg | ORAL_TABLET | Freq: Four times a day (QID) | ORAL | Status: DC | PRN

## 2011-12-25 MED ORDER — OMEGA-3-ACID ETHYL ESTERS 1 G PO CAPS
1.0000 g | ORAL_CAPSULE | Freq: Two times a day (BID) | ORAL | Status: DC
  Administered 2011-12-25 – 2012-01-05 (×23): 1 g via ORAL
  Filled 2011-12-25 (×24): qty 1

## 2011-12-25 MED ORDER — LEVOTHYROXINE SODIUM 88 MCG PO TABS
88.0000 ug | ORAL_TABLET | Freq: Every day | ORAL | Status: DC
  Administered 2011-12-25 – 2012-01-05 (×12): 88 ug via ORAL
  Filled 2011-12-25 (×13): qty 1

## 2011-12-25 MED ORDER — TEMAZEPAM 15 MG PO CAPS
15.0000 mg | ORAL_CAPSULE | Freq: Every day | ORAL | Status: DC
  Administered 2011-12-25: 15 mg via ORAL
  Filled 2011-12-25: qty 1

## 2011-12-25 MED ORDER — ZOLPIDEM TARTRATE 5 MG PO TABS: 5.0000 mg | ORAL_TABLET | Freq: Every evening | ORAL | Status: DC | PRN

## 2011-12-25 MED ORDER — POLYETHYLENE GLYCOL 3350 17 G PO PACK
17.0000 g | PACK | Freq: Every day | ORAL | Status: DC
  Administered 2011-12-25 – 2012-01-03 (×7): 17 g via ORAL
  Filled 2011-12-25 (×13): qty 1

## 2011-12-25 MED ORDER — MEMANTINE HCL 10 MG PO TABS
10.0000 mg | ORAL_TABLET | Freq: Two times a day (BID) | ORAL | Status: DC
  Administered 2011-12-25 – 2012-01-05 (×23): 10 mg via ORAL
  Filled 2011-12-25 (×24): qty 1

## 2011-12-25 MED ORDER — PNEUMOCOCCAL VAC POLYVALENT 25 MCG/0.5ML IJ INJ
0.5000 mL | INJECTION | INTRAMUSCULAR | Status: AC
  Administered 2011-12-26: 0.5 mL via INTRAMUSCULAR
  Filled 2011-12-25: qty 0.5

## 2011-12-25 MED ORDER — HYDROMORPHONE HCL PF 1 MG/ML IJ SOLN
0.5000 mg | INTRAMUSCULAR | Status: DC | PRN
  Administered 2011-12-26: 1 mg via INTRAVENOUS
  Administered 2011-12-27 – 2011-12-28 (×3): 0.5 mg via INTRAVENOUS
  Administered 2011-12-31 – 2012-01-03 (×11): 1 mg via INTRAVENOUS
  Administered 2012-01-04 (×3): 0.5 mg via INTRAVENOUS
  Administered 2012-01-05 (×2): 1 mg via INTRAVENOUS
  Filled 2011-12-25 (×21): qty 1

## 2011-12-25 MED ORDER — DONEPEZIL HCL 5 MG PO TABS
5.0000 mg | ORAL_TABLET | Freq: Every day | ORAL | Status: DC
  Administered 2011-12-25 – 2012-01-04 (×11): 5 mg via ORAL
  Filled 2011-12-25 (×13): qty 1

## 2011-12-25 MED ORDER — SILVER SULFADIAZINE 1 % EX CREA
1.0000 "application " | TOPICAL_CREAM | Freq: Every day | CUTANEOUS | Status: DC
  Administered 2011-12-25 – 2012-01-05 (×10): 1 via TOPICAL
  Filled 2011-12-25 (×2): qty 85

## 2011-12-25 MED ORDER — ONDANSETRON HCL 4 MG/2ML IJ SOLN
4.0000 mg | Freq: Four times a day (QID) | INTRAMUSCULAR | Status: DC | PRN
  Administered 2011-12-25: 4 mg via INTRAVENOUS
  Filled 2011-12-25: qty 2

## 2011-12-25 MED ORDER — SODIUM CHLORIDE 0.9 % IJ SOLN
3.0000 mL | Freq: Two times a day (BID) | INTRAMUSCULAR | Status: DC
  Administered 2011-12-25 – 2012-01-04 (×6): 3 mL via INTRAVENOUS

## 2011-12-25 MED ORDER — ACETAMINOPHEN 650 MG RE SUPP: 650.0000 mg | Freq: Four times a day (QID) | RECTAL | Status: DC | PRN

## 2011-12-25 MED ORDER — SODIUM CHLORIDE 0.9 % IV SOLN
INTRAVENOUS | Status: DC
  Administered 2011-12-25 – 2011-12-29 (×6): via INTRAVENOUS

## 2011-12-25 MED ORDER — WARFARIN SODIUM 7.5 MG PO TABS
7.5000 mg | ORAL_TABLET | Freq: Once | ORAL | Status: AC
  Administered 2011-12-25: 7.5 mg via ORAL
  Filled 2011-12-25: qty 1

## 2011-12-25 MED ORDER — WARFARIN - PHARMACIST DOSING INPATIENT
Freq: Every day | Status: DC
  Administered 2011-12-29: 18:00:00

## 2011-12-25 MED ORDER — ENOXAPARIN SODIUM 60 MG/0.6ML ~~LOC~~ SOLN
1.0000 mg/kg | Freq: Two times a day (BID) | SUBCUTANEOUS | Status: DC
  Administered 2011-12-25 (×3): 50 mg via SUBCUTANEOUS
  Filled 2011-12-25 (×5): qty 0.6

## 2011-12-25 MED ORDER — ACETAMINOPHEN 325 MG PO TABS
650.0000 mg | ORAL_TABLET | Freq: Four times a day (QID) | ORAL | Status: DC | PRN
  Filled 2011-12-25: qty 2

## 2011-12-25 NOTE — H&P (Signed)
DATE OF ADMISSION:  12/25/2011  PCP:   Allean Found, MD   Chief Complaint:  Fever and Weakness   HPI: [name] is an 78 y.o. male who was seen at the the [facility] ED due to complaints of fevers to 101 at home and generalized weakness over the past 24 hours.  He denies any cough, chest pain or SOB, nausea or vomiting or diarrhea or ABD pain.  He was evaluated in the ED and found to have fever and hypotension, and his laboratory studies revealed a positive UA, and a low sodium level of 130.  He was transferred to [facility] for medical  admission.    Past Medical History  Diagnosis Date  . CHF (congestive heart failure)   . Arthritis   . Pacemaker   . Incontinence   . Pneumonia 2011; 07/29/11  . Neuropathy   . Heart murmur   . Dysrhythmia     paced  . Hypothyroidism   . Restless leg syndrome   . Blood transfusion   . Anemia   . Lower GI bleeding 2011    "from ATB I wasn't suppose to have had ordered"  . Dementia 07/29/11     not observed by this RN  . Atrial fibrillation     Past Surgical History  Procedure Date  . Insert / replace / remove pacemaker ~ 2006    initial placement  . Insert / replace / remove pacemaker 05/2009  . Knee arthroscopy ~ 2000's    right  . Back surgery     "as a kid"  . Inguinal hernia repair 1960's    left  . Inguinal hernia repair 1973    right    Medications:  HOME MEDS: Prior to Admission medications   Medication Sig Start Date End Date Taking? Authorizing Provider  Calcium Carbonate-Vitamin D (CALCIUM 600/VITAMIN D PO) Take 1 tablet by mouth 2 (two) times daily.    Yes Historical Provider, MD  donepezil (ARICEPT) 5 MG tablet Take 5 mg by mouth at bedtime.    Yes Historical Provider, MD  HYDROcodone-acetaminophen (LORTAB) 10-500 MG per tablet Take 1 tablet by mouth every 6 (six) hours as needed. For pain.   Yes Historical Provider, MD  levothyroxine (SYNTHROID, LEVOTHROID) 88 MCG tablet Take 88 mcg by mouth every morning.    Yes Historical Provider, MD  LORazepam (ATIVAN) 0.5 MG tablet Take 1 mg by mouth at bedtime. For anxiety   Yes Historical Provider, MD  memantine (NAMENDA) 10 MG tablet Take 10 mg by mouth 2 (two) times daily.   Yes Historical Provider, MD  mirabegron ER (MYRBETRIQ) 25 MG TB24 Take 25 mg by mouth daily.   Yes Historical Provider, MD  omega-3 acid ethyl esters (LOVAZA) 1 G capsule Take 1 g by mouth 2 (two) times daily.    Yes Historical Provider, MD  Polyethyl Glycol-Propyl Glycol (SYSTANE) 0.4-0.3 % SOLN Apply to eye.   Yes Historical Provider, MD  silver sulfADIAZINE (SILVADENE) 1 % cream Apply 1 application topically daily. Applies to rear   Yes Historical Provider, MD  vitamin C (ASCORBIC ACID) 500 MG tablet Take 500 mg by mouth daily.    Yes Historical Provider, MD  warfarin (COUMADIN) 5 MG tablet Take 5 mg by mouth daily. Takes 1 tablet daily on mondays and wednesdays   Yes Historical Provider, MD  cyclobenzaprine (FLEXERIL) 10 MG tablet Take 10 mg by mouth at bedtime.    Historical Provider, MD  fentaNYL (DURAGESIC - DOSED  MCG/HR) 25 MCG/HR Place 1 patch onto the skin every 3 (three) days.    Historical Provider, MD  spironolactone (ALDACTONE) 25 MG tablet Take 12.5 mg by mouth daily.    Historical Provider, MD  temazepam (RESTORIL) 15 MG capsule Take 15 mg by mouth at bedtime.    Historical Provider, MD  warfarin (COUMADIN) 1 MG tablet Take 6 mg by mouth See admin instructions. Takes 6mg  tablet daily except on mondays and wednesdays    Historical Provider, MD    Allergies:  Allergies  Allergen Reactions  . Penicillins Hives  . Uroxatral (Alfuzosin Hydrochloride) Other (See Comments)    hypotension    Social History:   reports that he quit smoking about 40 years ago. His smoking use included Cigarettes. He has a 30 pack-year smoking history. He has never used smokeless tobacco. He reports that he does not drink alcohol or use illicit drugs.  Family History:  Negative per  Patient  Review of Systems:  The patient denies anorexia, fever, weight loss, vision loss, decreased hearing, hoarseness, chest pain, syncope, dyspnea on exertion, peripheral edema, balance deficits, hemoptysis, abdominal pain, melena, hematochezia, severe indigestion/heartburn, hematuria, incontinence, genital sores, muscle weakness, suspicious skin lesions, transient blindness, difficulty walking, depression, unusual weight change, abnormal bleeding, enlarged lymph nodes, angioedema, and breast masses.   Physical Exam:  GEN:  Pleasant  78 year old elderly Caucasian male examined  and in no acute distress; cooperative with exam Filed Vitals:   12/24/11 2058 12/24/11 2220 12/24/11 2323 12/24/11 2339  BP: 114/53   105/49  Pulse: 82   80  Temp: 99.1 F (37.3 C) 98.9 F (37.2 C)  99 F (37.2 C)  TempSrc:    Oral  Resp: 16   20  Height:    5\' 6"  (1.676 m)  Weight:   51.1 kg (112 lb 10.5 oz)   SpO2: 96%   97%   Blood pressure 105/49, pulse 80, temperature 99 F (37.2 C), temperature source Oral, resp. rate 20, height 5\' 6"  (1.676 m), weight 51.1 kg (112 lb 10.5 oz), SpO2 97.00%. PSYCH: He is alert and oriented x4; does not appear anxious does not appear depressed; affect is normal HEENT: Normocephalic and Atraumatic, Mucous membranes pink; PERRLA; EOM intact; Fundi:  Benign;  No scleral icterus, Nares: Patent, Oropharynx: Clear, Fair Dentition, Neck:  FROM, no cervical lymphadenopathy nor thyromegaly or carotid bruit; no JVD; Breasts:: Not examined CHEST WALL: No tenderness CHEST: Normal respiration, clear to auscultation bilaterally HEART: Regular rate and rhythm; no murmurs rubs or gallops BACK: No kyphosis or scoliosis; no CVA tenderness ABDOMEN: Positive Bowel Sounds, soft non-tender; no masses, no organomegaly. Rectal Exam: Not done EXTREMITIES: No bone or joint deformity; age-appropriate arthropathy of the hands and knees; no cyanosis, clubbing or edema; no ulcerations. Genitalia:  not examined PULSES: 2+ and symmetric SKIN: Normal hydration no rash or ulceration CNS: Cranial nerves 2-12 grossly intact no focal neurologic deficit   Labs & Imaging Results for orders placed during the hospital encounter of 12/24/11 (from the past 48 hour(s))  CBC WITH DIFFERENTIAL     Status: Abnormal   Collection Time   12/24/11  5:08 PM      Component Value Range Comment   WBC 8.4  4.0 - 10.5 K/uL    RBC 3.38 (*) 4.22 - 5.81 MIL/uL    Hemoglobin 10.9 (*) 13.0 - 17.0 g/dL    HCT 16.1 (*) 09.6 - 52.0 %    MCV 94.7  78.0 - 100.0  fL    MCH 32.2  26.0 - 34.0 pg    MCHC 34.1  30.0 - 36.0 g/dL    RDW 81.1  91.4 - 78.2 %    Platelets 170  150 - 400 K/uL    Neutrophils Relative 81 (*) 43 - 77 %    Neutro Abs 6.8  1.7 - 7.7 K/uL    Lymphocytes Relative 8 (*) 12 - 46 %    Lymphs Abs 0.7  0.7 - 4.0 K/uL    Monocytes Relative 10  3 - 12 %    Monocytes Absolute 0.9  0.1 - 1.0 K/uL    Eosinophils Relative 1  0 - 5 %    Eosinophils Absolute 0.0  0.0 - 0.7 K/uL    Basophils Relative 0  0 - 1 %    Basophils Absolute 0.0  0.0 - 0.1 K/uL   COMPREHENSIVE METABOLIC PANEL     Status: Abnormal   Collection Time   12/24/11  5:08 PM      Component Value Range Comment   Sodium 130 (*) 135 - 145 mEq/L    Potassium 4.1  3.5 - 5.1 mEq/L    Chloride 95 (*) 96 - 112 mEq/L    CO2 27  19 - 32 mEq/L    Glucose, Bld 114 (*) 70 - 99 mg/dL    BUN 20  6 - 23 mg/dL    Creatinine, Ser 9.56  0.50 - 1.35 mg/dL    Calcium 9.6  8.4 - 21.3 mg/dL    Total Protein 7.2  6.0 - 8.3 g/dL    Albumin 3.6  3.5 - 5.2 g/dL    AST 26  0 - 37 U/L    ALT 13  0 - 53 U/L    Alkaline Phosphatase 73  39 - 117 U/L    Total Bilirubin 0.7  0.3 - 1.2 mg/dL    GFR calc non Af Amer 88 (*) >90 mL/min    GFR calc Af Amer >90  >90 mL/min   PROTIME-INR     Status: Abnormal   Collection Time   12/24/11  5:08 PM      Component Value Range Comment   Prothrombin Time 20.4 (*) 11.6 - 15.2 seconds    INR 1.71 (*) 0.00 - 1.49    URINALYSIS, ROUTINE W REFLEX MICROSCOPIC     Status: Abnormal   Collection Time   12/24/11  6:03 PM      Component Value Range Comment   Color, Urine AMBER (*) YELLOW BIOCHEMICALS MAY BE AFFECTED BY COLOR   APPearance CLOUDY (*) CLEAR    Specific Gravity, Urine 1.027  1.005 - 1.030    pH 5.5  5.0 - 8.0    Glucose, UA NEGATIVE  NEGATIVE mg/dL    Hgb urine dipstick LARGE (*) NEGATIVE    Bilirubin Urine NEGATIVE  NEGATIVE    Ketones, ur 15 (*) NEGATIVE mg/dL    Protein, ur 30 (*) NEGATIVE mg/dL    Urobilinogen, UA 0.2  0.0 - 1.0 mg/dL    Nitrite POSITIVE (*) NEGATIVE    Leukocytes, UA LARGE (*) NEGATIVE   URINE MICROSCOPIC-ADD ON     Status: Abnormal   Collection Time   12/24/11  6:03 PM      Component Value Range Comment   Squamous Epithelial / LPF RARE  RARE    WBC, UA 21-50  <3 WBC/hpf    RBC / HPF 11-20  <3 RBC/hpf    Bacteria, UA  FEW (*) RARE    Dg Chest 2 View  12/24/2011  *RADIOLOGY REPORT*  Clinical Data: Fever and weakness.  CHEST - 2 VIEW  Comparison: CT chest 07/30/2011 and chest radiograph 07/29/2011.  Findings: Trachea is midline.  Heart size stable.  Biapical pleural parenchymal scarring.  Probable mild bibasilar scarring.  Lungs are otherwise clear.  No pleural fluid.  Lower thoracic compression fracture is unchanged.  IMPRESSION: Bibasilar scarring.  No acute findings.  Original Report Authenticated By: Reyes Ivan, M.D.      Assessment/Plan: Present on Admission:  .Fever .Weakness generalized .UTI (lower urinary tract infection) .HYPOTHYROIDISM .Atrial fibrillation .DEMENTIA Hyponatremia   Plan:    Admit to [facility] Bed Urine C+S sent, and IV levaquin started IVFs ordered and monitor sodium levels, check Urine electrolytes.   Check TSH level Reconcile Home medications.   Pharmacy adjustment of coumadin due to subtherapeutic level Other plans as per orders.    CODE STATUS:      FULL CODE        Dorsie Burich C 12/25/2011, 3:04 AM

## 2011-12-25 NOTE — Progress Notes (Signed)
Subjective: Patient denies dyspnea. He is feeling better.  Objective: Filed Vitals:   12/24/11 2220 12/24/11 2323 12/24/11 2339 12/25/11 0423  BP:   105/49 113/55  Pulse:   80 78  Temp: 98.9 F (37.2 C)  99 F (37.2 C) 99.7 F (37.6 C)  TempSrc:   Oral Oral  Resp:   20 28  Height:   5\' 6"  (1.676 m)   Weight:  51.1 kg (112 lb 10.5 oz)    SpO2:   97% 97%   Weight change:    General: Alert, awake, oriented x3, in no acute distress.  HEENT: No bruits, no goiter.  Heart: Regular rate and rhythm, without murmurs, rubs, gallops.  Lungs: CTA, bilateral air movement.  Abdomen: Soft, nontender, nondistended, positive bowel sounds.  Neuro: Grossly intact, nonfocal. Extremities no edema.   Lab Results:  Basename 12/25/11 0600 12/24/11 1708  NA 133* 130*  K 3.9 4.1  CL 99 95*  CO2 20 27  GLUCOSE 91 114*  BUN 16 20  CREATININE 0.50 0.60  CALCIUM 8.8 9.6  MG -- --  PHOS -- --    Basename 12/24/11 1708  AST 26  ALT 13  ALKPHOS 73  BILITOT 0.7  PROT 7.2  ALBUMIN 3.6    Basename 12/25/11 0600 12/24/11 1708  WBC 9.8 8.4  NEUTROABS -- 6.8  HGB 11.4* 10.9*  HCT 34.0* 32.0*  MCV 96.3 94.7  PLT 145* 170    Studies/Results: Dg Chest 2 View  12/24/2011  *RADIOLOGY REPORT*  Clinical Data: Fever and weakness.  CHEST - 2 VIEW  Comparison: CT chest 07/30/2011 and chest radiograph 07/29/2011.  Findings: Trachea is midline.  Heart size stable.  Biapical pleural parenchymal scarring.  Probable mild bibasilar scarring.  Lungs are otherwise clear.  No pleural fluid.  Lower thoracic compression fracture is unchanged.  IMPRESSION: Bibasilar scarring.  No acute findings.  Original Report Authenticated By: Reyes Ivan, M.D.    Medications: I have reviewed the patient's current medications.   1-Weakness generalized, failure to thrive: probably related to infection. Continue with treatment underline infection.   2-UTI (lower urinary tract infection): Ua with positive nitrates,  wbc 11-20, febrile. Continue with Levaquin day 2.  3-HYPOTHYROIDISM: Resume synthroid.  4-Atrial fibrillation: Continue with coumadin per pharmacy.  5-DEMENTIA : continue with temazepam, aricept, namenda.  6-Hyponatremia: Improved with IV fluids.  7-Skin lower back buttock ulcer stage 1: wound care consult.     LOS: 1 day   REGALADO,BELKYS M.D.  [facility] 12/25/2011, 8:26 AM

## 2011-12-25 NOTE — Progress Notes (Signed)
ANTICOAGULATION CONSULT NOTE - Initial Consult  Pharmacy Consult for Coumadin Indication: atrial fibrillation  Allergies  Allergen Reactions  . Penicillins Hives  . Uroxatral (Alfuzosin Hydrochloride) Other (See Comments)    hypotension    Patient Measurements: Height: 5\' 6"  (167.6 cm) Weight: 112 lb 10.5 oz (51.1 kg) IBW/kg (Calculated) : 63.8   Vital Signs: Temp: 99 F (37.2 C) (07/12 2339) Temp src: Oral (07/12 2339) BP: 105/49 mmHg (07/12 2339) Pulse Rate: 80  (07/12 2339)  Labs:  Basename 12/24/11 1708  HGB 10.9*  HCT 32.0*  PLT 170  APTT --  LABPROT 20.4*  INR 1.71*  HEPARINUNFRC --  CREATININE 0.60  CKTOTAL --  CKMB --  TROPONINI --    Estimated Creatinine Clearance: 47 ml/min (by C-G formula based on Cr of 0.6).   Medical History: Past Medical History  Diagnosis Date  . CHF (congestive heart failure)   . Arthritis   . Pacemaker   . Incontinence   . Pneumonia 2011; 07/29/11  . Neuropathy   . Heart murmur   . Dysrhythmia     paced  . Hypothyroidism   . Restless leg syndrome   . Blood transfusion   . Anemia   . Lower GI bleeding 2011    "from ATB I wasn't suppose to have had ordered"  . Dementia 07/29/11     not observed by this RN  . Atrial fibrillation     Medications:  Prescriptions prior to admission  Medication Sig Dispense Refill  . Calcium Carbonate-Vitamin D (CALCIUM 600/VITAMIN D PO) Take 1 tablet by mouth 2 (two) times daily.       Marland Kitchen donepezil (ARICEPT) 5 MG tablet Take 5 mg by mouth at bedtime.       Marland Kitchen HYDROcodone-acetaminophen (LORTAB) 10-500 MG per tablet Take 1 tablet by mouth every 6 (six) hours as needed. For pain.      Marland Kitchen levothyroxine (SYNTHROID, LEVOTHROID) 88 MCG tablet Take 88 mcg by mouth every morning.      Marland Kitchen LORazepam (ATIVAN) 0.5 MG tablet Take 1 mg by mouth at bedtime. For anxiety      . memantine (NAMENDA) 10 MG tablet Take 10 mg by mouth 2 (two) times daily.      . mirabegron ER (MYRBETRIQ) 25 MG TB24 Take 25 mg  by mouth daily.      Marland Kitchen omega-3 acid ethyl esters (LOVAZA) 1 G capsule Take 1 g by mouth 2 (two) times daily.       Bertram Gala Glycol-Propyl Glycol (SYSTANE) 0.4-0.3 % SOLN Apply to eye.      . silver sulfADIAZINE (SILVADENE) 1 % cream Apply 1 application topically daily. Applies to rear      . vitamin C (ASCORBIC ACID) 500 MG tablet Take 500 mg by mouth daily.       Marland Kitchen warfarin (COUMADIN) 5 MG tablet Take 5 mg by mouth daily. Takes 1 tablet daily on mondays and wednesdays      . cyclobenzaprine (FLEXERIL) 10 MG tablet Take 10 mg by mouth at bedtime.      . fentaNYL (DURAGESIC - DOSED MCG/HR) 25 MCG/HR Place 1 patch onto the skin every 3 (three) days.      Marland Kitchen spironolactone (ALDACTONE) 25 MG tablet Take 12.5 mg by mouth daily.      . temazepam (RESTORIL) 15 MG capsule Take 15 mg by mouth at bedtime.      Marland Kitchen warfarin (COUMADIN) 1 MG tablet Take 6 mg by mouth See admin instructions. Takes  6mg  tablet daily except on mondays and wednesdays        Assessment: 78 y.o. male presents with fever and weakness. On coumadin PTA for afib. Baseline INR 1.71. Noted MD started lovenox bridge - 1mg /kg q12h - appropriate for renal function. Pt took coumadin dose 7/12 p.m. Noted pt also on levaquin which should potentiate effects of coumadin  Goal of Therapy:  INR 2-3 Monitor platelets by anticoagulation protocol: Yes   Plan:  1. Coumadin 7.5 mg po tonight 2. Daily INR 3. F/u INR and will plan to d/c lovenox when INR therapeutic  Christoper Fabian, PharmD, BCPS Clinical pharmacist, pager [phone] 12/25/2011,1:02 AM

## 2011-12-26 ENCOUNTER — Inpatient Hospital Stay (HOSPITAL_COMMUNITY): Payer: Medicare Other

## 2011-12-26 DIAGNOSIS — R109 Unspecified abdominal pain: Secondary | ICD-10-CM | POA: Diagnosis present

## 2011-12-26 DIAGNOSIS — I4891 Unspecified atrial fibrillation: Secondary | ICD-10-CM

## 2011-12-26 DIAGNOSIS — R509 Fever, unspecified: Secondary | ICD-10-CM

## 2011-12-26 DIAGNOSIS — R5383 Other fatigue: Secondary | ICD-10-CM

## 2011-12-26 MED ORDER — WHITE PETROLATUM GEL
Status: AC
  Administered 2011-12-26
  Filled 2011-12-26: qty 5

## 2011-12-26 MED ORDER — WARFARIN SODIUM 2.5 MG PO TABS
2.5000 mg | ORAL_TABLET | Freq: Once | ORAL | Status: AC
  Administered 2011-12-26: 2.5 mg via ORAL
  Filled 2011-12-26: qty 1

## 2011-12-26 MED ORDER — OXYBUTYNIN CHLORIDE 5 MG PO TABS
2.5000 mg | ORAL_TABLET | Freq: Three times a day (TID) | ORAL | Status: DC | PRN
  Administered 2011-12-26 – 2011-12-28 (×4): 2.5 mg via ORAL
  Filled 2011-12-26 (×7): qty 0.5

## 2011-12-26 MED ORDER — LORAZEPAM 1 MG PO TABS
1.0000 mg | ORAL_TABLET | Freq: Every day | ORAL | Status: DC
  Administered 2011-12-26 – 2012-01-04 (×10): 1 mg via ORAL
  Filled 2011-12-26 (×10): qty 1

## 2011-12-26 NOTE — Progress Notes (Addendum)
Subjective: Patient relates suprapubic pain, cramps. Had BM today.  Has decrease appetite. Not eating a lot.   Objective: Filed Vitals:   12/25/11 0423 12/25/11 1500 12/25/11 2049 12/26/11 0453  BP: 113/55 105/57 102/59 100/62  Pulse: 78 67 80 82  Temp: 99.7 F (37.6 C) 98.4 F (36.9 C) 99.8 F (37.7 C) 98.4 F (36.9 C)  TempSrc: Oral Oral Oral Oral  Resp: 28 18 17 17   Height:      Weight:    53.8 kg (118 lb 9.7 oz)  SpO2: 97% 98% 97% 99%   Weight change: -2.9 kg (-6 lb 6.3 oz)   General: Alert, awake, oriented x3, in no acute distress.  HEENT: No bruits, no goiter.  Heart: Regular rate and rhythm, without murmurs, rubs, gallops.  Lungs: CTA, bilateral air movement.  Abdomen: Soft, Supra- pubic tender, nondistended, positive bowel sounds.  Neuro: Grossly intact, nonfocal. Extremities; no edema.   Lab Results:  Basename 12/25/11 0600 12/24/11 1708  NA 133* 130*  K 3.9 4.1  CL 99 95*  CO2 20 27  GLUCOSE 91 114*  BUN 16 20  CREATININE 0.50 0.60  CALCIUM 8.8 9.6  MG -- --  PHOS -- --    Basename 12/24/11 1708  AST 26  ALT 13  ALKPHOS 73  BILITOT 0.7  PROT 7.2  ALBUMIN 3.6    Basename 12/25/11 0600 12/24/11 1708  WBC 9.8 8.4  NEUTROABS -- 6.8  HGB 11.4* 10.9*  HCT 34.0* 32.0*  MCV 96.3 94.7  PLT 145* 170    Micro Results: Recent Results (from the past 240 hour(s))  URINE CULTURE     Status: Normal (Preliminary result)   Collection Time   12/24/11  6:43 PM      Component Value Range Status Comment   Specimen Description URINE, CATHETERIZED   Final    Special Requests NONE   Final    Culture  Setup Time 12/25/2011 01:56   Final    Colony Count >=100,000 COLONIES/ML   Final    Culture     Final    Value: STAPHYLOCOCCUS AUREUS     Note: RIFAMPIN AND GENTAMICIN SHOULD NOT BE USED AS SINGLE DRUGS FOR TREATMENT OF STAPH INFECTIONS.   Report Status PENDING   Incomplete     Studies/Results: Dg Chest 2 View  12/24/2011  *RADIOLOGY REPORT*  Clinical  Data: Fever and weakness.  CHEST - 2 VIEW  Comparison: CT chest 07/30/2011 and chest radiograph 07/29/2011.  Findings: Trachea is midline.  Heart size stable.  Biapical pleural parenchymal scarring.  Probable mild bibasilar scarring.  Lungs are otherwise clear.  No pleural fluid.  Lower thoracic compression fracture is unchanged.  IMPRESSION: Bibasilar scarring.  No acute findings.  Original Report Authenticated By: Reyes Ivan, M.D.    Medications: I have reviewed the patient's current medications.  1-Weakness generalized, failure to thrive: probably related to infection. Continue with treatment underline infection.   2-UTI (lower urinary tract infection): Ua with positive nitrates, wbc 11-20, febrile. Continue with Levaquin day 3.  3-HYPOTHYROIDISM: Continue with  synthroid.  4-Atrial fibrillation: Continue with coumadin per pharmacy.  5-DEMENTIA : continue with temazepam, aricept, namenda. I will restart ativan, per wife patient has been on both ativan and temazepam without problems with sedation.  6-Hyponatremia: Improved with IV fluids.  7-Skin lower back buttock ulcer stage 1: wound care consulted.  8-Suprapubic: Probably related to bladder spasm. Check bladder US to evaluate for urine retention. I will check KUB, patient with  history of frequent bowel obstruction. I will start Oxybutynin PRN for spasm.     LOS: 2 days   Mahki Spikes M.D.  [facility] 12/26/2011, 4:20 PM  I spoke with wife today and gave update of patient condition and plan of care.

## 2011-12-26 NOTE — Progress Notes (Addendum)
ANTICOAGULATION CONSULT NOTE - Follow Up Consult  Pharmacy Consult for Warfarin  Indication: atrial fibrillation  Allergies  Allergen Reactions  . Penicillins Hives  . Uroxatral (Alfuzosin Hydrochloride) Other (See Comments)    hypotension    Patient Measurements: Height: 5\' 6"  (167.6 cm) Weight: 118 lb 9.7 oz (53.8 kg) IBW/kg (Calculated) : 63.8   Vital Signs: Temp: 98.4 F (36.9 C) (07/14 0453) Temp src: Oral (07/14 0453) BP: 100/62 mmHg (07/14 0453) Pulse Rate: 82  (07/14 0453)  Labs:  Basename 12/26/11 0520 12/25/11 0600 12/24/11 1708  HGB -- 11.4* 10.9*  HCT -- 34.0* 32.0*  PLT -- 145* 170  APTT -- -- --  LABPROT 27.0* -- 20.4*  INR 2.45* -- 1.71*  HEPARINUNFRC -- -- --  CREATININE -- 0.50 0.60  CKTOTAL -- -- --  CKMB -- -- --  TROPONINI -- -- --    Estimated Creatinine Clearance: 49.5 ml/min (by C-G formula based on Cr of 0.5).   Medications:  Scheduled:    . sodium chloride   Intravenous STAT  . donepezil  5 mg Oral QHS  . levofloxacin (LEVAQUIN) IV  500 mg Intravenous Q24H  . levothyroxine  88 mcg Oral QAC breakfast  . memantine  10 mg Oral BID  . omega-3 acid ethyl esters  1 g Oral BID  . pneumococcal 23 valent vaccine  0.5 mL Intramuscular Tomorrow-1000  . polyethylene glycol  17 g Oral Daily  . silver sulfADIAZINE  1 application Topical Daily  . sodium chloride  3 mL Intravenous Q12H  . temazepam  15 mg Oral QHS  . vitamin C  500 mg Oral Daily  . warfarin  7.5 mg Oral ONCE-1800  . Warfarin - Pharmacist Dosing Inpatient   Does not apply q1800  . DISCONTD: enoxaparin (LOVENOX) injection  1 mg/kg Subcutaneous Q12H    Assessment: Pt is a 78 yo M who presented with fever and weakness. Pt on warfarin PTA for afib. The lovenox bridge was d/c today due to therapeutic INR. INR today is 2.45(increase from 1.71); large increase possibly due to levaquin interaction. Will adjust dose of warfarin to account for marked increase INR. Noleeding reported.    Goal of Therapy:  INR 2-3 Monitor platelets by anticoagulation protocol: Yes   Plan:  1. Warfarin 2.5mg  at 1800 today 2. Daily PT/INR   Abran Duke 12/26/2011,9:57 AM  Noah Delaine, RPh Clinical Pharmacist [phone] 12/26/2011 10:19

## 2011-12-26 NOTE — Evaluation (Signed)
Physical Therapy Evaluation Patient Details Name: [name] MRN: [id number] DOB: December 10, 1922 Today's Date: 12/26/2011 Time: 4098-1191 PT Time Calculation (min): 34 min  PT Assessment / Plan / Recommendation Clinical Impression  Patient is an 78 yo male admitted with fever, weakness, and UTI.  Patient with history of dementia.  Today patient required +2 assist for all mobility.  Patient with decreased balance as well, leaning to right side.  Patient has 24 hour CNA at home who provided assistance with mobility and ADL's.  Recommend patient return home with 24 hour assistance and HHPT at discharge.  Will follow acutely for mobility/gait training.    PT Assessment  Patient needs continued PT services    Follow Up Recommendations  Home health PT;Supervision/Assistance - 24 hour    Barriers to Discharge        Equipment Recommendations  None recommended by PT    Recommendations for Other Services     Frequency Min 3X/week    Precautions / Restrictions Precautions Precautions: Fall Restrictions Weight Bearing Restrictions: No         Mobility  Bed Mobility Bed Mobility: Supine to Sit;Sitting - Scoot to Edge of Bed Supine to Sit: 1: +2 Total assist;With rails;HOB elevated Supine to Sit: Patient Percentage: 50% Sitting - Scoot to Edge of Bed: 2: Max assist Details for Bed Mobility Assistance: Verbal cues for technique and safety.  Patient required assist to raise trunk from bed, and to bring RLE off bed.  Used bed pad to assist patient to scoot to EOB Transfers Transfers: Sit to Stand;Stand to Sit;Stand Pivot Transfers Sit to Stand: 1: +2 Total assist;With upper extremity assist;From bed;From chair/3-in-1;With armrests Sit to Stand: Patient Percentage: 40% Stand to Sit: 1: +2 Total assist;With upper extremity assist;With armrests;To chair/3-in-1 Stand to Sit: Patient Percentage: 50% Stand Pivot Transfers: 1: +2 Total assist Stand Pivot Transfers: Patient Percentage:  50% Details for Transfer Assistance: Patient required +2 assist to initiate sit to stand.  Once standing, patient leaning significantly to right and posteriorly.  Required +2 assist to maintain upright position.  Patient able to take short steps to move to chair - total assist to maneuver  RW.    Exercises     PT Diagnosis: Difficulty walking;Generalized weakness  PT Problem List: Decreased strength;Decreased activity tolerance;Decreased balance;Decreased mobility;Decreased cognition;Decreased knowledge of use of DME PT Treatment Interventions: DME instruction;Gait training;Functional mobility training;Therapeutic activities;Balance training;Cognitive remediation;Patient/family education   PT Goals Acute Rehab PT Goals PT Goal Formulation: With patient Time For Goal Achievement: 01/02/12 Potential to Achieve Goals: Fair Pt will go Supine/Side to Sit: with min assist;with HOB 0 degrees PT Goal: Supine/Side to Sit - Progress: Goal set today Pt will go Sit to Supine/Side: with min assist;with HOB 0 degrees PT Goal: Sit to Supine/Side - Progress: Goal set today Pt will go Sit to Stand: with min assist;with upper extremity assist PT Goal: Sit to Stand - Progress: Goal set today Pt will go Stand to Sit: with min assist;with upper extremity assist PT Goal: Stand to Sit - Progress: Goal set today Pt will Transfer Bed to Chair/Chair to Bed: with min assist PT Transfer Goal: Bed to Chair/Chair to Bed - Progress: Goal set today Pt will Ambulate: 16 - 50 feet;with min assist;with rolling walker PT Goal: Ambulate - Progress: Goal set today  Visit Information  Last PT Received On: 12/26/11 Assistance Needed: +2    Subjective Data  Subjective: I'm so weak Patient Stated Goal: To be able to go home  Prior Functioning  Home Living Lives With: Spouse Available Help at Discharge: Family;Personal care attendant;Available 24 hours/day Type of Home: House Home Access: Stairs to enter [employer] of Steps: 2 Home Layout: One level Bathroom Shower/Tub: Health visitor: Standard Bathroom Accessibility: Yes How Accessible: Accessible via walker Home Adaptive Equipment: Walker - rolling;Shower chair without back;Wheelchair - manual Prior Function Level of Independence: Needs assistance Needs Assistance: Bathing;Dressing;Toileting;Meal Prep;Light Housekeeping;Gait;Transfers Bath: Maximal Dressing: Maximal Toileting: Moderate Meal Prep: Total Light Housekeeping: Total Gait Assistance: CNA uses gait belt and assists patient with RW Transfer Assistance: Mod assist Able to Take Stairs?: Yes Driving: No Vocation: Retired Comments: Patient and wife have 24 hour CNA assistance at home Communication Communication: No difficulties    Cognition  Overall Cognitive Status: History of cognitive impairments - further impaired Area of Impairment: Memory;Awareness of deficits Arousal/Alertness: Awake/alert Orientation Level: Disoriented to;Place;Time;Situation Behavior During Session: WFL for tasks performed Memory Deficits: Difficulty providing information regarding living situation Awareness of Deficits: Unable to state why he is in hospital    Extremity/Trunk Assessment Right Upper Extremity Assessment RUE ROM/Strength/Tone: Deficits RUE ROM/Strength/Tone Deficits: General weakness 3+/5 Left Upper Extremity Assessment LUE ROM/Strength/Tone: Deficits LUE ROM/Strength/Tone Deficits: General weakness 3+/5 Right Lower Extremity Assessment RLE ROM/Strength/Tone: Deficits RLE ROM/Strength/Tone Deficits: Weakness 3+/5.  Weaker than left LE Left Lower Extremity Assessment LLE ROM/Strength/Tone: Deficits LLE ROM/Strength/Tone Deficits: General weakness 4-/5 Trunk Assessment Trunk Assessment: Kyphotic   Balance Balance Balance Assessed: Yes Static Sitting Balance Static Sitting - Balance Support: Bilateral upper extremity supported;Feet supported Static  Sitting - Level of Assistance: 1: +1 Total assist Static Sitting - Comment/# of Minutes: Patient leaning significantly to right in sitting.  Worked on reaching midline upright Systems analyst Standing - Balance Support: Bilateral upper extremity supported Static Standing - Level of Assistance: 1: +2 Total assist Static Standing - Comment/# of Minutes: Patient required +2 assist to maintain upright position.  Patient leaning to right and posteriorly.  End of Session PT - End of Session Equipment Utilized During Treatment: Gait belt Activity Tolerance: Patient limited by fatigue Patient left: in chair;with call bell/phone within reach Nurse Communication: Mobility status  GP     Vena Austria 12/26/2011, 3:11 PM Durenda Hurt. Renaldo Fiddler, [facility] Acute Rehab Services Pager [phone]

## 2011-12-26 NOTE — Progress Notes (Signed)
Bladder scan completed and shows 330cc in the bladder, Dr. Sunnie Nielsen on the unit and notified verbal order given to insert foley, order entered in to [facility]. Foley inserted and insertion witnessed by a second Social worker. [name] [facility]

## 2011-12-27 DIAGNOSIS — R109 Unspecified abdominal pain: Secondary | ICD-10-CM

## 2011-12-27 LAB — MRSA PCR SCREENING: MRSA by PCR: POSITIVE — AB

## 2011-12-27 LAB — BASIC METABOLIC PANEL
CO2: 20 mEq/L (ref 19–32)
Chloride: 98 mEq/L (ref 96–112)
Glucose, Bld: 138 mg/dL — ABNORMAL HIGH (ref 70–99)
Potassium: 3.8 mEq/L (ref 3.5–5.1)
Sodium: 130 mEq/L — ABNORMAL LOW (ref 135–145)

## 2011-12-27 LAB — CBC
Hemoglobin: 10.8 g/dL — ABNORMAL LOW (ref 13.0–17.0)
MCH: 31.9 pg (ref 26.0–34.0)
RBC: 3.39 MIL/uL — ABNORMAL LOW (ref 4.22–5.81)
WBC: 10 10*3/uL (ref 4.0–10.5)

## 2011-12-27 LAB — PROTIME-INR: Prothrombin Time: 33.5 seconds — ABNORMAL HIGH (ref 11.6–15.2)

## 2011-12-27 MED ORDER — ACETAMINOPHEN 650 MG RE SUPP: 650.0000 mg | Freq: Four times a day (QID) | RECTAL | Status: DC | PRN

## 2011-12-27 MED ORDER — MUPIROCIN 2 % EX OINT
1.0000 "application " | TOPICAL_OINTMENT | Freq: Two times a day (BID) | CUTANEOUS | Status: AC
  Administered 2011-12-27 – 2011-12-31 (×10): 1 via NASAL
  Filled 2011-12-27: qty 22

## 2011-12-27 MED ORDER — VANCOMYCIN HCL IN DEXTROSE 1-5 GM/200ML-% IV SOLN
1000.0000 mg | INTRAVENOUS | Status: DC
  Administered 2011-12-27 – 2011-12-30 (×4): 1000 mg via INTRAVENOUS
  Filled 2011-12-27 (×4): qty 200

## 2011-12-27 MED ORDER — ACETAMINOPHEN 325 MG PO TABS
650.0000 mg | ORAL_TABLET | Freq: Four times a day (QID) | ORAL | Status: DC | PRN
  Filled 2011-12-27: qty 2

## 2011-12-27 MED ORDER — CHLORHEXIDINE GLUCONATE CLOTH 2 % EX PADS
6.0000 | MEDICATED_PAD | Freq: Every day | CUTANEOUS | Status: AC
  Administered 2011-12-28 – 2012-01-01 (×5): 6 via TOPICAL

## 2011-12-27 MED ORDER — ENSURE COMPLETE PO LIQD
237.0000 mL | Freq: Two times a day (BID) | ORAL | Status: DC
  Administered 2011-12-27 – 2012-01-05 (×16): 237 mL via ORAL

## 2011-12-27 NOTE — Consult Note (Signed)
WOC consult Note Reason for Consult: Consult requested for sacrum  Wound type: Pt has had history of back surgery many years ago.  States that scar tissue over site periodically reopens into wound. Pt has multiple risk factors which can impair healing; emaciated, incontinent of stool and it is difficult to keep site from soiling, protruding sacral bone over previous scar tissue. Pressure Ulcer POA: Yes Measurement: Sacrum area stage 2,   1X1X.1cm, 100% pink and moist, no odor, minimal drainage. Upper buttock .5X.5X.1cm, 100% pink and moist, no odor, minimal drainage.  Dressing procedure/placement/frequency: Foam dressing to protect from stool and promote healing.  Silvadene has been ordered but wound bed is free from necrotic tissue and this could be D/Ced if ordering physician desires. Air overlay mattress to reduce pressure.  Optimize nutrition. Will not plan to follow further unless re-consulted.  [address], RN, MSN, [employer]  [phone]

## 2011-12-27 NOTE — Progress Notes (Signed)
Physical Therapy Treatment Patient Details Name: [name] MRN: [id number] DOB: Oct 03, 1922 Today's Date: 12/27/2011 Time: 4098-1191 PT Time Calculation (min): 22 min  PT Assessment / Plan / Recommendation Comments on Treatment Session  Pt with fever and weakness who is slowly progressing with mobility. Pt incontinent of stool on initiation of standing and stood x 3 for pericare, strenghthening in standing and transfer to chair. Pt desires to be able to walk but poor posture, limited balance and weakness all impair his ability to perform at this time. Will continue to follow to maximize mobility .     Follow Up Recommendations       Barriers to Discharge        Equipment Recommendations  None recommended by OT    Recommendations for Other Services    Frequency     Plan Discharge plan remains appropriate;Frequency remains appropriate    Precautions / Restrictions Precautions Precautions: Fall Restrictions Weight Bearing Restrictions: No   Pertinent Vitals/Pain Pain not reported end of session 5/10 prior to tx    Mobility  Bed Mobility Bed Mobility: Rolling Left;Left Sidelying to Sit Rolling Left: 4: Min assist;With rail Left Sidelying to Sit: 3: Mod assist;With rails;HOB flat Sitting - Scoot to Edge of Bed: 3: Mod assist Details for Bed Mobility Assistance: assist with pad to scoot to EOB, cueing for sequence and assist to complete Transfers Transfers: Sit to Stand;Stand to Sit;Stand Pivot Transfers Sit to Stand: 1: +2 Total assist;With upper extremity assist;From bed Sit to Stand: Patient Percentage: 50% Stand to Sit: 1: +2 Total assist;To chair/3-in-1;To bed Stand to Sit: Patient Percentage: 50% Stand Pivot Transfers: 1: +2 Total assist Stand Pivot Transfers: Patient Percentage: 50% Details for Transfer Assistance: assist at pelvis and cueing to pivot pelvis to chair. Pt required assist to position feet for all transfers and assist for anterior translation with each  transfer. did not use RW to pivot Ambulation/Gait Ambulation/Gait Assistance: Not tested (comment)    Exercises General Exercises - Lower Extremity Long Arc Quad: AAROM;AROM;Right;Left;10 reps;Seated (AAROM on right) Hip Flexion/Marching: AROM;Both;10 reps;Seated   PT Diagnosis:    PT Problem List:   PT Treatment Interventions:     PT Goals Acute Rehab PT Goals PT Goal: Supine/Side to Sit - Progress: Progressing toward goal Pt will go Sit to Stand: with max assist PT Goal: Sit to Stand - Progress: Revised due to lack of progress Pt will go Stand to Sit: with max assist PT Goal: Stand to Sit - Progress: Revised due to lack of progress Pt will Transfer Bed to Chair/Chair to Bed: with max assist PT Transfer Goal: Bed to Chair/Chair to Bed - Progress: Revised due to lack of progress Pt will Ambulate: 1 - 15 feet;with max assist PT Goal: Ambulate - Progress: Revised due to lack of progress  Visit Information  Last PT Received On: 12/27/11 Assistance Needed: +2 PT/OT Co-Evaluation/Treatment: Yes    Subjective Data  Subjective: I was walking.  I needed help to get in the wheelchair   Cognition  Overall Cognitive Status: History of cognitive impairments - at baseline Area of Impairment: Memory Arousal/Alertness: Awake/alert Orientation Level: Disoriented to;Place;Time;Situation Behavior During Session: WFL for tasks performed    Balance  Static Sitting Balance Static Sitting - Balance Support: Bilateral upper extremity supported;Feet supported Static Sitting - Level of Assistance: 3: Mod assist Static Sitting - Comment/# of Minutes: pt with left lateral lean and trunk flexion throughout EOB   End of Session PT - End of Session  Equipment Utilized During Treatment: Gait belt Activity Tolerance: Patient tolerated treatment well Patient left: in chair;with call bell/phone within reach Nurse Communication: Mobility status   GP     Delorse Lek 12/27/2011, 1:20 PM Delaney Meigs, PT [phone]

## 2011-12-27 NOTE — Progress Notes (Signed)
INITIAL ADULT NUTRITION ASSESSMENT Date: 12/27/2011   Time: 11:39 AM  Reason for Assessment: Low Braden  ASSESSMENT: Male 78 y.o.  Dx: Fever  Hx:  Past Medical History  Diagnosis Date  . CHF (congestive heart failure)   . Arthritis   . Pacemaker   . Incontinence   . Pneumonia 2011; 07/29/11  . Neuropathy   . Heart murmur   . Dysrhythmia     paced  . Hypothyroidism   . Restless leg syndrome   . Blood transfusion   . Anemia   . Lower GI bleeding 2011    "from ATB I wasn't suppose to have had ordered"  . Dementia 07/29/11     not observed by this RN  . Atrial fibrillation    Past Surgical History  Procedure Date  . Insert / replace / remove pacemaker ~ 2006    initial placement  . Insert / replace / remove pacemaker 05/2009  . Knee arthroscopy ~ 2000's    right  . Back surgery     "as a kid"  . Inguinal hernia repair 1960's    left  . Inguinal hernia repair 1973    right   Related Meds:     . donepezil  5 mg Oral QHS  . levofloxacin (LEVAQUIN) IV  500 mg Intravenous Q24H  . levothyroxine  88 mcg Oral QAC breakfast  . LORazepam  1 mg Oral QHS  . memantine  10 mg Oral BID  . omega-3 acid ethyl esters  1 g Oral BID  . pneumococcal 23 valent vaccine  0.5 mL Intramuscular Tomorrow-1000  . polyethylene glycol  17 g Oral Daily  . silver sulfADIAZINE  1 application Topical Daily  . sodium chloride  3 mL Intravenous Q12H  . vancomycin  1,000 mg Intravenous Q24H  . vitamin C  500 mg Oral Daily  . warfarin  2.5 mg Oral ONCE-1800  . Warfarin - Pharmacist Dosing Inpatient   Does not apply q1800  . white petrolatum      . DISCONTD: temazepam  15 mg Oral QHS   Ht: 5\' 6"  (167.6 cm)  Wt: 121 lb 7.6 oz (55.1 kg)  Ideal Wt: 64.5 kg % Ideal Wt: 85%  Wt Readings from Last 15 Encounters:  12/27/11 121 lb 7.6 oz (55.1 kg)  12/01/11 120 lb (54.432 kg)  07/30/11 138 lb 1.9 oz (62.65 kg)  04/16/10 138 lb 2.1 oz (62.656 kg)  Usual Wt: 138 lb % Usual Wt: 87.7%  Body  mass index is 19.61 kg/(m^2). BMI is WNL.  Food/Nutrition Related Hx: poor intake PTA with ongoing wt loss  Labs:  CMP     Component Value Date/Time   NA 130* 12/27/2011 0615   K 3.8 12/27/2011 0615   CL 98 12/27/2011 0615   CO2 20 12/27/2011 0615   GLUCOSE 138* 12/27/2011 0615   BUN 38* 12/27/2011 0615   CREATININE 0.75 12/27/2011 0615   CALCIUM 8.3* 12/27/2011 0615   PROT 7.2 12/24/2011 1708   ALBUMIN 3.6 12/24/2011 1708   AST 26 12/24/2011 1708   ALT 13 12/24/2011 1708   ALKPHOS 73 12/24/2011 1708   BILITOT 0.7 12/24/2011 1708   GFRNONAA 80* 12/27/2011 0615   GFRAA >90 12/27/2011 0615    Intake/Output Summary (Last 24 hours) at 12/27/11 1141 Last data filed at 12/27/11 0529  Gross per 24 hour  Intake 852.67 ml  Output    600 ml  Net 252.67 ml  BM on 7/14  Diet Order:  Cardiac  Supplements/Tube Feeding: none  IVF:    sodium chloride Last Rate: 50 mL/hr at 12/27/11 0805   Estimated Nutritional Needs:   Kcal: 1280 - 1500 kcal Protein:  65 - 75 grams protein Fluid:  1.5 - 1.8 liters daily  Admitted with fever and weakness x 24 hours. Pt from home. Pt with dementia. KUB negative for obstruction. Per MD, pt with "Failure to Thrive probably related to infection." Current Braden Score is 12.  Wound RN saw pt on 7/15 for stage II on sacrum.  Noted pt weighed 138 lb in February (2013). Current weight is down to 121 lb. This is a wt change of 12.3% x 5 months. Pt states that he is not eating as much as he used to. Pt unaware of weight loss when this RD discussed it with him. States his highest wt ever was 172 lb. Then he intentionally lost 20 lb and maintained 152 lb for years. Pt states that he felt "good" at 152 lb. States he last weighed 152 lb about 2 years ago. Pt refused breakfast. Per intake records, pt is eating 15 - 35% of meals. Pt meets criteria for severe malnutrition in the context of acute illness 2/2 <50% of estimated energy intake for at least 5 days and 12.3% wt loss of  usual body weight.   Pt agreeable to trying Ensure supplements. Pt states that his intake is poor because "whenever food passes through his GI tract, he has pain."  NUTRITION DIAGNOSIS: -Inadequate oral intake (NI-2.1).  Status: Ongoing  RELATED TO: dementia and acute illness  AS EVIDENCE BY: <50% of estimated energy intake for at least 5 days and 12.3% wt loss of usual body weight.   MONITORING/EVALUATION(Goals): Goal: Pt to meet >/= 90% of their estimated nutrition needs Monitor: PO intake, weights, labs, I/O's  EDUCATION NEEDS: -No education needs identified at this time  INTERVENTION: 1. Ensure Complete po BID, each supplement provides 350 kcal and 13 grams of protein. 2. Recommend diet liberalization to encourage PO intake 3. RD to continue to follow nutrition care plan  Jarold Motto MS, RD, LDN Pager: [phone] After-hours pager: [phone]   DOCUMENTATION CODES Per approved criteria  -Severe malnutrition in the context of acute illness or injury

## 2011-12-27 NOTE — Progress Notes (Addendum)
Subjective: Patient sleepy, open eyes. Had fever last night. He still has supra pubic pain palpation.   Objective: Filed Vitals:   12/26/11 2115 12/27/11 0528 12/27/11 0600 12/27/11 0745  BP: 105/65 109/70    Pulse: 76 80    Temp: 97.9 F (36.6 C) 101.4 F (38.6 C) 100.5 F (38.1 C) 97.8 F (36.6 C)  TempSrc: Oral Oral Axillary   Resp: 17 18    Height:      Weight:  55.1 kg (121 lb 7.6 oz)    SpO2: 95% 98%     Weight change: 1.3 kg (2 lb 13.9 oz)   General: Sleepy, arausable,  in no acute distress.  HEENT: No bruits, no goiter.  Heart: Regular rate and rhythm, without murmurs, rubs, gallops.  Lungs: CTA bilateral air movement.  Abdomen: Soft, mild tender supra pubic area, nondistended, positive bowel sounds.  Extremities; no edema.    Lab Results:  Basename 12/27/11 0615 12/25/11 0600  NA 130* 133*  K 3.8 3.9  CL 98 99  CO2 20 20  GLUCOSE 138* 91  BUN 38* 16  CREATININE 0.75 0.50  CALCIUM 8.3* 8.8  MG -- --  PHOS -- --    Basename 12/24/11 1708  AST 26  ALT 13  ALKPHOS 73  BILITOT 0.7  PROT 7.2  ALBUMIN 3.6    Basename 12/27/11 0615 12/25/11 0600 12/24/11 1708  WBC 10.0 9.8 --  NEUTROABS -- -- 6.8  HGB 10.8* 11.4* --  HCT 31.5* 34.0* --  MCV 92.9 96.3 --  PLT 118* 145* --    Micro Results: Recent Results (from the past 240 hour(s))  URINE CULTURE     Status: Normal (Preliminary result)   Collection Time   12/24/11  6:43 PM      Component Value Range Status Comment   Specimen Description URINE, CATHETERIZED   Final    Special Requests NONE   Final    Culture  Setup Time 12/25/2011 01:56   Final    Colony Count >=100,000 COLONIES/ML   Final    Culture     Final    Value: STAPHYLOCOCCUS AUREUS     Note: RIFAMPIN AND GENTAMICIN SHOULD NOT BE USED AS SINGLE DRUGS FOR TREATMENT OF STAPH INFECTIONS.   Report Status PENDING   Incomplete     Studies/Results: Dg Abd 1 View  12/26/2011  *RADIOLOGY REPORT*  Clinical Data: Lower abdominal pain.   ABDOMEN - 1 VIEW  Comparison: 03/06/2010  Findings: Both small bowel and colonic gas is seen with stool noted in the rectum.  No evidence of dilated bowel loops.  No evidence of free air.  No radiopaque calculi identified.  Old L2 vertebral body compression fracture again noted as well as lumbar degenerative changes and levoscoliosis.  IMPRESSION: Nonspecific, nonobstructive bowel gas pattern.  Original Report Authenticated By: Danae Orleans, M.D.    Medications: I have reviewed the patient's current medications.  1-Weakness generalized, failure to thrive: probably related to infection. Continue with treatment underline infection.   2-UTI (lower urinary tract infection): Ua with positive nitrates, wbc 11-20, febrile. Continue with Levaquin day 4. UA grew Staph 100 K , I will add vancomycin. Patient spike fever. I will also order blood culture.    3-HYPOTHYROIDISM: Continue with synthroid.   4-Atrial fibrillation: Continue with coumadin per pharmacy. INR increase to 3. Needs adjustment coumadin.   5-DEMENTIA : continue with temazepam, aricept, namenda. ativan, per wife patient has been on both ativan and temazepam without problems  with sedation.  Patient more sleepy this morning. Will monitor. Will consider discontinue ativan.   6-Hyponatremia: sodium decrease, I will increase IV fluids to 50 cc/hr.  7-Skin lower back buttock ulcer stage 2: wound care consulted.   8-Suprapubic: Probably related to bladder spasm, UTI. bladder US with 300 cc, foley in place now. Will need to consider remove foley in 24-48 hours for voiding trial. KUB negative for obstruction.   Oxybutynin PRN for spasm.      LOS: 3 days   Keyah Blizard M.D.  [facility] 12/27/2011, 8:08 AM

## 2011-12-27 NOTE — Evaluation (Signed)
Occupational Therapy Evaluation Patient Details Name: ZEYAD DELAGUILA MRN: [id number] DOB: 16-Dec-1922 Today's Date: 12/27/2011 Time: 7829-5621 OT Time Calculation (min): 38 min  OT Assessment / Plan / Recommendation Clinical Impression  This 78 yo male admitted with fever and found to have a UTI presents to acute OT with problems below. Will benefit from continued OT acutely and at home.    OT Assessment  Patient needs continued OT Services    Follow Up Recommendations  Home health OT    Barriers to Discharge None    Equipment Recommendations  None recommended by OT    Recommendations for Other Services    Frequency  Min 2X/week    Precautions / Restrictions Precautions Precautions: Fall Restrictions Weight Bearing Restrictions: No   Pertinent Vitals/Pain Right knee and back (chronic)    ADL  Eating/Feeding: Simulated;Maximal assistance Where Assessed - Eating/Feeding: Bed level Grooming: Simulated;Maximal assistance Where Assessed - Grooming: Supine, head of bed up Upper Body Bathing: Simulated;Maximal assistance Where Assessed - Upper Body Bathing: Supine, head of bed up Lower Body Bathing: Simulated;+1 Total assistance Where Assessed - Lower Body Bathing: Supine, head of bed up;Supine, head of bed flat;Rolling right and/or left Upper Body Dressing: Simulated;+1 Total assistance Where Assessed - Upper Body Dressing: Supine, head of bed up;Supine, head of bed flat;Rolling right and/or left Lower Body Dressing: Simulated;+1 Total assistance Where Assessed - Lower Body Dressing: Supine, head of bed up;Supine, head of bed flat;Rolling right and/or left Toilet Transfer: Simulated;+2 Total assistance Toilet Transfer: Patient Percentage: 50% Toilet Transfer Method: Sit to Barista:  (Bed to recliner next to bed) Toileting - Clothing Manipulation and Hygiene: Simulated;+2 Total assistance (One to stand and one clean) Toileting - Architect  and Hygiene: Patient Percentage: 0% Where Assessed - Glass blower/designer Manipulation and Hygiene: Standing Equipment Used: Rolling walker;Gait belt Transfers/Ambulation Related to ADLs: Total A +2 pt =50%    OT Diagnosis: Generalized weakness;Cognitive deficits;Acute pain  OT Problem List: Decreased strength;Impaired balance (sitting and/or standing);Impaired UE functional use;Pain;Decreased knowledge of use of DME or AE;Decreased cognition OT Treatment Interventions: Self-care/ADL training;Patient/family education;Balance training;Therapeutic activities   OT Goals Acute Rehab OT Goals OT Goal Formulation: With patient Time For Goal Achievement: 01/10/12 Potential to Achieve Goals: Good ADL Goals Pt Will Perform Grooming: with set-up;with supervision;Unsupported;Sitting, chair;Sitting, edge of bed (2 tasks) ADL Goal: Grooming - Progress: Goal set today Pt Will Transfer to Toilet: with mod assist;Stand pivot transfer;with DME;3-in-1 ADL Goal: Toilet Transfer - Progress: Goal set today Miscellaneous OT Goals Miscellaneous OT Goal #1: Pt will roll right and left with supervison with HOB flat and use of rail to A with BADLs OT Goal: Miscellaneous Goal #1 - Progress: Goal set today Miscellaneous OT Goal #2: Pt will come up to sit with min A in prep for transfers OT Goal: Miscellaneous Goal #2 - Progress: Goal set today Miscellaneous OT Goal #3: Pt will be Mod for sit to stand from all surface.  Visit Information  Last OT Received On: 12/27/11 Assistance Needed: +2 PT/OT Co-Evaluation/Treatment: Yes    Subjective Data  Subjective: My back and left knee hurt Patient Stated Goal: Be able to go back home   Prior Functioning  Vision/Perception  Home Living Lives With: Spouse Available Help at Discharge: Personal care attendant;Available 24 hours/day Type of Home: House Home Access: Stairs to enter [employer] of Steps: 2 Entrance Stairs-Rails: Can reach both Home Layout:  Two level Alternate Level Stairs-Number of Steps: chair lift up to second  floor Bathroom Shower/Tub: Walk-in shower;Curtain Bathroom Toilet: Handicapped height Bathroom Accessibility: Yes How Accessible: Accessible via walker Home Adaptive Equipment: Walker - rolling;Shower chair with back;Wheelchair - manual;Bedside commode/3-in-1 Prior Function Level of Independence: Needs assistance Needs Assistance: Bathing;Dressing;Toileting;Meal Prep;Light Housekeeping Bath: Moderate Dressing: Moderate Toileting: Moderate Meal Prep: Total Light Housekeeping: Total Able to Take Stairs?: No Driving: No Vocation: Retired Musician: No difficulties Dominant Hand: Right      Cognition  Overall Cognitive Status: History of cognitive impairments - at baseline Area of Impairment: Memory Arousal/Alertness: Awake/alert Behavior During Session: WFL for tasks performed    Extremity/Trunk Assessment Right Upper Extremity Assessment RUE ROM/Strength/Tone: Deficits RUE ROM/Strength/Tone Deficits: 3/5--general weakness Left Upper Extremity Assessment LUE ROM/Strength/Tone: Deficits LUE ROM/Strength/Tone Deficits: 3/5 general weakness   Mobility Bed Mobility Bed Mobility: Rolling Left;Left Sidelying to Sit Rolling Left: 4: Min assist;With rail Left Sidelying to Sit: 3: Mod assist;HOB flat;With rails Transfers Transfers: Sit to Stand;Stand to Sit Sit to Stand: 1: +2 Total assist;With upper extremity assist;From bed Sit to Stand: Patient Percentage: 50% Stand to Sit: 1: +2 Total assist;With upper extremity assist;Without upper extremity assist;To chair/3-in-1 Stand to Sit: Patient Percentage: 50%   Exercise    Balance    End of Session OT - End of Session Equipment Utilized During Treatment: Gait belt Activity Tolerance: Patient limited by pain;Patient limited by fatigue Patient left: in chair;with call bell/phone within reach Nurse Communication: Mobility status (2 people  squat/stand pivot)       Evette Georges [phone] 12/27/2011, 11:30 AM

## 2011-12-27 NOTE — Care Management Note (Unsigned)
    Page 1 of 2   12/30/2011     2:43:10 PM   CARE MANAGEMENT NOTE 12/30/2011  Patient:  [name], [name]   Account Number:  [id number]  Date Initiated:  12/27/2011  Documentation initiated by:  Letha Cape  Subjective/Objective Assessment:   dx fever  admit - lives with spouse, has two aides , one for him and one for his spouse.     Action/Plan:   pt/ot eval- rec hhpt/ot  Palliative Care Consulted 7/18   Anticipated DC Date:  01/03/2012   Anticipated DC Plan:  HOME W HOME HEALTH SERVICES      DC Planning Services  CM consult      Choice offered to / List presented to:          [facility] arranged  HH-1 RN  HH-2 PT  HH-3 OT  HH-4 NURSE'S AIDE      HH agency  [facility]   Status of service:  In process, will continue to follow Medicare Important Message given?   (If response is "NO", the following Medicare IM given date fields will be blank) Date Medicare IM given:   Date Additional Medicare IM given:    Discharge Disposition:    Per UR Regulation:  Reviewed for med. necessity/level of care/duration of stay  If discussed at Long Length of Stay Meetings, dates discussed:    Comments:  12/30/11 14:31 Letha Cape RN, BSN [phone] patient's bp dropping, has pulmonary edema , tachypnea, level of consciousness has decreased compared to yesterday. Awaiting echo results to see if patient has endocarditis, patient to be transferred to SDU for closer monitoring. Family is aware of transfer per PA note, Palliative Care Consulted. Swallow eval done , questioning aspiration.   12/29/11 12:14 Letha Cape  RN, BSN [phone] patient has an aide and his spouse has an aide  with Personal Care 24 hrs around the clock. Patient is for a transthoracic ehcocardiogram to r/o endocarditis, conts on iv abx., patient has a pacemaker as well.  NCM will continue to follow for dc needs.  12/27/11 15:40 Letha Cape RN, BSN [phone] patient lives with spouse, patient has medication  coverage and transportation,  patient states he had [place] before and would like to continue with [place] for hh services. Referral made to Rich Fuchs notiified for [facility], HHa[facility], HHPT,HHOT.  Soc will begin 24-48 hrs post discharge.  NCM will continue to follow for dc needs.

## 2011-12-27 NOTE — Progress Notes (Signed)
ANTIBIOTIC CONSULT NOTE/ANTICOAGULATION CONSULT - INITIAL/Follow-Up  Pharmacy Consult for Vancomycin, Coumadin Indication: UTI; a fib  Allergies  Allergen Reactions  . Penicillins Hives  . Uroxatral (Alfuzosin Hydrochloride) Other (See Comments)    hypotension    Patient Measurements: Height: 5\' 6"  (167.6 cm) Weight: 121 lb 7.6 oz (55.1 kg) IBW/kg (Calculated) : 63.8    Vital Signs: Temp: 97.8 F (36.6 C) (07/15 0745) Temp src: Axillary (07/15 0600) BP: 109/70 mmHg (07/15 0528) Pulse Rate: 80  (07/15 0528) Intake/Output from previous day: 07/14 0701 - 07/15 0700 In: 1032.7 [P.O.:600; I.V.:432.7] Out: 600 [Urine:600] Intake/Output from this shift:    Labs:  Basename 12/27/11 0615 12/25/11 0600 12/24/11 1708  WBC 10.0 9.8 8.4  HGB 10.8* 11.4* 10.9*  PLT 118* 145* 170  LABCREA -- -- --  CREATININE 0.75 0.50 0.60   Estimated Creatinine Clearance: 50.7 ml/min (by C-G formula based on Cr of 0.75). No results found for this basename: VANCOTROUGH:2,VANCOPEAK:2,VANCORANDOM:2,GENTTROUGH:2,GENTPEAK:2,GENTRANDOM:2,TOBRATROUGH:2,TOBRAPEAK:2,TOBRARND:2,AMIKACINPEAK:2,AMIKACINTROU:2,AMIKACIN:2, in the last 72 hours   Microbiology: Recent Results (from the past 720 hour(s))  URINE CULTURE     Status: Normal   Collection Time   12/01/11 11:12 PM      Component Value Range Status Comment   Specimen Description URINE, CLEAN CATCH   Final    Special Requests NONE   Final    Culture  Setup Time [id number]   Final    Colony Count >=100,000 COLONIES/ML   Final    Culture     Final    Value: METHICILLIN RESISTANT STAPHYLOCOCCUS AUREUS     Note: RIFAMPIN AND GENTAMICIN SHOULD NOT BE USED AS SINGLE DRUGS FOR TREATMENT OF STAPH INFECTIONS. CRITICAL RESULT CALLED TO, READ BACK BY AND VERIFIED WITH: REGINA MOORE @ 0226 ON 12/04/2011 HAJAM   Report Status 12/04/2011 FINAL   Final    Organism ID, Bacteria METHICILLIN RESISTANT STAPHYLOCOCCUS AUREUS   Final   URINE CULTURE     Status:  Normal (Preliminary result)   Collection Time   12/24/11  6:43 PM      Component Value Range Status Comment   Specimen Description URINE, CATHETERIZED   Final    Special Requests NONE   Final    Culture  Setup Time 12/25/2011 01:56   Final    Colony Count >=100,000 COLONIES/ML   Final    Culture     Final    Value: STAPHYLOCOCCUS AUREUS     Note: RIFAMPIN AND GENTAMICIN SHOULD NOT BE USED AS SINGLE DRUGS FOR TREATMENT OF STAPH INFECTIONS.   Report Status PENDING   Incomplete     Assessment: 78 y.o. known to pharmacy from coumadin dosing. Now to begin vancomycin for Staph aureus UTI - sensitivities pending. Pt also on Day #4 levaquin. Pt with fever spike. Blood cultures pending.  Pt on coumadin for afib. INR 3.23 today (supratherapeutic). Suspect due to higher dose of coumadin given on 7/13 and levaquin started which potentiates the effects of warfarin. No bleeding noted. Plt down a bit - will watch.   Goal of Therapy:  Vancomycin trough level 10-15 mcg/ml INR 2-3  Plan:  1. Vancomycin 1 gm IV q24h.  2. Will f/u microbiological data, renal function, and clinical condition 3. No coumadin today. F/u INR in a.m.  Christoper Fabian, PharmD, BCPS Clinical pharmacist, pager [phone] 12/27/2011,8:14 AM

## 2011-12-28 ENCOUNTER — Inpatient Hospital Stay (HOSPITAL_COMMUNITY): Payer: Medicare Other

## 2011-12-28 DIAGNOSIS — R7881 Bacteremia: Secondary | ICD-10-CM

## 2011-12-28 DIAGNOSIS — A4901 Methicillin susceptible Staphylococcus aureus infection, unspecified site: Secondary | ICD-10-CM

## 2011-12-28 DIAGNOSIS — Z950 Presence of cardiac pacemaker: Secondary | ICD-10-CM | POA: Diagnosis present

## 2011-12-28 LAB — URINE CULTURE

## 2011-12-28 LAB — CBC
HCT: 30.7 % — ABNORMAL LOW (ref 39.0–52.0)
Hemoglobin: 10.5 g/dL — ABNORMAL LOW (ref 13.0–17.0)
MCH: 31.3 pg (ref 26.0–34.0)
RBC: 3.35 MIL/uL — ABNORMAL LOW (ref 4.22–5.81)

## 2011-12-28 LAB — BASIC METABOLIC PANEL
BUN: 35 mg/dL — ABNORMAL HIGH (ref 6–23)
CO2: 23 mEq/L (ref 19–32)
Calcium: 8.2 mg/dL — ABNORMAL LOW (ref 8.4–10.5)
Glucose, Bld: 132 mg/dL — ABNORMAL HIGH (ref 70–99)
Potassium: 3.7 mEq/L (ref 3.5–5.1)
Sodium: 131 mEq/L — ABNORMAL LOW (ref 135–145)

## 2011-12-28 LAB — PROTIME-INR: Prothrombin Time: 28.6 seconds — ABNORMAL HIGH (ref 11.6–15.2)

## 2011-12-28 MED ORDER — IOHEXOL 300 MG/ML  SOLN
100.0000 mL | Freq: Once | INTRAMUSCULAR | Status: AC | PRN
  Administered 2011-12-28: 100 mL via INTRAVENOUS

## 2011-12-28 MED ORDER — IOHEXOL 300 MG/ML  SOLN
20.0000 mL | INTRAMUSCULAR | Status: AC
  Administered 2011-12-28 (×2): 20 mL via ORAL

## 2011-12-28 MED ORDER — RIFAMPIN 300 MG PO CAPS
600.0000 mg | ORAL_CAPSULE | Freq: Every day | ORAL | Status: DC
  Administered 2011-12-28 – 2012-01-05 (×9): 600 mg via ORAL
  Filled 2011-12-28 (×9): qty 2

## 2011-12-28 MED ORDER — WARFARIN SODIUM 5 MG PO TABS
5.0000 mg | ORAL_TABLET | Freq: Once | ORAL | Status: AC
  Administered 2011-12-28: 5 mg via ORAL
  Filled 2011-12-28: qty 1

## 2011-12-28 NOTE — Progress Notes (Signed)
ANTICOAGULATION CONSULT NOTE - Follow Up Consult  Pharmacy Consult for Coumadin Indication: atrial fibrillation  Allergies  Allergen Reactions  . Penicillins Hives  . Uroxatral (Alfuzosin Hydrochloride) Other (See Comments)    hypotension    Patient Measurements: Height: 5\' 6"  (167.6 cm) Weight: 129 lb 10.1 oz (58.8 kg) IBW/kg (Calculated) : 63.8   Vital Signs: Temp: 97.7 F (36.5 C) (07/16 0942) Temp src: Oral (07/16 0942) BP: 97/60 mmHg (07/16 0942) Pulse Rate: 70  (07/16 0942)  Labs:  Basename 12/28/11 0532 12/27/11 0615 12/26/11 0520  HGB 10.5* 10.8* --  HCT 30.7* 31.5* --  PLT 111* 118* --  APTT -- -- --  LABPROT 28.6* 33.5* 27.0*  INR 2.64* 3.23* 2.45*  HEPARINUNFRC -- -- --  CREATININE 0.72 0.75 --  CKTOTAL -- -- --  CKMB -- -- --  TROPONINI -- -- --    Estimated Creatinine Clearance: 54.1 ml/min (by C-G formula based on Cr of 0.72).  Assessment: 78 yo male on Coumadin for Afib. INR is therapeutic today after holding dose last night. Levaquin discontinued, therefore drug interaction with Coumadin resolved. H/H are stable but platelets are trending down - will continue to watch.  Goal of Therapy:  INR 2-3 Monitor platelets by anticoagulation protocol: Yes   Plan:  1. Coumadin 5 mg po tonight 2. INR daily  [facility], [address].D., BCPS Clinical Pharmacist Pager: [phone] 12/28/2011 11:16 AM

## 2011-12-28 NOTE — Progress Notes (Signed)
Occupational Therapy Treatment Patient Details Name: [name] MRN: [id number] DOB: 09-Mar-1923 Today's Date: 12/28/2011 Time: 4098-1191 OT Time Calculation (min): 22 min  OT Assessment / Plan / Recommendation Comments on Treatment Session This 78 yo making progress since eval of yesterday.    Follow Up Recommendations  Home health OT    Barriers to Discharge       Equipment Recommendations  None recommended by OT    Recommendations for Other Services    Frequency Min 2X/week   Plan Discharge plan remains appropriate    Precautions / Restrictions Precautions Precautions: Fall Restrictions Weight Bearing Restrictions: No   Pertinent Vitals/Pain 5/10 back and right knee    ADL  Toilet Transfer: Performed;+2 Total assistance Toilet Transfer: Patient Percentage: 60% Toilet Transfer Method: Stand pivot Toilet Transfer Equipment: Bedside commode Toileting - Clothing Manipulation and Hygiene: Performed;+1 Total assistance Toileting - Architect and Hygiene: Patient Percentage: 0% Where Assessed - Toileting Clothing Manipulation and Hygiene: Standing Transfers/Ambulation Related to ADLs: total A +2 (pt=60%)    OT Diagnosis:    OT Problem List:   OT Treatment Interventions:     OT Goals ADL Goals ADL Goal: Toilet Transfer - Progress: Progressing toward goals Miscellaneous OT Goals OT Goal: Miscellaneous Goal #1 - Progress: Progressing toward goals OT Goal: Miscellaneous Goal #2 - Progress: Progressing toward goals OT Goal: Miscellaneous Goal #3 - Progress: Progressing toward goals  Visit Information  Last OT Received On: 12/28/11 Assistance Needed: +2    Subjective Data  Subjective: my back and right knee hurt   Prior Functioning       Cognition  Overall Cognitive Status: History of cognitive impairments - at baseline Behavior During Session: [facility] for tasks performed    Mobility Bed Mobility Bed Mobility: Rolling Right;Rolling Left;Left Sidelying  to Sit;Sitting - Scoot to [place] of Bed;Sit to Supine;Scooting to [facility] Rolling Right: 3: Mod assist;With rail (with sequencing cues and to bring hip forward) Rolling Left: 3: Mod assist;With rail (with sequencing cues and to bring hip forward) Left Sidelying to Sit: 3: Mod assist;HOB flat;With rails Sitting - Scoot to Edge of Bed: 4: Min assist (with increased time) Sit to Supine: 1: +2 Total assist;HOB flat Sit to Supine: Patient Percentage: 0% Scooting to HOB: 1: +1 Total assist Transfers Transfers: Sit to Stand;Stand to Sit Sit to Stand: 3: Mod assist;From elevated surface;With upper extremity assist;From bed Stand to Sit: 2: Max assist;With upper extremity assist;With armrests;To chair/3-in-1 Details for Transfer Assistance: Pt needs A for descent on stand to sit more than sit to stand   Exercises    Balance    End of Session OT - End of Session Activity Tolerance: Patient tolerated treatment well Patient left: in bed;with call bell/phone within reach Nurse Communication: Mobility status (with nurse Raoul Pitch) for transfers)       Evette Georges [phone] 12/28/2011, 3:17 PM

## 2011-12-28 NOTE — Consult Note (Signed)
[facility]          Day 5 antibiotics        Day 2 vancomycin       Reason for Consult: MRSA bacteremia and UTI    Referring Physician: Dr. Hartley Barefoot  Principal Problem:  *Staphylococcus aureus bacteremia Active Problems:  UTI (lower urinary tract infection)  HYPOTHYROIDISM  DEMENTIA  CAD  Atrial fibrillation  Fever  Weakness generalized  Abdominal pain  Pacemaker      . Chlorhexidine Gluconate Cloth  6 each Topical Q0600  . donepezil  5 mg Oral QHS  . feeding supplement  237 mL Oral BID BM  . iohexol  20 mL Oral Q1 Hr x 2  . levothyroxine  88 mcg Oral QAC breakfast  . LORazepam  1 mg Oral QHS  . memantine  10 mg Oral BID  . mupirocin ointment  1 application Nasal BID  . omega-3 acid ethyl esters  1 g Oral BID  . polyethylene glycol  17 g Oral Daily  . silver sulfADIAZINE  1 application Topical Daily  . sodium chloride  3 mL Intravenous Q12H  . vancomycin  1,000 mg Intravenous Q24H  . vitamin C  500 mg Oral Daily  . warfarin  5 mg Oral ONCE-1800  . Warfarin - Pharmacist Dosing Inpatient   Does not apply q1800  . DISCONTD: levofloxacin (LEVAQUIN) IV  500 mg Intravenous Q24H    Recommendations: 1. Continue vancomycin 2. Add rifampin 3. Transthoracic echocardiogram   Assessment: Mr. Maack has MRSA bacteremia complicating a UTI. I agree with vancomycin therapy and will add rifampin. With a pacemaker he is at very high risk for pacemaker endocarditis. I will check a transthoracic echocardiogram. He will need repeat blood cultures in about 48 hours in order to help determine appropriate duration of therapy. If there is any evidence for vegetation on the pacemaker wires we will need to talk to Dr. Katrinka Blazing, his cardiologist about the feasibility of explantation of the pacemaker.    HPI: [name] is a 78 y.o. male with multiple medical problems who was treated for an MRSA UTI 1 month ago. He transient improvement but recently began  to have profound weakness and suprapubic tenderness. He was admitted and his urine culture has grown MRSA. Today his blood cultures were reported to be growing gram-positive cocci in clusters.   Review of Systems: Review of systems not obtained due to patient factors.  Past Medical History  Diagnosis Date  . CHF (congestive heart failure)   . Arthritis   . Pacemaker   . Incontinence   . Pneumonia 2011; 07/29/11  . Neuropathy   . Heart murmur   . Dysrhythmia     paced  . Hypothyroidism   . Restless leg syndrome   . Blood transfusion   . Anemia   . Lower GI bleeding 2011    "from ATB I wasn't suppose to have had ordered"  . Dementia 07/29/11     not observed by this RN  . Atrial fibrillation     History  Substance Use Topics  . Smoking status: Former Smoker -- 2.0 packs/day for 15 years    Types: Cigarettes    Quit date: 06/15/1971  . Smokeless tobacco: Never Used  . Alcohol Use: No     07/29/11 "used to drink some; haven't now in quite a few years"    No family history on file. Allergies  Allergen Reactions  .  Penicillins Hives  . Uroxatral (Alfuzosin Hydrochloride) Other (See Comments)    hypotension    OBJECTIVE: Blood pressure 96/57, pulse 74, temperature 98.7 F (37.1 C), temperature source Oral, resp. rate 18, height 5\' 6"  (1.676 m), weight 58.8 kg (129 lb 10.1 oz), SpO2 96.00%. General: Extremely weak and groggy. He seemed slightly confused Skin: Scattered ecchymoses. No splinter or conjunctival hemorrhages Lungs: Clear Cor: Very distant heart sounds are difficult to hear. No murmur appreciated. Left upper chest pacemaker site appears normal Abdomen: Nontender Joints and extremities: No acute abnormality  Microbiology: Recent Results (from the past 240 hour(s))  URINE CULTURE     Status: Normal   Collection Time   12/24/11  6:43 PM      Component Value Range Status Comment   Specimen Description URINE, CATHETERIZED   Final    Special Requests NONE    Final    Culture  Setup Time 12/25/2011 01:56   Final    Colony Count >=100,000 COLONIES/ML   Final    Culture     Final    Value: METHICILLIN RESISTANT STAPHYLOCOCCUS AUREUS     Note: RIFAMPIN AND GENTAMICIN SHOULD NOT BE USED AS SINGLE DRUGS FOR TREATMENT OF STAPH INFECTIONS.     Note: CRITICAL RESULT CALLED TO, READ BACK BY AND VERIFIED WITH: SIERRA JOHNSON @ 11:26AM  12/28/11 BY DWEEKS   Report Status 12/28/2011 FINAL   Final    Organism ID, Bacteria METHICILLIN RESISTANT STAPHYLOCOCCUS AUREUS   Final   CULTURE, BLOOD (ROUTINE X 2)     Status: Normal (Preliminary result)   Collection Time   12/27/11  9:10 AM      Component Value Range Status Comment   Specimen Description BLOOD ARM RIGHT   Final    Special Requests BOTTLES DRAWN AEROBIC AND ANAEROBIC 10CC   Final    Culture  Setup Time 12/27/2011 15:49   Final    Culture     Final    Value: GRAM POSITIVE COCCI IN CLUSTERS     Note: Gram Stain Report Called to,Read Back By and Verified With: Kennyth Arnold @ 1253 12/28/11 BY KRAWS   Report Status PENDING   Incomplete   CULTURE, BLOOD (ROUTINE X 2)     Status: Normal (Preliminary result)   Collection Time   12/27/11  9:30 AM      Component Value Range Status Comment   Specimen Description BLOOD HAND RIGHT   Final    Special Requests BOTTLES DRAWN AEROBIC AND ANAEROBIC 10CC   Final    Culture  Setup Time 12/27/2011 15:48   Final    Culture     Final    Value: GRAM POSITIVE COCCI IN CLUSTERS     Note: Gram Stain Report Called to,Read Back By and Verified With: Kennyth Arnold @ 1253 12/28/11 BY KRAWS   Report Status PENDING   Incomplete   MRSA PCR SCREENING     Status: Abnormal   Collection Time   12/27/11 10:17 AM      Component Value Range Status Comment   MRSA by PCR POSITIVE (*) NEGATIVE Final     Cliffton Asters, MD [facility] [phone] pager   [phone] cell 12/28/2011, 5:38 PM

## 2011-12-28 NOTE — Progress Notes (Signed)
Received notification from [facility] labs that blood cultures are growing gram positive cocci and clusters. His urine is also revealed MRSA. Dr. Sunnie Nielsen notified. ID has been consulted as well as vancomycin started on 7/15.

## 2011-12-28 NOTE — Progress Notes (Signed)
Subjective: Patient complaining of abdominal pain , lower quadrant . Relates oxybutynin  help with pain.  He doesn't feel better.   Objective: Filed Vitals:   12/27/11 1824 12/27/11 2101 12/28/11 0240 12/28/11 0535  BP:  93/56 102/68 93/60  Pulse:  79 81 81  Temp: 97.5 F (36.4 C) 97.5 F (36.4 C) 100.1 F (37.8 C) 98.4 F (36.9 C)  TempSrc: Oral Oral Oral Oral  Resp:  18 18 17   Height:      Weight:    58.8 kg (129 lb 10.1 oz)  SpO2:  94% 94% 97%   Weight change: 3.7 kg (8 lb 2.5 oz)   General: Alert, awake, oriented x3, in no acute distress.  HEENT: No bruits, no goiter.  Heart: Regular rate and rhythm, without murmurs, rubs, gallops.  Lungs: CTA, bilateral air movement.  Abdomen: Soft, nontender, nondistended, positive bowel sounds.  Neuro: Grossly intact, nonfocal. Extremities; no edema.   Lab Results:  [facility] 12/28/11 0532 12/27/11 0615  NA 131* 130*  K 3.7 3.8  CL 98 98  CO2 23 20  GLUCOSE 132* 138*  BUN 35* 38*  CREATININE 0.72 0.75  CALCIUM 8.2* 8.3*  MG -- --  PHOS -- --    Basename 12/28/11 0532 12/27/11 0615  WBC 12.1* 10.0  NEUTROABS -- --  HGB 10.5* 10.8*  HCT 30.7* 31.5*  MCV 91.6 92.9  PLT 111* 118*    Micro Results: Recent Results (from the past 240 hour(s))  URINE CULTURE     Status: Normal (Preliminary result)   Collection Time   12/24/11  6:43 PM      Component Value Range Status Comment   Specimen Description URINE, CATHETERIZED   Final    Special Requests NONE   Final    Culture  Setup Time 12/25/2011 01:56   Final    Colony Count >=100,000 COLONIES/ML   Final    Culture     Final    Value: STAPHYLOCOCCUS AUREUS     Note: RIFAMPIN AND GENTAMICIN SHOULD NOT BE USED AS SINGLE DRUGS FOR TREATMENT OF STAPH INFECTIONS.   Report Status PENDING   Incomplete   MRSA PCR SCREENING     Status: Abnormal   Collection Time   12/27/11 10:17 AM      Component Value Range Status Comment   MRSA by PCR POSITIVE (*) NEGATIVE Final      Studies/Results: Dg Abd 1 View  12/26/2011  *RADIOLOGY REPORT*  Clinical Data: Lower abdominal pain.  ABDOMEN - 1 VIEW  Comparison: 03/06/2010  Findings: Both small bowel and colonic gas is seen with stool noted in the rectum.  No evidence of dilated bowel loops.  No evidence of free air.  No radiopaque calculi identified.  Old L2 vertebral body compression fracture again noted as well as lumbar degenerative changes and levoscoliosis.  IMPRESSION: Nonspecific, nonobstructive bowel gas pattern.  Original Report Authenticated By: Danae Orleans, M.D.    Medications: I have reviewed the patient's current medications.  1-Weakness generalized, failure to thrive: probably related to infection. Continue with treatment underline infection.   2-UTI (lower urinary tract infection): Ua with positive nitrates, wbc 11-20, febrile.  UA grew Staph 100 K , vancomycin day 2. Patient spike fever (7-15).   Blood culture pending. I discontinue Levaquin received 4 days. I will consult ID. Concern for occult bacteremia due to staph bacteruria.   3-HYPOTHYROIDISM: Continue with synthroid.   4-Atrial fibrillation: Continue with coumadin per pharmacy. INR 2.6.   5-DEMENTIA :  continue with temazepam, aricept, namenda. ativan, per wife patient has been on both ativan and temazepam without problems with sedation.   6-Hyponatremia: sodium decrease, Continue with IV fluids  50 cc/hr.   7-Skin lower back buttock ulcer stage 2: wound care consulted.   8-Suprapubic: Probably related to bladder spasm, UTI. bladder US with 300 cc, foley in place now. Will need to consider remove foley in 24-48 hours for voiding trial. KUB negative for obstruction. Oxybutynin PRN for spasm. I will order CT abdomen and pelvis due to persistent pain, tender on palpation and staph bacteruria.  9-Uremia: Continue with IV fluids.     LOS: 4 days   [place] Dube M.D.  [facility] 12/28/2011, 8:30 AM

## 2011-12-29 DIAGNOSIS — G8929 Other chronic pain: Secondary | ICD-10-CM

## 2011-12-29 LAB — PROTIME-INR: INR: 2.97 — ABNORMAL HIGH (ref 0.00–1.49)

## 2011-12-29 MED ORDER — PHENAZOPYRIDINE HCL 100 MG PO TABS
100.0000 mg | ORAL_TABLET | Freq: Three times a day (TID) | ORAL | Status: DC
  Administered 2011-12-29 – 2012-01-02 (×11): 100 mg via ORAL
  Filled 2011-12-29 (×14): qty 1

## 2011-12-29 MED ORDER — WARFARIN SODIUM 5 MG PO TABS
5.0000 mg | ORAL_TABLET | Freq: Once | ORAL | Status: AC
  Administered 2011-12-29: 5 mg via ORAL
  Filled 2011-12-29: qty 1

## 2011-12-29 NOTE — Progress Notes (Signed)
Patient ID: [name], male   DOB: 1922-12-04, 78 y.o.   MRN: [id number]   Subjective: Patient complaining of back and knee pain.  Also tender to palpation in the lower abdomen.  Objective: Filed Vitals:   12/28/11 1315 12/28/11 2044 12/29/11 0406 12/29/11 0956  BP: 96/57 90/52 108/70 98/63  Pulse: 74 75 74 79  Temp: 98.7 F (37.1 C) 98.4 F (36.9 C) 97.9 F (36.6 C) 97.6 F (36.4 C)  TempSrc: Oral Oral Oral Oral  Resp: 18 28 24 18   Height:      Weight:   57.9 kg (127 lb 10.3 oz)   SpO2: 96% 95% 97% 97%   Weight change: -0.9 kg (-1 lb 15.8 oz)   General: Alert, awake, oriented x3, in mild distress from pain. HEENT: No bruits, no goiter.  Heart: very difficult to hear. Regular rate and rhythm, without murmurs, rubs, gallops.  Lungs: CTA, bilateral air movement.  Abdomen: Soft, minimal distention, positive bowel sounds, tender to palpation in lower abdomen Neuro: Grossly intact, nonfocal. Extremities; no edema. Left knee slightly larger than right.  Not erythematous.  Lab Results:  [facility] 12/28/11 0532 12/27/11 0615  NA 131* 130*  K 3.7 3.8  CL 98 98  CO2 23 20  GLUCOSE 132* 138*  BUN 35* 38*  CREATININE 0.72 0.75  CALCIUM 8.2* 8.3*  MG -- --  PHOS -- --    Basename 12/28/11 0532 12/27/11 0615  WBC 12.1* 10.0  NEUTROABS -- --  HGB 10.5* 10.8*  HCT 30.7* 31.5*  MCV 91.6 92.9  PLT 111* 118*    Micro Results: Recent Results (from the past 240 hour(s))  URINE CULTURE     Status: Normal   Collection Time   12/24/11  6:43 PM      Component Value Range Status Comment   Specimen Description URINE, CATHETERIZED   Final    Special Requests NONE   Final    Culture  Setup Time 12/25/2011 01:56   Final    Colony Count >=100,000 COLONIES/ML   Final    Culture     Final    Value: METHICILLIN RESISTANT STAPHYLOCOCCUS AUREUS     Note: RIFAMPIN AND GENTAMICIN SHOULD NOT BE USED AS SINGLE DRUGS FOR TREATMENT OF STAPH INFECTIONS.     Note: CRITICAL RESULT CALLED  TO, READ BACK BY AND VERIFIED WITH: SIERRA JOHNSON @ 11:26AM  12/28/11 BY DWEEKS   Report Status 12/28/2011 FINAL   Final    Organism ID, Bacteria METHICILLIN RESISTANT STAPHYLOCOCCUS AUREUS   Final   CULTURE, BLOOD (ROUTINE X 2)     Status: Normal (Preliminary result)   Collection Time   12/27/11  9:10 AM      Component Value Range Status Comment   Specimen Description BLOOD ARM RIGHT   Final    Special Requests BOTTLES DRAWN AEROBIC AND ANAEROBIC 10CC   Final    Culture  Setup Time 12/27/2011 15:49   Final    Culture     Final    Value: GRAM POSITIVE COCCI IN CLUSTERS     Note: Gram Stain Report Called to,Read Back By and Verified With: Kennyth Arnold @ 1253 12/28/11 BY KRAWS   Report Status PENDING   Incomplete   CULTURE, BLOOD (ROUTINE X 2)     Status: Normal (Preliminary result)   Collection Time   12/27/11  9:30 AM      Component Value Range Status Comment   Specimen Description BLOOD HAND RIGHT   Final  Special Requests BOTTLES DRAWN AEROBIC AND ANAEROBIC 10CC   Final    Culture  Setup Time 12/27/2011 15:48   Final    Culture     Final    Value: STAPHYLOCOCCUS AUREUS     Note: RIFAMPIN AND GENTAMICIN SHOULD NOT BE USED AS SINGLE DRUGS FOR TREATMENT OF STAPH INFECTIONS.     Note: Gram Stain Report Called to,Read Back By and Verified With: Kennyth Arnold @ 1253 12/28/11 BY KRAWS   Report Status PENDING   Incomplete   MRSA PCR SCREENING     Status: Abnormal   Collection Time   12/27/11 10:17 AM      Component Value Range Status Comment   MRSA by PCR POSITIVE (*) NEGATIVE Final     Studies/Results: Ct Abdomen Pelvis W Contrast  12/28/2011  *RADIOLOGY REPORT*  Clinical Data: Lower abdominal pain.  CT ABDOMEN AND PELVIS WITH CONTRAST  Technique:  Multidetector CT imaging of the abdomen and pelvis was performed following the standard protocol during bolus administration of intravenous contrast.  Contrast: OMNIPAQUE IOHEXOL 300 MG/ML  SOLN  Comparison: 03/04/2010  Findings:  Visualized lung bases show small bilateral pleural effusions and atelectasis versus infiltrate at both posterior lung bases, right greater than left.  The liver shows evidence of periportal edema.  There may be early cirrhotic changes based on appearance of the liver by CT.  Contrast is seen in the upper IVC and hepatic veins consistent with some degree of right heart failure.  No hepatic masses are identified.  The spleen is not enlarged.  There is a calcified granuloma near the splenic hilum.  The kidneys are unremarkable.  The pancreas is atrophic.  Scattered small amount of ascites is seen throughout the peritoneal cavity. No evidence of bowel obstruction or perforation. No enlarged lymph nodes are identified.  Atherosclerotic disease of the abdominal aorta without evidence of aneurysm.  The bladder is decompressed by Foley catheter.  There is significant thickening of the bladder wall circumferentially which appears slightly irregular in contour and approaches 2 cm in estimated thickness.  This may relate to chronic outlet obstruction.  Other bladder pathology cannot be excluded by CT. The  Bones are osteopenic.  There is methylmethacrylate in a compressed L2 vertebral body related to prior kyphoplasty.  IMPRESSION:  1.  Periportal edema in the liver and possible early cirrhotic changes of the liver. 2.  Evidence of right heart failure with reflux of contrast into the IVC and hepatic veins. 3.  Scattered small amount of ascites throughout the peritoneal cavity without evidence of mass lesions or enlarged lymph nodes. 4.  Significant irregular wall thickening of the bladder.  This may relate to chronic outlet obstruction.  Other bladder pathology such as tumor or cystitis cannot be excluded.  Original Report Authenticated By: Reola Calkins, M.D.    Medications: I have reviewed the patient's current medications.  Weakness generalized, failure to thrive: probably related to infection. Continue with  treatment underline infection.   Blood and Urine Cultures positive for MRSA. MRSA Bacteremia.   Infectious disease on board.  Checking 2D echo for positive endocarditis (concerned about pace maker leads).  APPRECIATE ID INPUT!.  Received Levaquin for 4 days now on Vanc and Rifampin per ID.  HYPOTHYROIDISM: Continue with synthroid.   Atrial fibrillation: Continue with coumadin per pharmacy. INR 2.6.   DEMENTIA : continue with temazepam, aricept, namenda. ativan, per wife patient has been on both ativan and temazepam without problems with sedation.   Hyponatremia:  sodium stable but slightly low, Continue with IV fluids  50 cc/hr.   Skin lower back buttock ulcer stage 2: wound care consulted. Following their recommendations.  Suprapubic: Probably related to bladder spasm, UTI. bladder US with 300 cc, foley in place now. Will need to consider remove foley in 24-48 hours for voiding trial. KUB negative for obstruction. Oxybutynin PRN for spasm.  CT abdomen showed unusual bladder wall thickening.  Hopefully this is due to UTI.  Once UTI has cleared if bladder pain persists Urology evaluation would be appropriate.  Uremia: Continue with IV fluids.   Disposition:  Patient from home where he lives with his wife.  My understanding is that the wife is also having health problems.  The patient will need significant support at home initially and may require a short term SNF when he is medically ready for discharge.  I have left a voice mail for his daughter Dr. Doristine Counter to call me.    LOS: 5 days   Conley Canal [phone] [facility] 12/29/2011, 10:19 AM  Patient seen and examined, 2 D echo results are pending. Agree with above note

## 2011-12-29 NOTE — Progress Notes (Signed)
Physical Therapy Treatment Patient Details Name: [name] MRN: [id number] DOB: 01/09/1923 Today's Date: 12/29/2011 Time: 7829-5621 PT Time Calculation (min): 19 min  PT Assessment / Plan / Recommendation Comments on Treatment Session  Pt's home caregivers present along with wife.  Wife confirms that there will be 2 aides available at home 24 hrs/day.  Pt able to initiate ambulation today.      Follow Up Recommendations  Home health PT;Supervision/Assistance - 24 hour    Barriers to Discharge        Equipment Recommendations       Recommendations for Other Services    Frequency Min 3X/week   Plan Discharge plan remains appropriate;Frequency remains appropriate    Precautions / Restrictions Precautions Precautions: Fall Restrictions Weight Bearing Restrictions: No       Mobility  Bed Mobility Bed Mobility: Supine to Sit;Sitting - Scoot to Edge of Bed Supine to Sit: 2: Max assist;With rails;HOB elevated Supine to Sit: Patient Percentage: 40% Sitting - Scoot to Edge of Bed: 4: Min assist Details for Bed Mobility Assistance: Cues for technique.  Assist to initiate, LE management, & to lift shoulders/trunk to sitting upright, use of draw pad to bring hip closer to EOB.   Transfers Transfers: Sit to Stand;Stand to Sit Sit to Stand: 3: Mod assist;With upper extremity assist;From bed Sit to Stand: Patient Percentage: 60% Stand to Sit: With upper extremity assist;With armrests;To chair/3-in-1;3: Mod assist Stand to Sit: Patient Percentage: 50% Details for Transfer Assistance: Cues for technique.  Assist to achieve standing, anterior weight shifting of trunk over BOS, balance, & controlled descent.   Ambulation/Gait Ambulation/Gait Assistance: 3: Mod assist;Other (comment) (+1 to follow with recliner) Ambulation Distance (Feet): 8 Feet Assistive device: Rolling walker Ambulation/Gait Assistance Details: Pt unable to follow cues for stand-pivot technique with use of RW but  when instructed to just walk forwards he did much better.  (A) for balance & RW management.  Pt with trunk flexed forwards, shuffle steps, feet pointed outwards.   Gait Pattern: Decreased step length - right;Decreased step length - left;Shuffle;Trunk flexed Stairs: No Wheelchair Mobility Wheelchair Mobility: No      PT Goals Acute Rehab PT Goals Time For Goal Achievement: 01/02/12 Potential to Achieve Goals: Fair PT Goal: Supine/Side to Sit - Progress: Progressing toward goal PT Goal: Sit to Stand - Progress: Progressing toward goal PT Goal: Stand to Sit - Progress: Progressing toward goal PT Goal: Ambulate - Progress: Progressing toward goal  Visit Information  Last PT Received On: 12/29/11 Assistance Needed: +2    Subjective Data      Cognition  Overall Cognitive Status: History of cognitive impairments - at baseline Behavior During Session: [facility] for tasks performed    Balance     End of Session PT - End of Session Equipment Utilized During Treatment: Gait belt Activity Tolerance: Patient limited by fatigue Patient left: in chair;with call bell/phone within reach;with family/visitor present Nurse Communication: Mobility status     Verdell Face, [place] [phone] 12/29/2011

## 2011-12-29 NOTE — Progress Notes (Signed)
Patient ID: [name], male   DOB: 10-Dec-1922, 78 y.o.   MRN: [id number]    [facility] for Infectious Disease    Date of Admission:  12/24/2011           Day 3 vancomycin Principal Problem:  *Staphylococcus aureus bacteremia Active Problems:  UTI (lower urinary tract infection)  HYPOTHYROIDISM  DEMENTIA  CAD  Atrial fibrillation  Fever  Weakness generalized  Abdominal pain  Pacemaker      . Chlorhexidine Gluconate Cloth  6 each Topical Q0600  . donepezil  5 mg Oral QHS  . feeding supplement  237 mL Oral BID BM  . levothyroxine  88 mcg Oral QAC breakfast  . LORazepam  1 mg Oral QHS  . memantine  10 mg Oral BID  . mupirocin ointment  1 application Nasal BID  . omega-3 acid ethyl esters  1 g Oral BID  . polyethylene glycol  17 g Oral Daily  . rifampin  600 mg Oral Daily  . silver sulfADIAZINE  1 application Topical Daily  . sodium chloride  3 mL Intravenous Q12H  . vancomycin  1,000 mg Intravenous Q24H  . vitamin C  500 mg Oral Daily  . warfarin  5 mg Oral ONCE-1800  . warfarin  5 mg Oral ONCE-1800  . Warfarin - Pharmacist Dosing Inpatient   Does not apply q1800    Subjective: His chronic back and right knee pain is a little bit worse over the past several weeks. He is feeling a little bit better today.  Objective: Temp:  [97.6 F (36.4 C)-98.7 F (37.1 C)] 97.6 F (36.4 C) (07/17 0956) Pulse Rate:  [74-79] 79  (07/17 0956) Resp:  [18-28] 18  (07/17 0956) BP: (90-108)/(52-70) 98/63 mmHg (07/17 0956) SpO2:  [95 %-97 %] 97 % (07/17 0956) Weight:  [57.9 kg (127 lb 10.3 oz)] 57.9 kg (127 lb 10.3 oz) (07/17 0406)  General: He is sitting up in a chair and attempts to smile Skin: No split or conjunctival hemorrhages. Lungs: Clear Cor: Distant heart sounds. No murmur heard Abdomen: Soft and nontender Extremities: Mild chronic swelling of his right knee without any evidence of infection  Lab Results Lab Results  Component Value Date   WBC 12.1* 12/28/2011   HGB 10.5* 12/28/2011   HCT 30.7* 12/28/2011   MCV 91.6 12/28/2011   PLT 111* 12/28/2011    Lab Results  Component Value Date   CREATININE 0.72 12/28/2011   BUN 35* 12/28/2011   NA 131* 12/28/2011   K 3.7 12/28/2011   CL 98 12/28/2011   CO2 23 12/28/2011    Lab Results  Component Value Date   ALT 13 12/24/2011   AST 26 12/24/2011   ALKPHOS 73 12/24/2011   BILITOT 0.7 12/24/2011      Microbiology: Recent Results (from the past 240 hour(s))  URINE CULTURE     Status: Normal   Collection Time   12/24/11  6:43 PM      Component Value Range Status Comment   Specimen Description URINE, CATHETERIZED   Final    Special Requests NONE   Final    Culture  Setup Time 12/25/2011 01:56   Final    Colony Count >=100,000 COLONIES/ML   Final    Culture     Final    Value: METHICILLIN RESISTANT STAPHYLOCOCCUS AUREUS     Note: RIFAMPIN AND GENTAMICIN SHOULD NOT BE USED AS SINGLE DRUGS FOR TREATMENT OF STAPH INFECTIONS.     Note: CRITICAL  RESULT CALLED TO, READ BACK BY AND VERIFIED WITH: SIERRA JOHNSON @ 11:26AM  12/28/11 BY DWEEKS   Report Status 12/28/2011 FINAL   Final    Organism ID, Bacteria METHICILLIN RESISTANT STAPHYLOCOCCUS AUREUS   Final   CULTURE, BLOOD (ROUTINE X 2)     Status: Normal (Preliminary result)   Collection Time   12/27/11  9:10 AM      Component Value Range Status Comment   Specimen Description BLOOD ARM RIGHT   Final    Special Requests BOTTLES DRAWN AEROBIC AND ANAEROBIC 10CC   Final    Culture  Setup Time 12/27/2011 15:49   Final    Culture     Final    Value: GRAM POSITIVE COCCI IN CLUSTERS     Note: Gram Stain Report Called to,Read Back By and Verified With: Kennyth Arnold @ 1253 12/28/11 BY KRAWS   Report Status PENDING   Incomplete   CULTURE, BLOOD (ROUTINE X 2)     Status: Normal (Preliminary result)   Collection Time   12/27/11  9:30 AM      Component Value Range Status Comment   Specimen Description BLOOD HAND RIGHT   Final    Special Requests BOTTLES DRAWN AEROBIC AND  ANAEROBIC 10CC   Final    Culture  Setup Time 12/27/2011 15:48   Final    Culture     Final    Value: STAPHYLOCOCCUS AUREUS     Note: RIFAMPIN AND GENTAMICIN SHOULD NOT BE USED AS SINGLE DRUGS FOR TREATMENT OF STAPH INFECTIONS.     Note: Gram Stain Report Called to,Read Back By and Verified With: Kennyth Arnold @ 1253 12/28/11 BY KRAWS   Report Status PENDING   Incomplete   MRSA PCR SCREENING     Status: Abnormal   Collection Time   12/27/11 10:17 AM      Component Value Range Status Comment   MRSA by PCR POSITIVE (*) NEGATIVE Final     Studies/Results: Ct Abdomen Pelvis W Contrast  12/28/2011  *RADIOLOGY REPORT*  Clinical Data: Lower abdominal pain.  CT ABDOMEN AND PELVIS WITH CONTRAST  Technique:  Multidetector CT imaging of the abdomen and pelvis was performed following the standard protocol during bolus administration of intravenous contrast.  Contrast: OMNIPAQUE IOHEXOL 300 MG/ML  SOLN  Comparison: 03/04/2010  Findings: Visualized lung bases show small bilateral pleural effusions and atelectasis versus infiltrate at both posterior lung bases, right greater than left.  The liver shows evidence of periportal edema.  There may be early cirrhotic changes based on appearance of the liver by CT.  Contrast is seen in the upper IVC and hepatic veins consistent with some degree of right heart failure.  No hepatic masses are identified.  The spleen is not enlarged.  There is a calcified granuloma near the splenic hilum.  The kidneys are unremarkable.  The pancreas is atrophic.  Scattered small amount of ascites is seen throughout the peritoneal cavity. No evidence of bowel obstruction or perforation. No enlarged lymph nodes are identified.  Atherosclerotic disease of the abdominal aorta without evidence of aneurysm.  The bladder is decompressed by Foley catheter.  There is significant thickening of the bladder wall circumferentially which appears slightly irregular in contour and approaches 2 cm in  estimated thickness.  This may relate to chronic outlet obstruction.  Other bladder pathology cannot be excluded by CT. The  Bones are osteopenic.  There is methylmethacrylate in a compressed L2 vertebral body related to prior kyphoplasty.  IMPRESSION:  1.  Periportal edema in the liver and possible early cirrhotic changes of the liver. 2.  Evidence of right heart failure with reflux of contrast into the IVC and hepatic veins. 3.  Scattered small amount of ascites throughout the peritoneal cavity without evidence of mass lesions or enlarged lymph nodes. 4.  Significant irregular wall thickening of the bladder.  This may relate to chronic outlet obstruction.  Other bladder pathology such as tumor or cystitis cannot be excluded.  Original Report Authenticated By: Reola Calkins, M.D.    Assessment: He has MRSA bacteremia complicated by urinary tract infection. He is also at high risk for pacemaker endocarditis and with his increased back pain could have vertebral infection as well. I favor continuing vancomycin and rifampin for now pending his echocardiogram. He will need repeat blood cultures tomorrow. I would not pursue immediate vertebral imaging at this point because I am not sure that it will change his overall management.  Plan: 1. Continue vancomycin and rifampin 2. Transthoracic echocardiogram 3. Repeat blood cultures tomorrow morning  Cliffton Asters, MD [facility] for Infectious Disease [facility] [phone] pager   [phone] cell 12/29/2011, 12:33 PM

## 2011-12-29 NOTE — Progress Notes (Signed)
ANTICOAGULATION CONSULT NOTE - Follow Up Consult  Pharmacy Consult for Coumadin Indication: atrial fibrillation  Allergies  Allergen Reactions  . Penicillins Hives  . Uroxatral (Alfuzosin Hydrochloride) Other (See Comments)    hypotension    Patient Measurements: Height: 5\' 6"  (167.6 cm) Weight: 127 lb 10.3 oz (57.9 kg) IBW/kg (Calculated) : 63.8   Vital Signs: Temp: 97.6 F (36.4 C) (07/17 0956) Temp src: Oral (07/17 0956) BP: 98/63 mmHg (07/17 0956) Pulse Rate: 79  (07/17 0956)  Labs:  Basename 12/29/11 0557 12/28/11 0532 12/27/11 0615  HGB -- 10.5* 10.8*  HCT -- 30.7* 31.5*  PLT -- 111* 118*  APTT -- -- --  LABPROT 31.4* 28.6* 33.5*  INR 2.97* 2.64* 3.23*  HEPARINUNFRC -- -- --  CREATININE -- 0.72 0.75  CKTOTAL -- -- --  CKMB -- -- --  TROPONINI -- -- --    Estimated Creatinine Clearance: 53.3 ml/min (by C-G formula based on Cr of 0.72).  Assessment: 78 yo male on Coumadin for Afib. INR is therapeutic today. Pt is now on rifampin which could increase the metabolism of coumadin. Pt is also on vanc for staph bacteremia. Will check for trough tomorrow.  Goal of Therapy:  INR 2-3 Monitor platelets by anticoagulation protocol: Yes   Plan:  1. Coumadin 5 mg po tonight 2. INR daily 3. Vanc trough tomorrow

## 2011-12-30 ENCOUNTER — Inpatient Hospital Stay (HOSPITAL_COMMUNITY): Payer: Medicare Other

## 2011-12-30 DIAGNOSIS — J811 Chronic pulmonary edema: Secondary | ICD-10-CM | POA: Diagnosis present

## 2011-12-30 DIAGNOSIS — J189 Pneumonia, unspecified organism: Secondary | ICD-10-CM

## 2011-12-30 LAB — COMPREHENSIVE METABOLIC PANEL
ALT: 30 U/L (ref 0–53)
AST: 28 U/L (ref 0–37)
Alkaline Phosphatase: 132 U/L — ABNORMAL HIGH (ref 39–117)
CO2: 23 mEq/L (ref 19–32)
Chloride: 99 mEq/L (ref 96–112)
GFR calc non Af Amer: >90 mL/min (ref >90)
Potassium: 3.8 mEq/L (ref 3.5–5.1)
Sodium: 130 mEq/L — ABNORMAL LOW (ref 135–145)
Total Bilirubin: 1.7 mg/dL — ABNORMAL HIGH (ref 0.3–1.2)

## 2011-12-30 LAB — BLOOD GAS, ARTERIAL
Acid-base deficit: 0.1 mmol/L (ref 0.0–2.0)
TCO2: 23.4 mmol/L (ref 0–100)
pCO2 arterial: 28.1 mmHg — ABNORMAL LOW (ref 35.0–45.0)

## 2011-12-30 LAB — CBC
Platelets: 153 10*3/uL (ref 150–400)
RBC: 3.51 MIL/uL — ABNORMAL LOW (ref 4.22–5.81)
WBC: 16.8 10*3/uL — ABNORMAL HIGH (ref 4.0–10.5)

## 2011-12-30 LAB — CULTURE, BLOOD (ROUTINE X 2)

## 2011-12-30 LAB — PROTIME-INR
INR: 4.43 — ABNORMAL HIGH (ref 0.00–1.49)
Prothrombin Time: 42.9 seconds — ABNORMAL HIGH (ref 11.6–15.2)

## 2011-12-30 MED ORDER — FUROSEMIDE 10 MG/ML IJ SOLN
40.0000 mg | Freq: Once | INTRAMUSCULAR | Status: AC
  Administered 2011-12-30: 40 mg via INTRAVENOUS
  Filled 2011-12-30: qty 4

## 2011-12-30 MED ORDER — VANCOMYCIN HCL IN DEXTROSE 1-5 GM/200ML-% IV SOLN
1000.0000 mg | Freq: Two times a day (BID) | INTRAVENOUS | Status: DC
  Administered 2011-12-30 – 2012-01-01 (×5): 1000 mg via INTRAVENOUS
  Filled 2011-12-30 (×12): qty 200

## 2011-12-30 MED ORDER — SODIUM CHLORIDE 0.9 % IV SOLN
INTRAVENOUS | Status: DC
  Administered 2011-12-31 (×2): 20 mL/h via INTRAVENOUS

## 2011-12-30 NOTE — Progress Notes (Signed)
Notified Craige Cotta, NP that patient states he is having shortness of breath this morning.  Patient's lungs are clear on ausculation. Patient requesting breathing treatment no current orders for breathing treatment. Patient resting quietly now. Patient having no chest pain. O2 sat 99% RA.  No new orders given, states morning shift will be up to do rounds. Will continue to monitor patient. Jerrye Bushy

## 2011-12-30 NOTE — Progress Notes (Signed)
  Echocardiogram 2D Echocardiogram has been performed.  Vanessia Bokhari 12/30/2011, 11:23 AM

## 2011-12-30 NOTE — Progress Notes (Signed)
Report called to Tobi Bastos ,RN on 2600. Spoke with wife earlier and made her aware that pateint. Also attempted to reach daughterv [name] at [phone] message left. Patient skin with stage II to sacral with mepilex changed today.

## 2011-12-30 NOTE — Progress Notes (Signed)
Patient ID: [name], male   DOB: Feb 01, 1923, 78 y.o.   MRN: [id number]    [facility] for Infectious Disease    Date of Admission:  12/24/2011   Total days of antibiotics 4         Principal Problem:  *Staphylococcus aureus bacteremia Active Problems:  UTI (lower urinary tract infection)  HYPOTHYROIDISM  DEMENTIA  CAD  Atrial fibrillation  Fever  Weakness generalized  Abdominal pain  Pacemaker  Pulmonary edema      . Chlorhexidine Gluconate Cloth  6 each Topical Q0600  . donepezil  5 mg Oral QHS  . feeding supplement  237 mL Oral BID BM  . furosemide  40 mg Intravenous Once  . levothyroxine  88 mcg Oral QAC breakfast  . LORazepam  1 mg Oral QHS  . memantine  10 mg Oral BID  . mupirocin ointment  1 application Nasal BID  . omega-3 acid ethyl esters  1 g Oral BID  . phenazopyridine  100 mg Oral TID WC  . polyethylene glycol  17 g Oral Daily  . rifampin  600 mg Oral Daily  . silver sulfADIAZINE  1 application Topical Daily  . sodium chloride  3 mL Intravenous Q12H  . vancomycin  1,000 mg Intravenous Q12H  . vitamin C  500 mg Oral Daily  . warfarin  5 mg Oral ONCE-1800  . Warfarin - Pharmacist Dosing Inpatient   Does not apply q1800  . DISCONTD: vancomycin  1,000 mg Intravenous Q24H    Subjective: He denies cough  Objective: Temp:  [97.1 F (36.2 C)-99.2 F (37.3 C)] 97.1 F (36.2 C) (07/18 1304) Pulse Rate:  [76-83] 77  (07/18 1304) Resp:  [18-36] 22  (07/18 1315) BP: (87-119)/(49-77) 92/54 mmHg (07/18 1315) SpO2:  [92 %-100 %] 92 % (07/18 1304) Weight:  [58.9 kg (129 lb 13.6 oz)] 58.9 kg (129 lb 13.6 oz) (07/18 8657)  General: His weight is up as much is 9 pounds since admission but he said about 2 L of diuresis today. His nurse reports that he has been confused and has been hypotensive following diuresis Skin: Scattered ecchymoses Lungs: Clear anteriorly Cor: Distant heart sounds. No murmur heard Abdomen: Soft and nontender Left upper chest  pacemaker site looks normal  Lab Results Lab Results  Component Value Date   WBC 16.8* 12/30/2011   HGB 11.2* 12/30/2011   HCT 31.1* 12/30/2011   MCV 88.6 12/30/2011   PLT 153 12/30/2011    Lab Results  Component Value Date   CREATININE 0.48* 12/30/2011   BUN 19 12/30/2011   NA 130* 12/30/2011   K 3.8 12/30/2011   CL 99 12/30/2011   CO2 23 12/30/2011    Lab Results  Component Value Date   ALT 30 12/30/2011   AST 28 12/30/2011   ALKPHOS 132* 12/30/2011   BILITOT 1.7* 12/30/2011      Microbiology: Recent Results (from the past 240 hour(s))  URINE CULTURE     Status: Normal   Collection Time   12/24/11  6:43 PM      Component Value Range Status Comment   Specimen Description URINE, CATHETERIZED   Final    Special Requests NONE   Final    Culture  Setup Time 12/25/2011 01:56   Final    Colony Count >=100,000 COLONIES/ML   Final    Culture     Final    Value: METHICILLIN RESISTANT STAPHYLOCOCCUS AUREUS     Note: RIFAMPIN AND GENTAMICIN SHOULD  NOT BE USED AS SINGLE DRUGS FOR TREATMENT OF STAPH INFECTIONS.     Note: CRITICAL RESULT CALLED TO, READ BACK BY AND VERIFIED WITH: SIERRA JOHNSON @ 11:26AM  12/28/11 BY DWEEKS   Report Status 12/28/2011 FINAL   Final    Organism ID, Bacteria METHICILLIN RESISTANT STAPHYLOCOCCUS AUREUS   Final   CULTURE, BLOOD (ROUTINE X 2)     Status: Normal   Collection Time   12/27/11  9:10 AM      Component Value Range Status Comment   Specimen Description BLOOD ARM RIGHT   Final    Special Requests BOTTLES DRAWN AEROBIC AND ANAEROBIC 10CC   Final    Culture  Setup Time 12/27/2011 15:49   Final    Culture     Final    Value: STAPHYLOCOCCUS AUREUS     Note: SUSCEPTIBILITIES PERFORMED ON PREVIOUS CULTURE WITHIN THE LAST 5 DAYS.     Note: Gram Stain Report Called to,Read Back By and Verified With: Kennyth Arnold @ 1253 12/28/11 BY KRAWS   Report Status 12/30/2011 FINAL   Final   CULTURE, BLOOD (ROUTINE X 2)     Status: Normal   Collection Time   12/27/11  9:30  AM      Component Value Range Status Comment   Specimen Description BLOOD HAND RIGHT   Final    Special Requests BOTTLES DRAWN AEROBIC AND ANAEROBIC 10CC   Final    Culture  Setup Time 12/27/2011 15:48   Final    Culture     Final    Value: METHICILLIN RESISTANT STAPHYLOCOCCUS AUREUS     Note: RIFAMPIN AND GENTAMICIN SHOULD NOT BE USED AS SINGLE DRUGS FOR TREATMENT OF STAPH INFECTIONS. CRITICAL RESULT CALLED TO, READ BACK BY AND VERIFIED WITH: MICHELLE M. @1400  12/29/11 BY KRAWS This organism DOES NOT demonstrate inducible Clindamycin      resistance in vitro.     Note: Gram Stain Report Called to,Read Back By and Verified With: Kennyth Arnold @ 1253 12/28/11 BY KRAWS   Report Status 12/30/2011 FINAL   Final    Organism ID, Bacteria METHICILLIN RESISTANT STAPHYLOCOCCUS AUREUS   Final   MRSA PCR SCREENING     Status: Abnormal   Collection Time   12/27/11 10:17 AM      Component Value Range Status Comment   MRSA by PCR POSITIVE (*) NEGATIVE Final     Studies/Results: Dg Chest Port 1 View  12/30/2011  *RADIOLOGY REPORT*  Clinical Data: Rapid breathing.  PORTABLE CHEST - 1 VIEW  Comparison: Chest 12/24/2011.  Findings: Pacing device is again identified.  There has been marked increase in bilateral airspace disease in the mid and lower lung zones, much worse on the right.  There is likely a right pleural effusion.  Cardiomegaly is noted.  IMPRESSION: Marked worsening of right worse than left airspace disease and right pleural effusion.  Finding could be due to aspiration, edema or pneumonia.  These results will be called to the ordering clinician or representative by the Radiologist Assistant, and communication documented in the PACS Dashboard.  Original Report Authenticated By: Bernadene Bell. Maricela Curet, M.D.    Assessment: I suspect that his chest x-ray findings are due to volume overload and that his hypotension is related to his brisk diuresis today. I am less concerned about the possibility of  healthcare associated pneumonia and would not expand his antibiotic therapy at this time. If he were to worsen and become more unstable overnight I would consider the empiric addition  of cefepime.  Plan: 1. Continue vancomycin and rifampin 2. Await results of the transthoracic echocardiogram and repeat blood cultures  Cliffton Asters, MD [facility] for Infectious Disease [facility] [phone] pager   [phone] cell 12/30/2011, 3:13 PM

## 2011-12-30 NOTE — Progress Notes (Signed)
Called by RN to the patient's room.  He has recently received lasix for pulmonary edema and tachypnea.  His blood pressure is dropping.  Level of consciousness is decreased compared to yesterday and even earlier this morning. He has been diagnosed with MRSA Bacteremia and UTI.  Awaiting Echo results to determine if he may have endocarditis or nidus of infection in his pacemaker wires/hardware.   Will transfer to Step-down for closer monitoring.  Palliative medicine has been consulted.  Family aware of transfer.  Patient is a full code.  Edward Downs, PA-C [facility] Pager: [phone]

## 2011-12-30 NOTE — Progress Notes (Signed)
Palliative Medicine Team consult for goals of care requested by [name], PA/Dr Sharl Ma- spoke with patient at bedside who is alert/oriented able to tell me names of daughters; having increased WOB RR=24-26 using accessory muscles; increased fatigue, unable to finish sentences- per chart notes plan at this time to move to SDU for further monitoring - contacted wife at home to obtain daughter's contact information- wife asked "how is he- they told me they would be moving him to the ICU-can I talk to him -gave phone to patient who was able to speak with wife briefly - contacted daughter [name] on phone [phone]) meeting with PMT scheduled for first available time -tomorrow Friday, 12/31/11 @ 4:30 pm daughter given PMT phone contact and encouraged to call with any concerns questions prior to meeting- daughter requests primary team contact her with update- [name] PA contacted and requested to call daughter [name] at [phone] -she will f/u  PMT meeting confirmed for Friday 7/19 @ 4:30 pm   Valente David, RN 12/30/2011, 2:58 PM Palliative Medicine Team RN Liaison [phone]

## 2011-12-30 NOTE — Progress Notes (Signed)
Patient is currently active with [facility] Care Management services. Receives RN Care Coordination services for ongoing disease management of CHF, Urinary Retention, and recurrent PNA.  Genevieve Norlander currently provides a Charity fundraiser to support ongoing sacral wound care.  The benefits Palliative care services have been discussed with the patient.  His family is more receptive but he continues to decline at present.  Upon discharge patient will receive a transition of care call and home visit follow ups monthly or more frequently as indicated by assessment.  Please assure that Genevieve Norlander receives orders to resume home health services as previously ordered with the specific wound care orders noted. For any additional questions or new referrals please contact Anibal Henderson BSN RN [facility] Liaison at [phone].

## 2011-12-30 NOTE — Progress Notes (Signed)
Patient ID: [name], male   DOB: Feb 15, 1923, 78 y.o.   MRN: [id number]   Subjective: Patient has been tachypnic and lethargic since last night. CXR done this am shows pulmonary edma  Objective: Filed Vitals:   12/29/11 2116 12/30/11 0223 12/30/11 0614 12/30/11 1007  BP: 102/62 111/73 117/75 119/77  Pulse: 76 83 82 78  Temp: 99.2 F (37.3 C) 98.5 F (36.9 C) 97.7 F (36.5 C) 97.7 F (36.5 C)  TempSrc: Oral Oral Oral Axillary  Resp: 24 36 28 18  Height:      Weight:   58.9 kg (129 lb 13.6 oz)   SpO2: 96% 93% 99% 100%   Weight change: 1 kg (2 lb 3.3 oz)   General: lethargic, responds to verbal stimuli,  HEENT: No bruits, no goiter.  Heart: S1S2 RRR Lungs: Bibasilar crackles Abdomen: Soft, minimal distention, positive bowel sounds, tender to palpation in lower abdomen Neuro: Lethargic, drowsy  arousable to verbal stimuli Extremities; no edema.   Lab Results:  [facility] 12/30/11 0917 12/28/11 0532  NA 130* 131*  K 3.8 3.7  CL 99 98  CO2 23 23  GLUCOSE 119* 132*  BUN 19 35*  CREATININE 0.48* 0.72  CALCIUM 8.0* 8.2*  MG -- --  PHOS -- --    Basename 12/30/11 0917 12/28/11 0532  WBC 16.8* 12.1*  NEUTROABS -- --  HGB 11.2* 10.5*  HCT 31.1* 30.7*  MCV 88.6 91.6  PLT 153 111*    Micro Results: Recent Results (from the past 240 hour(s))  URINE CULTURE     Status: Normal   Collection Time   12/24/11  6:43 PM      Component Value Range Status Comment   Specimen Description URINE, CATHETERIZED   Final    Special Requests NONE   Final    Culture  Setup Time 12/25/2011 01:56   Final    Colony Count >=100,000 COLONIES/ML   Final    Culture     Final    Value: METHICILLIN RESISTANT STAPHYLOCOCCUS AUREUS     Note: RIFAMPIN AND GENTAMICIN SHOULD NOT BE USED AS SINGLE DRUGS FOR TREATMENT OF STAPH INFECTIONS.     Note: CRITICAL RESULT CALLED TO, READ BACK BY AND VERIFIED WITH: SIERRA JOHNSON @ 11:26AM  12/28/11 BY DWEEKS   Report Status 12/28/2011 FINAL   Final    Organism ID, Bacteria METHICILLIN RESISTANT STAPHYLOCOCCUS AUREUS   Final   CULTURE, BLOOD (ROUTINE X 2)     Status: Normal   Collection Time   12/27/11  9:10 AM      Component Value Range Status Comment   Specimen Description BLOOD ARM RIGHT   Final    Special Requests BOTTLES DRAWN AEROBIC AND ANAEROBIC 10CC   Final    Culture  Setup Time 12/27/2011 15:49   Final    Culture     Final    Value: STAPHYLOCOCCUS AUREUS     Note: SUSCEPTIBILITIES PERFORMED ON PREVIOUS CULTURE WITHIN THE LAST 5 DAYS.     Note: Gram Stain Report Called to,Read Back By and Verified With: Kennyth Arnold @ 1253 12/28/11 BY KRAWS   Report Status 12/30/2011 FINAL   Final   CULTURE, BLOOD (ROUTINE X 2)     Status: Normal   Collection Time   12/27/11  9:30 AM      Component Value Range Status Comment   Specimen Description BLOOD HAND RIGHT   Final    Special Requests BOTTLES DRAWN AEROBIC AND ANAEROBIC 10CC  Final    Culture  Setup Time 12/27/2011 15:48   Final    Culture     Final    Value: METHICILLIN RESISTANT STAPHYLOCOCCUS AUREUS     Note: RIFAMPIN AND GENTAMICIN SHOULD NOT BE USED AS SINGLE DRUGS FOR TREATMENT OF STAPH INFECTIONS. CRITICAL RESULT CALLED TO, READ BACK BY AND VERIFIED WITH: MICHELLE M. @1400  12/29/11 BY KRAWS This organism DOES NOT demonstrate inducible Clindamycin      resistance in vitro.     Note: Gram Stain Report Called to,Read Back By and Verified With: Kennyth Arnold @ 1253 12/28/11 BY KRAWS   Report Status 12/30/2011 FINAL   Final    Organism ID, Bacteria METHICILLIN RESISTANT STAPHYLOCOCCUS AUREUS   Final   MRSA PCR SCREENING     Status: Abnormal   Collection Time   12/27/11 10:17 AM      Component Value Range Status Comment   MRSA by PCR POSITIVE (*) NEGATIVE Final     Studies/Results: Ct Abdomen Pelvis W Contrast  12/28/2011  *RADIOLOGY REPORT*  Clinical Data: Lower abdominal pain.  CT ABDOMEN AND PELVIS WITH CONTRAST   IMPRESSION:  1.  Periportal edema in the liver and possible  early cirrhotic changes of the liver. 2.  Evidence of right heart failure with reflux of contrast into the IVC and hepatic veins. 3.  Scattered small amount of ascites throughout the peritoneal cavity without evidence of mass lesions or enlarged lymph nodes. 4.  Significant irregular wall thickening of the bladder.  This may relate to chronic outlet obstruction.  Other bladder pathology such as tumor or cystitis cannot be excluded.  Original Report Authenticated By: Reola Calkins, M.D.   Dg Chest Port 1 View  12/30/2011  *RADIOLOGY REPORT*  Clinical Data: Rapid breathing.  PORTABLE CHEST - 1 VIEW  Comparison: Chest 12/24/2011.   IMPRESSION: Marked worsening of right worse than left airspace disease and right pleural effusion.  Finding could be due to aspiration, edema or pneumonia.  These results will be called to the ordering clinician or representative by the Radiologist Assistant, and communication documented in the PACS Dashboard.  Original Report Authenticated By: Bernadene Bell. Maricela Curet, M.D.    Medications: I have reviewed the patient's current medications.  Pulmonary edema CXR shows pulmonary edema vs pneumonia, patient is already on vancomycin for MRSA bacteremia. Will start Lasix 40 mg iv x 1 dose now, insert foley catheter.  Weakness generalized, failure to thrive:  probably related to infection. Continue with treatment underline infection.   Blood and Urine Cultures positive for MRSA. MRSA Bacteremia.   Infectious disease on board.  Checking 2D echo for positive endocarditis (concerned about pace maker leads).  APPRECIATE ID INPUT!.  Received Levaquin for 4 days now on Vanc   HYPOTHYROIDISM: Continue with synthroid.   Atrial fibrillation: Continue with coumadin per pharmacy. INR 2.6.   DEMENTIA : continue with temazepam, aricept, namenda. ativan, per wife patient has been on both ativan and temazepam without problems with sedation.   Hyponatremia: stable, iv fluids changed to  kvo.  Skin lower back buttock ulcer stage 2: wound care consulted. Following their recommendations.  Suprapubic: Probably related to bladder spasm, UTI. bladder US with 300 cc, foley in place now. Will need to consider remove foley in 24-48 hours for voiding trial. KUB negative for obstruction. Pyridium prn for  Bladder spasm.  CT abdomen showed unusual bladder wall thickening.  Hopefully this is due to UTI.  Once UTI has cleared if bladder pain persists Urology  evaluation would be appropriate.   Disposition:  Patient from home where he lives with his wife.  My understanding is that the wife is also having health problems.  The patient will need significant support at home initially and may require a short term SNF when he is medically ready for discharge.  I    LOS: 6 days   Jerilynn Birkenhead [phone] [facility] 12/30/2011, 10:39 AM

## 2011-12-30 NOTE — Progress Notes (Signed)
ANTIBIOTIC CONSULT NOTE - FOLLOW UP  Pharmacy Consult for Vancomycin/Coumadin Indication: MRSA bacteremia/afib  Allergies  Allergen Reactions  . Penicillins Hives  . Uroxatral (Alfuzosin Hydrochloride) Other (See Comments)    hypotension    Patient Measurements: Height: 5\' 6"  (167.6 cm) Weight: 129 lb 13.6 oz (58.9 kg) IBW/kg (Calculated) : 63.8   Vital Signs: Temp: 97.7 F (36.5 C) (07/18 1007) Temp src: Axillary (07/18 1007) BP: 119/77 mmHg (07/18 1007) Pulse Rate: 78  (07/18 1007) Intake/Output from previous day: 07/17 0701 - 07/18 0700 In: 1615 [P.O.:716; I.V.:550] Out: 600 [Urine:600] Intake/Output from this shift: Total I/O In: -  Out: 300 [Urine:300]  Labs:  [facility] 12/30/11 0917 12/28/11 0532  WBC 16.8* 12.1*  HGB 11.2* 10.5*  PLT 153 111*  LABCREA -- --  CREATININE 0.48* 0.72   Estimated Creatinine Clearance: 54.2 ml/min (by C-G formula based on Cr of 0.48).  Basename 12/30/11 0756  VANCOTROUGH 6.3*  VANCOPEAK --  Drue Dun --  GENTTROUGH --  GENTPEAK --  GENTRANDOM --  TOBRATROUGH --  TOBRAPEAK --  TOBRARND --  AMIKACINPEAK --  AMIKACINTROU --  AMIKACIN --     Microbiology: Recent Results (from the past 720 hour(s))  URINE CULTURE     Status: Normal   Collection Time   12/01/11 11:12 PM      Component Value Range Status Comment   Specimen Description URINE, CLEAN CATCH   Final    Special Requests NONE   Final    Culture  Setup Time [id number]   Final    Colony Count >=100,000 COLONIES/ML   Final    Culture     Final    Value: METHICILLIN RESISTANT STAPHYLOCOCCUS AUREUS     Note: RIFAMPIN AND GENTAMICIN SHOULD NOT BE USED AS SINGLE DRUGS FOR TREATMENT OF STAPH INFECTIONS. CRITICAL RESULT CALLED TO, READ BACK BY AND VERIFIED WITH: REGINA MOORE @ 0226 ON 12/04/2011 HAJAM   Report Status 12/04/2011 FINAL   Final    Organism ID, Bacteria METHICILLIN RESISTANT STAPHYLOCOCCUS AUREUS   Final   URINE CULTURE     Status: Normal   Collection Time   12/24/11  6:43 PM      Component Value Range Status Comment   Specimen Description URINE, CATHETERIZED   Final    Special Requests NONE   Final    Culture  Setup Time 12/25/2011 01:56   Final    Colony Count >=100,000 COLONIES/ML   Final    Culture     Final    Value: METHICILLIN RESISTANT STAPHYLOCOCCUS AUREUS     Note: RIFAMPIN AND GENTAMICIN SHOULD NOT BE USED AS SINGLE DRUGS FOR TREATMENT OF STAPH INFECTIONS.     Note: CRITICAL RESULT CALLED TO, READ BACK BY AND VERIFIED WITH: SIERRA JOHNSON @ 11:26AM  12/28/11 BY DWEEKS   Report Status 12/28/2011 FINAL   Final    Organism ID, Bacteria METHICILLIN RESISTANT STAPHYLOCOCCUS AUREUS   Final   CULTURE, BLOOD (ROUTINE X 2)     Status: Normal   Collection Time   12/27/11  9:10 AM      Component Value Range Status Comment   Specimen Description BLOOD ARM RIGHT   Final    Special Requests BOTTLES DRAWN AEROBIC AND ANAEROBIC 10CC   Final    Culture  Setup Time 12/27/2011 15:49   Final    Culture     Final    Value: STAPHYLOCOCCUS AUREUS     Note: SUSCEPTIBILITIES PERFORMED ON PREVIOUS CULTURE WITHIN THE LAST 5  DAYS.     Note: Gram Stain Report Called to,Read Back By and Verified With: Kennyth Arnold @ 1253 12/28/11 BY KRAWS   Report Status 12/30/2011 FINAL   Final   CULTURE, BLOOD (ROUTINE X 2)     Status: Normal   Collection Time   12/27/11  9:30 AM      Component Value Range Status Comment   Specimen Description BLOOD HAND RIGHT   Final    Special Requests BOTTLES DRAWN AEROBIC AND ANAEROBIC 10CC   Final    Culture  Setup Time 12/27/2011 15:48   Final    Culture     Final    Value: METHICILLIN RESISTANT STAPHYLOCOCCUS AUREUS     Note: RIFAMPIN AND GENTAMICIN SHOULD NOT BE USED AS SINGLE DRUGS FOR TREATMENT OF STAPH INFECTIONS. CRITICAL RESULT CALLED TO, READ BACK BY AND VERIFIED WITH: MICHELLE M. @1400  12/29/11 BY KRAWS This organism DOES NOT demonstrate inducible Clindamycin      resistance in vitro.     Note: Gram Stain  Report Called to,Read Back By and Verified With: Kennyth Arnold @ 1253 12/28/11 BY KRAWS   Report Status 12/30/2011 FINAL   Final    Organism ID, Bacteria METHICILLIN RESISTANT STAPHYLOCOCCUS AUREUS   Final   MRSA PCR SCREENING     Status: Abnormal   Collection Time   12/27/11 10:17 AM      Component Value Range Status Comment   MRSA by PCR POSITIVE (*) NEGATIVE Final     Anti-infectives     Start     Dose/Rate Route Frequency Ordered Stop   12/28/11 1745   rifampin (RIFADIN) capsule 600 mg        600 mg Oral Daily 12/28/11 1744     12/27/11 0900   vancomycin (VANCOCIN) IVPB 1000 mg/200 mL premix        1,000 mg 200 mL/hr over 60 Minutes Intravenous Every 24 hours 12/27/11 0820     12/25/11 2000   levofloxacin (LEVAQUIN) IVPB 500 mg  Status:  Discontinued        500 mg 100 mL/hr over 60 Minutes Intravenous Every 24 hours 12/25/11 0102 12/28/11 0718   12/24/11 1845   levofloxacin (LEVAQUIN) IVPB 750 mg        750 mg 100 mL/hr over 90 Minutes Intravenous  Once 12/24/11 1842 12/24/11 2051          Assessment: 78 y/o male patient receiving day#3 vancomycin for MRSA bacteremia, possible endocarditis. Obtained trough today after 3 doses, level =6.7 , below goal. Renal function stable, good output. Will increase dosing frequency. INR supratherapeutic today and trend up, surprising given patient started on rifampin and this drug likely to induce coumadin metabolism. INR change may be r/t nutritional status. Will hold coumadin.  Goal of Therapy:  Vancomycin trough level 15-20 mcg/ml INR=2-3  Plan:  Change vancomycin to 1g IV q12h. Hold coumadin and f/u daily protime. Measure antibiotic drug levels at steady state  Verlene Mayer, PharmD, BCPS Pager [phone] 12/30/2011,10:21 AM

## 2011-12-30 NOTE — Progress Notes (Signed)
[id number] Spoke with Dr. Sharl Ma regarding patient condition  B/P 92/54  Pulse 84 resp.22 slightly labored. Dr. Sharl Ma in to see patient

## 2011-12-30 NOTE — Progress Notes (Signed)
1478 Spoke with Clerance Lav, PA regarding patient condition also she came in and assess patient, Private sitter at bedside.

## 2011-12-30 NOTE — Evaluation (Signed)
Clinical/Bedside Swallow Evaluation Patient Details  Name: [name] MRN: [id number] Date of Birth: Jun 05, 1923  Today's Date: 12/30/2011 Time: 2841-3244 SLP Time Calculation (min): 26 min  Past Medical History:  Past Medical History  Diagnosis Date  . CHF (congestive heart failure)   . Arthritis   . Pacemaker   . Incontinence   . Pneumonia 2011; 07/29/11  . Neuropathy   . Heart murmur   . Dysrhythmia     paced  . Hypothyroidism   . Restless leg syndrome   . Blood transfusion   . Anemia   . Lower GI bleeding 2011    "from ATB I wasn't suppose to have had ordered"  . Dementia 07/29/11     not observed by this RN  . Atrial fibrillation    Past Surgical History:  Past Surgical History  Procedure Date  . Insert / replace / remove pacemaker ~ 2006    initial placement  . Insert / replace / remove pacemaker 05/2009  . Knee arthroscopy ~ 2000's    right  . Back surgery     "as a kid"  . Inguinal hernia repair 1960's    left  . Inguinal hernia repair 1973    right   HPI:  [name] is an 78 y.o. male who was seen at the the [facility] ED due to complaints of fevers to 101 at home and generalized weakness over the past 24 hours.  He denies any cough, chest pain or SOB, nausea or vomiting or diarrhea or ABD pain.  He was evaluated in the ED and found to have fever and hypotension, and his laboratory studies revealed a positive UA, and a low sodium level of 130.  He was transferred to [facility] for medical  admission.  CXR today shows worsening right airspace disease, possibley secondary to pna or aspiration.    Assessment / Plan / Recommendation Clinical Impression  Pt did not exhibit any overt s/s of aspiration. Swallow appeared timely, voice dry even with large consecutive sips. Pt reamained awake while consuming POs though he drifted off easily when not directly interacting with SLP. Pt is at risk for aspiration given increased weakness poor respiratory status and  lethargy. At this time recommend pt continue current diet with full supervision from staff, upright posture, small sips, meds whole in puree. SLP will follow closely. Please make pt NPO if respiratory rate or lethargy worsens from this point.     Aspiration Risk  Moderate    Diet Recommendation Regular;Thin liquid   Liquid Administration via: Cup;No straw Medication Administration: Whole meds with puree Supervision: Patient able to self feed Compensations: Slow rate;Small sips/bites Postural Changes and/or Swallow Maneuvers: Seated upright 90 degrees    Other  Recommendations Oral Care Recommendations: Oral care QID   Follow Up Recommendations  None    Frequency and Duration min 2x/week  2 weeks   Pertinent Vitals/Pain NA    SLP Swallow Goals Patient will consume recommended diet without observed clinical signs of aspiration with: Minimal assistance Patient will utilize recommended strategies during swallow to increase swallowing safety with: Minimal cueing   Swallow Study Prior Functional Status       General HPI: [name] is an 78 y.o. male who was seen at the the [facility] ED due to complaints of fevers to 101 at home and generalized weakness over the past 24 hours.  He denies any cough, chest pain or SOB, nausea or vomiting or diarrhea or ABD  pain.  He was evaluated in the ED and found to have fever and hypotension, and his laboratory studies revealed a positive UA, and a low sodium level of 130.  He was transferred to [facility] for medical  admission.  CXR today shows worsening right airspace disease, possibley secondary to pna or aspiration.  Type of Study: Bedside swallow evaluation Diet Prior to this Study: Regular;Thin liquids Temperature Spikes Noted: Yes Respiratory Status: Supplemental O2 delivered via (comment) Behavior/Cognition: Lethargic;Cooperative;Pleasant mood Oral Cavity - Dentition: Adequate natural dentition Self-Feeding Abilities: Needs assist Patient  Positioning: Upright in bed Baseline Vocal Quality: Clear Volitional Cough: Strong Volitional Swallow: Able to elicit    Oral/Motor/Sensory Function Overall Oral Motor/Sensory Function: Appears within functional limits for tasks assessed   Ice Chips Ice chips: Within functional limits   Thin Liquid Thin Liquid: Within functional limits    Nectar Thick Nectar Thick Liquid: Not tested   Honey Thick Honey Thick Liquid: Not tested   Puree Puree: Within functional limits   Solid Solid: Within functional limits    [name], [name] 12/30/2011,3:05 PM

## 2011-12-30 NOTE — Progress Notes (Signed)
1420 Debbie ,RN from rapid response here to assess patient . Also Speech therapy is here doing his bedside swallow.

## 2011-12-30 NOTE — Progress Notes (Signed)
1610 Foley catheter discontinued per order. Patient  Sleepy but easily aroused. P.Chantrice Hagg,RN

## 2011-12-30 NOTE — Progress Notes (Signed)
PT Cancellation Note  Treatment cancelled today due to patient receiving procedure or test. Ultrasound and unable to participate at this time. Will attempt later as time allows.  Delaney Meigs, PT [phone]

## 2011-12-30 NOTE — Progress Notes (Signed)
Results paged to PA [facility]

## 2011-12-31 ENCOUNTER — Encounter (HOSPITAL_COMMUNITY): Payer: Self-pay | Admitting: *Deleted

## 2011-12-31 ENCOUNTER — Inpatient Hospital Stay (HOSPITAL_COMMUNITY): Payer: Medicare Other

## 2011-12-31 DIAGNOSIS — R7881 Bacteremia: Secondary | ICD-10-CM

## 2011-12-31 DIAGNOSIS — A4901 Methicillin susceptible Staphylococcus aureus infection, unspecified site: Secondary | ICD-10-CM

## 2011-12-31 LAB — COMPREHENSIVE METABOLIC PANEL
ALT: 23 U/L (ref 0–53)
AST: 25 U/L (ref 0–37)
Alkaline Phosphatase: 132 U/L — ABNORMAL HIGH (ref 39–117)
CO2: 24 mEq/L (ref 19–32)
Calcium: 7.7 mg/dL — ABNORMAL LOW (ref 8.4–10.5)
GFR calc Af Amer: >90 mL/min (ref >90)
GFR calc non Af Amer: >90 mL/min (ref >90)
Glucose, Bld: 122 mg/dL — ABNORMAL HIGH (ref 70–99)
Potassium: 3.4 mEq/L — ABNORMAL LOW (ref 3.5–5.1)
Sodium: 129 mEq/L — ABNORMAL LOW (ref 135–145)
Total Protein: 5.5 g/dL — ABNORMAL LOW (ref 6.0–8.3)

## 2011-12-31 LAB — CBC
Hemoglobin: 11.3 g/dL — ABNORMAL LOW (ref 13.0–17.0)
Platelets: 204 10*3/uL (ref 150–400)
RBC: 3.55 MIL/uL — ABNORMAL LOW (ref 4.22–5.81)

## 2011-12-31 LAB — PROTIME-INR
INR: 3.64 — ABNORMAL HIGH (ref 0.00–1.49)
Prothrombin Time: 36.8 seconds — ABNORMAL HIGH (ref 11.6–15.2)

## 2011-12-31 MED ORDER — SODIUM CHLORIDE 0.9 % IJ SOLN
10.0000 mL | Freq: Two times a day (BID) | INTRAMUSCULAR | Status: DC
  Administered 2012-01-01 – 2012-01-04 (×2): 10 mL

## 2011-12-31 MED ORDER — CYCLOBENZAPRINE HCL 10 MG PO TABS
10.0000 mg | ORAL_TABLET | Freq: Once | ORAL | Status: AC
  Administered 2011-12-31: 10 mg via ORAL
  Filled 2011-12-31 (×2): qty 1

## 2011-12-31 MED ORDER — FUROSEMIDE 40 MG PO TABS
40.0000 mg | ORAL_TABLET | Freq: Every day | ORAL | Status: DC
  Administered 2011-12-31 – 2012-01-05 (×6): 40 mg via ORAL
  Filled 2011-12-31 (×6): qty 1

## 2011-12-31 MED ORDER — TEMAZEPAM 15 MG PO CAPS: 15.0000 mg | ORAL_CAPSULE | Freq: Once | ORAL | Status: DC

## 2011-12-31 MED ORDER — POTASSIUM CHLORIDE CRYS ER 20 MEQ PO TBCR
20.0000 meq | EXTENDED_RELEASE_TABLET | Freq: Two times a day (BID) | ORAL | Status: AC
  Administered 2011-12-31 – 2012-01-01 (×4): 20 meq via ORAL
  Filled 2011-12-31 (×4): qty 1

## 2011-12-31 MED ORDER — SODIUM CHLORIDE 0.9 % IJ SOLN
10.0000 mL | INTRAMUSCULAR | Status: DC | PRN
  Administered 2012-01-01 – 2012-01-05 (×3): 10 mL

## 2011-12-31 MED ORDER — TEMAZEPAM 15 MG PO CAPS
15.0000 mg | ORAL_CAPSULE | Freq: Once | ORAL | Status: AC | PRN
  Filled 2011-12-31: qty 1

## 2011-12-31 NOTE — Progress Notes (Signed)
Speech Language Pathology Dysphagia Treatment Patient Details Name: [name] MRN: [id number] DOB: 10-02-1922 Today's Date: 12/31/2011 Time: 8657-8469 SLP Time Calculation (min): 21 min  Assessment / Plan / Recommendation Clinical Impression  Pt more alert this am, awake on SLP arrival requesting help to sit up and have some water. Pt with no overt s/s of aspiration as seen yesterday. Some piecemeal deglutition of larger thin liquids boluses. SLP provided poderate verbal and tactile cues for pt to take small single sips. Suspect aspiraition risk increased due to pts shortness of breath. Educated pt on this rationale. SLP will continue to follow for tolerance and education.     Diet Recommendation  Continue with Current Diet: Regular;Thin liquid    SLP Plan Continue with current plan of care   Pertinent Vitals/Pain NA   Swallowing Goals  SLP Swallowing Goals Patient will consume recommended diet without observed clinical signs of aspiration with: Minimal assistance Swallow Study Goal #1 - Progress: Progressing toward goal Patient will utilize recommended strategies during swallow to increase swallowing safety with: Minimal cueing Swallow Study Goal #2 - Progress: Progressing toward goal  General Temperature Spikes Noted: No Respiratory Status: Supplemental O2 delivered via (comment) Behavior/Cognition: Alert;Cooperative;Pleasant mood Oral Cavity - Dentition: Adequate natural dentition Patient Positioning: Upright in bed  Oral Cavity - Oral Hygiene     Dysphagia Treatment Treatment focused on: Skilled observation of diet tolerance Treatment Methods/Modalities: Skilled observation Patient observed directly with PO's: Yes Type of PO's observed: Thin liquids Feeding: Needs assist Liquids provided via: Cup;No straw Pharyngeal Phase Signs & Symptoms: Multiple swallows   GO     Arine Foley, Riley Nearing 12/31/2011, 8:05 AM

## 2011-12-31 NOTE — Progress Notes (Signed)
Nutrition Follow-up  Intervention:   1. Continue Ensure Complete PO BID to support PO intake 2. Recommend liberalized diet to Regular to increase intake options 3. RD to continue to follow and make recommendations in accordance with goals of care  Assessment:   MRSA positive. Pt transferred to step-down on 7/18 for decreased level of consciousness and blood pressure.  SLP saw pt this morning and recommending continuation of Regular diet consistency.  Discussed intake with sitter. Sitter noted pt's attention was decreased yesterday and intake was minimal. Today, pt has eaten a few bites of items off tray, and has consumed half an Ensure Complete so far today. Consumed an entire Ensure Complete yesterday.  PMT meeting confirmed for later today.  Diet Order:  Heart Healthy Supplements: Ensure Complete PO BID  Meds: Scheduled Meds:   . Chlorhexidine Gluconate Cloth  6 each Topical Q0600  . donepezil  5 mg Oral QHS  . feeding supplement  237 mL Oral BID BM  . furosemide  40 mg Oral Daily  . levothyroxine  88 mcg Oral QAC breakfast  . LORazepam  1 mg Oral QHS  . memantine  10 mg Oral BID  . mupirocin ointment  1 application Nasal BID  . omega-3 acid ethyl esters  1 g Oral BID  . phenazopyridine  100 mg Oral TID WC  . polyethylene glycol  17 g Oral Daily  . rifampin  600 mg Oral Daily  . silver sulfADIAZINE  1 application Topical Daily  . sodium chloride  3 mL Intravenous Q12H  . vancomycin  1,000 mg Intravenous Q12H  . vitamin C  500 mg Oral Daily  . Warfarin - Pharmacist Dosing Inpatient   Does not apply q1800   Continuous Infusions:   . sodium chloride 20 mL/hr (12/31/11 0545)   PRN Meds:.acetaminophen, acetaminophen, alum & mag hydroxide-simeth, HYDROmorphone (DILAUDID) injection, ondansetron (ZOFRAN) IV, ondansetron, oxyCODONE  Labs:  CMP     Component Value Date/Time   NA 129* 12/31/2011 1034   K 3.4* 12/31/2011 1034   CL 95* 12/31/2011 1034   CO2 24 12/31/2011 1034   GLUCOSE 122* 12/31/2011 1034   BUN 16 12/31/2011 1034   CREATININE 0.47* 12/31/2011 1034   CALCIUM 7.7* 12/31/2011 1034   PROT 5.5* 12/31/2011 1034   ALBUMIN 2.4* 12/31/2011 1034   AST 25 12/31/2011 1034   ALT 23 12/31/2011 1034   ALKPHOS 132* 12/31/2011 1034   BILITOT 1.2 12/31/2011 1034   GFRNONAA >90 12/31/2011 1034   GFRAA >90 12/31/2011 1034     Intake/Output Summary (Last 24 hours) at 12/31/11 1151 Last data filed at 12/31/11 0900  Gross per 24 hour  Intake    860 ml  Output   2100 ml  Net  -1240 ml    Weight Status:  58.9 kg/129 lb - wt up 8 lb x 4 days  Estimated needs:  1280 - 1500 kcal, 65 - 75 grams protein, 1.5 - 1.8 liters daily  Nutrition Dx:  Inadequate oral intake r/t dementia and acute illness AEB <50% of estimated energy intake for at least 5 days and 12.3% wt loss of usual body weight. Ongoing.  Goal:  Pt to meet >/= 90% of their estimated nutrition needs - unmet  Monitor:  PO intake, weights, labs, I/O's, GOC  Jarold Motto MS, RD, LDN Pager: [phone] After-hours pager: [phone]

## 2011-12-31 NOTE — Progress Notes (Signed)
Peripherally Inserted Central Catheter/Midline Placement  The IV Nurse has discussed with the patient and/or persons authorized to consent for the patient, the purpose of this procedure and the potential benefits and risks involved with this procedure.  The benefits include less needle sticks, lab draws from the catheter and patient may be discharged home with the catheter.  Risks include, but not limited to, infection, bleeding, blood clot (thrombus formation), and puncture of an artery; nerve damage and irregular heat beat.  Alternatives to this procedure were also discussed.  PICC/Midline Placement Documentation        Stacie Glaze Horton 12/31/2011, 4:06 PM

## 2011-12-31 NOTE — Progress Notes (Signed)
Attempt to restart iv for routine restart unsuccessful x2 .will call iv team to assess.Bonner Larue rn

## 2011-12-31 NOTE — Progress Notes (Signed)
ANTIBIOTIC CONSULT NOTE - FOLLOW UP  Pharmacy Consult for Vancomycin/Coumadin Indication: MRSA bacteremia/afib  Allergies  Allergen Reactions  . Penicillins Hives  . Uroxatral (Alfuzosin Hydrochloride) Other (See Comments)    hypotension    Patient Measurements: Height: 5\' 6"  (167.6 cm) Weight: 129 lb 13.6 oz (58.9 kg) IBW/kg (Calculated) : 63.8   Vital Signs: Temp: 98.5 F (36.9 C) (07/19 0823) Temp src: Oral (07/19 0823) BP: 103/56 mmHg (07/19 0830) Pulse Rate: 75  (07/19 0830) Intake/Output from previous day: 07/18 0701 - 07/19 0700 In: 1180 [P.O.:760; I.V.:220; IV Piggyback:200] Out: 2650 [Urine:2650] Intake/Output from this shift:    Labs:  [facility] 12/30/11 0917  WBC 16.8*  HGB 11.2*  PLT 153  LABCREA --  CREATININE 0.48*   Estimated Creatinine Clearance: 54.2 ml/min (by C-G formula based on Cr of 0.48).  Basename 12/30/11 0756  VANCOTROUGH 6.3*  VANCOPEAK --  Drue Dun --  GENTTROUGH --  GENTPEAK --  GENTRANDOM --  TOBRATROUGH --  TOBRAPEAK --  TOBRARND --  AMIKACINPEAK --  AMIKACINTROU --  AMIKACIN --     Microbiology: Recent Results (from the past 720 hour(s))  URINE CULTURE     Status: Normal   Collection Time   12/01/11 11:12 PM      Component Value Range Status Comment   Specimen Description URINE, CLEAN CATCH   Final    Special Requests NONE   Final    Culture  Setup Time [id number]   Final    Colony Count >=100,000 COLONIES/ML   Final    Culture     Final    Value: METHICILLIN RESISTANT STAPHYLOCOCCUS AUREUS     Note: RIFAMPIN AND GENTAMICIN SHOULD NOT BE USED AS SINGLE DRUGS FOR TREATMENT OF STAPH INFECTIONS. CRITICAL RESULT CALLED TO, READ BACK BY AND VERIFIED WITH: REGINA MOORE @ 0226 ON 12/04/2011 HAJAM   Report Status 12/04/2011 FINAL   Final    Organism ID, Bacteria METHICILLIN RESISTANT STAPHYLOCOCCUS AUREUS   Final   URINE CULTURE     Status: Normal   Collection Time   12/24/11  6:43 PM      Component Value Range Status  Comment   Specimen Description URINE, CATHETERIZED   Final    Special Requests NONE   Final    Culture  Setup Time 12/25/2011 01:56   Final    Colony Count >=100,000 COLONIES/ML   Final    Culture     Final    Value: METHICILLIN RESISTANT STAPHYLOCOCCUS AUREUS     Note: RIFAMPIN AND GENTAMICIN SHOULD NOT BE USED AS SINGLE DRUGS FOR TREATMENT OF STAPH INFECTIONS.     Note: CRITICAL RESULT CALLED TO, READ BACK BY AND VERIFIED WITH: SIERRA JOHNSON @ 11:26AM  12/28/11 BY DWEEKS   Report Status 12/28/2011 FINAL   Final    Organism ID, Bacteria METHICILLIN RESISTANT STAPHYLOCOCCUS AUREUS   Final   CULTURE, BLOOD (ROUTINE X 2)     Status: Normal   Collection Time   12/27/11  9:10 AM      Component Value Range Status Comment   Specimen Description BLOOD ARM RIGHT   Final    Special Requests BOTTLES DRAWN AEROBIC AND ANAEROBIC 10CC   Final    Culture  Setup Time 12/27/2011 15:49   Final    Culture     Final    Value: STAPHYLOCOCCUS AUREUS     Note: SUSCEPTIBILITIES PERFORMED ON PREVIOUS CULTURE WITHIN THE LAST 5 DAYS.     Note: Gram Stain Report Called  to,Read Back By and Verified With: Kennyth Arnold @ 1253 12/28/11 BY KRAWS   Report Status 12/30/2011 FINAL   Final   CULTURE, BLOOD (ROUTINE X 2)     Status: Normal   Collection Time   12/27/11  9:30 AM      Component Value Range Status Comment   Specimen Description BLOOD HAND RIGHT   Final    Special Requests BOTTLES DRAWN AEROBIC AND ANAEROBIC 10CC   Final    Culture  Setup Time 12/27/2011 15:48   Final    Culture     Final    Value: METHICILLIN RESISTANT STAPHYLOCOCCUS AUREUS     Note: RIFAMPIN AND GENTAMICIN SHOULD NOT BE USED AS SINGLE DRUGS FOR TREATMENT OF STAPH INFECTIONS. CRITICAL RESULT CALLED TO, READ BACK BY AND VERIFIED WITH: MICHELLE M. @1400  12/29/11 BY KRAWS This organism DOES NOT demonstrate inducible Clindamycin      resistance in vitro.     Note: Gram Stain Report Called to,Read Back By and Verified With: Kennyth Arnold @ 1253  12/28/11 BY KRAWS   Report Status 12/30/2011 FINAL   Final    Organism ID, Bacteria METHICILLIN RESISTANT STAPHYLOCOCCUS AUREUS   Final   MRSA PCR SCREENING     Status: Abnormal   Collection Time   12/27/11 10:17 AM      Component Value Range Status Comment   MRSA by PCR POSITIVE (*) NEGATIVE Final   CULTURE, BLOOD (ROUTINE X 2)     Status: Normal (Preliminary result)   Collection Time   12/29/11 11:26 PM      Component Value Range Status Comment   Specimen Description BLOOD RIGHT ARM   Final    Special Requests BOTTLES DRAWN AEROBIC AND ANAEROBIC 10CC   Final    Culture  Setup Time 12/30/2011 04:13   Final    Culture     Final    Value:        BLOOD CULTURE RECEIVED NO GROWTH TO DATE CULTURE WILL BE HELD FOR 5 DAYS BEFORE ISSUING A FINAL NEGATIVE REPORT   Report Status PENDING   Incomplete   CULTURE, BLOOD (ROUTINE X 2)     Status: Normal (Preliminary result)   Collection Time   12/29/11 11:27 PM      Component Value Range Status Comment   Specimen Description BLOOD RIGHT HAND   Final    Special Requests BOTTLES DRAWN AEROBIC AND ANAEROBIC 10CC   Final    Culture  Setup Time 12/30/2011 04:13   Final    Culture     Final    Value:        BLOOD CULTURE RECEIVED NO GROWTH TO DATE CULTURE WILL BE HELD FOR 5 DAYS BEFORE ISSUING A FINAL NEGATIVE REPORT   Report Status PENDING   Incomplete     Anti-infectives     Start     Dose/Rate Route Frequency Ordered Stop   12/30/11 2000   vancomycin (VANCOCIN) IVPB 1000 mg/200 mL premix        1,000 mg 200 mL/hr over 60 Minutes Intravenous Every 12 hours 12/30/11 1034     12/28/11 1745   rifampin (RIFADIN) capsule 600 mg        600 mg Oral Daily 12/28/11 1744     12/27/11 0900   vancomycin (VANCOCIN) IVPB 1000 mg/200 mL premix  Status:  Discontinued        1,000 mg 200 mL/hr over 60 Minutes Intravenous Every 24 hours 12/27/11 0820 12/30/11 1034  12/25/11 2000   levofloxacin (LEVAQUIN) IVPB 500 mg  Status:  Discontinued        500 mg 100  mL/hr over 60 Minutes Intravenous Every 24 hours 12/25/11 0102 12/28/11 0718   12/24/11 1845   levofloxacin (LEVAQUIN) IVPB 750 mg        750 mg 100 mL/hr over 90 Minutes Intravenous  Once 12/24/11 1842 12/24/11 2051          Assessment: 78 y/o male patient receiving day#4 vancomycin for MRSA bacteremia, possible endocarditis. Trough subtherapeutic yesterday and dose has been adjusted. INR trending down but still supratherapeutic.   Goal of Therapy:  Vancomycin trough level 15-20 mcg/ml INR=2-3  Plan:  Vanc 1g IV q12 Hold coumadin today

## 2011-12-31 NOTE — Progress Notes (Signed)
Physical Therapy Treatment Patient Details Name: [name] MRN: [id number] DOB: Feb 26, 1923 Today's Date: 12/31/2011 Time: 4098-1191 PT Time Calculation (min): 40 min  PT Assessment / Plan / Recommendation Comments on Treatment Session  Pt reports he is feeling better today than yesterday.  Willing to participate in therapy.  Increased tx time due to pt incontinent of bowels when standing.  At this point, pt is progressing slowly with PT goals & mobility.  Pt does have 24 hr care but he may benefit from SNF to maximize mobility.      Follow Up Recommendations  Home health PT;Supervision/Assistance - 24 hour    Barriers to Discharge        Equipment Recommendations  None recommended by OT    Recommendations for Other Services    Frequency Min 3X/week   Plan Discharge plan remains appropriate;Frequency remains appropriate    Precautions / Restrictions Precautions Precautions: Fall Restrictions Weight Bearing Restrictions: No     Pertinent Vitals/Pain Pt on 2L before & after session.  02 sats remained >90% without supplemental 02.      Mobility  Bed Mobility Bed Mobility: Supine to Sit;Sitting - Scoot to Edge of Bed Supine to Sit: 3: Mod assist;With rails;HOB elevated Supine to Sit: Patient Percentage: 60% Sitting - Scoot to Edge of Bed: 3: Mod assist (use of draw pad) Details for Bed Mobility Assistance: Cues for technique.  Assist to lift shoulders/trunk to sitting upright & use of draw pad to bring hips closer to EOB.   Transfers Transfers: Sit to Stand;Stand to Sit Sit to Stand: 3: Mod assist;With upper extremity assist;With armrests;From bed;From chair/3-in-1 Sit to Stand: Patient Percentage: 50% Stand to Sit: 3: Mod assist;With upper extremity assist;With armrests;To chair/3-in-1 Stand to Sit: Patient Percentage: 50% Details for Transfer Assistance: Cues for hand placement & technique.  Assist to achieve standing, anterior weight shifting, balance, controlled  descent, & safety.  Pt with wide BOS, posterior lean, weight shifted towards Lt, & trunk flexed.   Ambulation/Gait Ambulation/Gait Assistance: 3: Mod assist Ambulation Distance (Feet):  (5 steps forwards) Assistive device: Rolling walker Ambulation/Gait Assistance Details: distance limited due to pt incontinent of bowels.  Assist for balance, RW management, & safety.  cues for sequencing & technique.   Gait Pattern: Decreased step length - right;Decreased step length - left;Wide base of support;Trunk flexed;Shuffle Stairs: No Wheelchair Mobility Wheelchair Mobility: No    Exercises      PT Goals Acute Rehab PT Goals Time For Goal Achievement: 01/02/12 PT Goal: Supine/Side to Sit - Progress: Progressing toward goal PT Goal: Sit to Stand - Progress: Progressing toward goal PT Goal: Stand to Sit - Progress: Progressing toward goal PT Goal: Ambulate - Progress: Progressing toward goal  Visit Information  Last PT Received On: 12/31/11 Assistance Needed: +2    Subjective Data      Cognition  Overall Cognitive Status: History of cognitive impairments - at baseline Behavior During Session: [facility] for tasks performed    Balance  Balance Balance Assessed: Yes Static Standing Balance Static Standing - Balance Support: Bilateral upper extremity supported Static Standing - Level of Assistance: 3: Mod assist Static Standing - Comment/# of Minutes: Pt stood with Bil UE supported on RW while caregiver providing pericare.  Required Mod assist for balance.  Tolerated standing x~3-4 mins x 2 trials.    End of Session PT - End of Session Equipment Utilized During Treatment: Gait belt Activity Tolerance: Other (comment);Patient limited by fatigue (incontinence of bowel) Patient left: in  chair;with call bell/phone within reach;with family/visitor present Community education officer) Nurse Communication: Mobility status     Verdell Face, [place] [phone] 12/31/2011

## 2011-12-31 NOTE — Progress Notes (Signed)
Patient ID: [name], male   DOB: 1922-11-19, 78 y.o.   MRN: [id number]   Subjective: Patient is breathing much better after diuresis with iv lasix. More alert and communicating, 2 d echo shows no vegetations. He has EF of 25%  Objective: Filed Vitals:   12/31/11 0800 12/31/11 0823 12/31/11 0830 12/31/11 0900  BP: 92/53 92/53 103/56 103/56  Pulse: 75 78 75 73  Temp:  98.5 F (36.9 C)    TempSrc:  Oral    Resp: 20 22 20 25   Height:      Weight:      SpO2: 96% 98% 97% 97%   Weight change:    General: Appears in mild distress HEENT: No bruits, no goiter.  Heart: S1S2 RRR Lungs: Clear bilaterally Abdomen: Soft, minimal distention, positive bowel sounds, non tender to palpation Neuro: Alert oriented x 3 Extremities; no edema.   Lab Results:  [facility] 12/30/11 0917  NA 130*  K 3.8  CL 99  CO2 23  GLUCOSE 119*  BUN 19  CREATININE 0.48*  CALCIUM 8.0*  MG --  PHOS --    Basename 12/30/11 0917  WBC 16.8*  NEUTROABS --  HGB 11.2*  HCT 31.1*  MCV 88.6  PLT 153    Micro Results: Recent Results (from the past 240 hour(s))  URINE CULTURE     Status: Normal   Collection Time   12/24/11  6:43 PM      Component Value Range Status Comment   Specimen Description URINE, CATHETERIZED   Final    Special Requests NONE   Final    Culture  Setup Time 12/25/2011 01:56   Final    Colony Count >=100,000 COLONIES/ML   Final    Culture     Final    Value: METHICILLIN RESISTANT STAPHYLOCOCCUS AUREUS     Note: RIFAMPIN AND GENTAMICIN SHOULD NOT BE USED AS SINGLE DRUGS FOR TREATMENT OF STAPH INFECTIONS.     Note: CRITICAL RESULT CALLED TO, READ BACK BY AND VERIFIED WITH: SIERRA JOHNSON @ 11:26AM  12/28/11 BY DWEEKS   Report Status 12/28/2011 FINAL   Final    Organism ID, Bacteria METHICILLIN RESISTANT STAPHYLOCOCCUS AUREUS   Final   CULTURE, BLOOD (ROUTINE X 2)     Status: Normal   Collection Time   12/27/11  9:10 AM      Component Value Range Status Comment   Specimen  Description BLOOD ARM RIGHT   Final    Special Requests BOTTLES DRAWN AEROBIC AND ANAEROBIC 10CC   Final    Culture  Setup Time 12/27/2011 15:49   Final    Culture     Final    Value: STAPHYLOCOCCUS AUREUS     Note: SUSCEPTIBILITIES PERFORMED ON PREVIOUS CULTURE WITHIN THE LAST 5 DAYS.     Note: Gram Stain Report Called to,Read Back By and Verified With: Kennyth Arnold @ 1253 12/28/11 BY KRAWS   Report Status 12/30/2011 FINAL   Final   CULTURE, BLOOD (ROUTINE X 2)     Status: Normal   Collection Time   12/27/11  9:30 AM      Component Value Range Status Comment   Specimen Description BLOOD HAND RIGHT   Final    Special Requests BOTTLES DRAWN AEROBIC AND ANAEROBIC 10CC   Final    Culture  Setup Time 12/27/2011 15:48   Final    Culture     Final    Value: METHICILLIN RESISTANT STAPHYLOCOCCUS AUREUS     Note: RIFAMPIN  AND GENTAMICIN SHOULD NOT BE USED AS SINGLE DRUGS FOR TREATMENT OF STAPH INFECTIONS. CRITICAL RESULT CALLED TO, READ BACK BY AND VERIFIED WITH: MICHELLE M. @1400  12/29/11 BY KRAWS This organism DOES NOT demonstrate inducible Clindamycin      resistance in vitro.     Note: Gram Stain Report Called to,Read Back By and Verified With: Kennyth Arnold @ 1253 12/28/11 BY KRAWS   Report Status 12/30/2011 FINAL   Final    Organism ID, Bacteria METHICILLIN RESISTANT STAPHYLOCOCCUS AUREUS   Final   MRSA PCR SCREENING     Status: Abnormal   Collection Time   12/27/11 10:17 AM      Component Value Range Status Comment   MRSA by PCR POSITIVE (*) NEGATIVE Final   CULTURE, BLOOD (ROUTINE X 2)     Status: Normal (Preliminary result)   Collection Time   12/29/11 11:26 PM      Component Value Range Status Comment   Specimen Description BLOOD RIGHT ARM   Final    Special Requests BOTTLES DRAWN AEROBIC AND ANAEROBIC 10CC   Final    Culture  Setup Time 12/30/2011 04:13   Final    Culture     Final    Value:        BLOOD CULTURE RECEIVED NO GROWTH TO DATE CULTURE WILL BE HELD FOR 5 DAYS BEFORE  ISSUING A FINAL NEGATIVE REPORT   Report Status PENDING   Incomplete   CULTURE, BLOOD (ROUTINE X 2)     Status: Normal (Preliminary result)   Collection Time   12/29/11 11:27 PM      Component Value Range Status Comment   Specimen Description BLOOD RIGHT HAND   Final    Special Requests BOTTLES DRAWN AEROBIC AND ANAEROBIC 10CC   Final    Culture  Setup Time 12/30/2011 04:13   Final    Culture     Final    Value:        BLOOD CULTURE RECEIVED NO GROWTH TO DATE CULTURE WILL BE HELD FOR 5 DAYS BEFORE ISSUING A FINAL NEGATIVE REPORT   Report Status PENDING   Incomplete     Studies/Results: Ct Abdomen Pelvis W Contrast  12/28/2011  *RADIOLOGY REPORT*  Clinical Data: Lower abdominal pain.  CT ABDOMEN AND PELVIS WITH CONTRAST   IMPRESSION:  1.  Periportal edema in the liver and possible early cirrhotic changes of the liver. 2.  Evidence of right heart failure with reflux of contrast into the IVC and hepatic veins. 3.  Scattered small amount of ascites throughout the peritoneal cavity without evidence of mass lesions or enlarged lymph nodes. 4.  Significant irregular wall thickening of the bladder.  This may relate to chronic outlet obstruction.  Other bladder pathology such as tumor or cystitis cannot be excluded.  Original Report Authenticated By: Reola Calkins, M.D.   Dg Chest Port 1 View  12/30/2011  *RADIOLOGY REPORT*  Clinical Data: Rapid breathing.  PORTABLE CHEST - 1 VIEW  Comparison: Chest 12/24/2011.   IMPRESSION: Marked worsening of right worse than left airspace disease and right pleural effusion.  Finding could be due to aspiration, edema or pneumonia.  These results will be called to the ordering clinician or representative by the Radiologist Assistant, and communication documented in the PACS Dashboard.  Original Report Authenticated By: Bernadene Bell. Maricela Curet, M.D.    Medications: I have reviewed the patient's current medications.  Pulmonary edema Resolved Will start him on po lasix  40 mg daily  Weakness generalized,  failure to thrive:  probably related to infection. Continue with treatment underline infection.   Blood and Urine Cultures positive for MRSA. MRSA Bacteremia.   Infectious disease on board. Last blood cultures have been negative 2 D echo did not show vegetations. Will get cardiology consult for further recommendations for possible TEE Will order picc line for 6-8 weeks of antibiotics as per ID recommendations. D/w Dr Orvan Falconer.  HYPOTHYROIDISM:  Continue with synthroid.   Orange colored urine He is on rifampin and pyridium , both of which change the urine color.  Atrial fibrillation:  Continue with coumadin per pharmacy.   DEMENTIA :  continue with temazepam, aricept, namenda. ativan, per wife patient has been on both ativan and temazepam without problems with sedation.   Hyponatremia: resolved  iv fluids changed to kvo.  Skin lower back buttock ulcer stage 2:  wound care consulted.  Following their recommendations.  Suprapubic pain: Probably related to bladder spasm, UTI. bladder US with 300 cc, foley in place now. Will need to consider remove foley in 24-48 hours for voiding trial. KUB negative for obstruction. Pyridium prn for  Bladder spasm.  CT abdomen showed unusual bladder wall thickening.  Hopefully this is due to UTI.  Once UTI has cleared if bladder pain persists Urology evaluation would be appropriate.   Disposition:  Patient from home where he lives with his wife.  My understanding is that the wife is also having health problems.  The patient will need significant support at home initially and may require a short term SNF when he is medically ready for discharge.  I    LOS: 7 days   Jerilynn Birkenhead [phone] [facility] 12/31/2011, 9:06 AM

## 2011-12-31 NOTE — Consult Note (Signed)
Admit date: 12/24/2011 Referring Physician  Dr. Orvan Falconer Primary Physician Allean Found, MD Primary Cardiologist  Dr, Katrinka Blazing Reason for Consultation  Question TEE, MRSA bacteremia.   HPI: 78 year old male patient of Dr. Michaelle Copas with pacemaker, mitral regurgitation, coronary artery disease, history of atrial fibrillation, decreased ejection fraction with MRSA bacteremia followed by infectious disease, Dr. Orvan Falconer.  When I will went to interview [name] he was being assisted up by physical therapy and slowly proceeding forward with assistance.  He denies any chest pain or shortness of breath    PMH:   Past Medical History  Diagnosis Date  . CHF (congestive heart failure)   . Arthritis   . Pacemaker   . Incontinence   . Pneumonia 2011; 07/29/11  . Neuropathy   . Heart murmur   . Dysrhythmia     paced  . Hypothyroidism   . Restless leg syndrome   . Blood transfusion   . Anemia   . Lower GI bleeding 2011    "from ATB I wasn't suppose to have had ordered"  . Dementia 07/29/11     not observed by this RN  . Atrial fibrillation     PSH:   Past Surgical History  Procedure Date  . Insert / replace / remove pacemaker ~ 2006    initial placement  . Insert / replace / remove pacemaker 05/2009  . Knee arthroscopy ~ 2000's    right  . Back surgery     "as a kid"  . Inguinal hernia repair 1960's    left  . Inguinal hernia repair 1973    right   Allergies:  Penicillins and Uroxatral Prior to Admit Meds:   Prescriptions prior to admission  Medication Sig Dispense Refill  . Calcium Carbonate-Vitamin D (CALCIUM 600/VITAMIN D PO) Take 1 tablet by mouth 2 (two) times daily.       Marland Kitchen donepezil (ARICEPT) 5 MG tablet Take 5 mg by mouth at bedtime.       Marland Kitchen HYDROcodone-acetaminophen (LORTAB) 10-500 MG per tablet Take 1 tablet by mouth every 6 (six) hours as needed. For pain.      Marland Kitchen levothyroxine (SYNTHROID, LEVOTHROID) 88 MCG tablet Take 88 mcg by mouth every morning.      Marland Kitchen  LORazepam (ATIVAN) 0.5 MG tablet Take 1 mg by mouth at bedtime. For anxiety      . memantine (NAMENDA) 10 MG tablet Take 10 mg by mouth 2 (two) times daily.      . mirabegron ER (MYRBETRIQ) 25 MG TB24 Take 25 mg by mouth daily.      Marland Kitchen omega-3 acid ethyl esters (LOVAZA) 1 G capsule Take 1 g by mouth 2 (two) times daily.       Bertram Gala Glycol-Propyl Glycol (SYSTANE) 0.4-0.3 % SOLN Apply to eye.      . silver sulfADIAZINE (SILVADENE) 1 % cream Apply 1 application topically daily. Applies to rear      . vitamin C (ASCORBIC ACID) 500 MG tablet Take 500 mg by mouth daily.       Marland Kitchen warfarin (COUMADIN) 5 MG tablet Take 5 mg by mouth daily. Takes 1 tablet daily on mondays and wednesdays      . cyclobenzaprine (FLEXERIL) 10 MG tablet Take 10 mg by mouth at bedtime.      . fentaNYL (DURAGESIC - DOSED MCG/HR) 25 MCG/HR Place 1 patch onto the skin every 3 (three) days.      Marland Kitchen spironolactone (ALDACTONE) 25 MG tablet Take 12.5 mg by  mouth daily.      . temazepam (RESTORIL) 15 MG capsule Take 15 mg by mouth at bedtime.      Marland Kitchen warfarin (COUMADIN) 1 MG tablet Take 6 mg by mouth See admin instructions. Takes 6mg  tablet daily except on mondays and wednesdays       Fam HX:   No family history on file. Social HX:    History   Social History  . Marital Status: Married    Spouse Name: N/A    Number of Children: N/A  . Years of Education: N/A   Occupational History  . Not on file.   Social History Main Topics  . Smoking status: Former Smoker -- 2.0 packs/day for 15 years    Types: Cigarettes    Quit date: 06/15/1971  . Smokeless tobacco: Never Used  . Alcohol Use: No     07/29/11 "used to drink some; haven't now in quite a few years"  . Drug Use: No  . Sexually Active: Not Currently   Other Topics Concern  . Not on file   Social History Narrative  . No narrative on file     ROS:  All 11 ROS were addressed and are negative except what is stated in the HPI  Physical Exam: Blood pressure 103/56,  pulse 73, temperature 98.5 F (36.9 C), temperature source Oral, resp. rate 25, height 5\' 6"  (1.676 m), weight 58.9 kg (129 lb 13.6 oz), SpO2 97.00%.    General: Elderly, kyphosis, in no distress Head: Eyes PERRLA, No xanthomas.   Normal cephalic and atramatic  Lungs:  Clear bilaterally to auscultation and percussion. Normal respiratory effort. No wheezes, no rales. Heart:   HRRR S1 S2 Pulses are 2+ & equal.   No carotid bruit. No JVD.  No abdominal bruits. Abdomen: Bowel sounds are positive, abdomen soft and non-tender without masses. No hepatosplenomegaly. Msk:  Back normal. Normal strength and tone for age. Extremities:   No clubbing, cyanosis or edema.  DP +1 Neuro: Alert and oriented X 3, non-focal, MAE x 4 GU: Deferred Rectal: Deferred Psych:  pleasant    Labs:   Lab Results  Component Value Date   WBC 16.8* 12/30/2011   HGB 11.2* 12/30/2011   HCT 31.1* 12/30/2011   MCV 88.6 12/30/2011   PLT 153 12/30/2011    Lab 12/30/11 0917  NA 130*  K 3.8  CL 99  CO2 23  BUN 19  CREATININE 0.48*  CALCIUM 8.0*  PROT 5.6*  BILITOT 1.7*  ALKPHOS 132*  ALT 30  AST 28  GLUCOSE 119*   No results found for this basename: PTT   Lab Results  Component Value Date   INR 3.64* 12/31/2011   INR 4.43* 12/30/2011   INR 2.97* 12/29/2011   Lab Results  Component Value Date   CKTOTAL 70 07/29/2011   CKMB 3.0 07/29/2011   TROPONINI <0.30 07/29/2011        Radiology:  Dg Chest Port 1 View  12/30/2011  *RADIOLOGY REPORT*  Clinical Data: Rapid breathing.  PORTABLE CHEST - 1 VIEW  Comparison: Chest 12/24/2011.  Findings: Pacing device is again identified.  There has been marked increase in bilateral airspace disease in the mid and lower lung zones, much worse on the right.  There is likely a right pleural effusion.  Cardiomegaly is noted.  IMPRESSION: Marked worsening of right worse than left airspace disease and right pleural effusion.  Finding could be due to aspiration, edema or pneumonia.   These results will  be called to the ordering clinician or representative by the Radiologist Assistant, and communication documented in the PACS Dashboard.  Original Report Authenticated By: Bernadene Bell. D'ALESSIO, M.D.     EKG:  V paced Personally viewed.   ECHO - Left ventricle: The cavity size was normal. Systolic function was severely reduced. The estimated ejection fraction was in the range of 20% to 25%. There is akinesis of the mid-distalanteroseptal, anterior, and apical myocardium; consistent with infarction. - Aortic valve: Moderately thickened, moderately calcified leaflets. There was moderate stenosis. Moderate regurgitation. Valve area: 0.56cm^2(VTI). Valve area: 0.56cm^2 (Vmax). Valve area: 0.55cm^2 (Vmean). - Mitral valve: Moderately to severely calcified annulus. Moderate to severe regurgitation. - Left atrium: The atrium was moderately to severely dilated. - Right atrium: The atrium was mildly dilated. - Tricuspid valve: Moderate regurgitation. - Pulmonary arteries: PA peak pressure: 40mm Hg (S).   ASSESSMENT/PLAN:   78 year old with pacemaker, dilated cardiomyopathy, severely reduced ejection fraction of 20%, both aortic valve and mitral valve disease with MRSA bacteremia.  I have reviewed Dr. Blair Dolphin prior note about the possibility of transesophageal echocardiogram and I do not believe that we should proceed with TEE given his increased risks. This would not change ultimate management. He would not be a candidate for any surgical repair. He would not be a candidate for pacemaker extraction. I discussed this with Dr. Katrinka Blazing. Continue with current medical management/antibiotics.  We are available if necessary.  Edward Schultz, MD  12/31/2011  10:53 AM

## 2011-12-31 NOTE — Progress Notes (Signed)
Patient ID: [name], male   DOB: 21-Sep-1922, 78 y.o.   MRN: [id number]    [facility] for Infectious Disease    Date of Admission:  12/24/2011   Total days of antibiotics 6          Principal Problem:  *Staphylococcus aureus bacteremia Active Problems:  UTI (lower urinary tract infection)  HYPOTHYROIDISM  DEMENTIA  CAD  Atrial fibrillation  Fever  Weakness generalized  Abdominal pain  Pacemaker  Pulmonary edema      . Chlorhexidine Gluconate Cloth  6 each Topical Q0600  . donepezil  5 mg Oral QHS  . feeding supplement  237 mL Oral BID BM  . furosemide  40 mg Intravenous Once  . furosemide  40 mg Oral Daily  . levothyroxine  88 mcg Oral QAC breakfast  . LORazepam  1 mg Oral QHS  . memantine  10 mg Oral BID  . mupirocin ointment  1 application Nasal BID  . omega-3 acid ethyl esters  1 g Oral BID  . phenazopyridine  100 mg Oral TID WC  . polyethylene glycol  17 g Oral Daily  . rifampin  600 mg Oral Daily  . silver sulfADIAZINE  1 application Topical Daily  . sodium chloride  3 mL Intravenous Q12H  . vancomycin  1,000 mg Intravenous Q12H  . vitamin C  500 mg Oral Daily  . Warfarin - Pharmacist Dosing Inpatient   Does not apply q1800  . DISCONTD: vancomycin  1,000 mg Intravenous Q24H    Subjective: He feels a little bit better but is having problems with his chronic back pain. His shortness of breath has improved and he is not coughing.  Objective: Temp:  [97.1 F (36.2 C)-98.6 F (37 C)] 98.5 F (36.9 C) (07/19 0823) Pulse Rate:  [71-82] 73  (07/19 0900) Resp:  [15-30] 25  (07/19 0900) BP: (87-119)/(47-77) 103/56 mmHg (07/19 0900) SpO2:  [92 %-100 %] 97 % (07/19 0900)  General: Alert, not confused. Skin: Scattered ecchymoses. Pacemaker site appears normal. Lungs: Clear anteriorly Cor: Distant heart sounds, no murmur heard Abdomen: Nontender.  Lab Results Lab Results  Component Value Date   WBC 16.8* 12/30/2011   HGB 11.2* 12/30/2011   HCT 31.1*  12/30/2011   MCV 88.6 12/30/2011   PLT 153 12/30/2011    Lab Results  Component Value Date   CREATININE 0.48* 12/30/2011   BUN 19 12/30/2011   NA 130* 12/30/2011   K 3.8 12/30/2011   CL 99 12/30/2011   CO2 23 12/30/2011    Lab Results  Component Value Date   ALT 30 12/30/2011   AST 28 12/30/2011   ALKPHOS 132* 12/30/2011   BILITOT 1.7* 12/30/2011      Microbiology: Recent Results (from the past 240 hour(s))  URINE CULTURE     Status: Normal   Collection Time   12/24/11  6:43 PM      Component Value Range Status Comment   Specimen Description URINE, CATHETERIZED   Final    Special Requests NONE   Final    Culture  Setup Time 12/25/2011 01:56   Final    Colony Count >=100,000 COLONIES/ML   Final    Culture     Final    Value: METHICILLIN RESISTANT STAPHYLOCOCCUS AUREUS     Note: RIFAMPIN AND GENTAMICIN SHOULD NOT BE USED AS SINGLE DRUGS FOR TREATMENT OF STAPH INFECTIONS.     Note: CRITICAL RESULT CALLED TO, READ BACK BY AND VERIFIED WITH: Kennyth Arnold @  11:26AM  12/28/11 BY DWEEKS   Report Status 12/28/2011 FINAL   Final    Organism ID, Bacteria METHICILLIN RESISTANT STAPHYLOCOCCUS AUREUS   Final   CULTURE, BLOOD (ROUTINE X 2)     Status: Normal   Collection Time   12/27/11  9:10 AM      Component Value Range Status Comment   Specimen Description BLOOD ARM RIGHT   Final    Special Requests BOTTLES DRAWN AEROBIC AND ANAEROBIC 10CC   Final    Culture  Setup Time 12/27/2011 15:49   Final    Culture     Final    Value: STAPHYLOCOCCUS AUREUS     Note: SUSCEPTIBILITIES PERFORMED ON PREVIOUS CULTURE WITHIN THE LAST 5 DAYS.     Note: Gram Stain Report Called to,Read Back By and Verified With: Kennyth Arnold @ 1253 12/28/11 BY KRAWS   Report Status 12/30/2011 FINAL   Final   CULTURE, BLOOD (ROUTINE X 2)     Status: Normal   Collection Time   12/27/11  9:30 AM      Component Value Range Status Comment   Specimen Description BLOOD HAND RIGHT   Final    Special Requests BOTTLES DRAWN AEROBIC  AND ANAEROBIC 10CC   Final    Culture  Setup Time 12/27/2011 15:48   Final    Culture     Final    Value: METHICILLIN RESISTANT STAPHYLOCOCCUS AUREUS     Note: RIFAMPIN AND GENTAMICIN SHOULD NOT BE USED AS SINGLE DRUGS FOR TREATMENT OF STAPH INFECTIONS. CRITICAL RESULT CALLED TO, READ BACK BY AND VERIFIED WITH: MICHELLE M. @1400  12/29/11 BY KRAWS This organism DOES NOT demonstrate inducible Clindamycin      resistance in vitro.     Note: Gram Stain Report Called to,Read Back By and Verified With: Kennyth Arnold @ 1253 12/28/11 BY KRAWS   Report Status 12/30/2011 FINAL   Final    Organism ID, Bacteria METHICILLIN RESISTANT STAPHYLOCOCCUS AUREUS   Final   MRSA PCR SCREENING     Status: Abnormal   Collection Time   12/27/11 10:17 AM      Component Value Range Status Comment   MRSA by PCR POSITIVE (*) NEGATIVE Final   CULTURE, BLOOD (ROUTINE X 2)     Status: Normal (Preliminary result)   Collection Time   12/29/11 11:26 PM      Component Value Range Status Comment   Specimen Description BLOOD RIGHT ARM   Final    Special Requests BOTTLES DRAWN AEROBIC AND ANAEROBIC 10CC   Final    Culture  Setup Time 12/30/2011 04:13   Final    Culture     Final    Value:        BLOOD CULTURE RECEIVED NO GROWTH TO DATE CULTURE WILL BE HELD FOR 5 DAYS BEFORE ISSUING A FINAL NEGATIVE REPORT   Report Status PENDING   Incomplete   CULTURE, BLOOD (ROUTINE X 2)     Status: Normal (Preliminary result)   Collection Time   12/29/11 11:27 PM      Component Value Range Status Comment   Specimen Description BLOOD RIGHT HAND   Final    Special Requests BOTTLES DRAWN AEROBIC AND ANAEROBIC 10CC   Final    Culture  Setup Time 12/30/2011 04:13   Final    Culture     Final    Value:        BLOOD CULTURE RECEIVED NO GROWTH TO DATE CULTURE WILL BE HELD FOR 5 DAYS  BEFORE ISSUING A FINAL NEGATIVE REPORT   Report Status PENDING   Incomplete     Studies/Results: Dg Chest Port 1 View  12/30/2011  *RADIOLOGY REPORT*  Clinical  Data: Rapid breathing.  PORTABLE CHEST - 1 VIEW  Comparison: Chest 12/24/2011.  Findings: Pacing device is again identified.  There has been marked increase in bilateral airspace disease in the mid and lower lung zones, much worse on the right.  There is likely a right pleural effusion.  Cardiomegaly is noted.  IMPRESSION: Marked worsening of right worse than left airspace disease and right pleural effusion.  Finding could be due to aspiration, edema or pneumonia.  These results will be called to the ordering clinician or representative by the Radiologist Assistant, and communication documented in the PACS Dashboard.  Original Report Authenticated By: Bernadene Bell. Maricela Curet, M.D.    Assessment: His latest blood culture is negative and it appears that he is beginning to respond to therapy for MRSA bacteremia. There is no evidence of definite endocarditis on his transthoracic echocardiogram. It is unlikely that a transesophageal echocardiogram will alter his antibiotic management but it may be useful to have Dr. Garnette Scheuermann, his cardiologist, see him to help [place] decide about the potential utility of a transesophageal echocardiogram in his overall management.  Plan: 1. Continue vancomycin and rifampin for 6-8 weeks 2. Okay to place a PICC 3. Please call Dr. Daiva Eves [phone]) for infectious disease questions this weekend  Cliffton Asters, MD [facility] for Infectious Disease [facility] [phone] pager   [phone] cell 12/31/2011, 9:30 AM

## 2012-01-01 LAB — COMPREHENSIVE METABOLIC PANEL
BUN: 16 mg/dL (ref 6–23)
CO2: 24 mEq/L (ref 19–32)
Calcium: 7.5 mg/dL — ABNORMAL LOW (ref 8.4–10.5)
Chloride: 96 mEq/L (ref 96–112)
Creatinine, Ser: 0.6 mg/dL (ref 0.50–1.35)
GFR calc Af Amer: >90 mL/min (ref >90)
GFR calc non Af Amer: 88 mL/min — ABNORMAL LOW (ref >90)
Total Bilirubin: 1.2 mg/dL (ref 0.3–1.2)

## 2012-01-01 LAB — CBC
HCT: 28.8 % — ABNORMAL LOW (ref 39.0–52.0)
MCH: 31.5 pg (ref 26.0–34.0)
MCV: 88.1 fL (ref 78.0–100.0)
Platelets: 248 10*3/uL (ref 150–400)
RBC: 3.27 MIL/uL — ABNORMAL LOW (ref 4.22–5.81)
WBC: 13.6 10*3/uL — ABNORMAL HIGH (ref 4.0–10.5)

## 2012-01-01 MED ORDER — WARFARIN SODIUM 2.5 MG PO TABS
2.5000 mg | ORAL_TABLET | Freq: Once | ORAL | Status: AC
  Administered 2012-01-01: 2.5 mg via ORAL
  Filled 2012-01-01: qty 1

## 2012-01-01 NOTE — Progress Notes (Signed)
Report called to jenae rn on 6700 pt belongings meds and chart taken to 6702, pt family here earlier and made aware of pt new room number. VSS not c/o voiced. [facility] [employer]

## 2012-01-01 NOTE — Progress Notes (Signed)
78 yo who was admitted for SAB. He has been on chronic coumadin for afib.   Anticoagulation: INR down to 2.63. No bleeding. Home dose: 6mg  qday, except 5mg  on MW.   Infectious Disease: D5 vanc for MRSA bacteremia. On rifampin also. ID is involved.  Trough subtherapeutic on 07/18 (6.3). Dose adjusted to 1g iv q12. WBC down a little to 13.6 7/15 vanc >>  7/15 blood x2 >> MRSA 7/12 Urine >> MRSA 7/17 blood x2 >> NGTD  Plan  1) Cont Vanc 1g IV q12. Repeat trough before tonight's dose 2) Coumadin 2.5mg  po x1 3) Daily PT/INR and monitor renal fxn closely

## 2012-01-01 NOTE — Progress Notes (Signed)
Patient ID: [name], male   DOB: [birth year], 78 y.o.   MRN: [id number]   Subjective: Patient is breathing much better after diuresis with iv lasix. More alert and communicating, 2 d echo shows no vegetations. He has EF of 25%. Palliative care meeting scheduled for tomorrow am.  Objective: Filed Vitals:   12/31/11 2036 12/31/11 2356 01/01/12 0430 01/01/12 0728  BP: 101/56 111/70 102/55 107/51  Pulse: 82 77 83 75  Temp: 98.3 F (36.8 C) 97.7 F (36.5 C) 97.4 F (36.3 C) 97.5 F (36.4 C)  TempSrc: Oral Oral Oral Oral  Resp: 23 22 25 20   Height:      Weight:      SpO2: 98% 100% 96% 100%   Weight change:    General: Appears in mild distress HEENT: No bruits, no goiter.  Heart: S1S2 RRR Lungs: Clear bilaterally Abdomen: Soft, minimal distention, positive bowel sounds, non tender to palpation Neuro: Alert oriented x 3 Extremities; no edema.   Lab Results:  Basename 01/01/12 0500 12/31/11 1034  NA 129* 129*  K 3.7 3.4*  CL 96 95*  CO2 24 24  GLUCOSE 90 122*  BUN 16 16  CREATININE 0.60 0.47*  CALCIUM 7.5* 7.7*  MG -- --  PHOS -- --    Basename 01/01/12 0500 12/31/11 1034  WBC 13.6* 15.6*  NEUTROABS -- --  HGB 10.3* 11.3*  HCT 28.8* 31.9*  MCV 88.1 89.9  PLT 248 204    Micro Results: Recent Results (from the past 240 hour(s))  URINE CULTURE     Status: Normal   Collection Time   12/24/11  6:43 PM      Component Value Range Status Comment   Specimen Description URINE, CATHETERIZED   Final    Special Requests NONE   Final    Culture  Setup Time 12/25/2011 01:56   Final    Colony Count >=100,000 COLONIES/ML   Final    Culture     Final    Value: METHICILLIN RESISTANT STAPHYLOCOCCUS AUREUS     Note: RIFAMPIN AND GENTAMICIN SHOULD NOT BE USED AS SINGLE DRUGS FOR TREATMENT OF STAPH INFECTIONS.     Note: CRITICAL RESULT CALLED TO, READ BACK BY AND VERIFIED WITH: SIERRA JOHNSON @ 11:26AM  12/28/11 BY DWEEKS   Report Status 12/28/2011 FINAL   Final    Organism  ID, Bacteria METHICILLIN RESISTANT STAPHYLOCOCCUS AUREUS   Final   CULTURE, BLOOD (ROUTINE X 2)     Status: Normal   Collection Time   12/27/11  9:10 AM      Component Value Range Status Comment   Specimen Description BLOOD ARM RIGHT   Final    Special Requests BOTTLES DRAWN AEROBIC AND ANAEROBIC 10CC   Final    Culture  Setup Time 12/27/2011 15:49   Final    Culture     Final    Value: STAPHYLOCOCCUS AUREUS     Note: SUSCEPTIBILITIES PERFORMED ON PREVIOUS CULTURE WITHIN THE LAST 5 DAYS.     Note: Gram Stain Report Called to,Read Back By and Verified With: Kennyth Arnold @ 1253 12/28/11 BY KRAWS   Report Status 12/30/2011 FINAL   Final   CULTURE, BLOOD (ROUTINE X 2)     Status: Normal   Collection Time   12/27/11  9:30 AM      Component Value Range Status Comment   Specimen Description BLOOD HAND RIGHT   Final    Special Requests BOTTLES DRAWN AEROBIC AND ANAEROBIC 10CC  Final    Culture  Setup Time 12/27/2011 15:48   Final    Culture     Final    Value: METHICILLIN RESISTANT STAPHYLOCOCCUS AUREUS     Note: RIFAMPIN AND GENTAMICIN SHOULD NOT BE USED AS SINGLE DRUGS FOR TREATMENT OF STAPH INFECTIONS. CRITICAL RESULT CALLED TO, READ BACK BY AND VERIFIED WITH: MICHELLE M. @1400  12/29/11 BY KRAWS This organism DOES NOT demonstrate inducible Clindamycin      resistance in vitro.     Note: Gram Stain Report Called to,Read Back By and Verified With: Kennyth Arnold @ 1253 12/28/11 BY KRAWS   Report Status 12/30/2011 FINAL   Final    Organism ID, Bacteria METHICILLIN RESISTANT STAPHYLOCOCCUS AUREUS   Final   MRSA PCR SCREENING     Status: Abnormal   Collection Time   12/27/11 10:17 AM      Component Value Range Status Comment   MRSA by PCR POSITIVE (*) NEGATIVE Final   CULTURE, BLOOD (ROUTINE X 2)     Status: Normal (Preliminary result)   Collection Time   12/29/11 11:26 PM      Component Value Range Status Comment   Specimen Description BLOOD RIGHT ARM   Final    Special Requests BOTTLES DRAWN  AEROBIC AND ANAEROBIC 10CC   Final    Culture  Setup Time 12/30/2011 04:13   Final    Culture     Final    Value:        BLOOD CULTURE RECEIVED NO GROWTH TO DATE CULTURE WILL BE HELD FOR 5 DAYS BEFORE ISSUING A FINAL NEGATIVE REPORT   Report Status PENDING   Incomplete   CULTURE, BLOOD (ROUTINE X 2)     Status: Normal (Preliminary result)   Collection Time   12/29/11 11:27 PM      Component Value Range Status Comment   Specimen Description BLOOD RIGHT HAND   Final    Special Requests BOTTLES DRAWN AEROBIC AND ANAEROBIC 10CC   Final    Culture  Setup Time 12/30/2011 04:13   Final    Culture     Final    Value:        BLOOD CULTURE RECEIVED NO GROWTH TO DATE CULTURE WILL BE HELD FOR 5 DAYS BEFORE ISSUING A FINAL NEGATIVE REPORT   Report Status PENDING   Incomplete     Studies/Results: Ct Abdomen Pelvis W Contrast  12/28/2011  *RADIOLOGY REPORT*  Clinical Data: Lower abdominal pain.  CT ABDOMEN AND PELVIS WITH CONTRAST   IMPRESSION:  1.  Periportal edema in the liver and possible early cirrhotic changes of the liver. 2.  Evidence of right heart failure with reflux of contrast into the IVC and hepatic veins. 3.  Scattered small amount of ascites throughout the peritoneal cavity without evidence of mass lesions or enlarged lymph nodes. 4.  Significant irregular wall thickening of the bladder.  This may relate to chronic outlet obstruction.  Other bladder pathology such as tumor or cystitis cannot be excluded.  Original Report Authenticated By: Reola Calkins, M.D.   Dg Chest Port 1 View  12/30/2011  *RADIOLOGY REPORT*  Clinical Data: Rapid breathing.  PORTABLE CHEST - 1 VIEW  Comparison: Chest 12/24/2011.   IMPRESSION: Marked worsening of right worse than left airspace disease and right pleural effusion.  Finding could be due to aspiration, edema or pneumonia.  These results will be called to the ordering clinician or representative by the Radiologist Assistant, and communication documented in the  PACS Dashboard.  Original Report Authenticated By: Bernadene Bell. Maricela Curet, M.D.    Medications: I have reviewed the patient's current medications.  Pulmonary edema Resolved Continue  on po lasix 40 mg daily  Weakness generalized, failure to thrive:  probably related to infection. Continue with treatment underline infection.   Blood and Urine Cultures positive for MRSA.  MRSA Bacteremia.   Infectious disease on board. Last blood cultures have been negative 2 D echo did not show vegetations. Cardiology recommends no TEE due to increased risks  picc line in for 6-8 weeks of antibiotics as per ID recommendations.  D/w Dr Orvan Falconer.  HYPOTHYROIDISM:  Continue with synthroid.   Orange colored urine He is on rifampin and pyridium , both of which change the urine color.  Atrial fibrillation:  Continue with coumadin per pharmacy.   Hyponatermia: Sodium is 129 Probably secondary to diuresis Will continue to monitor.  DEMENTIA :  continue with temazepam, aricept, namenda. ativan, per wife patient has been on both ativan and temazepam without problems with sedation.    Skin lower back buttock ulcer stage 2:  wound care consulted.  Following their recommendations.  Leukocytosis Improving  Suprapubic pain: Probably related to bladder spasm, UTI. bladder US with 300 cc, foley in place now. Will need to consider remove foley in 24-48 hours for voiding trial. KUB negative for obstruction. Pyridium prn for  Bladder spasm.  CT abdomen showed unusual bladder wall thickening.  Hopefully this is due to UTI.  Once UTI has cleared if bladder pain persists Urology evaluation would be appropriate.   Disposition:  Patient from home where he lives with his wife Palliative care consulted for goals of care  Will transfer him to floor with tele   LOS: 8 days   Meredeth Ide, PA-C [phone] [facility] 01/01/2012, 9:02 AM

## 2012-01-01 NOTE — Progress Notes (Signed)
Patient EX:BMWUXLK KAIEL WEIDE      DOB: 01-20-1923      GMW:[id number]  Attempted to complete goals of care at 430 PM.  Patients family were arriving out of town and so we were delayed in starting.  Patient had just finished getting his PICC and was complaining of pain. He received pain medication and was not up to participating in goals of care.  I spent 40 minutes updating his daughters on his fragile condition.  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  I attempted to reschedule there appointment for 2 pm on Saturday.  They declined stating that Mr. Ryant is sharper in the am.  They accepted a Sunday 930 AM appointment with Dr. Phillips Odor.  I have texted Dr. Sharl Ma the update.  Izel Hochberg L. Ladona Ridgel, MD MBA The Palliative Medicine Team at [facility] Phone: [phone] Pager: [phone]

## 2012-01-02 DIAGNOSIS — F068 Other specified mental disorders due to known physiological condition: Secondary | ICD-10-CM

## 2012-01-02 LAB — PROTIME-INR
INR: 1.67 — ABNORMAL HIGH (ref 0.00–1.49)
Prothrombin Time: 20 seconds — ABNORMAL HIGH (ref 11.6–15.2)

## 2012-01-02 LAB — BASIC METABOLIC PANEL
BUN: 16 mg/dL (ref 6–23)
Chloride: 91 mEq/L — ABNORMAL LOW (ref 96–112)
GFR calc Af Amer: >90 mL/min (ref >90)
GFR calc non Af Amer: 86 mL/min — ABNORMAL LOW (ref >90)
Potassium: 4 mEq/L (ref 3.5–5.1)
Sodium: 125 mEq/L — ABNORMAL LOW (ref 135–145)

## 2012-01-02 LAB — CBC
MCHC: 35.1 g/dL (ref 30.0–36.0)
Platelets: 312 10*3/uL (ref 150–400)
RDW: 14.7 % (ref 11.5–15.5)
WBC: 13 10*3/uL — ABNORMAL HIGH (ref 4.0–10.5)

## 2012-01-02 LAB — VANCOMYCIN, TROUGH: Vancomycin Tr: 28.2 ug/mL (ref 10.0–20.0)

## 2012-01-02 MED ORDER — FENTANYL 25 MCG/HR TD PT72
25.0000 ug | MEDICATED_PATCH | TRANSDERMAL | Status: DC
  Administered 2012-01-05: 25 ug via TRANSDERMAL
  Filled 2012-01-02: qty 1

## 2012-01-02 MED ORDER — MIRABEGRON ER 25 MG PO TB24
25.0000 mg | ORAL_TABLET | Freq: Every day | ORAL | Status: DC
  Administered 2012-01-05: 25 mg via ORAL
  Filled 2012-01-02 (×6): qty 1

## 2012-01-02 MED ORDER — FLEET ENEMA 7-19 GM/118ML RE ENEM
1.0000 | ENEMA | Freq: Every day | RECTAL | Status: DC | PRN
Start: 2012-01-02 — End: 2012-01-05
  Filled 2012-01-02: qty 1

## 2012-01-02 MED ORDER — WARFARIN SODIUM 5 MG PO TABS
5.0000 mg | ORAL_TABLET | Freq: Once | ORAL | Status: AC
Start: 2012-01-02 — End: 2012-01-03
  Filled 2012-01-02: qty 1

## 2012-01-02 MED ORDER — TEMAZEPAM 7.5 MG PO CAPS: 7.5000 mg | ORAL_CAPSULE | Freq: Once | ORAL | Status: DC

## 2012-01-02 MED ORDER — TAMSULOSIN HCL 0.4 MG PO CAPS
0.4000 mg | ORAL_CAPSULE | Freq: Every day | ORAL | Status: DC
  Administered 2012-01-03 – 2012-01-05 (×3): 0.4 mg via ORAL
  Filled 2012-01-02 (×4): qty 1

## 2012-01-02 MED ORDER — ZOLPIDEM TARTRATE 5 MG PO TABS: 5.0000 mg | ORAL_TABLET | Freq: Once | ORAL | Status: DC

## 2012-01-02 MED ORDER — TEMAZEPAM 7.5 MG PO CAPS: 7.5000 mg | ORAL_CAPSULE | Freq: Every evening | ORAL | Status: DC | PRN

## 2012-01-02 MED ORDER — TEMAZEPAM 15 MG PO CAPS
15.0000 mg | ORAL_CAPSULE | Freq: Every day | ORAL | Status: DC
  Administered 2012-01-02 – 2012-01-04 (×3): 15 mg via ORAL
  Filled 2012-01-02 (×3): qty 1

## 2012-01-02 MED ORDER — VANCOMYCIN HCL 500 MG IV SOLR
500.0000 mg | Freq: Two times a day (BID) | INTRAVENOUS | Status: DC
  Filled 2012-01-02 (×4): qty 500

## 2012-01-02 NOTE — Progress Notes (Signed)
MEDICATION RELATED CONSULT NOTE - FOLLOW UP   Pharmacy Consult for Coumadin Vancomycin Indication: A Fib MRSA bacteremia  Allergies  Allergen Reactions  . Penicillins Hives  . Uroxatral (Alfuzosin Hydrochloride) Other (See Comments)    hypotension    Patient Measurements: Height: 5\' 6"  (167.6 cm) Weight: 130 lb 1.1 oz (59 kg) IBW/kg (Calculated) : 63.8   Vital Signs: Temp: 98 F (36.7 C) (07/21 0904) Temp src: Oral (07/21 0904) BP: 94/59 mmHg (07/21 0904) Pulse Rate: 82  (07/21 0904) Intake/Output from previous day: 07/20 0701 - 07/21 0700 In: 1080 [P.O.:700; I.V.:180; IV Piggyback:200] Out: 2500 [Urine:2500] Intake/Output from this shift: Total I/O In: 240 [P.O.:240] Out: -   Labs:  Basename 01/02/12 0620 01/01/12 0500 12/31/11 1034  WBC 13.0* 13.6* 15.6*  HGB 11.0* 10.3* 11.3*  HCT 31.3* 28.8* 31.9*  PLT 312 248 204  APTT -- -- --  CREATININE 0.64 0.60 0.47*  LABCREA -- -- --  CREATININE 0.64 0.60 0.47*  CREAT24HRUR -- -- --  MG -- -- --  PHOS -- -- --  ALBUMIN -- 2.3* 2.4*  PROT -- 5.3* 5.5*  ALBUMIN -- 2.3* 2.4*  AST -- 23 25  ALT -- 19 23  ALKPHOS -- 125* 132*  BILITOT -- 1.2 1.2  BILIDIR -- -- --  IBILI -- -- --   Estimated Creatinine Clearance: 54.3 ml/min (by C-G formula based on Cr of 0.64).   Microbiology: Recent Results (from the past 720 hour(s))  URINE CULTURE     Status: Normal   Collection Time   12/24/11  6:43 PM      Component Value Range Status Comment   Specimen Description URINE, CATHETERIZED   Final    Special Requests NONE   Final    Culture  Setup Time 12/25/2011 01:56   Final    Colony Count >=100,000 COLONIES/ML   Final    Culture     Final    Value: METHICILLIN RESISTANT STAPHYLOCOCCUS AUREUS     Note: RIFAMPIN AND GENTAMICIN SHOULD NOT BE USED AS SINGLE DRUGS FOR TREATMENT OF STAPH INFECTIONS.     Note: CRITICAL RESULT CALLED TO, READ BACK BY AND VERIFIED WITH: SIERRA JOHNSON @ 11:26AM  12/28/11 BY DWEEKS   Report  Status 12/28/2011 FINAL   Final    Organism ID, Bacteria METHICILLIN RESISTANT STAPHYLOCOCCUS AUREUS   Final   CULTURE, BLOOD (ROUTINE X 2)     Status: Normal   Collection Time   12/27/11  9:10 AM      Component Value Range Status Comment   Specimen Description BLOOD ARM RIGHT   Final    Special Requests BOTTLES DRAWN AEROBIC AND ANAEROBIC 10CC   Final    Culture  Setup Time 12/27/2011 15:49   Final    Culture     Final    Value: STAPHYLOCOCCUS AUREUS     Note: SUSCEPTIBILITIES PERFORMED ON PREVIOUS CULTURE WITHIN THE LAST 5 DAYS.     Note: Gram Stain Report Called to,Read Back By and Verified With: Kennyth Arnold @ 1253 12/28/11 BY KRAWS   Report Status 12/30/2011 FINAL   Final   CULTURE, BLOOD (ROUTINE X 2)     Status: Normal   Collection Time   12/27/11  9:30 AM      Component Value Range Status Comment   Specimen Description BLOOD HAND RIGHT   Final    Special Requests BOTTLES DRAWN AEROBIC AND ANAEROBIC 10CC   Final    Culture  Setup Time 12/27/2011  15:48   Final    Culture     Final    Value: METHICILLIN RESISTANT STAPHYLOCOCCUS AUREUS     Note: RIFAMPIN AND GENTAMICIN SHOULD NOT BE USED AS SINGLE DRUGS FOR TREATMENT OF STAPH INFECTIONS. CRITICAL RESULT CALLED TO, READ BACK BY AND VERIFIED WITH: MICHELLE M. @1400  12/29/11 BY KRAWS This organism DOES NOT demonstrate inducible Clindamycin      resistance in vitro.     Note: Gram Stain Report Called to,Read Back By and Verified With: Kennyth Arnold @ 1253 12/28/11 BY KRAWS   Report Status 12/30/2011 FINAL   Final    Organism ID, Bacteria METHICILLIN RESISTANT STAPHYLOCOCCUS AUREUS   Final   MRSA PCR SCREENING     Status: Abnormal   Collection Time   12/27/11 10:17 AM      Component Value Range Status Comment   MRSA by PCR POSITIVE (*) NEGATIVE Final   CULTURE, BLOOD (ROUTINE X 2)     Status: Normal (Preliminary result)   Collection Time   12/29/11 11:26 PM      Component Value Range Status Comment   Specimen Description BLOOD RIGHT  ARM   Final    Special Requests BOTTLES DRAWN AEROBIC AND ANAEROBIC 10CC   Final    Culture  Setup Time 12/30/2011 04:13   Final    Culture     Final    Value:        BLOOD CULTURE RECEIVED NO GROWTH TO DATE CULTURE WILL BE HELD FOR 5 DAYS BEFORE ISSUING A FINAL NEGATIVE REPORT   Report Status PENDING   Incomplete   CULTURE, BLOOD (ROUTINE X 2)     Status: Normal (Preliminary result)   Collection Time   12/29/11 11:27 PM      Component Value Range Status Comment   Specimen Description BLOOD RIGHT HAND   Final    Special Requests BOTTLES DRAWN AEROBIC AND ANAEROBIC 10CC   Final    Culture  Setup Time 12/30/2011 04:13   Final    Culture     Final    Value:        BLOOD CULTURE RECEIVED NO GROWTH TO DATE CULTURE WILL BE HELD FOR 5 DAYS BEFORE ISSUING A FINAL NEGATIVE REPORT   Report Status PENDING   Incomplete      Assessment: 78 yo who was admitted for SAB. He has been on chronic coumadin for afib.   Anticoagulation: INR down to 1.67 after 2 mg coumadin 7/20. No bleeding. Home dose: 6mg  qday, except 5mg  on MW.   Infectious Disease: D5 vanc for MRSA bacteremia. On rifampin also. ID is involved. Trough supratherapeutic on 07/21 (28.2). Dose adjusted to 500 mg  iv q12. WBC down a little to 13.0 7/15 vanc >>     Goal of Therapy:  vanc trough 15-20 INR 2-3  Plan:  Decrease vancomycin to 500 mg iv q12h Coumadin 5 mg today Daily PT/INR  Vanc trough at [employer], Frann Rider 01/02/2012,10:47 AM

## 2012-01-02 NOTE — Consult Note (Signed)
Patient [name]      DOB: April 04, 1923      WUJ:[id number]     Consult Note from the Palliative Medicine Team at [facility]    Consult Requested by: Mauro Kaufmann, MD   PCP: Allean Found, MD Reason for Consultation:Goals of Care, Symptom Management, Care Coordination Phone Number:[phone]  Assessment of patients Current state: 78 yo gentleman with multiple medical problems including MRSA bacteremia, Congestive Heart Failure with an EF of 25%, chronic pain from spinal stenosis and Failure to Thrive with multiple illnesses/hospitalization in the within past year. He is currently severely deconditioned, bed bound and is potentially facing a difficult road ahead in terms of his disease process, treatment options and ability to recover a meaningful quality of life. I met with the patient (who is despite his vascular dementia, very competent) and his 3 daughters. I also reviewed his living will documents/previously completed advance directives.    Goals of Care: 1.  Code Status: DNR, discussed in detail with patient and family. Gold Form completed and copy given to family for records.   2. Scope of Treatment: 1. Vital Signs: Recommend once per shift only, minimize PM interruptions, can discontinue telemetry unless actively treating a reversible cardiac condition. 2. Respiratory/Oxygen:Currently does not require O2 support, may use as comfort measure for dyspnea if this occurs. 3. Nutritional Support/Tube Feeds: Does not desire any means of artifical nutrition. Comfort feeding and regular diet only. 4. Antibiotics: Would like to continue with aggressive management of his infections-will need 6-8 weeks of IV Vancomycin for MRSA Bacteremia, has PICC line in place. 5. Anti-cogulation: will continue anti-cogulation with Warfarin for A-Fib and routine INR monitoring. Will reassess his risk as his disease progresses. Has not has recent fall or history of significant bleed. 6. IVF: PRN  for reversible conditions but not to sustain life if he is in near terminal condition. 7. Reconciled and Adjusted Medications: Desires full scope medical interventions/medications- he also desires aggressive control of his pain and anxiety. See symptom management below.  8. Labs: PRN, no limits. 9. Telemetry: PRN, no limits. 10. Consults: PRN, no limits.  4. Disposition: Patient would like every opportunity to improve his current condition with the ultimate goal of returning home with 24/7 caregivers. He and his daughters would like to pursue SNF for rehabilitation and to complete his course of IV antibiotics. I also provided education on obtaining Palliative Care services in the facility. SW order placed.  3. Symptom Management:   1. Anxiety/Agitation: Patient was on a very high level of sedation at home prior to admission to the hospital which included Lorazepam, Restoril, Flexeril and Mirapex at bedtime for agitation, pain, severe restless leg syndrome. Without this level of symptom control he has a very difficult time sleeping and his legs move to the point where he becomes a fall risk and in the past has pulled his catheter off and has difficulty keeping a brief on at night for urinary incontinence. He has not gotten this level of sedation/symptom management in the hospital- while he complains of poor sleep I am not entirely sure he needs multiple medications. I will resume the Temazepam per patient request and resume the RLS medication-in addition to the Ativan which he is currently receiving. 2. Pain, Chronic-Spinal Stenosis: 1. He is currently not on any scheduled pain medications here in the hospital but is recieving regular prns- he was on a Fentanyl patch at home in addition to hydrocodone for breakthrough pain. He complains of pain and  shows non-verbal signs of pain during our discussion today. 2. Resume Fentanyl Patch and use Oxycodone for breakthrough pain. 3. Bowel Regimen: Need to make  sure he has had a BM, this can worsen urinary retention. Had 1 BM yesterday per records. Will make sure aggressive bowel regimen is in place.  4. Delirium: Minimize interuptions and interventions. Encourage normal sleep wake cycle.  Reorientation as needed. Aggressive management of sleep issues as above. Close attention to urinary retention. 5. Urinary Retention/UTI: issues with urinary retention/urinary incontinence. D/C leaking foley catheter, use condom cath, start low dose Flomax for known BPH and to help with retention issues, avoid oxybutynin or other anticholinergics. 6. Sacral decubitus, needs wound care assessment. On overlay. OOB as tolerated.  4. Psychosocial: Very supportive family, lots of good resources. Has elderly wife at home with her own health problems. They has 24/7 caregivers and in-home health providers. Family express high level on concern and stress as they care for two aging parents with multiple health problems. Patient maintain optimism and hope for the future.  5. Spiritual: Offered empathy and encouragement. No spiritual concerns or conflicts.   SUMMARY OF ORDERS PLACED: 1. DNR 2. D/C tele 3. PT/OT 4. Fentanyl Patch 5. SW for [facility] 6. D/c foley-condom cath 7. Flomax 8. Added scheduled Temazepam 9. Fleets enema prn/Miralax daily 10. OOB q shift 11. Added Mirapex for RLS  Patient Documents Completed or Given: Document Given Completed  Advanced Directives Pkt    MOST X   DNR X X  Gone from My Sight    Hard Choices X     ROS: Review of Systems  Constitutional: Positive for weight loss and malaise/fatigue. Negative for fever, chills and diaphoresis.  Respiratory: Positive for cough, sputum production and shortness of breath. Negative for wheezing.   Cardiovascular: Positive for orthopnea. Negative for chest pain.  Gastrointestinal: Positive for constipation. Negative for heartburn, nausea, vomiting, abdominal pain, diarrhea, blood in stool and  melena.  Genitourinary: Positive for urgency and frequency. Negative for dysuria.  Musculoskeletal: Positive for myalgias and joint pain.  Skin: Negative for itching and rash.  Neurological: Positive for tremors and weakness. Negative for dizziness and headaches.       Restless leg, severe in PM, insomnia  Psychiatric/Behavioral: Positive for memory loss. Negative for depression and suicidal ideas. The patient is nervous/anxious and has insomnia.       PMH:  Past Medical History  Diagnosis Date  . CHF (congestive heart failure)   . Arthritis   . Pacemaker   . Incontinence   . Pneumonia 2011; 07/29/11  . Neuropathy   . Heart murmur   . Dysrhythmia     paced  . Hypothyroidism   . Restless leg syndrome   . Blood transfusion   . Anemia   . Lower GI bleeding 2011    "from ATB I wasn't suppose to have had ordered"  . Dementia 07/29/11     not observed by this RN  . Atrial fibrillation      PSH: Past Surgical History  Procedure Date  . Insert / replace / remove pacemaker ~ 2006    initial placement  . Insert / replace / remove pacemaker 05/2009  . Knee arthroscopy ~ 2000's    right  . Back surgery     "as a kid"  . Inguinal hernia repair 1960's    left  . Inguinal hernia repair 1973    right   I have reviewed the FH and SH and  If appropriate update it with new information. Allergies  Allergen Reactions  . Penicillins Hives  . Uroxatral (Alfuzosin Hydrochloride) Other (See Comments)    hypotension   Scheduled Meds:   . donepezil  5 mg Oral QHS  . feeding supplement  237 mL Oral BID BM  . furosemide  40 mg Oral Daily  . levothyroxine  88 mcg Oral QAC breakfast  . LORazepam  1 mg Oral QHS  . memantine  10 mg Oral BID  . omega-3 acid ethyl esters  1 g Oral BID  . phenazopyridine  100 mg Oral TID WC  . polyethylene glycol  17 g Oral Daily  . potassium chloride  20 mEq Oral BID  . rifampin  600 mg Oral Daily  . silver sulfADIAZINE  1 application Topical Daily    . sodium chloride  10-40 mL Intracatheter Q12H  . sodium chloride  3 mL Intravenous Q12H  . vancomycin  1,000 mg Intravenous Q12H  . vitamin C  500 mg Oral Daily  . warfarin  2.5 mg Oral ONCE-1800  . Warfarin - Pharmacist Dosing Inpatient   Does not apply q1800  . zolpidem  5 mg Oral Once  . DISCONTD: temazepam  7.5 mg Oral Once   Continuous Infusions:   . sodium chloride 20 mL/hr (12/31/11 2332)   PRN Meds:.acetaminophen, acetaminophen, alum & mag hydroxide-simeth, HYDROmorphone (DILAUDID) injection, ondansetron (ZOFRAN) IV, ondansetron, oxyCODONE, sodium chloride, DISCONTD: temazepam    BP 94/59  Pulse 82  Temp 98 F (36.7 C) (Oral)  Resp 18  Ht 5\' 6"  (1.676 m)  Wt 59 kg (130 lb 1.1 oz)  BMI 20.99 kg/m2  SpO2 90%   PPS:50    Intake/Output Summary (Last 24 hours) at 01/02/12 1045 Last data filed at 01/02/12 0830  Gross per 24 hour  Intake    820 ml  Output   2350 ml  Net  -1530 ml   ZOX:[id number]                       Stool Softner:yes  Physical Exam:  General: Frail, deconditioned, NAD HEENT:  Normal, MMM Chest:   Normal respiratory pattern, no accessory muscle use Abdomen:Soft, NT Ext: generalized weakness Neuro: non-focal   Labs: CBC    Component Value Date/Time   WBC 13.0* 01/02/2012 0620   WBC 5.1 04/10/2008 1526   RBC 3.52* 01/02/2012 0620   RBC 4.00* 04/10/2008 1526   HGB 11.0* 01/02/2012 0620   HGB 13.6 04/10/2008 1526   HCT 31.3* 01/02/2012 0620   HCT 39.0 04/10/2008 1526   PLT 312 01/02/2012 0620   PLT 224 04/10/2008 1526   MCV 88.9 01/02/2012 0620   MCV 97.5 04/10/2008 1526   MCH 31.3 01/02/2012 0620   MCH 34.1* 04/10/2008 1526   MCHC 35.1 01/02/2012 0620   MCHC 34.9 04/10/2008 1526   RDW 14.7 01/02/2012 0620   RDW 13.7 04/10/2008 1526   LYMPHSABS 0.7 12/24/2011 1708   LYMPHSABS 1.2 04/10/2008 1526   MONOABS 0.9 12/24/2011 1708   MONOABS 0.7 04/10/2008 1526   EOSABS 0.0 12/24/2011 1708   EOSABS 0.1 04/10/2008 1526   BASOSABS 0.0 12/24/2011  1708   BASOSABS 0.0 04/10/2008 1526    BMET    Component Value Date/Time   NA 125* 01/02/2012 0620   K 4.0 01/02/2012 0620   CL 91* 01/02/2012 0620   CO2 22 01/02/2012 0620   GLUCOSE 83 01/02/2012 0620   BUN 16 01/02/2012 8119  CREATININE 0.64 01/02/2012 0620   CALCIUM 8.1* 01/02/2012 0620   GFRNONAA 86* 01/02/2012 0620   GFRAA >90 01/02/2012 0620    CMP     Component Value Date/Time   NA 125* 01/02/2012 0620   K 4.0 01/02/2012 0620   CL 91* 01/02/2012 0620   CO2 22 01/02/2012 0620   GLUCOSE 83 01/02/2012 0620   BUN 16 01/02/2012 0620   CREATININE 0.64 01/02/2012 0620   CALCIUM 8.1* 01/02/2012 0620   PROT 5.3* 01/01/2012 0500   ALBUMIN 2.3* 01/01/2012 0500   AST 23 01/01/2012 0500   ALT 19 01/01/2012 0500   ALKPHOS 125* 01/01/2012 0500   BILITOT 1.2 01/01/2012 0500   GFRNONAA 86* 01/02/2012 0620   GFRAA >90 01/02/2012 0620      Time In Time Out Total Time Spent with Patient Total Overall Time  930 1045 75 min    Greater than 50%  of this time was spent counseling and coordinating care related to the above assessment and plan.

## 2012-01-02 NOTE — Progress Notes (Signed)
Patient ID: [name], male   DOB: 04/30/1923, 78 y.o.   MRN: [id number]   Subjective: Patient is breathing much better after diuresis with iv lasix. More alert and communicating, 2 d echo shows no vegetations. He has EF of 25%. Palliative care has seen the patient and made changes in pain medications.Appreciate palliative care input.  Objective: Filed Vitals:   01/01/12 1643 01/01/12 2008 01/02/12 0528 01/02/12 0904  BP: 95/61 106/57 97/45 94/59   Pulse: 76 83 73 82  Temp: 98.2 F (36.8 C) 98.2 F (36.8 C) 98.3 F (36.8 C) 98 F (36.7 C)  TempSrc: Oral Oral Oral Oral  Resp: 17 18 18 18   Height:      Weight:  59 kg (130 lb 1.1 oz)    SpO2: 96% 97% 98% 90%   Weight change:    General: Appears in mild distress HEENT: No bruits, no goiter.  Heart: S1S2 RRR Lungs: Clear bilaterally Abdomen: Soft, minimal distention, positive bowel sounds, non tender to palpation Neuro: Alert oriented x 3 Extremities; no edema.   Lab Results:  Basename 01/02/12 0620 01/01/12 0500  NA 125* 129*  K 4.0 3.7  CL 91* 96  CO2 22 24  GLUCOSE 83 90  BUN 16 16  CREATININE 0.64 0.60  CALCIUM 8.1* 7.5*  MG -- --  PHOS -- --    Basename 01/02/12 0620 01/01/12 0500  WBC 13.0* 13.6*  NEUTROABS -- --  HGB 11.0* 10.3*  HCT 31.3* 28.8*  MCV 88.9 88.1  PLT 312 248    Micro Results: Recent Results (from the past 240 hour(s))  URINE CULTURE     Status: Normal   Collection Time   12/24/11  6:43 PM      Component Value Range Status Comment   Specimen Description URINE, CATHETERIZED   Final    Special Requests NONE   Final    Culture  Setup Time 12/25/2011 01:56   Final    Colony Count >=100,000 COLONIES/ML   Final    Culture     Final    Value: METHICILLIN RESISTANT STAPHYLOCOCCUS AUREUS     Note: RIFAMPIN AND GENTAMICIN SHOULD NOT BE USED AS SINGLE DRUGS FOR TREATMENT OF STAPH INFECTIONS.     Note: CRITICAL RESULT CALLED TO, READ BACK BY AND VERIFIED WITH: SIERRA JOHNSON @ 11:26AM   12/28/11 BY DWEEKS   Report Status 12/28/2011 FINAL   Final    Organism ID, Bacteria METHICILLIN RESISTANT STAPHYLOCOCCUS AUREUS   Final   CULTURE, BLOOD (ROUTINE X 2)     Status: Normal   Collection Time   12/27/11  9:10 AM      Component Value Range Status Comment   Specimen Description BLOOD ARM RIGHT   Final    Special Requests BOTTLES DRAWN AEROBIC AND ANAEROBIC 10CC   Final    Culture  Setup Time 12/27/2011 15:49   Final    Culture     Final    Value: STAPHYLOCOCCUS AUREUS     Note: SUSCEPTIBILITIES PERFORMED ON PREVIOUS CULTURE WITHIN THE LAST 5 DAYS.     Note: Gram Stain Report Called to,Read Back By and Verified With: Kennyth Arnold @ 1253 12/28/11 BY KRAWS   Report Status 12/30/2011 FINAL   Final   CULTURE, BLOOD (ROUTINE X 2)     Status: Normal   Collection Time   12/27/11  9:30 AM      Component Value Range Status Comment   Specimen Description BLOOD HAND RIGHT   Final  Special Requests BOTTLES DRAWN AEROBIC AND ANAEROBIC 10CC   Final    Culture  Setup Time 12/27/2011 15:48   Final    Culture     Final    Value: METHICILLIN RESISTANT STAPHYLOCOCCUS AUREUS     Note: RIFAMPIN AND GENTAMICIN SHOULD NOT BE USED AS SINGLE DRUGS FOR TREATMENT OF STAPH INFECTIONS. CRITICAL RESULT CALLED TO, READ BACK BY AND VERIFIED WITH: MICHELLE M. @1400  12/29/11 BY KRAWS This organism DOES NOT demonstrate inducible Clindamycin      resistance in vitro.     Note: Gram Stain Report Called to,Read Back By and Verified With: Kennyth Arnold @ 1253 12/28/11 BY KRAWS   Report Status 12/30/2011 FINAL   Final    Organism ID, Bacteria METHICILLIN RESISTANT STAPHYLOCOCCUS AUREUS   Final   MRSA PCR SCREENING     Status: Abnormal   Collection Time   12/27/11 10:17 AM      Component Value Range Status Comment   MRSA by PCR POSITIVE (*) NEGATIVE Final   CULTURE, BLOOD (ROUTINE X 2)     Status: Normal (Preliminary result)   Collection Time   12/29/11 11:26 PM      Component Value Range Status Comment    Specimen Description BLOOD RIGHT ARM   Final    Special Requests BOTTLES DRAWN AEROBIC AND ANAEROBIC 10CC   Final    Culture  Setup Time 12/30/2011 04:13   Final    Culture     Final    Value:        BLOOD CULTURE RECEIVED NO GROWTH TO DATE CULTURE WILL BE HELD FOR 5 DAYS BEFORE ISSUING A FINAL NEGATIVE REPORT   Report Status PENDING   Incomplete   CULTURE, BLOOD (ROUTINE X 2)     Status: Normal (Preliminary result)   Collection Time   12/29/11 11:27 PM      Component Value Range Status Comment   Specimen Description BLOOD RIGHT HAND   Final    Special Requests BOTTLES DRAWN AEROBIC AND ANAEROBIC 10CC   Final    Culture  Setup Time 12/30/2011 04:13   Final    Culture     Final    Value:        BLOOD CULTURE RECEIVED NO GROWTH TO DATE CULTURE WILL BE HELD FOR 5 DAYS BEFORE ISSUING A FINAL NEGATIVE REPORT   Report Status PENDING   Incomplete     Studies/Results: Ct Abdomen Pelvis W Contrast  12/28/2011  *RADIOLOGY REPORT*  Clinical Data: Lower abdominal pain.  CT ABDOMEN AND PELVIS WITH CONTRAST   IMPRESSION:  1.  Periportal edema in the liver and possible early cirrhotic changes of the liver. 2.  Evidence of right heart failure with reflux of contrast into the IVC and hepatic veins. 3.  Scattered small amount of ascites throughout the peritoneal cavity without evidence of mass lesions or enlarged lymph nodes. 4.  Significant irregular wall thickening of the bladder.  This may relate to chronic outlet obstruction.  Other bladder pathology such as tumor or cystitis cannot be excluded.  Original Report Authenticated By: Reola Calkins, M.D.   Dg Chest Port 1 View  12/30/2011  *RADIOLOGY REPORT*  Clinical Data: Rapid breathing.  PORTABLE CHEST - 1 VIEW  Comparison: Chest 12/24/2011.   IMPRESSION: Marked worsening of right worse than left airspace disease and right pleural effusion.  Finding could be due to aspiration, edema or pneumonia.  These results will be called to the ordering clinician or  representative by  the Radiologist Assistant, and communication documented in the PACS Dashboard.  Original Report Authenticated By: Bernadene Bell. Maricela Curet, M.D.    Medications: I have reviewed the patient's current medications.  Assesment/Plan:  Pulmonary edema Resolved Continue  on po lasix 40 mg daily  Weakness generalized, failure to thrive:  probably related to infection. Continue with treatment underline infection.   Blood and Urine Cultures positive for MRSA.  MRSA Bacteremia.   Infectious disease on board. Last blood cultures have been negative 2 D echo did not show vegetations. Cardiology recommends no TEE due to increased risks  picc line in for 6-8 weeks of antibiotics as per ID recommendations.  D/w Dr Orvan Falconer.  HYPOTHYROIDISM:  Continue with synthroid.   Orange colored urine He is on rifampin and pyridium , both of which change the urine color.  Atrial fibrillation:  Continue with coumadin per pharmacy.   Hyponatermia: Sodium is 125 Probably secondary to diuresis Will continue to monitor.  DEMENTIA :  continue with temazepam, aricept, namenda. ativan, per wife patient has been on both ativan and temazepam without problems with sedation.    Skin lower back buttock ulcer stage 2:  wound care consulted.  Following their recommendations.  Leukocytosis Improving  Suprapubic pain: Probably related to bladder spasm, UTI. bladder US with 300 cc, foley in place now. Will need to consider remove foley in 24-48 hours for voiding trial. KUB negative for obstruction. Pyridium prn for  Bladder spasm.   Foley catheter is now out due to leaking, inserted condom catheter.  Disposition:  Patient from home where he lives with his wife Palliative care meeting done, patient is DNR. Plan to go to SNF with rehab     LOS: 9 days   Jerilynn Birkenhead [phone] [facility] 01/02/2012, 1:54 PM

## 2012-01-03 LAB — PROTIME-INR
INR: 1.5 — ABNORMAL HIGH (ref 0.00–1.49)
Prothrombin Time: 18.4 seconds — ABNORMAL HIGH (ref 11.6–15.2)

## 2012-01-03 MED ORDER — VANCOMYCIN HCL IN DEXTROSE 1-5 GM/200ML-% IV SOLN
1000.0000 mg | INTRAVENOUS | Status: DC
  Administered 2012-01-03 – 2012-01-05 (×3): 1000 mg via INTRAVENOUS
  Filled 2012-01-03 (×3): qty 200

## 2012-01-03 MED ORDER — WARFARIN SODIUM 7.5 MG PO TABS
7.5000 mg | ORAL_TABLET | Freq: Once | ORAL | Status: AC
  Administered 2012-01-03: 7.5 mg via ORAL
  Filled 2012-01-03: qty 1

## 2012-01-03 NOTE — Progress Notes (Signed)
Speech Language Pathology Dysphagia Treatment Patient Details Name: [name] MRN: [id number] DOB: 1922/08/30 Today's Date: 01/03/2012 Time: 6213-0865 SLP Time Calculation (min): 15 min  Assessment / Plan / Recommendation Clinical Impression  Pt with improved mentation.  Caregiver, Jasmine, present, stating pt ate most of breakfast without difficulty.  Per observation, with large successive liquid boluses pt demonstrates s/s aspiration.  Reviewed again the necessity of limiting bolus size/rate of consumption, sitting upright - implementing these simple measures reduced dysphagia symptoms.  Discussed with caregiver. Pt/family have met with palliative care and set GOC.  Recommend continuing regular diet with thin liquids; implementing strategies to maximize safety with pos.  No further SLP warranted at this time.      Diet Recommendation  Continue with Current Diet: Regular;Thin liquid    SLP Plan All goals met      Swallowing Goals  SLP Swallowing Goals Patient will consume recommended diet without observed clinical signs of aspiration with: Minimal assistance Swallow Study Goal #1 - Progress: Met Patient will utilize recommended strategies during swallow to increase swallowing safety with: Minimal cueing Swallow Study Goal #2 - Progress: Met  General Temperature Spikes Noted: No Respiratory Status: Supplemental O2 delivered via (comment) Behavior/Cognition: Alert;Cooperative;Pleasant mood Oral Cavity - Dentition: Adequate natural dentition Patient Positioning: Upright in bed  Oral Cavity - Oral Hygiene Does patient have any of the following "at risk" factors?: Oxygen therapy - cannula, mask, simple oxygen devices Brush patient's teeth BID with toothbrush (using toothpaste with fluoride): Yes Patient is AT RISK - Oral Care Protocol followed (see row info): Yes   Dysphagia Treatment Treatment focused on: Skilled observation of diet tolerance Treatment Methods/Modalities:  Skilled observation Patient observed directly with PO's: Yes Type of PO's observed: Thin liquids Feeding: Able to feed self Liquids provided via: Cup;No straw Pharyngeal Phase Signs & Symptoms: Multiple swallows;Immediate cough Type of cueing: Verbal Amount of cueing: Minimal   Hermon Zea L. Samson Frederic, [place] CCC/SLP Pager [phone]      Blenda Mounts Laurice 01/03/2012, 9:25 AM

## 2012-01-03 NOTE — Progress Notes (Signed)
Patient ID: [name], male   DOB: 1923/05/23, 78 y.o.   MRN: [id number]    [facility]    Date of Admission:  12/24/2011   Total days of antibiotics 9         Principal Problem:  *Staphylococcus aureus bacteremia Active Problems:  UTI (lower urinary tract infection)  HYPOTHYROIDISM  DEMENTIA  CAD  Atrial fibrillation  Spinal stenosis  Chronic pain  Fever  Weakness generalized  Abdominal pain  Pacemaker  Pulmonary edema      . donepezil  5 mg Oral QHS  . feeding supplement  237 mL Oral BID BM  . fentaNYL  25 mcg Transdermal Q72H  . furosemide  40 mg Oral Daily  . levothyroxine  88 mcg Oral QAC breakfast  . LORazepam  1 mg Oral QHS  . memantine  10 mg Oral BID  . mirabegron ER  25 mg Oral Daily  . omega-3 acid ethyl esters  1 g Oral BID  . polyethylene glycol  17 g Oral Daily  . rifampin  600 mg Oral Daily  . silver sulfADIAZINE  1 application Topical Daily  . sodium chloride  10-40 mL Intracatheter Q12H  . sodium chloride  3 mL Intravenous Q12H  . Tamsulosin HCl  0.4 mg Oral QPC supper  . temazepam  15 mg Oral QHS  . vancomycin  1,000 mg Intravenous Q24H  . vitamin C  500 mg Oral Daily  . warfarin  5 mg Oral ONCE-1800  . warfarin  7.5 mg Oral ONCE-1800  . Warfarin - Pharmacist Dosing Inpatient   Does not apply q1800  . DISCONTD: vancomycin  500 mg Intravenous Q12H    Subjective: He is feeling better  Objective: Temp:  [98.2 F (36.8 C)-98.7 F (37.1 C)] 98.2 F (36.8 C) (07/22 0609) Pulse Rate:  [78-81] 78  (07/22 0609) Resp:  [18-20] 18  (07/22 0609) BP: (98-107)/(43-61) 98/49 mmHg (07/22 0609) SpO2:  [93 %-96 %] 95 % (07/22 0609) Weight:  [59 kg (130 lb 1.1 oz)] 59 kg (130 lb 1.1 oz) (07/21 2200)  General: He is alert and comfortable and sitting up in the chair Skin: Scattered ecchymoses Lungs: Clear Cor: Regular S1 and S2 with no murmur heard Left upper chest pacemaker site appears normal  Lab Results Lab Results    Component Value Date   WBC 13.0* 01/02/2012   HGB 11.0* 01/02/2012   HCT 31.3* 01/02/2012   MCV 88.9 01/02/2012   PLT 312 01/02/2012    Lab Results  Component Value Date   CREATININE 0.64 01/02/2012   BUN 16 01/02/2012   NA 125* 01/02/2012   K 4.0 01/02/2012   CL 91* 01/02/2012   CO2 22 01/02/2012    Lab Results  Component Value Date   ALT 19 01/01/2012   AST 23 01/01/2012   ALKPHOS 125* 01/01/2012   BILITOT 1.2 01/01/2012      Microbiology: Recent Results (from the past 240 hour(s))  URINE CULTURE     Status: Normal   Collection Time   12/24/11  6:43 PM      Component Value Range Status Comment   Specimen Description URINE, CATHETERIZED   Final    Special Requests NONE   Final    Culture  Setup Time 12/25/2011 01:56   Final    Colony Count >=100,000 COLONIES/ML   Final    Culture     Final    Value: METHICILLIN RESISTANT STAPHYLOCOCCUS AUREUS  Note: RIFAMPIN AND GENTAMICIN SHOULD NOT BE USED AS SINGLE DRUGS FOR TREATMENT OF STAPH INFECTIONS.     Note: CRITICAL RESULT CALLED TO, READ BACK BY AND VERIFIED WITH: SIERRA JOHNSON @ 11:26AM  12/28/11 BY DWEEKS   Report Status 12/28/2011 FINAL   Final    Organism ID, Bacteria METHICILLIN RESISTANT STAPHYLOCOCCUS AUREUS   Final   CULTURE, BLOOD (ROUTINE X 2)     Status: Normal   Collection Time   12/27/11  9:10 AM      Component Value Range Status Comment   Specimen Description BLOOD ARM RIGHT   Final    Special Requests BOTTLES DRAWN AEROBIC AND ANAEROBIC 10CC   Final    Culture  Setup Time 12/27/2011 15:49   Final    Culture     Final    Value: STAPHYLOCOCCUS AUREUS     Note: SUSCEPTIBILITIES PERFORMED ON PREVIOUS CULTURE WITHIN THE LAST 5 DAYS.     Note: Gram Stain Report Called to,Read Back By and Verified With: Kennyth Arnold @ 1253 12/28/11 BY KRAWS   Report Status 12/30/2011 FINAL   Final   CULTURE, BLOOD (ROUTINE X 2)     Status: Normal   Collection Time   12/27/11  9:30 AM      Component Value Range Status Comment   Specimen  Description BLOOD HAND RIGHT   Final    Special Requests BOTTLES DRAWN AEROBIC AND ANAEROBIC 10CC   Final    Culture  Setup Time 12/27/2011 15:48   Final    Culture     Final    Value: METHICILLIN RESISTANT STAPHYLOCOCCUS AUREUS     Note: RIFAMPIN AND GENTAMICIN SHOULD NOT BE USED AS SINGLE DRUGS FOR TREATMENT OF STAPH INFECTIONS. CRITICAL RESULT CALLED TO, READ BACK BY AND VERIFIED WITH: MICHELLE M. @1400  12/29/11 BY KRAWS This organism DOES NOT demonstrate inducible Clindamycin      resistance in vitro.     Note: Gram Stain Report Called to,Read Back By and Verified With: Kennyth Arnold @ 1253 12/28/11 BY KRAWS   Report Status 12/30/2011 FINAL   Final    Organism ID, Bacteria METHICILLIN RESISTANT STAPHYLOCOCCUS AUREUS   Final   MRSA PCR SCREENING     Status: Abnormal   Collection Time   12/27/11 10:17 AM      Component Value Range Status Comment   MRSA by PCR POSITIVE (*) NEGATIVE Final   CULTURE, BLOOD (ROUTINE X 2)     Status: Normal (Preliminary result)   Collection Time   12/29/11 11:26 PM      Component Value Range Status Comment   Specimen Description BLOOD RIGHT ARM   Final    Special Requests BOTTLES DRAWN AEROBIC AND ANAEROBIC 10CC   Final    Culture  Setup Time 12/30/2011 04:13   Final    Culture     Final    Value:        BLOOD CULTURE RECEIVED NO GROWTH TO DATE CULTURE WILL BE HELD FOR 5 DAYS BEFORE ISSUING A FINAL NEGATIVE REPORT   Report Status PENDING   Incomplete   CULTURE, BLOOD (ROUTINE X 2)     Status: Normal (Preliminary result)   Collection Time   12/29/11 11:27 PM      Component Value Range Status Comment   Specimen Description BLOOD RIGHT HAND   Final    Special Requests BOTTLES DRAWN AEROBIC AND ANAEROBIC 10CC   Final    Culture  Setup Time 12/30/2011 04:13   Final  Culture     Final    Value:        BLOOD CULTURE RECEIVED NO GROWTH TO DATE CULTURE WILL BE HELD FOR 5 DAYS BEFORE ISSUING A FINAL NEGATIVE REPORT   Report Status PENDING   Incomplete      Studies/Results: No results found.  Assessment: He does not have any evidence of endocarditis by his recent transthoracic echocardiogram and I agree with not pursuing transesophageal echocardiogram now. I will continue vancomycin and rifampin for a minimum of 6 weeks with an attempt to cure her his MRSA bacteremia and salvage his pacemaker.  Plan: 1. Continue vancomycin and rifampin 2. I will followup on Wednesday, July 24  Cliffton Asters, MD [facility] [phone] pager   [phone] cell 01/03/2012, 12:36 PM

## 2012-01-03 NOTE — Progress Notes (Signed)
Occupational Therapy Treatment Patient Details Name: [name] MRN: [id number] DOB: Dec 27, 1922 Today's Date: 01/03/2012 Time: 4098-1191 OT Time Calculation (min): 28 min  OT Assessment / Plan / Recommendation Comments on Treatment Session Pt is making progress in cognition and overall mobility.  Session limited by ongoing incontinence and need for pericare.  SNF is an appropriate d/c environment as requested by family per chart review.    Follow Up Recommendations  Skilled nursing facility    Barriers to Discharge       Equipment Recommendations  Defer to next venue    Recommendations for Other Services    Frequency Min 2X/week   Plan Discharge plan needs to be updated    Precautions / Restrictions Precautions Precautions: Fall Precaution Comments: incontinent Restrictions Weight Bearing Restrictions: No   Pertinent Vitals/Pain No pain    ADL  Lower Body Dressing: Performed;+1 Total assistance Toileting - Clothing Manipulation and Hygiene: Performed;+1 Total assistance Toileting - Clothing Manipulation and Hygiene: Patient Percentage: 0% Where Assessed - Toileting Clothing Manipulation and Hygiene: Standing Equipment Used: Rolling walker;Gait belt Transfers/Ambulation Related to ADLs: Pt stood with mod assist from bed and chair, repeated incontinence of bowel and bladder .  Pt fatigued with standing required for clean up, limiting areas addressed in session.    OT Diagnosis:    OT Problem List:   OT Treatment Interventions:     OT Goals ADL Goals Pt Will Transfer to Toilet: with mod assist;Stand pivot transfer;with DME;3-in-1 ADL Goal: Toilet Transfer - Progress: Progressing toward goals Miscellaneous OT Goals Miscellaneous OT Goal #1: Pt will roll right and left with supervison with HOB flat and use of rail to A with BADLs OT Goal: Miscellaneous Goal #1 - Progress: Progressing toward goals Miscellaneous OT Goal #2: Pt will come up to sit with min A in prep for  transfers OT Goal: Miscellaneous Goal #2 - Progress: Progressing toward goals Miscellaneous OT Goal #3: Pt will be Mod for sit to stand from all surface. OT Goal: Miscellaneous Goal #3 - Progress: Progressing toward goals (continue for consistency)  Visit Information  Last OT Received On: 01/03/12 Assistance Needed: +2 PT/OT Co-Evaluation/Treatment: Yes    Subjective Data      Prior Functioning       Cognition  Overall Cognitive Status: History of cognitive impairments - at baseline Area of Impairment: Memory Arousal/Alertness: Awake/alert Orientation Level: Oriented X4 / Intact Behavior During Session: [facility] for tasks performed    Mobility Bed Mobility Bed Mobility: Supine to Sit;Sitting - Scoot to Edge of Bed Supine to Sit: 3: Mod assist;HOB flat;With rails Sitting - Scoot to Edge of Bed: 3: Mod assist Details for Bed Mobility Assistance: cueing for sequence and assist to pivot bil LE to EOB and elevate trunk with use of rail Transfers Transfers: Sit to Stand;Stand to Sit Sit to Stand: 3: Mod assist;From bed;From chair/3-in-1 Stand to Sit: 3: Mod assist;To chair/3-in-1 Details for Transfer Assistance: 3 trials of sit to stand with second person for safety. cueing for hand placement, anterior translation, and safety. Pt stood from bed ambulated 3 ft then had to sit in chair due to condom cath coming off and pt with BM. Pt stood 2 more times with mod assist and fatigue with increased standing time and increased posterior lean with fatigue grossly 3 min each standing trial with assist for pericare due to bowel incontinence.    Exercises   Balance Static Standing Balance Static Standing - Balance Support: Bilateral upper extremity supported Static Standing -  Level of Assistance: 3: Mod assist Static Standing - Comment/# of Minutes: posterior left lean in standing with assist to maintain balance  End of Session OT - End of Session Activity Tolerance: Patient limited by  fatigue Patient left: in chair;with call bell/phone within reach;Other (comment) (caregiver- Jasmine in room)  GO     Evern Bio 01/03/2012, 12:59 PM [phone]

## 2012-01-03 NOTE — Progress Notes (Signed)
Physical Therapy Treatment Patient Details Name: [name] MRN: [id number] DOB: [birth year] Today's Date: 01/03/2012 Time: 5784-6962 PT Time Calculation (min): 30 min  PT Assessment / Plan / Recommendation Comments on Treatment Session  Pt admitted with fever who continues to progress slowly with therapy limited by bowel incontinence with movement and standing. Pt fatigues with repeated standing trials for self care and unable to attempt further ambulation. Per pt aide family now agreeable to SNF due to increased burden of care and agree that this is a beneficial plan to progress mobility.     Follow Up Recommendations  Skilled nursing facility    Barriers to Discharge        Equipment Recommendations       Recommendations for Other Services    Frequency     Plan Discharge plan needs to be updated;Frequency remains appropriate    Precautions / Restrictions Precautions Precautions: Fall Precaution Comments: incontinent   Pertinent Vitals/Pain 2/10 Rknee after repeated standing trials    Mobility  Bed Mobility Bed Mobility: Supine to Sit;Sitting - Scoot to Edge of Bed Supine to Sit: 3: Mod assist;HOB flat;With rails Sitting - Scoot to Edge of Bed: 3: Mod assist Details for Bed Mobility Assistance: cueing for sequence and assist to pivot bil LE to EOB and elevate trunk with use of rail Transfers Transfers: Sit to Stand;Stand to Sit Sit to Stand: 3: Mod assist;From bed;From chair/3-in-1 Stand to Sit: 3: Mod assist;To chair/3-in-1 Details for Transfer Assistance: 3 trials of sit to stand with second person for safety. cueing for hand placement, anterior translation, and safety. Pt stood from bed ambulated 3 ft then had to sit in chair due to condom cath coming off and pt with BM. Pt stood 2 more times with mod assist and fatigue with increased standing time and increased posterior lean with fatigue grossly 3 min each standing trial with assist for pericare due to bowel  incontinence.  Ambulation/Gait Ambulation/Gait Assistance: 3: Mod assist Ambulation Distance (Feet): 3 Feet Assistive device: Rolling walker Ambulation/Gait Assistance Details: cueing for posture and balance due to posterior lean.  Gait Pattern: Trunk flexed;Shuffle    Exercises General Exercises - Lower Extremity Long Arc Quad: AROM;5 reps;10 reps;Right;Left;Seated (5 reps on RLE due to knee pain) Hip Flexion/Marching: AROM;Both;10 reps;Seated   PT Diagnosis:    PT Problem List:   PT Treatment Interventions:     PT Goals Acute Rehab PT Goals PT Goal: Supine/Side to Sit - Progress: Progressing toward goal Pt will go Sit to Stand: with min assist PT Goal: Sit to Stand - Progress: Updated due to goal met Pt will go Stand to Sit: with min assist PT Goal: Stand to Sit - Progress: Updated due to goals met PT Transfer Goal: Bed to Chair/Chair to Bed - Progress: Progressing toward goal PT Goal: Ambulate - Progress: Progressing toward goal  Visit Information  Last PT Received On: 01/03/12 Assistance Needed: +2    Subjective Data  Subjective: im tired of this bed   Cognition  Overall Cognitive Status: History of cognitive impairments - at baseline Arousal/Alertness: Awake/alert Behavior During Session: [facility] for tasks performed    Balance  Static Standing Balance Static Standing - Balance Support: Bilateral upper extremity supported Static Standing - Level of Assistance: 3: Mod assist Static Standing - Comment/# of Minutes: posterior left lean in standing with assist to maintain balance  End of Session PT - End of Session Equipment Utilized During Treatment: Gait belt Activity Tolerance: Patient tolerated treatment well  Patient left: in chair;with call bell/phone within reach;with family/visitor present   GP     Toney Sang [facility] 01/03/2012, 12:50 PM Delaney Meigs, PT [phone]

## 2012-01-03 NOTE — Progress Notes (Signed)
Patient ID: [name], male   DOB: [birth year], 78 y.o.   MRN: [id number]   Subjective:  Patient is breathing much better after diuresis with iv lasix. More alert and communicating, 2 d echo shows no vegetations. He has EF of 25%. Palliative care has seen the patient and made changes in pain medications.Appreciate palliative care input. Patient is now eating better. Awaiting to work with PT/OT  Objective: Filed Vitals:   01/02/12 0904 01/02/12 1523 01/02/12 2200 01/03/12 0609  BP: 94/59 101/61 107/43 98/49  Pulse: 82 80 81 78  Temp: 98 F (36.7 C) 98.7 F (37.1 C) 98.3 F (36.8 C) 98.2 F (36.8 C)  TempSrc: Oral Oral Oral Oral  Resp: 18 18 20 18   Height:      Weight:   59 kg (130 lb 1.1 oz)   SpO2: 90% 93% 96% 95%   Weight change: 0 kg (0 lb)   General: Appears in mild distress HEENT: No bruits, no goiter.  Heart: S1S2 RRR Lungs: Clear bilaterally Abdomen: Soft, minimal distention, positive bowel sounds, non tender to palpation Neuro: Alert oriented x 3 Extremities; no edema.   Lab Results:  Basename 01/02/12 0620 01/01/12 0500  NA 125* 129*  K 4.0 3.7  CL 91* 96  CO2 22 24  GLUCOSE 83 90  BUN 16 16  CREATININE 0.64 0.60  CALCIUM 8.1* 7.5*  MG -- --  PHOS -- --    Basename 01/02/12 0620 01/01/12 0500  WBC 13.0* 13.6*  NEUTROABS -- --  HGB 11.0* 10.3*  HCT 31.3* 28.8*  MCV 88.9 88.1  PLT 312 248    Micro Results: Recent Results (from the past 240 hour(s))  URINE CULTURE     Status: Normal   Collection Time   12/24/11  6:43 PM      Component Value Range Status Comment   Specimen Description URINE, CATHETERIZED   Final    Special Requests NONE   Final    Culture  Setup Time 12/25/2011 01:56   Final    Colony Count >=100,000 COLONIES/ML   Final    Culture     Final    Value: METHICILLIN RESISTANT STAPHYLOCOCCUS AUREUS     Note: RIFAMPIN AND GENTAMICIN SHOULD NOT BE USED AS SINGLE DRUGS FOR TREATMENT OF STAPH INFECTIONS.     Note: CRITICAL RESULT  CALLED TO, READ BACK BY AND VERIFIED WITH: SIERRA JOHNSON @ 11:26AM  12/28/11 BY DWEEKS   Report Status 12/28/2011 FINAL   Final    Organism ID, Bacteria METHICILLIN RESISTANT STAPHYLOCOCCUS AUREUS   Final   CULTURE, BLOOD (ROUTINE X 2)     Status: Normal   Collection Time   12/27/11  9:10 AM      Component Value Range Status Comment   Specimen Description BLOOD ARM RIGHT   Final    Special Requests BOTTLES DRAWN AEROBIC AND ANAEROBIC 10CC   Final    Culture  Setup Time 12/27/2011 15:49   Final    Culture     Final    Value: STAPHYLOCOCCUS AUREUS     Note: SUSCEPTIBILITIES PERFORMED ON PREVIOUS CULTURE WITHIN THE LAST 5 DAYS.     Note: Gram Stain Report Called to,Read Back By and Verified With: Kennyth Arnold @ 1253 12/28/11 BY KRAWS   Report Status 12/30/2011 FINAL   Final   CULTURE, BLOOD (ROUTINE X 2)     Status: Normal   Collection Time   12/27/11  9:30 AM      Component  Value Range Status Comment   Specimen Description BLOOD HAND RIGHT   Final    Special Requests BOTTLES DRAWN AEROBIC AND ANAEROBIC 10CC   Final    Culture  Setup Time 12/27/2011 15:48   Final    Culture     Final    Value: METHICILLIN RESISTANT STAPHYLOCOCCUS AUREUS     Note: RIFAMPIN AND GENTAMICIN SHOULD NOT BE USED AS SINGLE DRUGS FOR TREATMENT OF STAPH INFECTIONS. CRITICAL RESULT CALLED TO, READ BACK BY AND VERIFIED WITH: MICHELLE M. @1400  12/29/11 BY KRAWS This organism DOES NOT demonstrate inducible Clindamycin      resistance in vitro.     Note: Gram Stain Report Called to,Read Back By and Verified With: Kennyth Arnold @ 1253 12/28/11 BY KRAWS   Report Status 12/30/2011 FINAL   Final    Organism ID, Bacteria METHICILLIN RESISTANT STAPHYLOCOCCUS AUREUS   Final   MRSA PCR SCREENING     Status: Abnormal   Collection Time   12/27/11 10:17 AM      Component Value Range Status Comment   MRSA by PCR POSITIVE (*) NEGATIVE Final   CULTURE, BLOOD (ROUTINE X 2)     Status: Normal (Preliminary result)   Collection Time    12/29/11 11:26 PM      Component Value Range Status Comment   Specimen Description BLOOD RIGHT ARM   Final    Special Requests BOTTLES DRAWN AEROBIC AND ANAEROBIC 10CC   Final    Culture  Setup Time 12/30/2011 04:13   Final    Culture     Final    Value:        BLOOD CULTURE RECEIVED NO GROWTH TO DATE CULTURE WILL BE HELD FOR 5 DAYS BEFORE ISSUING A FINAL NEGATIVE REPORT   Report Status PENDING   Incomplete   CULTURE, BLOOD (ROUTINE X 2)     Status: Normal (Preliminary result)   Collection Time   12/29/11 11:27 PM      Component Value Range Status Comment   Specimen Description BLOOD RIGHT HAND   Final    Special Requests BOTTLES DRAWN AEROBIC AND ANAEROBIC 10CC   Final    Culture  Setup Time 12/30/2011 04:13   Final    Culture     Final    Value:        BLOOD CULTURE RECEIVED NO GROWTH TO DATE CULTURE WILL BE HELD FOR 5 DAYS BEFORE ISSUING A FINAL NEGATIVE REPORT   Report Status PENDING   Incomplete     Studies/Results: Ct Abdomen Pelvis W Contrast  12/28/2011  *RADIOLOGY REPORT*  Clinical Data: Lower abdominal pain.  CT ABDOMEN AND PELVIS WITH CONTRAST   IMPRESSION:  1.  Periportal edema in the liver and possible early cirrhotic changes of the liver. 2.  Evidence of right heart failure with reflux of contrast into the IVC and hepatic veins. 3.  Scattered small amount of ascites throughout the peritoneal cavity without evidence of mass lesions or enlarged lymph nodes. 4.  Significant irregular wall thickening of the bladder.  This may relate to chronic outlet obstruction.  Other bladder pathology such as tumor or cystitis cannot be excluded.  Original Report Authenticated By: Reola Calkins, M.D.   Dg Chest Port 1 View  12/30/2011  *RADIOLOGY REPORT*  Clinical Data: Rapid breathing.  PORTABLE CHEST - 1 VIEW  Comparison: Chest 12/24/2011.   IMPRESSION: Marked worsening of right worse than left airspace disease and right pleural effusion.  Finding could be due to aspiration,  edema or  pneumonia.  These results will be called to the ordering clinician or representative by the Radiologist Assistant, and communication documented in the PACS Dashboard.  Original Report Authenticated By: Bernadene Bell. Maricela Curet, M.D.    Medications: I have reviewed the patient's current medications.  Assesment/Plan:  Pulmonary edema/CHF EF 25% Stable Continue  on po lasix 40 mg daily  Weakness generalized, failure to thrive:  probably related to infection. Continue with treatment.   Blood and Urine Cultures positive for MRSA.  MRSA Bacteremia.   Infectious disease on board. Last blood cultures have been negative 2 D echo did not show vegetations. Cardiology recommends no TEE due to increased risks picc line in for 6-8 weeks of antibiotics as per ID recommendations.  D/w Dr Orvan Falconer.  HYPOTHYROIDISM:  Continue with synthroid.   Orange colored urine He is on rifampin and pyridium , both of which change the urine color.  Atrial fibrillation:  Continue with coumadin per pharmacy.   Hyponatermia: Sodium is 125 Probably secondary to diuresis Will continue to monitor.  DEMENTIA :  continue with temazepam, aricept, namenda. ativan, per wife patient has been on both ativan and temazepam without problems with sedation.   Skin lower back buttock ulcer stage 2:  wound care consulted.  Following their recommendations.  Leukocytosis Improving  Suprapubic pain:  Resolved. Continue pyridium. Condom cath in place.  Disposition:  Patient from home where he lives with his wife Palliative care meeting done, patient is DNR. Plan to go to SNF with rehab     LOS: 10 days   Jerilynn Birkenhead [phone] [facility] 01/03/2012, 11:58 AM

## 2012-01-03 NOTE — Progress Notes (Signed)
ANTICOAGULATION CONSULT NOTE - Follow Up Consult  Pharmacy Consult for Coumadin Indication: atrial fibrillation  Allergies  Allergen Reactions  . Penicillins Hives  . Uroxatral (Alfuzosin Hydrochloride) Other (See Comments)    hypotension    Patient Measurements: Height: 5\' 6"  (167.6 cm) Weight: 130 lb 1.1 oz (59 kg) IBW/kg (Calculated) : 63.8   Vital Signs: Temp: 98.2 F (36.8 C) (07/22 0609) Temp src: Oral (07/22 0609) BP: 98/49 mmHg (07/22 0609) Pulse Rate: 78  (07/22 0609)  Labs:  [name] 01/03/12 1610 01/02/12 0620 01/01/12 0500 12/31/11 1034  HGB -- 11.0* 10.3* --  HCT -- 31.3* 28.8* 31.9*  PLT -- 312 248 204  APTT -- -- -- --  LABPROT 18.4* 20.0* 28.5* --  INR 1.50* 1.67* 2.63* --  HEPARINUNFRC -- -- -- --  CREATININE -- 0.64 0.60 0.47*  CKTOTAL -- -- -- --  CKMB -- -- -- --  TROPONINI -- -- -- --    Estimated Creatinine Clearance: 54.3 ml/min (by C-G formula based on Cr of 0.64).   Medications:  Scheduled:    . donepezil  5 mg Oral QHS  . feeding supplement  237 mL Oral BID BM  . fentaNYL  25 mcg Transdermal Q72H  . furosemide  40 mg Oral Daily  . levothyroxine  88 mcg Oral QAC breakfast  . LORazepam  1 mg Oral QHS  . memantine  10 mg Oral BID  . mirabegron ER  25 mg Oral Daily  . omega-3 acid ethyl esters  1 g Oral BID  . polyethylene glycol  17 g Oral Daily  . rifampin  600 mg Oral Daily  . silver sulfADIAZINE  1 application Topical Daily  . sodium chloride  10-40 mL Intracatheter Q12H  . sodium chloride  3 mL Intravenous Q12H  . Tamsulosin HCl  0.4 mg Oral QPC supper  . temazepam  15 mg Oral QHS  . vancomycin  500 mg Intravenous Q12H  . vitamin C  500 mg Oral Daily  . warfarin  5 mg Oral ONCE-1800  . Warfarin - Pharmacist Dosing Inpatient   Does not apply q1800  . DISCONTD: phenazopyridine  100 mg Oral TID WC  . DISCONTD: vancomycin  1,000 mg Intravenous Q12H  . DISCONTD: zolpidem  5 mg Oral Once    Assessment: 78 y/o male patient on  chronic coumadin for h/o afib. INR subtherapeutic today, not sure if dose given last night d/t [facility] outage. Noted drug interaction with rifampin will require higher doses of coumadin, 5mg  and 6mg  at home. No bleeding reported.  Goal of Therapy:  INR 2-3 Monitor platelets by anticoagulation protocol: Yes   Plan:  Coumadin 7.5mg  today and f/u daily protime.  Verlene Mayer, PharmD, BCPS Pager [phone] 01/03/2012,9:49 AM

## 2012-01-04 DIAGNOSIS — Z950 Presence of cardiac pacemaker: Secondary | ICD-10-CM

## 2012-01-04 LAB — PROTIME-INR
INR: 1.4 (ref 0.00–1.49)
Prothrombin Time: 17.4 seconds — ABNORMAL HIGH (ref 11.6–15.2)

## 2012-01-04 MED ORDER — WARFARIN SODIUM 7.5 MG PO TABS
7.5000 mg | ORAL_TABLET | Freq: Once | ORAL | Status: AC
  Administered 2012-01-04: 7.5 mg via ORAL
  Filled 2012-01-04: qty 1

## 2012-01-04 MED ORDER — HYDROMORPHONE HCL 2 MG PO TABS
4.0000 mg | ORAL_TABLET | ORAL | Status: DC | PRN
  Administered 2012-01-04: 4 mg via ORAL
  Filled 2012-01-04 (×2): qty 2

## 2012-01-04 NOTE — Clinical Social Work Placement (Addendum)
Clinical Social Work Department CLINICAL SOCIAL WORK PLACEMENT NOTE 01/04/2012  Patient:  [name], [name]  Account Number:  [id number] Admit date:  12/24/2011  Clinical Social Worker:  Genelle Bal, LCSW  Date/time:  01/04/2012 06:49 AM  Clinical Social Work is seeking post-discharge placement for this patient at the following level of care:   SKILLED NURSING   (*CSW will update this form in Epic as items are completed)   01/04/2012  Patient/family provided with [facility] Department of Clinical Social Work's list of facilities offering this level of care within the geographic area requested by the patient (or if unable, by the patient's family).  01/04/2012  Patient/family informed of their freedom to choose among providers that offer the needed level of care, that participate in Medicare, Medicaid or managed care program needed by the patient, have an available bed and are willing to accept the patient.    Patient/family informed of [facility]' ownership interest in [facility], as well as of the fact that they are under no obligation to receive care at this facility.  PASARR submitted to E[employer] on  PASARR number received from [employer] on   FL2 transmitted to all facilities in geographic area requested by pt/family on  01/04/2012 FL2 transmitted to all facilities within larger geographic area on   Patient informed that his/her managed care company has contracts with or will negotiate with  certain facilities, including the following:     Patient/family informed of bed offers received: 7/23 and 01/05/12  Patient chooses bed at [facility] Physician recommends and patient chooses bed at    Patient to be transferred to [facility] skilled nursing facility on 01/05/12   Patient to be transferred to facility by ambulance The following physician request were entered in Epic:   Additional Comments: 01/05/12 - DISCHARGE NOTE: Discharge information forwarded to facility. CSW  facilitated transport to SNF via ambulance. Wife aware of ambulance transport via phone call. **Patient had a prior PASARR number - [phone] [id number]

## 2012-01-04 NOTE — Progress Notes (Addendum)
Follow-up patient seen at bedside -caregiver Leavy Cella in room -patient c/o back & knee pain, stated 'please help me' restless in bed, brows furrowed -stated the oral pain medication (Oxycodone 5mg ) has not helped rated pain 7/10; caregiver stated " I feel he waited too long to ask for any pain medication and then it took a while to get it" spoke with staff RN Synetta Fail and Dr Wonda Cheng given 5 mg Oxycodone @ 9:33 am before that the last dose IV OR oral pain med was last evening- patient has Fentanyl patch 25 mcg that was placed 7/21 and prn IV Dilaudid order in place; after reviewing  with Dr Phillips Odor- requested staff RN to give 0.5 mg IV Dilaudid at this time and monitor- Dr Phillips Odor will review pain management and follow-up with patient symptom control this afternoon.  Dr Sharl Ma also aware.    Valente David, RN 01/04/2012, 10:43 AM Palliative Medicine Team RN Liaison [phone]

## 2012-01-04 NOTE — Progress Notes (Addendum)
Patient ID: [name], male   DOB: August 15, 1922, 78 y.o.   MRN: [id number]   Subjective:  78 year old male with multiple medical problems including CHF, status post pacemaker, hypothyroidism, anemia, atrial fibrillation, who was diagnosed with MRSA UTI 1 month ago and was treated for it, patient improved transiently and was admitted with profound weakness and suprapubic tenderness, but the urine culture growing MRSA, he also had blood cultures which grew gram-positive cocci in clusters. Which was confirmed as MRSA. Patient was seen by infectious disease, transthoracic echo was ordered which did not show any vegetations. In the meantime patient became short of breath with altered mental status, and had to transfer to step down. The exit of the chest showed pulmonary edema requiring IV Lasix. Patient improved with diuresis and was transferred back to floor. He also required pyridium for bladder spasms, Foley catheter was removed and a condom cath was placed. Palliative care consultation was obtained, and patient is a full DO NOT RESUSCITATE at this time. Patient has chronic pain and his pain medications were adjusted by palliative care. A cardiology consultation was obtained, and they did not recommend transesophageal echo to look for vegetations due to multiple medical problems. Patient will require IV vancomycin and rifampin for a minimum of 6 weeks. Patient agrees to go to skilled nursing facility, and social work is helping to find SNF, he has a PICC line in place and will be transferred once bed  becomes available.  Patient complains of pain today. Palliative care has adjusted the pain medications.  Objective: Filed Vitals:   01/03/12 1819 01/03/12 2219 01/04/12 0603 01/04/12 0930  BP: 100/48 110/35 99/59 102/63  Pulse: 77 71 74 79  Temp: 98.4 F (36.9 C) 98.1 F (36.7 C) 98.2 F (36.8 C) 98.4 F (36.9 C)  TempSrc: Oral Oral Oral Oral  Resp: 17 18 18 18   Height:      Weight:  50.7 kg (111  lb 12.4 oz)    SpO2: 97% 100% 93% 95%   Weight change: -8.3 kg (-18 lb 4.8 oz)   General: Appears in mild distress HEENT: No bruits, no goiter.  Heart: S1S2 RRR Lungs: Clear bilaterally Abdomen: Soft, minimal distention, positive bowel sounds, non tender to palpation Neuro: Alert oriented x 3 Extremities; no edema.   Lab Results:  [facility] 01/02/12 0620  NA 125*  K 4.0  CL 91*  CO2 22  GLUCOSE 83  BUN 16  CREATININE 0.64  CALCIUM 8.1*  MG --  PHOS --    Basename 01/02/12 0620  WBC 13.0*  NEUTROABS --  HGB 11.0*  HCT 31.3*  MCV 88.9  PLT 312    Micro Results: Recent Results (from the past 240 hour(s))  CULTURE, BLOOD (ROUTINE X 2)     Status: Normal   Collection Time   12/27/11  9:10 AM      Component Value Range Status Comment   Specimen Description BLOOD ARM RIGHT   Final    Special Requests BOTTLES DRAWN AEROBIC AND ANAEROBIC 10CC   Final    Culture  Setup Time 12/27/2011 15:49   Final    Culture     Final    Value: STAPHYLOCOCCUS AUREUS     Note: SUSCEPTIBILITIES PERFORMED ON PREVIOUS CULTURE WITHIN THE LAST 5 DAYS.     Note: Gram Stain Report Called to,Read Back By and Verified With: Kennyth Arnold @ 1253 12/28/11 BY KRAWS   Report Status 12/30/2011 FINAL   Final   CULTURE, BLOOD (ROUTINE  X 2)     Status: Normal   Collection Time   12/27/11  9:30 AM      Component Value Range Status Comment   Specimen Description BLOOD HAND RIGHT   Final    Special Requests BOTTLES DRAWN AEROBIC AND ANAEROBIC 10CC   Final    Culture  Setup Time 12/27/2011 15:48   Final    Culture     Final    Value: METHICILLIN RESISTANT STAPHYLOCOCCUS AUREUS     Note: RIFAMPIN AND GENTAMICIN SHOULD NOT BE USED AS SINGLE DRUGS FOR TREATMENT OF STAPH INFECTIONS. CRITICAL RESULT CALLED TO, READ BACK BY AND VERIFIED WITH: MICHELLE M. @1400  12/29/11 BY KRAWS This organism DOES NOT demonstrate inducible Clindamycin      resistance in vitro.     Note: Gram Stain Report Called to,Read Back By  and Verified With: Kennyth Arnold @ 1253 12/28/11 BY KRAWS   Report Status 12/30/2011 FINAL   Final    Organism ID, Bacteria METHICILLIN RESISTANT STAPHYLOCOCCUS AUREUS   Final   MRSA PCR SCREENING     Status: Abnormal   Collection Time   12/27/11 10:17 AM      Component Value Range Status Comment   MRSA by PCR POSITIVE (*) NEGATIVE Final   CULTURE, BLOOD (ROUTINE X 2)     Status: Normal (Preliminary result)   Collection Time   12/29/11 11:26 PM      Component Value Range Status Comment   Specimen Description BLOOD RIGHT ARM   Final    Special Requests BOTTLES DRAWN AEROBIC AND ANAEROBIC 10CC   Final    Culture  Setup Time 12/30/2011 04:13   Final    Culture     Final    Value:        BLOOD CULTURE RECEIVED NO GROWTH TO DATE CULTURE WILL BE HELD FOR 5 DAYS BEFORE ISSUING A FINAL NEGATIVE REPORT   Report Status PENDING   Incomplete   CULTURE, BLOOD (ROUTINE X 2)     Status: Normal (Preliminary result)   Collection Time   12/29/11 11:27 PM      Component Value Range Status Comment   Specimen Description BLOOD RIGHT HAND   Final    Special Requests BOTTLES DRAWN AEROBIC AND ANAEROBIC 10CC   Final    Culture  Setup Time 12/30/2011 04:13   Final    Culture     Final    Value:        BLOOD CULTURE RECEIVED NO GROWTH TO DATE CULTURE WILL BE HELD FOR 5 DAYS BEFORE ISSUING A FINAL NEGATIVE REPORT   Report Status PENDING   Incomplete     Studies/Results: Ct Abdomen Pelvis W Contrast  12/28/2011  *RADIOLOGY REPORT*  Clinical Data: Lower abdominal pain.  CT ABDOMEN AND PELVIS WITH CONTRAST   IMPRESSION:  1.  Periportal edema in the liver and possible early cirrhotic changes of the liver. 2.  Evidence of right heart failure with reflux of contrast into the IVC and hepatic veins. 3.  Scattered small amount of ascites throughout the peritoneal cavity without evidence of mass lesions or enlarged lymph nodes. 4.  Significant irregular wall thickening of the bladder.  This may relate to chronic outlet  obstruction.  Other bladder pathology such as tumor or cystitis cannot be excluded.  Original Report Authenticated By: Reola Calkins, M.D.   Dg Chest Port 1 View  12/30/2011  *RADIOLOGY REPORT*  Clinical Data: Rapid breathing.  PORTABLE CHEST - 1 VIEW  Comparison:  Chest 12/24/2011.   IMPRESSION: Marked worsening of right worse than left airspace disease and right pleural effusion.  Finding could be due to aspiration, edema or pneumonia.  These results will be called to the ordering clinician or representative by the Radiologist Assistant, and communication documented in the PACS Dashboard.  Original Report Authenticated By: Bernadene Bell. Maricela Curet, M.D.    Medications: I have reviewed the patient's current medications.  Assesment/Plan:  Chronic back pain Duragesic patch 25 mcg q 72 hr Oxycodone IR 5 mg po Q 4 hr prn Palliative care following for pain management   Acute Pulmonary edema Resolved  CHF (systolic dysfunction)  EF 25% euvolemic Continue  on po lasix 40 mg daily  Weakness generalized, failure to thrive:  probably related to infection. Continue with treatment.   MRSA bacteremia MRSA Bacteremia.   Infectious disease on board. Last blood cultures have been negative 2 D echo did not show vegetations. Cardiology recommends no TEE due to increased risks picc line in for 6-8 weeks of antibiotics as per ID recommendations.  D/w Dr Orvan Falconer.  Sepsis with UTI Urine culture is positive for MRSA On vancomycin  HYPOTHYROIDISM:  Continue with synthroid.   Orange colored urine He is on rifampin and pyridium , both of which change the urine color.  Atrial fibrillation:  Continue with coumadin per pharmacy.   Hyponatermia: Sodium is 125 Probably secondary to diuresis Will continue to monitor.  DEMENTIA :  continue with temazepam, aricept, namenda. ativan, per wife patient has been on both ativan and temazepam without problems with sedation.   Skin lower back buttock  ulcer stage 2:  wound care consulted.  Following their recommendations.  Leukocytosis Improving  Suprapubic pain:  Resolved. Continue pyridium. Condom cath in place.  Disposition:  Patient from home where he lives with his wife Palliative care meeting done, patient is DNR. Plan to go to SNF with rehab     LOS: 11 days   Jerilynn Birkenhead [phone] [facility] 01/04/2012, 1:24 PM

## 2012-01-04 NOTE — Clinical Documentation Improvement (Signed)
SEPSIS DOCUMENTATION QUERY  THIS DOCUMENT IS NOT A PERMANENT PART OF THE MEDICAL RECORD  TO RESPOND TO THE THIS QUERY, FOLLOW THE INSTRUCTIONS BELOW:  1. If needed, update documentation for the patient's encounter via the notes activity.  2. Access this query again and click edit on the In [employer].  3. After updating, or not, click F2 to complete all highlighted (required) fields concerning your review. Select "additional documentation in the medical record" OR "no additional documentation provided".  4. Click Sign note button.  5. The deficiency will fall out of your In Basket *Please let us know if you are not able to complete this workflow by phone or e-mail (listed below).  Please update your documentation within the medical record to reflect your response to this query.                                                                                    01/04/12  Dear Dr. [facility],  In a better effort to capture your patient's severity of illness, reflect appropriate length of stay and utilization of resourcIes, a review of the patient medical record has revealed the following indicators.    Based on your clinical judgment, please clarify and document in a progress note and/or discharge summary the clinical condition associated with the following supporting information:  In responding to this query please exercise your independent judgment.  The fact that a query is asked, does not imply that any particular answer is desired or expected.   "MRSA Bacteremia" has been documented in the notes.  If there is a more specific condition that best reflects the pt's condition during this admission, please document it in the progress notes and discharge summary. Also document if condition was present on admission.Marland KitchenMarland KitchenMarland KitchenThanks   Possible Clinical Conditions  Septicemia / Sepsis    Sepsis with UTI  Pneumonia  Other Condition   Cannot clinically Determine    Risk  Factors: "Was diagnosed with MRSA UTI 1 month ago and was treated for it" per notes Continuous antibiotic coverage (9days) "Staphylococcus aureus bacteremia" per notes "CXR shows pulmonary edema vs pneumonia, patient is already on vancomycin for MRSA bacteremia" per notes  Presenting Signs and Symptoms: "Profound weakness" per notes  "Fever" 103 in ER "Multiple sacral/buttocks ulcers without ttp, pus drainage" per notes "Lethargic, responds to verbal stimuli" per notes ( improved) "Bibasilar crackles" per assessment notes (clear to date) Transient hypotension (from admission to present)   Diagnostics: Results for EASTER, SCHINKE (MRN [id number]) as of 01/04/2012 14:02  Ref. Range 12/28/2011 05:32 12/30/2011 09:17 12/31/2011 10:34 01/01/2012 05:00 01/02/2012 06:20  WBC Latest Range: 4.0-10.5 K/uL 12.1 (H) 16.8 (H) 15.6 (H) 13.6 (H) 13.0 (H)    Chest x-ray on 7/19 Findings: Right arm PICC with its tip in the lower SVC Patchy bilateral lower lobe opacities, likely reflecting a  combination of atelectasis and layering pleural effusions, right  lower lobe pneumonia not excluded. No pneumothorax.  The heart is top normal in size. Left subclavian pacemaker.  IMPRESSION:  Right arm PICC with its tip in the lower SVC.  Patchy bilateral lower lobe opacities, likely reflecting a  combination  of atelectasis and layering pleural effusions, right  lower lobe pneumonia not excluded   Treatment: I will continue vancomycin and rifampin for a minimum of 6 weeks with an attempt to cure her his MRSA bacteremia and salvage his pacemaker.  Aspiration precautions Levaquin 500mg  IVPB x 3 doses Continuous monitoring of labs and vital signs   Reviewed: Query answered  Thank You,   Rossie Muskrat RN, BSN  Clinical Documentation Specialist Pager:  [phone] [email or web address]  Health Information Management [place]

## 2012-01-04 NOTE — Clinical Social Work Psychosocial (Signed)
Clinical Social Work Department BRIEF PSYCHOSOCIAL ASSESSMENT 01/04/2012  Patient:  DYLAND, PANUCO     Account Number:  [id number]     Admit date:  12/24/2011  Clinical Social Worker:  ,  Date/Time:  01/04/2012 06:35 AM  Referred by:  Physician  Date Referred:  01/02/2012 Referred for  SNF Placement   Other Referral:   Interview type:  Family Other interview type:   CSW talked with wife regarding discharge planning.    PSYCHOSOCIAL DATA Living Status:  WIFE Admitted from facility:   Level of care:   Primary support name:  Moustapha Tooker Primary support relationship to patient:  SPOUSE Degree of support available:   Strong support. Patient also has aides to assist patient at home and one of the aides is with patient while hospitalized.    CURRENT CONCERNS Current Concerns  Post-Acute Placement   Other Concerns:    SOCIAL WORK ASSESSMENT / PLAN CSW talked with Mrs. Hebb, who was at the patient's bedside regarding SNF placement for short-term rehab. Her preference is [facility] [place] as they have both been at this facility before for rehab. She recounted to Child psychotherapist the history of what caused both of them to be there for rehab. Mrs. Gacek indicated that if for any reason [facility] is unable to take patient, her second choice is [facility].   Assessment/plan status:   Other assessment/ plan:   Information/referral to community resources:    PATIENT'S/FAMILY'S RESPONSE TO PLAN OF CARE: Mr. Godley is very pleasant and did greet this SW. Mrs. Scullion is also very pleasant and concerned about the patient and him getting the care he needs at the hospital and while in rehab. CSW advised Mrs. Nardelli that contact will be made with the admissions director at [facility] and she will be advised of the outcome.

## 2012-01-04 NOTE — Clinical Documentation Improvement (Signed)
CHF DOCUMENTATION CLARIFICATION QUERY  THIS DOCUMENT IS NOT A PERMANENT PART OF THE MEDICAL RECORD  TO RESPOND TO THE THIS QUERY, FOLLOW THE INSTRUCTIONS BELOW:  1. If needed, update documentation for the patient's encounter via the notes activity.  2. Access this query again and click edit on the In [employer].  3. After updating, or not, click F2 to complete all highlighted (required) fields concerning your review. Select "additional documentation in the medical record" OR "no additional documentation provided".  4. Click Sign note button.  5. The deficiency will fall out of your In Basket *Please let us know if you are not able to complete this workflow by phone or e-mail (listed below).  Please update your documentation within the medical record to reflect your response to this query.                                                                                    01/04/12  Dear Dr. [facility],  In a better effort to capture your patient's severity of illness, reflect appropriate length of stay and utilization of resources, a review of the patient medical record has revealed the following indicators the diagnosis of Heart Failure.    Based on your clinical judgment, please clarify and document in a progress note and/or discharge summary the clinical condition associated with the following supporting information:  In responding to this query please exercise your independent judgment.  The fact that a query is asked, does not imply that any particular answer is desired or expected.  "Pulmonary edema/CHF EF 25%" has been documented in the progress notes.  Please specify further by documenting a type of CHF and the acuity.    Possible Clinical Conditions  Chronic Systolic Congestive Heart Failure  Acute on Chronic Systolic Congestive Heart Failure  Acute Pulmonary Edema  Other Condition  Cannot Clinically Determine  Supporting Information: CXR shows  pulmonary edema vs pneumonia, patient is already on vancomycin for MRSA bacteremia  Risk Factors Hx CHF, A-Fib w/pacemaker  Signs & Symptoms:  Decreased level of consciousness, tachypnea and blood pressure dropping per 7/18 progress note (all appear to be improving)  "Bibasilar crackles" per 7/18 progress notes   Diagnostics: Chest xray done on 12/1911 IMPRESSION:  Right arm PICC with its tip in the lower SVC.  Patchy bilateral lower lobe opacities, likely reflecting a  combination of atelectasis and layering pleural effusions, right  lower lobe pneumonia not excluded.    CT ABD/Pelvis on 7/16 IMPRESSION:  1. Periportal edema in the liver and possible early cirrhotic  changes of the liver.  2. Evidence of right heart failure with reflux of contrast into  the IVC and hepatic veins.  3. Scattered small amount of ascites throughout the peritoneal  cavity without evidence of mass lesions or enlarged lymph nodes.  4. Significant irregular wall thickening of the bladder. This may  relate to chronic outlet obstruction. Other bladder pathology such  as tumor or cystitis cannot be excluded  2DEcho on 7/18 Study Conclusions - Left ventricle: The cavity size was normal. Systolic function was severely reduced. The estimated ejection fraction was in the range of 20% to  25%. There is akinesis of the mid-distalanteroseptal, anterior, and apical myocardium; consistent with infarction. - Aortic valve: Moderately thickened, moderately calcified leaflets. There was moderate stenosis. Moderate regurgitation. Valve area: 0.56cm^2(VTI). Valve area: 0.56cm^2 (Vmax). Valve area: 0.55cm^2 (Vmean). - Mitral valve: Moderately to severely calcified annulus. Moderate to severe regurgitation. - Left atrium: The atrium was moderately to severely dilated. - Right atrium: The atrium was mildly dilated. - Tricuspid valve: Moderate regurgitation. - Pulmonary arteries: PA peak pressure: 40mm Hg  (S). Impressions:  - Vegetation cannot be excluded  Treatment: Lasix 40mg  po daily Lasix 40mg  IVP x1 Daily weights and I&O Frequent assessments and monitoring of labs  Reviewed: Query answered  Thank You,   Rossie Muskrat RN, BSN  Clinical Documentation Specialist Pager:  [phone] [email or web address]  Health Information Management [place]

## 2012-01-04 NOTE — Clinical Social Work Note (Signed)
CSW made contact with Mrs. Behring by phone to advise her that according to Grayce Sessions at [facility] [place], they will not be able to take patient for short-term rehab. Advised wife that I will make contact with [facility] on Wednesday.  Genelle Bal, MSW, LCSW [phone]

## 2012-01-04 NOTE — Progress Notes (Signed)
ANTICOAGULATION CONSULT NOTE - Follow Up Consult  Pharmacy Consult for Coumadin Indication: atrial fibrillation  Allergies  Allergen Reactions  . Penicillins Hives  . Uroxatral (Alfuzosin Hydrochloride) Other (See Comments)    hypotension    Patient Measurements: Height: 5\' 6"  (167.6 cm) Weight: 111 lb 12.4 oz (50.7 kg) IBW/kg (Calculated) : 63.8    Vital Signs: Temp: 98.4 F (36.9 C) (07/23 0930) Temp src: Oral (07/23 0930) BP: 102/63 mmHg (07/23 0930) Pulse Rate: 79  (07/23 0930)  Labs:  [name] 01/04/12 0547 01/03/12 9604 01/02/12 0620  HGB -- -- 11.0*  HCT -- -- 31.3*  PLT -- -- 312  APTT -- -- --  LABPROT 17.4* 18.4* 20.0*  INR 1.40 1.50* 1.67*  HEPARINUNFRC -- -- --  CREATININE -- -- 0.64  CKTOTAL -- -- --  CKMB -- -- --  TROPONINI -- -- --    Estimated Creatinine Clearance: 46.7 ml/min (by C-G formula based on Cr of 0.64).  Assessment: Patient is an 78 y.o. M on coumadin for Afib.  INR is trending down to 1.40.  This is likely d/t drug-drug intxn with Rifampin.  With Rifampin being an inducer, will likely need higher coumadin dose to maintain therapeutic INR level.   Goal of Therapy:  INR 2-3   Plan:  1) Repeat coumadin 7.5mg  PO x1   [name] P 01/04/2012,11:08 AM

## 2012-01-04 NOTE — Progress Notes (Signed)
Progress Note from the Palliative Medicine Team at [facility]  Subjective: Slightly more SOB with OOB/activity. Pain remains a significant issue, he has required IV dilaudid for pain especially with new mobility. Generalized weakness -No other complaints  Objective: Allergies  Allergen Reactions  . Penicillins Hives  . Uroxatral (Alfuzosin Hydrochloride) Other (See Comments)    hypotension   Scheduled Meds:   . donepezil  5 mg Oral QHS  . feeding supplement  237 mL Oral BID BM  . fentaNYL  25 mcg Transdermal Q72H  . furosemide  40 mg Oral Daily  . levothyroxine  88 mcg Oral QAC breakfast  . LORazepam  1 mg Oral QHS  . memantine  10 mg Oral BID  . mirabegron ER  25 mg Oral Daily  . omega-3 acid ethyl esters  1 g Oral BID  . polyethylene glycol  17 g Oral Daily  . rifampin  600 mg Oral Daily  . silver sulfADIAZINE  1 application Topical Daily  . sodium chloride  10-40 mL Intracatheter Q12H  . sodium chloride  3 mL Intravenous Q12H  . Tamsulosin HCl  0.4 mg Oral QPC supper  . temazepam  15 mg Oral QHS  . vancomycin  1,000 mg Intravenous Q24H  . vitamin C  500 mg Oral Daily  . warfarin  5 mg Oral ONCE-1800  . warfarin  7.5 mg Oral ONCE-1800  . warfarin  7.5 mg Oral ONCE-1800  . Warfarin - Pharmacist Dosing Inpatient   Does not apply q1800   Continuous Infusions:   . sodium chloride 20 mL/hr (12/31/11 2332)   PRN Meds:.acetaminophen, acetaminophen, alum & mag hydroxide-simeth, HYDROmorphone (DILAUDID) injection, HYDROmorphone, ondansetron (ZOFRAN) IV, ondansetron, oxyCODONE, sodium chloride, sodium phosphate  BP 134/81  Pulse 85  Temp 98 F (36.7 C) (Oral)  Resp 18  Ht 5\' 6"  (1.676 m)  Wt 50.7 kg (111 lb 12.4 oz)  BMI 18.04 kg/m2  SpO2 97%   Pain Score: 7/10  Pain Location back, legs   Intake/Output Summary (Last 24 hours) at 01/04/12 1528 Last data filed at 01/04/12 1305  Gross per 24 hour  Intake    480 ml  Output   2650 ml  Net  -2170 ml       ZOX:[id number]   Stool Softner:yes  Physical Exam: Alert, talkative, pain better after given 1 dose of .5 Dilaudid. Move all extremities, sitting in chair Decreased breath sounds, fine crackles.  Labs: CBC    Component Value Date/Time   WBC 13.0* 01/02/2012 0620   WBC 5.1 04/10/2008 1526   RBC 3.52* 01/02/2012 0620   RBC 4.00* 04/10/2008 1526   HGB 11.0* 01/02/2012 0620   HGB 13.6 04/10/2008 1526   HCT 31.3* 01/02/2012 0620   HCT 39.0 04/10/2008 1526   PLT 312 01/02/2012 0620   PLT 224 04/10/2008 1526   MCV 88.9 01/02/2012 0620   MCV 97.5 04/10/2008 1526   MCH 31.3 01/02/2012 0620   MCH 34.1* 04/10/2008 1526   MCHC 35.1 01/02/2012 0620   MCHC 34.9 04/10/2008 1526   RDW 14.7 01/02/2012 0620   RDW 13.7 04/10/2008 1526   LYMPHSABS 0.7 12/24/2011 1708   LYMPHSABS 1.2 04/10/2008 1526   MONOABS 0.9 12/24/2011 1708   MONOABS 0.7 04/10/2008 1526   EOSABS 0.0 12/24/2011 1708   EOSABS 0.1 04/10/2008 1526   BASOSABS 0.0 12/24/2011 1708   BASOSABS 0.0 04/10/2008 1526    BMET    Component Value Date/Time   NA 125* 01/02/2012 8119  K 4.0 01/02/2012 0620   CL 91* 01/02/2012 0620   CO2 22 01/02/2012 0620   GLUCOSE 83 01/02/2012 0620   BUN 16 01/02/2012 0620   CREATININE 0.64 01/02/2012 0620   CALCIUM 8.1* 01/02/2012 0620   GFRNONAA 86* 01/02/2012 0620   GFRAA >90 01/02/2012 0620    CMP     Component Value Date/Time   NA 125* 01/02/2012 0620   K 4.0 01/02/2012 0620   CL 91* 01/02/2012 0620   CO2 22 01/02/2012 0620   GLUCOSE 83 01/02/2012 0620   BUN 16 01/02/2012 0620   CREATININE 0.64 01/02/2012 0620   CALCIUM 8.1* 01/02/2012 0620   PROT 5.3* 01/01/2012 0500   ALBUMIN 2.3* 01/01/2012 0500   AST 23 01/01/2012 0500   ALT 19 01/01/2012 0500   ALKPHOS 125* 01/01/2012 0500   BILITOT 1.2 01/01/2012 0500   GFRNONAA 86* 01/02/2012 0620   GFRAA >90 01/02/2012 0620  1  Assessment and Plan: 1. Code Status: 1. DNR 2. Symptom Control: 1. Chronic Pain: Added oral Dilaudid q4 hours. Will d/c other  prns to simplify regimen. 3. Psycho/Social: 1. Good family support, daughter is physician, goals clear. 4. Spiritual: 1. No current needs expressed 5. Disposition: 1. Plan is still for SNF for Rehab, SW consulted -family want [facility] to follow 2. Will continue to work on pain management issues to help with his rehab attempt.  Patient Documents Completed or Given: Document Given Completed  Advanced Directives Pkt    MOST    DNR    Gone from My Sight    Hard Choices      Time In Time Out Total Time Spent with Patient Total Overall Time         Greater than 50%  of this time was spent counseling and coordinating care related to the above assessment and plan.     1

## 2012-01-04 NOTE — Progress Notes (Signed)
Physical Therapy Treatment Patient Details Name: [name] MRN: [id number] DOB: 04-Jan-1923 Today's Date: 01/04/2012 Time: 9528-4132 PT Time Calculation (min): 29 min  PT Assessment / Plan / Recommendation Comments on Treatment Session  Pt admitted with fever but continues to improve with mobility and able to ambulate to the door today. Pt continues to have bowel and bladder incontinence but able to contain stool with brief until end of ambulation then perform pericare. Pt encouraged to continue HEP and mobilizing OOB with staff. Pt aide present during sessino as well.     Follow Up Recommendations       Barriers to Discharge        Equipment Recommendations       Recommendations for Other Services    Frequency     Plan Discharge plan remains appropriate;Frequency remains appropriate    Precautions / Restrictions Precautions Precautions: Fall Precaution Comments: incontinent   Pertinent Vitals/Pain Back pain, RN notified and medicated    Mobility  Bed Mobility Bed Mobility: Supine to Sit;Sitting - Scoot to Edge of Bed Supine to Sit: 4: Min assist;HOB flat;With rails Sitting - Scoot to Edge of Bed: 3: Mod assist Details for Bed Mobility Assistance: assist to elevate trunk from surface and to bring legs to EOB and use of pad to scoot pelvis to EOB Transfers Transfers: Sit to Stand;Stand to Sit Sit to Stand: 3: Mod assist;From bed;From chair/3-in-1 (+1 for safety) Stand to Sit: 3: Mod assist;To chair/3-in-1 Details for Transfer Assistance: cueing for hand placement and anterior translation with assist to stand from bed and chair. Pt stood grossly , with RW before and after ambulation for pericare and placement and removal of adult brief.  Ambulation/Gait Ambulation/Gait Assistance: 4: Min assist (+1 for lines and chair) Ambulation Distance (Feet): 25 Feet Assistive device: Rolling walker Ambulation/Gait Assistance Details: cueing for posture and assist to direct  RW Gait Pattern: Shuffle;Trunk flexed Gait velocity: decreased    Exercises General Exercises - Lower Extremity Long Arc Quad: AAROM;Both;10 reps;Seated Hip Flexion/Marching: AROM;Both;10 reps;Seated   PT Diagnosis:    PT Problem List:   PT Treatment Interventions:     PT Goals Acute Rehab PT Goals Pt will go Supine/Side to Sit: with supervision PT Goal: Supine/Side to Sit - Progress: Updated due to goal met PT Goal: Sit to Stand - Progress: Progressing toward goal PT Goal: Stand to Sit - Progress: Progressing toward goal Pt will Ambulate: 16 - 50 feet;with min assist;with rolling walker PT Goal: Ambulate - Progress: Updated due to goal met  Visit Information  Last PT Received On: 01/04/12 Assistance Needed: +2    Subjective Data  Subjective: my back is hurting   Cognition  Overall Cognitive Status: History of cognitive impairments - at baseline Area of Impairment: Memory Arousal/Alertness: Awake/alert Orientation Level: Appears intact for tasks assessed Behavior During Session: [facility] for tasks performed    Balance  Static Sitting Balance Static Sitting - Balance Support: Feet supported;Left upper extremity supported Static Sitting - Level of Assistance: 3: Mod assist Static Sitting - Comment/# of Minutes: pt continues to have left posterior lean in sitting and requires support and cueing for midline positioning Static Standing Balance Static Standing - Balance Support: Bilateral upper extremity supported Static Standing - Level of Assistance: 3: Mod assist  End of Session PT - End of Session Equipment Utilized During Treatment: Gait belt Activity Tolerance: Patient tolerated treatment well Patient left: in chair;with call bell/phone within reach Nurse Communication: Mobility status   GP  Toney Sang Beth 01/04/2012, 2:26 PM Delaney Meigs, PT [phone]

## 2012-01-04 NOTE — Progress Notes (Signed)
Utilization review completed.  

## 2012-01-05 DIAGNOSIS — N390 Urinary tract infection, site not specified: Secondary | ICD-10-CM | POA: Diagnosis not present

## 2012-01-05 DIAGNOSIS — I5023 Acute on chronic systolic (congestive) heart failure: Secondary | ICD-10-CM | POA: Diagnosis not present

## 2012-01-05 DIAGNOSIS — R0902 Hypoxemia: Secondary | ICD-10-CM | POA: Diagnosis not present

## 2012-01-05 DIAGNOSIS — R5383 Other fatigue: Secondary | ICD-10-CM | POA: Diagnosis not present

## 2012-01-05 DIAGNOSIS — A4901 Methicillin susceptible Staphylococcus aureus infection, unspecified site: Secondary | ICD-10-CM

## 2012-01-05 DIAGNOSIS — R109 Unspecified abdominal pain: Secondary | ICD-10-CM | POA: Diagnosis not present

## 2012-01-05 DIAGNOSIS — F3289 Other specified depressive episodes: Secondary | ICD-10-CM | POA: Diagnosis not present

## 2012-01-05 DIAGNOSIS — L89109 Pressure ulcer of unspecified part of back, unspecified stage: Secondary | ICD-10-CM | POA: Diagnosis not present

## 2012-01-05 DIAGNOSIS — A4159 Other Gram-negative sepsis: Secondary | ICD-10-CM | POA: Diagnosis not present

## 2012-01-05 DIAGNOSIS — F0390 Unspecified dementia without behavioral disturbance: Secondary | ICD-10-CM | POA: Diagnosis not present

## 2012-01-05 DIAGNOSIS — E039 Hypothyroidism, unspecified: Secondary | ICD-10-CM | POA: Diagnosis not present

## 2012-01-05 DIAGNOSIS — A4902 Methicillin resistant Staphylococcus aureus infection, unspecified site: Secondary | ICD-10-CM | POA: Diagnosis not present

## 2012-01-05 DIAGNOSIS — M545 Low back pain, unspecified: Secondary | ICD-10-CM | POA: Diagnosis not present

## 2012-01-05 DIAGNOSIS — B351 Tinea unguium: Secondary | ICD-10-CM | POA: Diagnosis not present

## 2012-01-05 DIAGNOSIS — G8929 Other chronic pain: Secondary | ICD-10-CM | POA: Diagnosis not present

## 2012-01-05 DIAGNOSIS — I4891 Unspecified atrial fibrillation: Secondary | ICD-10-CM | POA: Diagnosis not present

## 2012-01-05 DIAGNOSIS — Z5189 Encounter for other specified aftercare: Secondary | ICD-10-CM | POA: Diagnosis not present

## 2012-01-05 DIAGNOSIS — F329 Major depressive disorder, single episode, unspecified: Secondary | ICD-10-CM | POA: Diagnosis not present

## 2012-01-05 DIAGNOSIS — M79609 Pain in unspecified limb: Secondary | ICD-10-CM | POA: Diagnosis not present

## 2012-01-05 DIAGNOSIS — R627 Adult failure to thrive: Secondary | ICD-10-CM | POA: Diagnosis not present

## 2012-01-05 DIAGNOSIS — G003 Staphylococcal meningitis: Secondary | ICD-10-CM | POA: Diagnosis not present

## 2012-01-05 DIAGNOSIS — R7881 Bacteremia: Secondary | ICD-10-CM | POA: Diagnosis not present

## 2012-01-05 DIAGNOSIS — R630 Anorexia: Secondary | ICD-10-CM | POA: Diagnosis not present

## 2012-01-05 DIAGNOSIS — M4800 Spinal stenosis, site unspecified: Secondary | ICD-10-CM

## 2012-01-05 DIAGNOSIS — R269 Unspecified abnormalities of gait and mobility: Secondary | ICD-10-CM | POA: Diagnosis not present

## 2012-01-05 DIAGNOSIS — A419 Sepsis, unspecified organism: Secondary | ICD-10-CM | POA: Diagnosis not present

## 2012-01-05 DIAGNOSIS — M25569 Pain in unspecified knee: Secondary | ICD-10-CM | POA: Diagnosis not present

## 2012-01-05 DIAGNOSIS — F0280 Dementia in other diseases classified elsewhere without behavioral disturbance: Secondary | ICD-10-CM | POA: Diagnosis not present

## 2012-01-05 DIAGNOSIS — J811 Chronic pulmonary edema: Secondary | ICD-10-CM | POA: Diagnosis not present

## 2012-01-05 DIAGNOSIS — J9600 Acute respiratory failure, unspecified whether with hypoxia or hypercapnia: Secondary | ICD-10-CM | POA: Diagnosis not present

## 2012-01-05 DIAGNOSIS — M159 Polyosteoarthritis, unspecified: Secondary | ICD-10-CM | POA: Diagnosis not present

## 2012-01-05 DIAGNOSIS — B958 Unspecified staphylococcus as the cause of diseases classified elsewhere: Secondary | ICD-10-CM | POA: Diagnosis not present

## 2012-01-05 LAB — BASIC METABOLIC PANEL
BUN: 17 mg/dL (ref 6–23)
CO2: 25 mEq/L (ref 19–32)
Calcium: 8.6 mg/dL (ref 8.4–10.5)
Creatinine, Ser: 0.67 mg/dL (ref 0.50–1.35)
GFR calc non Af Amer: 84 mL/min — ABNORMAL LOW (ref >90)
Glucose, Bld: 81 mg/dL (ref 70–99)

## 2012-01-05 LAB — CBC
MCH: 31.8 pg (ref 26.0–34.0)
MCHC: 35.1 g/dL (ref 30.0–36.0)
MCV: 90.8 fL (ref 78.0–100.0)
Platelets: 334 10*3/uL (ref 150–400)
RBC: 3.14 MIL/uL — ABNORMAL LOW (ref 4.22–5.81)

## 2012-01-05 LAB — CULTURE, BLOOD (ROUTINE X 2): Culture: NO GROWTH

## 2012-01-05 MED ORDER — FENTANYL 50 MCG/HR TD PT72: 50.0000 ug | MEDICATED_PATCH | TRANSDERMAL | Status: DC

## 2012-01-05 MED ORDER — LISINOPRIL 2.5 MG PO TABS: 2.5000 mg | ORAL_TABLET | Freq: Every day | ORAL | Status: DC

## 2012-01-05 MED ORDER — FENTANYL 50 MCG/HR TD PT72: 1.0000 | MEDICATED_PATCH | TRANSDERMAL | Status: AC

## 2012-01-05 MED ORDER — HYDROMORPHONE HCL 4 MG PO TABS: 4.0000 mg | ORAL_TABLET | ORAL | Status: AC | PRN

## 2012-01-05 MED ORDER — HEPARIN SOD (PORK) LOCK FLUSH 100 UNIT/ML IV SOLN
250.0000 [IU] | Freq: Every day | INTRAVENOUS | Status: DC
  Filled 2012-01-05: qty 3

## 2012-01-05 MED ORDER — FUROSEMIDE 40 MG PO TABS: 40.0000 mg | ORAL_TABLET | Freq: Every day | ORAL | Status: DC

## 2012-01-05 MED ORDER — LORAZEPAM 0.5 MG PO TABS: 1.0000 mg | ORAL_TABLET | Freq: Every day | ORAL | Status: DC

## 2012-01-05 MED ORDER — FENTANYL 25 MCG/HR TD PT72: 1.0000 | MEDICATED_PATCH | TRANSDERMAL | Status: DC

## 2012-01-05 MED ORDER — HYDROMORPHONE HCL 2 MG PO TABS
4.0000 mg | ORAL_TABLET | ORAL | Status: DC | PRN
  Administered 2012-01-05: 4 mg via ORAL
  Filled 2012-01-05: qty 2

## 2012-01-05 MED ORDER — ENSURE COMPLETE PO LIQD: 237.0000 mL | Freq: Two times a day (BID) | ORAL | Status: DC

## 2012-01-05 MED ORDER — WARFARIN SODIUM 7.5 MG PO TABS
7.5000 mg | ORAL_TABLET | Freq: Once | ORAL | Status: AC
  Administered 2012-01-05: 7.5 mg via ORAL
  Filled 2012-01-05: qty 1

## 2012-01-05 MED ORDER — HYDROMORPHONE HCL 4 MG PO TABS: 4.0000 mg | ORAL_TABLET | ORAL | Status: DC | PRN

## 2012-01-05 MED ORDER — HEPARIN SOD (PORK) LOCK FLUSH 100 UNIT/ML IV SOLN
250.0000 [IU] | INTRAVENOUS | Status: DC | PRN
  Administered 2012-01-05: 500 [IU]
  Filled 2012-01-05: qty 3

## 2012-01-05 MED ORDER — RIFAMPIN 300 MG PO CAPS: 600.0000 mg | ORAL_CAPSULE | Freq: Every day | ORAL | Status: DC

## 2012-01-05 MED ORDER — TEMAZEPAM 15 MG PO CAPS: 15.0000 mg | ORAL_CAPSULE | Freq: Every day | ORAL | Status: DC

## 2012-01-05 MED ORDER — RIFAMPIN 300 MG PO CAPS: 600.0000 mg | ORAL_CAPSULE | Freq: Every day | ORAL | Status: AC

## 2012-01-05 MED ORDER — FLEET ENEMA 7-19 GM/118ML RE ENEM: 1.0000 | ENEMA | Freq: Every day | RECTAL | Status: DC | PRN

## 2012-01-05 MED ORDER — FENTANYL 25 MCG/HR TD PT72
25.0000 ug | MEDICATED_PATCH | Freq: Once | TRANSDERMAL | Status: DC
  Administered 2012-01-05: 25 ug via TRANSDERMAL
  Filled 2012-01-05: qty 1

## 2012-01-05 MED ORDER — VANCOMYCIN HCL IN DEXTROSE 1-5 GM/200ML-% IV SOLN: 1000.0000 mg | INTRAVENOUS | Status: DC

## 2012-01-05 NOTE — Discharge Summary (Signed)
Physician Discharge Summary  [name]:[id number] DOB: [birth year] DOA: 12/24/2011  PCP: Allean Found, MD  Admit date: 12/24/2011 Discharge date: 01/05/2012  Discharge Diagnoses:   Staphylococcus aureus bacteremia  HYPOTHYROIDISM  Acute on Chronic Systolic HF exacerbation  DEMENTIA  CAD  Atrial fibrillation  Spinal stenosis  Chronic pain  Fever  Weakness generalized  UTI (lower urinary tract infection)  Abdominal pain  Pacemaker  Pulmonary edema  Decubitus stage 2 ulcer.    Discharge Condition: Stable.   Disposition: Transfer to SNF. Needs 6 weeks IV vancomycin stating counting 7-17. Needs B-met to follow renal function, INR , Vancomycin level. Patient will be started on Lisinopril, check renal function. Monitor BP.   Diet: Heart healthy.    History of present illness:  [name] is an 78 y.o. male who was seen at the the [facility] ED due to complaints of fevers to 101 at home and generalized weakness over the past 24 hours. He denies any cough, chest pain or SOB, nausea or vomiting or diarrhea or ABD pain. He was evaluated in the ED and found to have fever and hypotension, and his laboratory studies revealed a positive UA, and a low sodium level of 130. He was transferred to [facility] for medical admission.    Hospital Course:  78 year old male with multiple medical problems including CHF, status post pacemaker, hypothyroidism, anemia, atrial fibrillation, who was diagnosed with MRSA UTI 1 month ago and was treated for it, patient improved transiently and was admitted with profound weakness and suprapubic tenderness, but the urine culture growing MRSA, he also had blood cultures which grew gram-positive cocci in clusters. Which was confirmed as MRSA. Patient was seen by infectious disease, transthoracic echo was ordered which did not show any vegetations. In the meantime patient became short of breath with altered mental status, and had to transfer to step down. The  exit of the chest showed pulmonary edema requiring IV Lasix. Patient improved with diuresis and was transferred back to floor. He also required pyridium for bladder spasms, Foley catheter was removed and a condom cath was placed. Palliative care consultation was obtained, and patient is a full DO NOT RESUSCITATE at this time. Patient has chronic pain and his pain medications were adjusted by palliative care. A cardiology consultation was obtained, and they did not recommend transesophageal echo to look for vegetations due to multiple medical problems. Patient will require IV vancomycin and rifampin for a minimum of 6 weeks. Patient agrees to go to skilled nursing facility, and social work is helping to find SNF, he has a PICC line in place and will be transferred once bed becomes available.  1- Chronic back pain  Duragesic patch 25 mcg q 72 hr. Palliative care following for pain management Patient was started on dilaudid PRN for pain.   2-Acute Pulmonary edema  In setting systolic HF, patient was receiving IV fluids. Resolved with IV lasix.   3-CHF (systolic dysfunction)  EF 25% . Patient had episode of acute on Chronic Systolic CHF exacerbation. He received IV lasix. He is now Euvolemic. He was transition to oral lasix. I started low dose ACE. Please follow renal function and BP. Hold if SBP less that 100.  Continue on po lasix 40 mg daily   4-Weakness generalized, failure to thrive:  probably related to infection. He will need PT at rehab.   5-MRSA bacteremia  MRSA Bacteremia. Last blood cultures have been negative, drawn 7-17. 2 D echo did not show vegetations. Cardiology  recommends no TEE due to increased risks. picc line in for 6-8 weeks of antibiotics as per ID recommendations. ID recommend 6 weeks antibiotics. Patient will need to follow up with [facility] in 4 weeks. Started to count IV antibiotics since 7-17 after blood culture negative. Need total 6 weeks. Patient was also started on rifampin  7-17, need also 6 week.   6-Sepsis with UTI  Urine culture is positive for MRSA  On vancomycin   7-HYPOTHYROIDISM:  Continue with synthroid.  8-Orange colored urine  He is on rifampin and pyridium , both of which change the urine color.   8-Atrial fibrillation:  Continue with coumadin per pharmacy.  9-Hyponatermia:  Sodium is 125 , improved to 129. Asymptomatic. Monitor.   10-DEMENTIA :  continue with temazepam, aricept, namenda. ativan, per wife patient has been on both ativan and temazepam without problems with sedation.  11-Skin lower back buttock ulcer stage 2:  Local wound care.  12-Leukocytosis  Improving, secondary to infection.  13-Suprapubic pain:  Resolved. Continue pyridium. Condom cath in place. Bladder wall thickness by ct abdomen. Needs to follow up with urologist.       Discharge Exam: Filed Vitals:   01/05/12 0502  BP: 101/51  Pulse: 77  Temp: 98.1 F (36.7 C)  Resp: 18   Filed Vitals:   01/04/12 1430 01/04/12 1800 01/04/12 2048 01/05/12 0502  BP: 134/81 147/77 108/61 101/51  Pulse: 85 76 79 77  Temp: 98 F (36.7 C) 98.4 F (36.9 C) 98.5 F (36.9 C) 98.1 F (36.7 C)  TempSrc: Oral Oral Oral Oral  Resp: 18 20 20 18   Height:   5\' 6"  (1.676 m)   Weight:   50.7 kg (111 lb 12.4 oz)   SpO2: 97% 95% 94% 96%   General: No distress.  Cardiovascular: S1, S2 RRR. Respiratory: CTA.  Discharge Instructions  Discharge Orders    Future Orders Please Complete By Expires   Diet - low sodium heart healthy      Increase activity slowly        Medication List  As of 01/05/2012  1:04 PM   STOP taking these medications         HYDROcodone-acetaminophen 10-500 MG per tablet      spironolactone 25 MG tablet      warfarin 1 MG tablet         TAKE these medications         CALCIUM 600/VITAMIN D PO   Take 1 tablet by mouth 2 (two) times daily.      cyclobenzaprine 10 MG tablet   Commonly known as: FLEXERIL   Take 10 mg by mouth at bedtime.       donepezil 5 MG tablet   Commonly known as: ARICEPT   Take 5 mg by mouth at bedtime.      feeding supplement Liqd   Take 237 mLs by mouth 2 (two) times daily between meals.      fentaNYL 25 MCG/HR   Commonly known as: DURAGESIC - dosed mcg/hr   Place 1 patch (25 mcg total) onto the skin every 3 (three) days.      furosemide 40 MG tablet   Commonly known as: LASIX   Take 1 tablet (40 mg total) by mouth daily.      HYDROmorphone 4 MG tablet   Commonly known as: DILAUDID   Take 1 tablet (4 mg total) by mouth every 4 (four) hours as needed.      levothyroxine 88 MCG  tablet   Commonly known as: SYNTHROID, LEVOTHROID   Take 88 mcg by mouth every morning.      lisinopril 2.5 MG tablet   Commonly known as: PRINIVIL,ZESTRIL   Take 1 tablet (2.5 mg total) by mouth daily.      LORazepam 0.5 MG tablet   Commonly known as: ATIVAN   Take 2 tablets (1 mg total) by mouth at bedtime. For anxiety      memantine 10 MG tablet   Commonly known as: NAMENDA   Take 10 mg by mouth 2 (two) times daily.      MYRBETRIQ 25 MG Tb24   Generic drug: mirabegron ER   Take 25 mg by mouth daily.      omega-3 acid ethyl esters 1 G capsule   Commonly known as: LOVAZA   Take 1 g by mouth 2 (two) times daily.      rifampin 300 MG capsule   Commonly known as: RIFADIN   Take 2 capsules (600 mg total) by mouth daily.      silver sulfADIAZINE 1 % cream   Commonly known as: SILVADENE   Apply 1 application topically daily. Applies to rear      sodium phosphate 7-19 GM/118ML Enem   Place 1 enema rectally daily as needed.      SYSTANE 0.4-0.3 % Soln   Generic drug: Polyethyl Glycol-Propyl Glycol   Apply to eye.      temazepam 15 MG capsule   Commonly known as: RESTORIL   Take 1 capsule (15 mg total) by mouth at bedtime.      vancomycin 1 GM/200ML Soln   Commonly known as: VANCOCIN   Inject 200 mLs (1,000 mg total) into the vein daily.      vitamin C 500 MG tablet   Commonly known as: ASCORBIC ACID     Take 500 mg by mouth daily.      warfarin 5 MG tablet   Commonly known as: COUMADIN   Take 5 mg by mouth daily. Takes 1 tablet daily on mondays and wednesdays              The results of significant diagnostics from this hospitalization (including imaging, microbiology, ancillary and laboratory) are listed below for reference.    Significant Diagnostic Studies: Dg Chest 2 View  12/24/2011  *RADIOLOGY REPORT*  Clinical Data: Fever and weakness.  CHEST - 2 VIEW  Comparison: CT chest 07/30/2011 and chest radiograph 07/29/2011.  Findings: Trachea is midline.  Heart size stable.  Biapical pleural parenchymal scarring.  Probable mild bibasilar scarring.  Lungs are otherwise clear.  No pleural fluid.  Lower thoracic compression fracture is unchanged.  IMPRESSION: Bibasilar scarring.  No acute findings.  Original Report Authenticated By: Reyes Ivan, M.D.   Dg Abd 1 View  12/26/2011  *RADIOLOGY REPORT*  Clinical Data: Lower abdominal pain.  ABDOMEN - 1 VIEW  Comparison: 03/06/2010  Findings: Both small bowel and colonic gas is seen with stool noted in the rectum.  No evidence of dilated bowel loops.  No evidence of free air.  No radiopaque calculi identified.  Old L2 vertebral body compression fracture again noted as well as lumbar degenerative changes and levoscoliosis.  IMPRESSION: Nonspecific, nonobstructive bowel gas pattern.  Original Report Authenticated By: Danae Orleans, M.D.   Ct Abdomen Pelvis W Contrast  12/28/2011  *RADIOLOGY REPORT*  Clinical Data: Lower abdominal pain.  CT ABDOMEN AND PELVIS WITH CONTRAST  Technique:  Multidetector CT imaging of the abdomen and  pelvis was performed following the standard protocol during bolus administration of intravenous contrast.  Contrast: OMNIPAQUE IOHEXOL 300 MG/ML  SOLN  Comparison: 03/04/2010  Findings: Visualized lung bases show small bilateral pleural effusions and atelectasis versus infiltrate at both posterior lung bases, right  greater than left.  The liver shows evidence of periportal edema.  There may be early cirrhotic changes based on appearance of the liver by CT.  Contrast is seen in the upper IVC and hepatic veins consistent with some degree of right heart failure.  No hepatic masses are identified.  The spleen is not enlarged.  There is a calcified granuloma near the splenic hilum.  The kidneys are unremarkable.  The pancreas is atrophic.  Scattered small amount of ascites is seen throughout the peritoneal cavity. No evidence of bowel obstruction or perforation. No enlarged lymph nodes are identified.  Atherosclerotic disease of the abdominal aorta without evidence of aneurysm.  The bladder is decompressed by Foley catheter.  There is significant thickening of the bladder wall circumferentially which appears slightly irregular in contour and approaches 2 cm in estimated thickness.  This may relate to chronic outlet obstruction.  Other bladder pathology cannot be excluded by CT. The  Bones are osteopenic.  There is methylmethacrylate in a compressed L2 vertebral body related to prior kyphoplasty.  IMPRESSION:  1.  Periportal edema in the liver and possible early cirrhotic changes of the liver. 2.  Evidence of right heart failure with reflux of contrast into the IVC and hepatic veins. 3.  Scattered small amount of ascites throughout the peritoneal cavity without evidence of mass lesions or enlarged lymph nodes. 4.  Significant irregular wall thickening of the bladder.  This may relate to chronic outlet obstruction.  Other bladder pathology such as tumor or cystitis cannot be excluded.  Original Report Authenticated By: Reola Calkins, M.D.   Dg Chest Port 1 View  12/31/2011  *RADIOLOGY REPORT*  Clinical Data: Confirm line placement  PORTABLE CHEST - 1 VIEW  Comparison: 12/30/2011  Findings: Right arm PICC with its tip in the lower SVC.  Patchy bilateral lower lobe opacities, likely reflecting a combination of atelectasis and  layering pleural effusions, right lower lobe pneumonia not excluded.  No pneumothorax.  The heart is top normal in size.  Left subclavian pacemaker.  IMPRESSION: Right arm PICC with its tip in the lower SVC.  Patchy bilateral lower lobe opacities, likely reflecting a combination of atelectasis and layering pleural effusions, right lower lobe pneumonia not excluded.  Original Report Authenticated By: Charline Bills, M.D.   Dg Chest Port 1 View  12/30/2011  *RADIOLOGY REPORT*  Clinical Data: Rapid breathing.  PORTABLE CHEST - 1 VIEW  Comparison: Chest 12/24/2011.  Findings: Pacing device is again identified.  There has been marked increase in bilateral airspace disease in the mid and lower lung zones, much worse on the right.  There is likely a right pleural effusion.  Cardiomegaly is noted.  IMPRESSION: Marked worsening of right worse than left airspace disease and right pleural effusion.  Finding could be due to aspiration, edema or pneumonia.  These results will be called to the ordering clinician or representative by the Radiologist Assistant, and communication documented in the PACS Dashboard.  Original Report Authenticated By: Bernadene Bell. Maricela Curet, M.D.    Microbiology: Recent Results (from the past 240 hour(s))  CULTURE, BLOOD (ROUTINE X 2)     Status: Normal   Collection Time   12/27/11  9:10 AM  Component Value Range Status Comment   Specimen Description BLOOD ARM RIGHT   Final    Special Requests BOTTLES DRAWN AEROBIC AND ANAEROBIC 10CC   Final    Culture  Setup Time 12/27/2011 15:49   Final    Culture     Final    Value: STAPHYLOCOCCUS AUREUS     Note: SUSCEPTIBILITIES PERFORMED ON PREVIOUS CULTURE WITHIN THE LAST 5 DAYS.     Note: Gram Stain Report Called to,Read Back By and Verified With: Kennyth Arnold @ 1253 12/28/11 BY KRAWS   Report Status 12/30/2011 FINAL   Final   CULTURE, BLOOD (ROUTINE X 2)     Status: Normal   Collection Time   12/27/11  9:30 AM      Component Value Range  Status Comment   Specimen Description BLOOD HAND RIGHT   Final    Special Requests BOTTLES DRAWN AEROBIC AND ANAEROBIC 10CC   Final    Culture  Setup Time 12/27/2011 15:48   Final    Culture     Final    Value: METHICILLIN RESISTANT STAPHYLOCOCCUS AUREUS     Note: RIFAMPIN AND GENTAMICIN SHOULD NOT BE USED AS SINGLE DRUGS FOR TREATMENT OF STAPH INFECTIONS. CRITICAL RESULT CALLED TO, READ BACK BY AND VERIFIED WITH: MICHELLE M. @1400  12/29/11 BY KRAWS This organism DOES NOT demonstrate inducible Clindamycin      resistance in vitro.     Note: Gram Stain Report Called to,Read Back By and Verified With: SIERRA JOHNSON @ 1253 12/28/11 BY KRAWS   Report Status 12/30/2011 FINAL   Final    Organism ID, Bacteria METHICILLIN RESISTANT STAPHYLOCOCCUS AUREUS   Final   MRSA PCR SCREENING     Status: Abnormal   Collection Time   12/27/11 10:17 AM      Component Value Range Status Comment   MRSA by PCR POSITIVE (*) NEGATIVE Final   CULTURE, BLOOD (ROUTINE X 2)     Status: Normal   Collection Time   12/29/11 11:26 PM      Component Value Range Status Comment   Specimen Description BLOOD RIGHT ARM   Final    Special Requests BOTTLES DRAWN AEROBIC AND ANAEROBIC 10CC   Final    Culture  Setup Time 12/30/2011 04:13   Final    Culture NO GROWTH 5 DAYS   Final    Report Status 01/05/2012 FINAL   Final   CULTURE, BLOOD (ROUTINE X 2)     Status: Normal   Collection Time   12/29/11 11:27 PM      Component Value Range Status Comment   Specimen Description BLOOD RIGHT HAND   Final    Special Requests BOTTLES DRAWN AEROBIC AND ANAEROBIC 10CC   Final    Culture  Setup Time 12/30/2011 04:13   Final    Culture NO GROWTH 5 DAYS   Final    Report Status 01/05/2012 FINAL   Final      Labs: Basic Metabolic Panel:  Lab 01/05/12 8295 01/02/12 0620 01/01/12 0500 12/31/11 1034 12/30/11 0917  NA 129* 125* 129* 129* 130*  K 3.6 4.0 -- -- --  CL 94* 91* 96 95* 99  CO2 25 22 24 24 23   GLUCOSE 81 83 90 122* 119*  BUN 17  16 16 16 19   CREATININE 0.67 0.64 0.60 0.47* 0.48*  CALCIUM 8.6 8.1* 7.5* 7.7* 8.0*  MG -- -- -- -- --  PHOS -- -- -- -- --   Liver Function Tests:  Lab  01/01/12 0500 12/31/11 1034 12/30/11 0917  AST 23 25 28   ALT 19 23 30   ALKPHOS 125* 132* 132*  BILITOT 1.2 1.2 1.7*  PROT 5.3* 5.5* 5.6*  ALBUMIN 2.3* 2.4* 2.5*  CBC:  Lab 01/05/12 0611 01/02/12 0620 01/01/12 0500 12/31/11 1034 12/30/11 0917  WBC 8.6 13.0* 13.6* 15.6* 16.8*  NEUTROABS -- -- -- -- --  HGB 10.0* 11.0* 10.3* 11.3* 11.2*  HCT 28.5* 31.3* 28.8* 31.9* 31.1*  MCV 90.8 88.9 88.1 89.9 88.6  PLT 334 312 248 204 153    Time coordinating discharge: 35 minutes  Signed:  Jamaal Bernasconi  [facility] 01/05/2012, 1:04 PM

## 2012-01-05 NOTE — Progress Notes (Signed)
Pt refuses to be turned q2h by nursing staff. Pt's all in one bed does turn him slightly. Will educate pt on need to turn and continue to monitor for skin changes.

## 2012-01-05 NOTE — Progress Notes (Signed)
ANTICOAGULATION and ANTIBIOTIC CONSULT NOTE - Follow Up Consult  Pharmacy Consult for Coumadin and vancomycin Indication: atrial fibrillation; MRSA bacteremia  Allergies  Allergen Reactions  . Penicillins Hives  . Uroxatral (Alfuzosin Hydrochloride) Other (See Comments)    hypotension    Patient Measurements: Height: 5\' 6"  (167.6 cm) Weight: 111 lb 12.4 oz (50.7 kg) IBW/kg (Calculated) : 63.8    Vital Signs: Temp: 98.1 F (36.7 C) (07/24 0502) Temp src: Oral (07/24 0502) BP: 101/51 mmHg (07/24 0502) Pulse Rate: 77  (07/24 0502)  Labs:  Alvira Philips 01/05/12 1308 01/04/12 0547 01/03/12 0633  HGB 10.0* -- --  HCT 28.5* -- --  PLT 334 -- --  APTT -- -- --  LABPROT 18.7* 17.4* 18.4*  INR 1.53* 1.40 1.50*  HEPARINUNFRC -- -- --  CREATININE 0.67 -- --  CKTOTAL -- -- --  CKMB -- -- --  TROPONINI -- -- --    Estimated Creatinine Clearance: 46.7 ml/min (by C-G formula based on Cr of 0.67).   Assessment: Patient is an 78 y.o. F on Coumadin for Afib.  INR is subtherapeutic but is trending up.  Currently on vancomycin day #10 and rifampin day# 7 of 6 weeks minimum for MRSA bacteremia.  Echo negative for vegetation.  Last vancomycin level drawn on 7/21 was supratherapeutic at 28.2 with dose changed from 1gm q12h to 1gm q24h.  Patient is afebrile and wbc wnl.   Goal of Therapy:  INR 2-3; vancomycin trough 15-20    Plan:  1) Repeat coumadin 7.5mg  PO x1 2) no change for vanc 3) check level on 7/25  Rylan Bernard P 01/05/2012,8:51 AM

## 2012-01-05 NOTE — Progress Notes (Signed)
Called report to nurse at [facility]. He will be going to room [address].

## 2012-01-05 NOTE — Progress Notes (Signed)
Patient ID: [name], male   DOB: 03/05/1923, 78 y.o.   MRN: [id number]    [facility]    Date of Admission:  12/24/2011   Total days of antibiotics 9         Principal Problem:  *Staphylococcus aureus bacteremia Active Problems:  UTI (lower urinary tract infection)  HYPOTHYROIDISM  DEMENTIA  CAD  Atrial fibrillation  Spinal stenosis  Chronic pain  Fever  Weakness generalized  Abdominal pain  Pacemaker  Pulmonary edema      . donepezil  5 mg Oral QHS  . feeding supplement  237 mL Oral BID BM  . fentaNYL  50 mcg Transdermal Q72H  . fentaNYL  25 mcg Transdermal Once  . furosemide  40 mg Oral Daily  . heparin lock flush  250 Units Intracatheter Daily  . levothyroxine  88 mcg Oral QAC breakfast  . lisinopril  2.5 mg Oral Daily  . LORazepam  1 mg Oral QHS  . memantine  10 mg Oral BID  . mirabegron ER  25 mg Oral Daily  . omega-3 acid ethyl esters  1 g Oral BID  . polyethylene glycol  17 g Oral Daily  . rifampin  600 mg Oral Daily  . silver sulfADIAZINE  1 application Topical Daily  . sodium chloride  10-40 mL Intracatheter Q12H  . sodium chloride  3 mL Intravenous Q12H  . Tamsulosin HCl  0.4 mg Oral QPC supper  . temazepam  15 mg Oral QHS  . vancomycin  1,000 mg Intravenous Q24H  . vitamin C  500 mg Oral Daily  . warfarin  7.5 mg Oral ONCE-1800  . warfarin  7.5 mg Oral ONCE-1800  . Warfarin - Pharmacist Dosing Inpatient   Does not apply q1800  . DISCONTD: fentaNYL  50 mcg Transdermal Q72H  . DISCONTD: fentaNYL  25 mcg Transdermal Q72H    Subjective: He is weak but feeling a little bit better  Objective: Temp:  [97.6 F (36.4 C)-98.5 F (36.9 C)] 97.9 F (36.6 C) (07/24 1315) Pulse Rate:  [76-80] 80  (07/24 1315) Resp:  [18-20] 18  (07/24 1315) BP: (96-147)/(51-77) 96/61 mmHg (07/24 1315) SpO2:  [94 %-96 %] 94 % (07/24 1315) Weight:  [50.7 kg (111 lb 12.4 oz)] 50.7 kg (111 lb 12.4 oz) (07/23 2048)  General: He is asleep but  arouses easily Skin: Scattered ecchymoses Lungs: Clear Cor: Regular S1 and S2 no murmurs. His pacemaker site appears normal  Lab Results Lab Results  Component Value Date   WBC 8.6 01/05/2012   HGB 10.0* 01/05/2012   HCT 28.5* 01/05/2012   MCV 90.8 01/05/2012   PLT 334 01/05/2012    Lab Results  Component Value Date   CREATININE 0.67 01/05/2012   BUN 17 01/05/2012   NA 129* 01/05/2012   K 3.6 01/05/2012   CL 94* 01/05/2012   CO2 25 01/05/2012    Lab Results  Component Value Date   ALT 19 01/01/2012   AST 23 01/01/2012   ALKPHOS 125* 01/01/2012   BILITOT 1.2 01/01/2012      Microbiology: Recent Results (from the past 240 hour(s))  CULTURE, BLOOD (ROUTINE X 2)     Status: Normal   Collection Time   12/27/11  9:10 AM      Component Value Range Status Comment   Specimen Description BLOOD ARM RIGHT   Final    Special Requests BOTTLES DRAWN AEROBIC AND ANAEROBIC 10CC   Final  Culture  Setup Time 12/27/2011 15:49   Final    Culture     Final    Value: STAPHYLOCOCCUS AUREUS     Note: SUSCEPTIBILITIES PERFORMED ON PREVIOUS CULTURE WITHIN THE LAST 5 DAYS.     Note: Gram Stain Report Called to,Read Back By and Verified With: Kennyth Arnold @ 1253 12/28/11 BY KRAWS   Report Status 12/30/2011 FINAL   Final   CULTURE, BLOOD (ROUTINE X 2)     Status: Normal   Collection Time   12/27/11  9:30 AM      Component Value Range Status Comment   Specimen Description BLOOD HAND RIGHT   Final    Special Requests BOTTLES DRAWN AEROBIC AND ANAEROBIC 10CC   Final    Culture  Setup Time 12/27/2011 15:48   Final    Culture     Final    Value: METHICILLIN RESISTANT STAPHYLOCOCCUS AUREUS     Note: RIFAMPIN AND GENTAMICIN SHOULD NOT BE USED AS SINGLE DRUGS FOR TREATMENT OF STAPH INFECTIONS. CRITICAL RESULT CALLED TO, READ BACK BY AND VERIFIED WITH: MICHELLE M. @1400  12/29/11 BY KRAWS This organism DOES NOT demonstrate inducible Clindamycin      resistance in vitro.     Note: Gram Stain Report Called to,Read  Back By and Verified With: Kennyth Arnold @ 1253 12/28/11 BY KRAWS   Report Status 12/30/2011 FINAL   Final    Organism ID, Bacteria METHICILLIN RESISTANT STAPHYLOCOCCUS AUREUS   Final   MRSA PCR SCREENING     Status: Abnormal   Collection Time   12/27/11 10:17 AM      Component Value Range Status Comment   MRSA by PCR POSITIVE (*) NEGATIVE Final   CULTURE, BLOOD (ROUTINE X 2)     Status: Normal   Collection Time   12/29/11 11:26 PM      Component Value Range Status Comment   Specimen Description BLOOD RIGHT ARM   Final    Special Requests BOTTLES DRAWN AEROBIC AND ANAEROBIC 10CC   Final    Culture  Setup Time 12/30/2011 04:13   Final    Culture NO GROWTH 5 DAYS   Final    Report Status 01/05/2012 FINAL   Final   CULTURE, BLOOD (ROUTINE X 2)     Status: Normal   Collection Time   12/29/11 11:27 PM      Component Value Range Status Comment   Specimen Description BLOOD RIGHT HAND   Final    Special Requests BOTTLES DRAWN AEROBIC AND ANAEROBIC 10CC   Final    Culture  Setup Time 12/30/2011 04:13   Final    Culture NO GROWTH 5 DAYS   Final    Report Status 01/05/2012 FINAL   Final     Studies/Results: No results found.  Assessment: He is better but only time will tell if we will be able to cure her his MRSA bacteremia.  Plan: 1. Continue vancomycin and rifampin through August 25 2. I will arrange followup in our clinic  Cliffton Asters, MD [facility] [phone] pager   [phone] cell 01/05/2012, 5:24 PM

## 2012-01-05 NOTE — Progress Notes (Addendum)
Progress Note from the Palliative Medicine Team at [facility]  Subjective: Pain remains a significant issue, some improvement with PO dilaudid. Generalized weakness -No other complaints  Objective: Allergies  Allergen Reactions  . Penicillins Hives  . Uroxatral (Alfuzosin Hydrochloride) Other (See Comments)    hypotension   Scheduled Meds:    . donepezil  5 mg Oral QHS  . feeding supplement  237 mL Oral BID BM  . fentaNYL  50 mcg Transdermal Q72H  . fentaNYL  25 mcg Transdermal Once  . furosemide  40 mg Oral Daily  . levothyroxine  88 mcg Oral QAC breakfast  . lisinopril  2.5 mg Oral Daily  . LORazepam  1 mg Oral QHS  . memantine  10 mg Oral BID  . mirabegron ER  25 mg Oral Daily  . omega-3 acid ethyl esters  1 g Oral BID  . polyethylene glycol  17 g Oral Daily  . rifampin  600 mg Oral Daily  . silver sulfADIAZINE  1 application Topical Daily  . sodium chloride  10-40 mL Intracatheter Q12H  . sodium chloride  3 mL Intravenous Q12H  . Tamsulosin HCl  0.4 mg Oral QPC supper  . temazepam  15 mg Oral QHS  . vancomycin  1,000 mg Intravenous Q24H  . vitamin C  500 mg Oral Daily  . warfarin  7.5 mg Oral ONCE-1800  . warfarin  7.5 mg Oral ONCE-1800  . Warfarin - Pharmacist Dosing Inpatient   Does not apply q1800  . DISCONTD: fentaNYL  50 mcg Transdermal Q72H  . DISCONTD: fentaNYL  25 mcg Transdermal Q72H   Continuous Infusions:    . sodium chloride 20 mL/hr (12/31/11 2332)   PRN Meds:.acetaminophen, acetaminophen, alum & mag hydroxide-simeth, HYDROmorphone (DILAUDID) injection, HYDROmorphone, ondansetron (ZOFRAN) IV, ondansetron, sodium chloride, sodium phosphate, DISCONTD: HYDROmorphone, DISCONTD: oxyCODONE  BP 96/61  Pulse 80  Temp 97.9 F (36.6 C) (Oral)  Resp 18  Ht 5\' 6"  (1.676 m)  Wt 50.7 kg (111 lb 12.4 oz)  BMI 18.04 kg/m2  SpO2 94%   Pain Score: 7/10  Pain Location back, legs   Intake/Output Summary (Last 24 hours) at 01/05/12 1538 Last data filed at  01/05/12 1320  Gross per 24 hour  Intake    480 ml  Output   1850 ml  Net  -1370 ml      ZOX:[id number]   Stool Softner:yes  Physical Exam: Alert, talkative, pain better after given 1 dose of .5 Dilaudid. Move all extremities, sitting in chair Decreased breath sounds, fine crackles.  Labs: CBC    Component Value Date/Time   WBC 8.6 01/05/2012 0611   WBC 5.1 04/10/2008 1526   RBC 3.14* 01/05/2012 0611   RBC 4.00* 04/10/2008 1526   HGB 10.0* 01/05/2012 0611   HGB 13.6 04/10/2008 1526   HCT 28.5* 01/05/2012 0611   HCT 39.0 04/10/2008 1526   PLT 334 01/05/2012 0611   PLT 224 04/10/2008 1526   MCV 90.8 01/05/2012 0611   MCV 97.5 04/10/2008 1526   MCH 31.8 01/05/2012 0611   MCH 34.1* 04/10/2008 1526   MCHC 35.1 01/05/2012 0611   MCHC 34.9 04/10/2008 1526   RDW 15.6* 01/05/2012 0611   RDW 13.7 04/10/2008 1526   LYMPHSABS 0.7 12/24/2011 1708   LYMPHSABS 1.2 04/10/2008 1526   MONOABS 0.9 12/24/2011 1708   MONOABS 0.7 04/10/2008 1526   EOSABS 0.0 12/24/2011 1708   EOSABS 0.1 04/10/2008 1526   BASOSABS 0.0 12/24/2011 1708   BASOSABS 0.0  04/10/2008 1526    BMET    Component Value Date/Time   NA 129* 01/05/2012 0611   K 3.6 01/05/2012 0611   CL 94* 01/05/2012 0611   CO2 25 01/05/2012 0611   GLUCOSE 81 01/05/2012 0611   BUN 17 01/05/2012 0611   CREATININE 0.67 01/05/2012 0611   CALCIUM 8.6 01/05/2012 0611   GFRNONAA 84* 01/05/2012 0611   GFRAA >90 01/05/2012 0611    CMP     Component Value Date/Time   NA 129* 01/05/2012 0611   K 3.6 01/05/2012 0611   CL 94* 01/05/2012 0611   CO2 25 01/05/2012 0611   GLUCOSE 81 01/05/2012 0611   BUN 17 01/05/2012 0611   CREATININE 0.67 01/05/2012 0611   CALCIUM 8.6 01/05/2012 0611   PROT 5.3* 01/01/2012 0500   ALBUMIN 2.3* 01/01/2012 0500   AST 23 01/01/2012 0500   ALT 19 01/01/2012 0500   ALKPHOS 125* 01/01/2012 0500   BILITOT 1.2 01/01/2012 0500   GFRNONAA 84* 01/05/2012 0611   GFRAA >90 01/05/2012 0611  1  Assessment and Plan: 1. Code  Status: 1. DNR 2. Symptom Control: 1. Chronic Pain: Added oral Dilaudid q2 hours. INCREASED FENTANYL PATCH TO 80mcg-APPLY PRIOR TO D/C 3. Psycho/Social: 1. Good family support, daughter is physician, goals clear. 4. Spiritual: 1. No current needs expressed 5. Disposition: 1. Going to [facility] today  Patient Documents Completed or Given: Document Given Completed  Advanced Directives Pkt    MOST    DNR    Gone from My Sight    Hard Choices      Time In Time Out Total Time Spent with Patient Total Overall Time  3PM 320PM     Greater than 50%  of this time was spent counseling and coordinating care related to the above assessment and plan.     1

## 2012-01-12 DIAGNOSIS — R269 Unspecified abnormalities of gait and mobility: Secondary | ICD-10-CM | POA: Diagnosis not present

## 2012-01-12 DIAGNOSIS — M545 Low back pain: Secondary | ICD-10-CM | POA: Diagnosis not present

## 2012-01-13 DIAGNOSIS — I4891 Unspecified atrial fibrillation: Secondary | ICD-10-CM | POA: Diagnosis not present

## 2012-01-13 DIAGNOSIS — M159 Polyosteoarthritis, unspecified: Secondary | ICD-10-CM | POA: Diagnosis not present

## 2012-01-13 DIAGNOSIS — A419 Sepsis, unspecified organism: Secondary | ICD-10-CM | POA: Diagnosis not present

## 2012-01-18 DIAGNOSIS — M545 Low back pain: Secondary | ICD-10-CM | POA: Diagnosis not present

## 2012-01-21 DIAGNOSIS — R269 Unspecified abnormalities of gait and mobility: Secondary | ICD-10-CM | POA: Diagnosis not present

## 2012-01-21 DIAGNOSIS — M545 Low back pain: Secondary | ICD-10-CM | POA: Diagnosis not present

## 2012-01-27 DIAGNOSIS — G309 Alzheimer's disease, unspecified: Secondary | ICD-10-CM | POA: Diagnosis not present

## 2012-01-27 DIAGNOSIS — A419 Sepsis, unspecified organism: Secondary | ICD-10-CM | POA: Diagnosis not present

## 2012-01-27 DIAGNOSIS — G8929 Other chronic pain: Secondary | ICD-10-CM | POA: Diagnosis not present

## 2012-02-01 ENCOUNTER — Ambulatory Visit (INDEPENDENT_AMBULATORY_CARE_PROVIDER_SITE_OTHER): Payer: Medicare Other | Admitting: Internal Medicine

## 2012-02-01 ENCOUNTER — Encounter: Payer: Self-pay | Admitting: Internal Medicine

## 2012-02-01 VITALS — BP 100/67 | HR 81 | Ht 65.0 in | Wt 108.0 lb

## 2012-02-01 DIAGNOSIS — R7881 Bacteremia: Secondary | ICD-10-CM

## 2012-02-01 DIAGNOSIS — A4901 Methicillin susceptible Staphylococcus aureus infection, unspecified site: Secondary | ICD-10-CM

## 2012-02-01 NOTE — Progress Notes (Signed)
Patient ID: [name], male   DOB: 1923-05-27, 78 y.o.   MRN: [id number]    [facility]  Patient Active Problem List  Diagnosis  . HYPOTHYROIDISM  . DEMENTIA  . CAD  . Atrial fibrillation  . Spinal stenosis  . Chronic pain  . Fever  . Weakness generalized  . UTI (lower urinary tract infection)  . Abdominal pain  . Staphylococcus aureus bacteremia  . Pacemaker  . Pulmonary edema    Patient's Medications  New Prescriptions   No medications on file  Previous Medications   CALCIUM CARBONATE-VITAMIN D (CALCIUM 600/VITAMIN D PO)    Take 1 tablet by mouth 2 (two) times daily.    CYCLOBENZAPRINE (FLEXERIL) 10 MG TABLET    Take 10 mg by mouth at bedtime.   DONEPEZIL (ARICEPT) 5 MG TABLET    Take 5 mg by mouth at bedtime.    FEEDING SUPPLEMENT (ENSURE COMPLETE) LIQD    Take 237 mLs by mouth 2 (two) times daily between meals.   FENTANYL (DURAGESIC - DOSED MCG/HR) 50 MCG/HR    Place 1 patch (50 mcg total) onto the skin every 3 (three) days.   FUROSEMIDE (LASIX) 40 MG TABLET    Take 1 tablet (40 mg total) by mouth daily.   LEVOTHYROXINE (SYNTHROID, LEVOTHROID) 88 MCG TABLET    Take 88 mcg by mouth every morning.   LISINOPRIL (PRINIVIL,ZESTRIL) 2.5 MG TABLET    Take 1 tablet (2.5 mg total) by mouth daily.   LORAZEPAM (ATIVAN) 0.5 MG TABLET    Take 2 tablets (1 mg total) by mouth at bedtime. For anxiety   MEMANTINE (NAMENDA) 10 MG TABLET    Take 10 mg by mouth 2 (two) times daily.   MIRABEGRON ER (MYRBETRIQ) 25 MG TB24    Take 25 mg by mouth daily.   OMEGA-3 ACID ETHYL ESTERS (LOVAZA) 1 G CAPSULE    Take 1 g by mouth 2 (two) times daily.    POLYETHYL GLYCOL-PROPYL GLYCOL (SYSTANE) 0.4-0.3 % SOLN    Apply to eye.   RIFAMPIN (RIFADIN) 300 MG CAPSULE    Take 2 capsules (600 mg total) by mouth daily.   SILVER SULFADIAZINE (SILVADENE) 1 % CREAM    Apply 1 application topically daily. Applies to rear   SODIUM PHOSPHATE (FLEET) 7-19 GM/118ML ENEM    Place 1 enema  rectally daily as needed.   TEMAZEPAM (RESTORIL) 15 MG CAPSULE    Take 1 capsule (15 mg total) by mouth at bedtime.   VANCOMYCIN (VANCOCIN) 1 GM/200ML SOLN    Inject 200 mLs (1,000 mg total) into the vein daily.   VITAMIN C (ASCORBIC ACID) 500 MG TABLET    Take 500 mg by mouth daily.    WARFARIN (COUMADIN) 5 MG TABLET    Take 5 mg by mouth daily. Takes 1 tablet daily on mondays and wednesdays  Modified Medications   No medications on file  Discontinued Medications   No medications on file    Subjective: Mr. Geske is in for his hospital followup visit. He is accompanied by his daughter. He was hospitalized last month with MRSA bacteremia and a urinary tract infection. He has a permanent pacemaker about a transthoracic echocardiogram did not show any evidence of pacemaker endocarditis. He improved with vancomycin and rifampin and repeat blood cultures were negative. We elected to attempt to cure the infection with 6 weeks of antibiotic therapy while retaining the pacemaker. He has now completed 36 days of therapy. He  is feeling better but continues to have a poor appetite and remains weak. He has not had any fever. He has not had any problems tolerating his PICC or antibiotics.  Objective: BP: 100/67 mmHg (08/20 1129) Pulse Rate: 81  (08/20 1129)  General: He is very thin and frail but much more alert than when I last saw him. Skin: He has scattered ecchymoses on his arms. His right arm PICC site appears normal Lungs: Clear Cor: Regular S1 and S2 with no murmur. His left upper chest pacemaker site appears normal.  Assessment: He is improving. He will complete antibiotic therapy on August 28 and can have the PICC removed at that time. I've asked his daughter to call me immediately if he has any recurrent fever or other signs of possible relapse of his infection.  Plan: 1. Continue vancomycin and rifampin through August 28 and then discontinue them and remove PICC 2. Followup here in one  month   Cliffton Asters, MD [facility] for Infectious Disease [facility] [phone] pager   [phone] cell 02/01/2012, 11:46 AM

## 2012-02-03 DIAGNOSIS — M545 Low back pain: Secondary | ICD-10-CM | POA: Diagnosis not present

## 2012-02-03 DIAGNOSIS — R269 Unspecified abnormalities of gait and mobility: Secondary | ICD-10-CM | POA: Diagnosis not present

## 2012-02-07 DIAGNOSIS — R269 Unspecified abnormalities of gait and mobility: Secondary | ICD-10-CM | POA: Diagnosis not present

## 2012-02-07 DIAGNOSIS — M25569 Pain in unspecified knee: Secondary | ICD-10-CM | POA: Diagnosis not present

## 2012-02-08 DIAGNOSIS — R630 Anorexia: Secondary | ICD-10-CM | POA: Diagnosis not present

## 2012-02-08 DIAGNOSIS — F329 Major depressive disorder, single episode, unspecified: Secondary | ICD-10-CM | POA: Diagnosis not present

## 2012-02-15 DIAGNOSIS — R630 Anorexia: Secondary | ICD-10-CM | POA: Diagnosis not present

## 2012-02-15 DIAGNOSIS — G8929 Other chronic pain: Secondary | ICD-10-CM | POA: Diagnosis not present

## 2012-02-15 DIAGNOSIS — R5383 Other fatigue: Secondary | ICD-10-CM | POA: Diagnosis not present

## 2012-02-16 DIAGNOSIS — F329 Major depressive disorder, single episode, unspecified: Secondary | ICD-10-CM | POA: Diagnosis not present

## 2012-02-16 DIAGNOSIS — R630 Anorexia: Secondary | ICD-10-CM | POA: Diagnosis not present

## 2012-02-16 DIAGNOSIS — G8929 Other chronic pain: Secondary | ICD-10-CM | POA: Diagnosis not present

## 2012-02-17 DIAGNOSIS — R0902 Hypoxemia: Secondary | ICD-10-CM | POA: Diagnosis not present

## 2012-02-17 DIAGNOSIS — F329 Major depressive disorder, single episode, unspecified: Secondary | ICD-10-CM | POA: Diagnosis not present

## 2012-02-17 DIAGNOSIS — G003 Staphylococcal meningitis: Secondary | ICD-10-CM | POA: Diagnosis not present

## 2012-02-19 DIAGNOSIS — I509 Heart failure, unspecified: Secondary | ICD-10-CM | POA: Diagnosis not present

## 2012-02-19 DIAGNOSIS — I4891 Unspecified atrial fibrillation: Secondary | ICD-10-CM | POA: Diagnosis not present

## 2012-02-19 DIAGNOSIS — I2510 Atherosclerotic heart disease of native coronary artery without angina pectoris: Secondary | ICD-10-CM | POA: Diagnosis not present

## 2012-02-20 DIAGNOSIS — I509 Heart failure, unspecified: Secondary | ICD-10-CM | POA: Diagnosis not present

## 2012-02-20 DIAGNOSIS — I2510 Atherosclerotic heart disease of native coronary artery without angina pectoris: Secondary | ICD-10-CM | POA: Diagnosis not present

## 2012-02-20 DIAGNOSIS — I4891 Unspecified atrial fibrillation: Secondary | ICD-10-CM | POA: Diagnosis not present

## 2012-02-21 DIAGNOSIS — I4891 Unspecified atrial fibrillation: Secondary | ICD-10-CM | POA: Diagnosis not present

## 2012-02-21 DIAGNOSIS — I2510 Atherosclerotic heart disease of native coronary artery without angina pectoris: Secondary | ICD-10-CM | POA: Diagnosis not present

## 2012-02-21 DIAGNOSIS — I509 Heart failure, unspecified: Secondary | ICD-10-CM | POA: Diagnosis not present

## 2012-02-22 DIAGNOSIS — I4891 Unspecified atrial fibrillation: Secondary | ICD-10-CM | POA: Diagnosis not present

## 2012-02-22 DIAGNOSIS — I2510 Atherosclerotic heart disease of native coronary artery without angina pectoris: Secondary | ICD-10-CM | POA: Diagnosis not present

## 2012-02-22 DIAGNOSIS — I509 Heart failure, unspecified: Secondary | ICD-10-CM | POA: Diagnosis not present

## 2012-02-24 DIAGNOSIS — I2510 Atherosclerotic heart disease of native coronary artery without angina pectoris: Secondary | ICD-10-CM | POA: Diagnosis not present

## 2012-02-24 DIAGNOSIS — I4891 Unspecified atrial fibrillation: Secondary | ICD-10-CM | POA: Diagnosis not present

## 2012-02-24 DIAGNOSIS — I509 Heart failure, unspecified: Secondary | ICD-10-CM | POA: Diagnosis not present

## 2012-02-28 DIAGNOSIS — I2510 Atherosclerotic heart disease of native coronary artery without angina pectoris: Secondary | ICD-10-CM | POA: Diagnosis not present

## 2012-02-28 DIAGNOSIS — I509 Heart failure, unspecified: Secondary | ICD-10-CM | POA: Diagnosis not present

## 2012-02-28 DIAGNOSIS — I4891 Unspecified atrial fibrillation: Secondary | ICD-10-CM | POA: Diagnosis not present

## 2012-03-02 DIAGNOSIS — I4891 Unspecified atrial fibrillation: Secondary | ICD-10-CM | POA: Diagnosis not present

## 2012-03-02 DIAGNOSIS — I2510 Atherosclerotic heart disease of native coronary artery without angina pectoris: Secondary | ICD-10-CM | POA: Diagnosis not present

## 2012-03-02 DIAGNOSIS — I509 Heart failure, unspecified: Secondary | ICD-10-CM | POA: Diagnosis not present

## 2012-03-06 DIAGNOSIS — I509 Heart failure, unspecified: Secondary | ICD-10-CM | POA: Diagnosis not present

## 2012-03-06 DIAGNOSIS — I4891 Unspecified atrial fibrillation: Secondary | ICD-10-CM | POA: Diagnosis not present

## 2012-03-06 DIAGNOSIS — I2510 Atherosclerotic heart disease of native coronary artery without angina pectoris: Secondary | ICD-10-CM | POA: Diagnosis not present

## 2012-03-07 ENCOUNTER — Encounter: Payer: Self-pay | Admitting: Internal Medicine

## 2012-03-07 ENCOUNTER — Ambulatory Visit (INDEPENDENT_AMBULATORY_CARE_PROVIDER_SITE_OTHER): Payer: Medicare Other | Admitting: Internal Medicine

## 2012-03-07 VITALS — BP 101/64 | HR 77 | Temp 97.3°F

## 2012-03-07 DIAGNOSIS — I4891 Unspecified atrial fibrillation: Secondary | ICD-10-CM | POA: Diagnosis not present

## 2012-03-07 DIAGNOSIS — R7881 Bacteremia: Secondary | ICD-10-CM | POA: Diagnosis not present

## 2012-03-07 DIAGNOSIS — A4901 Methicillin susceptible Staphylococcus aureus infection, unspecified site: Secondary | ICD-10-CM

## 2012-03-07 DIAGNOSIS — I2510 Atherosclerotic heart disease of native coronary artery without angina pectoris: Secondary | ICD-10-CM | POA: Diagnosis not present

## 2012-03-07 DIAGNOSIS — I509 Heart failure, unspecified: Secondary | ICD-10-CM | POA: Diagnosis not present

## 2012-03-07 NOTE — Progress Notes (Signed)
Patient ID: [name], male   DOB: Dec 19, 1922, 78 y.o.   MRN: [id number]    [facility]  Patient Active Problem List  Diagnosis  . HYPOTHYROIDISM  . DEMENTIA  . CAD  . Atrial fibrillation  . Spinal stenosis  . Chronic pain  . Fever  . Weakness generalized  . UTI (lower urinary tract infection)  . Abdominal pain  . Staphylococcus aureus bacteremia  . Pacemaker  . Pulmonary edema    Patient's Medications  New Prescriptions   No medications on file  Previous Medications   CALCIUM CARBONATE-VITAMIN D (CALCIUM 600/VITAMIN D PO)    Take 1 tablet by mouth 2 (two) times daily.    CITALOPRAM (CELEXA) 10 MG TABLET    Take 10 mg by mouth at bedtime.   CYCLOBENZAPRINE (FLEXERIL) 10 MG TABLET    Take 10 mg by mouth at bedtime.   DONEPEZIL (ARICEPT) 5 MG TABLET    Take 5 mg by mouth at bedtime.    FEEDING SUPPLEMENT (ENSURE COMPLETE) LIQD    Take 237 mLs by mouth 2 (two) times daily between meals.   FUROSEMIDE (LASIX) 40 MG TABLET    Take 1 tablet (40 mg total) by mouth daily.   LEVOTHYROXINE (SYNTHROID, LEVOTHROID) 88 MCG TABLET    Take 88 mcg by mouth every morning.   LISINOPRIL (PRINIVIL,ZESTRIL) 2.5 MG TABLET    Take 1 tablet (2.5 mg total) by mouth daily.   MEMANTINE (NAMENDA) 10 MG TABLET    Take 10 mg by mouth 2 (two) times daily.   MIRABEGRON ER (MYRBETRIQ) 25 MG TB24    Take 25 mg by mouth daily.   OMEGA-3 ACID ETHYL ESTERS (LOVAZA) 1 G CAPSULE    Take 1 g by mouth 2 (two) times daily.    POLYETHYL GLYCOL-PROPYL GLYCOL (SYSTANE) 0.4-0.3 % SOLN    Apply to eye.   SILVER SULFADIAZINE (SILVADENE) 1 % CREAM    Apply 1 application topically daily. Applies to rear   SODIUM PHOSPHATE (FLEET) 7-19 GM/118ML ENEM    Place 1 enema rectally daily as needed.   TEMAZEPAM (RESTORIL) 15 MG CAPSULE    Take 1 capsule (15 mg total) by mouth at bedtime.   VANCOMYCIN (VANCOCIN) 1 GM/200ML SOLN    Inject 200 mLs (1,000 mg total) into the vein daily.   VITAMIN C (ASCORBIC  ACID) 500 MG TABLET    Take 500 mg by mouth daily.    WARFARIN (COUMADIN) 5 MG TABLET    Take 7.5 mg by mouth daily. Takes 1 tablet daily on mondays and wednesdays  Modified Medications   Modified Medication Previous Medication   LORAZEPAM (ATIVAN) 0.5 MG TABLET LORazepam (ATIVAN) 0.5 MG tablet      Take 1 mg by mouth at bedtime. For anxiety    Take 2 tablets (1 mg total) by mouth at bedtime. For anxiety  Discontinued Medications   No medications on file    Subjective: [name] is in for his routine followup visit. His daughter and his primary caregiver her with him. He is now being discharged to a skilled nursing facility in his back home with outpatient hospice care. Apparently his medications have been reviewed thoroughly and simplified. His diet wanted was stopped and he has been changed to low-dose methadone with hydrocodone for breakthrough pain. His daughter states that he is doing much better. He is much more alert, eating better and more physically active. He has not had any fever, chills, or sweats.  He has now been off of his antibiotics for MRSA bacteremia for one month after a six-week course.  Objective: Temp: 97.3 F (36.3 C) (09/24 1100) Temp src: Oral (09/24 1100) BP: 101/64 mmHg (09/24 1100) Pulse Rate: 77  (09/24 1100)  General: He is much more alert sitting up in his wheelchair Skin: No splinter or conjunctival hemorrhages Lungs: Clear Cor: Regular S1 and S2 with no murmurs Left upper chest pacemaker site appears normal   Assessment: He is doing much better and am quite hopeful that his MRSA bacteremia has been cured with 6 weeks of vancomycin and rifampin. However with his pacemaker in place there is always some risk of early relapse. I will continue observation off of antibiotics but repeat blood cultures today.  Plan: 1. Continue observation off of antibiotics 2. Blood cultures x2   Cliffton Asters, MD [facility] for Infectious Disease [facility] [phone] pager   [phone] cell 03/07/2012, 12:35 PM

## 2012-03-07 NOTE — Addendum Note (Signed)
Addended by: Jennet Maduro D on: 03/07/2012 01:04 PM   Modules accepted: Orders

## 2012-03-09 DIAGNOSIS — I4891 Unspecified atrial fibrillation: Secondary | ICD-10-CM | POA: Diagnosis not present

## 2012-03-09 DIAGNOSIS — I509 Heart failure, unspecified: Secondary | ICD-10-CM | POA: Diagnosis not present

## 2012-03-09 DIAGNOSIS — I2510 Atherosclerotic heart disease of native coronary artery without angina pectoris: Secondary | ICD-10-CM | POA: Diagnosis not present

## 2012-03-13 DIAGNOSIS — I2510 Atherosclerotic heart disease of native coronary artery without angina pectoris: Secondary | ICD-10-CM | POA: Diagnosis not present

## 2012-03-13 DIAGNOSIS — I509 Heart failure, unspecified: Secondary | ICD-10-CM | POA: Diagnosis not present

## 2012-03-13 DIAGNOSIS — I4891 Unspecified atrial fibrillation: Secondary | ICD-10-CM | POA: Diagnosis not present

## 2012-03-14 DIAGNOSIS — I509 Heart failure, unspecified: Secondary | ICD-10-CM | POA: Diagnosis not present

## 2012-03-31 DIAGNOSIS — Z950 Presence of cardiac pacemaker: Secondary | ICD-10-CM | POA: Diagnosis not present

## 2012-03-31 DIAGNOSIS — Z7901 Long term (current) use of anticoagulants: Secondary | ICD-10-CM | POA: Diagnosis not present

## 2012-03-31 DIAGNOSIS — I059 Rheumatic mitral valve disease, unspecified: Secondary | ICD-10-CM | POA: Diagnosis not present

## 2012-03-31 DIAGNOSIS — I5022 Chronic systolic (congestive) heart failure: Secondary | ICD-10-CM | POA: Diagnosis not present

## 2012-03-31 DIAGNOSIS — I4891 Unspecified atrial fibrillation: Secondary | ICD-10-CM | POA: Diagnosis not present

## 2012-04-14 DIAGNOSIS — I509 Heart failure, unspecified: Secondary | ICD-10-CM | POA: Diagnosis not present

## 2012-05-14 DIAGNOSIS — I509 Heart failure, unspecified: Secondary | ICD-10-CM | POA: Diagnosis not present

## 2012-05-30 DIAGNOSIS — M75120 Complete rotator cuff tear or rupture of unspecified shoulder, not specified as traumatic: Secondary | ICD-10-CM | POA: Diagnosis not present

## 2012-06-14 DIAGNOSIS — I509 Heart failure, unspecified: Secondary | ICD-10-CM | POA: Diagnosis not present

## 2012-07-14 DIAGNOSIS — R42 Dizziness and giddiness: Secondary | ICD-10-CM | POA: Diagnosis not present

## 2012-07-14 DIAGNOSIS — R269 Unspecified abnormalities of gait and mobility: Secondary | ICD-10-CM | POA: Diagnosis not present

## 2012-07-15 DIAGNOSIS — I509 Heart failure, unspecified: Secondary | ICD-10-CM | POA: Diagnosis not present

## 2012-07-17 DIAGNOSIS — I509 Heart failure, unspecified: Secondary | ICD-10-CM | POA: Diagnosis not present

## 2012-07-24 DIAGNOSIS — I509 Heart failure, unspecified: Secondary | ICD-10-CM | POA: Diagnosis not present

## 2012-07-26 DIAGNOSIS — I509 Heart failure, unspecified: Secondary | ICD-10-CM | POA: Diagnosis not present

## 2012-07-31 DIAGNOSIS — I509 Heart failure, unspecified: Secondary | ICD-10-CM | POA: Diagnosis not present

## 2012-08-15 DIAGNOSIS — K5900 Constipation, unspecified: Secondary | ICD-10-CM | POA: Diagnosis not present

## 2012-08-15 DIAGNOSIS — R413 Other amnesia: Secondary | ICD-10-CM | POA: Diagnosis not present

## 2012-08-15 DIAGNOSIS — R0609 Other forms of dyspnea: Secondary | ICD-10-CM | POA: Diagnosis not present

## 2012-08-15 DIAGNOSIS — Z7901 Long term (current) use of anticoagulants: Secondary | ICD-10-CM | POA: Diagnosis not present

## 2012-08-15 DIAGNOSIS — I10 Essential (primary) hypertension: Secondary | ICD-10-CM | POA: Diagnosis not present

## 2012-08-15 DIAGNOSIS — R0989 Other specified symptoms and signs involving the circulatory and respiratory systems: Secondary | ICD-10-CM | POA: Diagnosis not present

## 2012-08-15 DIAGNOSIS — I4891 Unspecified atrial fibrillation: Secondary | ICD-10-CM | POA: Diagnosis not present

## 2012-08-21 DIAGNOSIS — L989 Disorder of the skin and subcutaneous tissue, unspecified: Secondary | ICD-10-CM | POA: Diagnosis not present

## 2012-08-21 DIAGNOSIS — R413 Other amnesia: Secondary | ICD-10-CM | POA: Diagnosis not present

## 2012-08-24 DIAGNOSIS — I509 Heart failure, unspecified: Secondary | ICD-10-CM | POA: Diagnosis not present

## 2012-08-29 DIAGNOSIS — J441 Chronic obstructive pulmonary disease with (acute) exacerbation: Secondary | ICD-10-CM | POA: Diagnosis not present

## 2012-08-31 DIAGNOSIS — L989 Disorder of the skin and subcutaneous tissue, unspecified: Secondary | ICD-10-CM | POA: Diagnosis not present

## 2012-08-31 DIAGNOSIS — I509 Heart failure, unspecified: Secondary | ICD-10-CM | POA: Diagnosis not present

## 2012-09-06 DIAGNOSIS — I4891 Unspecified atrial fibrillation: Secondary | ICD-10-CM | POA: Diagnosis not present

## 2012-09-06 DIAGNOSIS — L989 Disorder of the skin and subcutaneous tissue, unspecified: Secondary | ICD-10-CM | POA: Diagnosis not present

## 2012-09-06 DIAGNOSIS — I10 Essential (primary) hypertension: Secondary | ICD-10-CM | POA: Diagnosis not present

## 2012-09-06 DIAGNOSIS — I509 Heart failure, unspecified: Secondary | ICD-10-CM | POA: Diagnosis not present

## 2012-09-06 DIAGNOSIS — R413 Other amnesia: Secondary | ICD-10-CM | POA: Diagnosis not present

## 2012-09-06 DIAGNOSIS — J441 Chronic obstructive pulmonary disease with (acute) exacerbation: Secondary | ICD-10-CM | POA: Diagnosis not present

## 2012-09-07 DIAGNOSIS — I509 Heart failure, unspecified: Secondary | ICD-10-CM | POA: Diagnosis not present

## 2012-09-07 DIAGNOSIS — L989 Disorder of the skin and subcutaneous tissue, unspecified: Secondary | ICD-10-CM | POA: Diagnosis not present

## 2012-09-07 DIAGNOSIS — I10 Essential (primary) hypertension: Secondary | ICD-10-CM | POA: Diagnosis not present

## 2012-09-07 DIAGNOSIS — J441 Chronic obstructive pulmonary disease with (acute) exacerbation: Secondary | ICD-10-CM | POA: Diagnosis not present

## 2012-09-07 DIAGNOSIS — R413 Other amnesia: Secondary | ICD-10-CM | POA: Diagnosis not present

## 2012-09-07 DIAGNOSIS — I4891 Unspecified atrial fibrillation: Secondary | ICD-10-CM | POA: Diagnosis not present

## 2012-09-11 DIAGNOSIS — R413 Other amnesia: Secondary | ICD-10-CM | POA: Diagnosis not present

## 2012-09-11 DIAGNOSIS — J441 Chronic obstructive pulmonary disease with (acute) exacerbation: Secondary | ICD-10-CM | POA: Diagnosis not present

## 2012-09-11 DIAGNOSIS — I10 Essential (primary) hypertension: Secondary | ICD-10-CM | POA: Diagnosis not present

## 2012-09-11 DIAGNOSIS — L989 Disorder of the skin and subcutaneous tissue, unspecified: Secondary | ICD-10-CM | POA: Diagnosis not present

## 2012-09-11 DIAGNOSIS — I4891 Unspecified atrial fibrillation: Secondary | ICD-10-CM | POA: Diagnosis not present

## 2012-09-11 DIAGNOSIS — I509 Heart failure, unspecified: Secondary | ICD-10-CM | POA: Diagnosis not present

## 2012-09-12 DIAGNOSIS — R413 Other amnesia: Secondary | ICD-10-CM | POA: Diagnosis not present

## 2012-09-12 DIAGNOSIS — J441 Chronic obstructive pulmonary disease with (acute) exacerbation: Secondary | ICD-10-CM | POA: Diagnosis not present

## 2012-09-12 DIAGNOSIS — I10 Essential (primary) hypertension: Secondary | ICD-10-CM | POA: Diagnosis not present

## 2012-09-12 DIAGNOSIS — I4891 Unspecified atrial fibrillation: Secondary | ICD-10-CM | POA: Diagnosis not present

## 2012-09-12 DIAGNOSIS — I509 Heart failure, unspecified: Secondary | ICD-10-CM | POA: Diagnosis not present

## 2012-09-12 DIAGNOSIS — L989 Disorder of the skin and subcutaneous tissue, unspecified: Secondary | ICD-10-CM | POA: Diagnosis not present

## 2012-09-14 DIAGNOSIS — R413 Other amnesia: Secondary | ICD-10-CM | POA: Diagnosis not present

## 2012-09-14 DIAGNOSIS — I4891 Unspecified atrial fibrillation: Secondary | ICD-10-CM | POA: Diagnosis not present

## 2012-09-14 DIAGNOSIS — J441 Chronic obstructive pulmonary disease with (acute) exacerbation: Secondary | ICD-10-CM | POA: Diagnosis not present

## 2012-09-14 DIAGNOSIS — I509 Heart failure, unspecified: Secondary | ICD-10-CM | POA: Diagnosis not present

## 2012-09-14 DIAGNOSIS — N390 Urinary tract infection, site not specified: Secondary | ICD-10-CM | POA: Diagnosis not present

## 2012-09-14 DIAGNOSIS — L989 Disorder of the skin and subcutaneous tissue, unspecified: Secondary | ICD-10-CM | POA: Diagnosis not present

## 2012-09-14 DIAGNOSIS — I10 Essential (primary) hypertension: Secondary | ICD-10-CM | POA: Diagnosis not present

## 2012-09-18 DIAGNOSIS — I4891 Unspecified atrial fibrillation: Secondary | ICD-10-CM | POA: Diagnosis not present

## 2012-09-18 DIAGNOSIS — I10 Essential (primary) hypertension: Secondary | ICD-10-CM | POA: Diagnosis not present

## 2012-09-18 DIAGNOSIS — L989 Disorder of the skin and subcutaneous tissue, unspecified: Secondary | ICD-10-CM | POA: Diagnosis not present

## 2012-09-18 DIAGNOSIS — J441 Chronic obstructive pulmonary disease with (acute) exacerbation: Secondary | ICD-10-CM | POA: Diagnosis not present

## 2012-09-18 DIAGNOSIS — I509 Heart failure, unspecified: Secondary | ICD-10-CM | POA: Diagnosis not present

## 2012-09-18 DIAGNOSIS — R413 Other amnesia: Secondary | ICD-10-CM | POA: Diagnosis not present

## 2012-09-20 DIAGNOSIS — I10 Essential (primary) hypertension: Secondary | ICD-10-CM | POA: Diagnosis not present

## 2012-09-20 DIAGNOSIS — L989 Disorder of the skin and subcutaneous tissue, unspecified: Secondary | ICD-10-CM | POA: Diagnosis not present

## 2012-09-20 DIAGNOSIS — J441 Chronic obstructive pulmonary disease with (acute) exacerbation: Secondary | ICD-10-CM | POA: Diagnosis not present

## 2012-09-20 DIAGNOSIS — I509 Heart failure, unspecified: Secondary | ICD-10-CM | POA: Diagnosis not present

## 2012-09-20 DIAGNOSIS — R413 Other amnesia: Secondary | ICD-10-CM | POA: Diagnosis not present

## 2012-09-20 DIAGNOSIS — I4891 Unspecified atrial fibrillation: Secondary | ICD-10-CM | POA: Diagnosis not present

## 2012-09-22 DIAGNOSIS — L989 Disorder of the skin and subcutaneous tissue, unspecified: Secondary | ICD-10-CM | POA: Diagnosis not present

## 2012-09-22 DIAGNOSIS — I10 Essential (primary) hypertension: Secondary | ICD-10-CM | POA: Diagnosis not present

## 2012-09-22 DIAGNOSIS — R413 Other amnesia: Secondary | ICD-10-CM | POA: Diagnosis not present

## 2012-09-22 DIAGNOSIS — I4891 Unspecified atrial fibrillation: Secondary | ICD-10-CM | POA: Diagnosis not present

## 2012-09-22 DIAGNOSIS — J441 Chronic obstructive pulmonary disease with (acute) exacerbation: Secondary | ICD-10-CM | POA: Diagnosis not present

## 2012-09-22 DIAGNOSIS — I509 Heart failure, unspecified: Secondary | ICD-10-CM | POA: Diagnosis not present

## 2012-09-25 DIAGNOSIS — R413 Other amnesia: Secondary | ICD-10-CM | POA: Diagnosis not present

## 2012-09-25 DIAGNOSIS — L989 Disorder of the skin and subcutaneous tissue, unspecified: Secondary | ICD-10-CM | POA: Diagnosis not present

## 2012-09-25 DIAGNOSIS — I10 Essential (primary) hypertension: Secondary | ICD-10-CM | POA: Diagnosis not present

## 2012-09-25 DIAGNOSIS — I4891 Unspecified atrial fibrillation: Secondary | ICD-10-CM | POA: Diagnosis not present

## 2012-09-25 DIAGNOSIS — I509 Heart failure, unspecified: Secondary | ICD-10-CM | POA: Diagnosis not present

## 2012-09-25 DIAGNOSIS — J441 Chronic obstructive pulmonary disease with (acute) exacerbation: Secondary | ICD-10-CM | POA: Diagnosis not present

## 2012-09-27 DIAGNOSIS — J441 Chronic obstructive pulmonary disease with (acute) exacerbation: Secondary | ICD-10-CM | POA: Diagnosis not present

## 2012-09-27 DIAGNOSIS — L989 Disorder of the skin and subcutaneous tissue, unspecified: Secondary | ICD-10-CM | POA: Diagnosis not present

## 2012-09-27 DIAGNOSIS — I10 Essential (primary) hypertension: Secondary | ICD-10-CM | POA: Diagnosis not present

## 2012-09-27 DIAGNOSIS — I4891 Unspecified atrial fibrillation: Secondary | ICD-10-CM | POA: Diagnosis not present

## 2012-09-27 DIAGNOSIS — R413 Other amnesia: Secondary | ICD-10-CM | POA: Diagnosis not present

## 2012-09-27 DIAGNOSIS — I509 Heart failure, unspecified: Secondary | ICD-10-CM | POA: Diagnosis not present

## 2012-10-02 DIAGNOSIS — R413 Other amnesia: Secondary | ICD-10-CM | POA: Diagnosis not present

## 2012-10-02 DIAGNOSIS — I4891 Unspecified atrial fibrillation: Secondary | ICD-10-CM | POA: Diagnosis not present

## 2012-10-02 DIAGNOSIS — I509 Heart failure, unspecified: Secondary | ICD-10-CM | POA: Diagnosis not present

## 2012-10-02 DIAGNOSIS — I10 Essential (primary) hypertension: Secondary | ICD-10-CM | POA: Diagnosis not present

## 2012-10-02 DIAGNOSIS — J441 Chronic obstructive pulmonary disease with (acute) exacerbation: Secondary | ICD-10-CM | POA: Diagnosis not present

## 2012-10-02 DIAGNOSIS — L989 Disorder of the skin and subcutaneous tissue, unspecified: Secondary | ICD-10-CM | POA: Diagnosis not present

## 2012-10-05 DIAGNOSIS — I10 Essential (primary) hypertension: Secondary | ICD-10-CM | POA: Diagnosis not present

## 2012-10-05 DIAGNOSIS — J441 Chronic obstructive pulmonary disease with (acute) exacerbation: Secondary | ICD-10-CM | POA: Diagnosis not present

## 2012-10-05 DIAGNOSIS — L989 Disorder of the skin and subcutaneous tissue, unspecified: Secondary | ICD-10-CM | POA: Diagnosis not present

## 2012-10-05 DIAGNOSIS — I509 Heart failure, unspecified: Secondary | ICD-10-CM | POA: Diagnosis not present

## 2012-10-05 DIAGNOSIS — I4891 Unspecified atrial fibrillation: Secondary | ICD-10-CM | POA: Diagnosis not present

## 2012-10-05 DIAGNOSIS — R413 Other amnesia: Secondary | ICD-10-CM | POA: Diagnosis not present

## 2012-10-06 DIAGNOSIS — I509 Heart failure, unspecified: Secondary | ICD-10-CM | POA: Diagnosis not present

## 2012-10-06 DIAGNOSIS — R413 Other amnesia: Secondary | ICD-10-CM | POA: Diagnosis not present

## 2012-10-06 DIAGNOSIS — I4891 Unspecified atrial fibrillation: Secondary | ICD-10-CM | POA: Diagnosis not present

## 2012-10-06 DIAGNOSIS — I10 Essential (primary) hypertension: Secondary | ICD-10-CM | POA: Diagnosis not present

## 2012-10-06 DIAGNOSIS — J441 Chronic obstructive pulmonary disease with (acute) exacerbation: Secondary | ICD-10-CM | POA: Diagnosis not present

## 2012-10-06 DIAGNOSIS — L989 Disorder of the skin and subcutaneous tissue, unspecified: Secondary | ICD-10-CM | POA: Diagnosis not present

## 2012-10-09 DIAGNOSIS — I509 Heart failure, unspecified: Secondary | ICD-10-CM | POA: Diagnosis not present

## 2012-10-09 DIAGNOSIS — I4891 Unspecified atrial fibrillation: Secondary | ICD-10-CM | POA: Diagnosis not present

## 2012-10-09 DIAGNOSIS — R413 Other amnesia: Secondary | ICD-10-CM | POA: Diagnosis not present

## 2012-10-09 DIAGNOSIS — J441 Chronic obstructive pulmonary disease with (acute) exacerbation: Secondary | ICD-10-CM | POA: Diagnosis not present

## 2012-10-09 DIAGNOSIS — I10 Essential (primary) hypertension: Secondary | ICD-10-CM | POA: Diagnosis not present

## 2012-10-09 DIAGNOSIS — L989 Disorder of the skin and subcutaneous tissue, unspecified: Secondary | ICD-10-CM | POA: Diagnosis not present

## 2012-10-11 DIAGNOSIS — J441 Chronic obstructive pulmonary disease with (acute) exacerbation: Secondary | ICD-10-CM | POA: Diagnosis not present

## 2012-10-11 DIAGNOSIS — I10 Essential (primary) hypertension: Secondary | ICD-10-CM | POA: Diagnosis not present

## 2012-10-11 DIAGNOSIS — I509 Heart failure, unspecified: Secondary | ICD-10-CM | POA: Diagnosis not present

## 2012-10-11 DIAGNOSIS — I4891 Unspecified atrial fibrillation: Secondary | ICD-10-CM | POA: Diagnosis not present

## 2012-10-11 DIAGNOSIS — L989 Disorder of the skin and subcutaneous tissue, unspecified: Secondary | ICD-10-CM | POA: Diagnosis not present

## 2012-10-11 DIAGNOSIS — R413 Other amnesia: Secondary | ICD-10-CM | POA: Diagnosis not present

## 2012-10-12 DIAGNOSIS — R413 Other amnesia: Secondary | ICD-10-CM | POA: Diagnosis not present

## 2012-10-12 DIAGNOSIS — J441 Chronic obstructive pulmonary disease with (acute) exacerbation: Secondary | ICD-10-CM | POA: Diagnosis not present

## 2012-10-12 DIAGNOSIS — L989 Disorder of the skin and subcutaneous tissue, unspecified: Secondary | ICD-10-CM | POA: Diagnosis not present

## 2012-10-12 DIAGNOSIS — I4891 Unspecified atrial fibrillation: Secondary | ICD-10-CM | POA: Diagnosis not present

## 2012-10-12 DIAGNOSIS — I509 Heart failure, unspecified: Secondary | ICD-10-CM | POA: Diagnosis not present

## 2012-10-12 DIAGNOSIS — I10 Essential (primary) hypertension: Secondary | ICD-10-CM | POA: Diagnosis not present

## 2012-10-16 DIAGNOSIS — I10 Essential (primary) hypertension: Secondary | ICD-10-CM | POA: Diagnosis not present

## 2012-10-16 DIAGNOSIS — I509 Heart failure, unspecified: Secondary | ICD-10-CM | POA: Diagnosis not present

## 2012-10-16 DIAGNOSIS — I4891 Unspecified atrial fibrillation: Secondary | ICD-10-CM | POA: Diagnosis not present

## 2012-10-16 DIAGNOSIS — R413 Other amnesia: Secondary | ICD-10-CM | POA: Diagnosis not present

## 2012-10-16 DIAGNOSIS — J441 Chronic obstructive pulmonary disease with (acute) exacerbation: Secondary | ICD-10-CM | POA: Diagnosis not present

## 2012-10-16 DIAGNOSIS — L989 Disorder of the skin and subcutaneous tissue, unspecified: Secondary | ICD-10-CM | POA: Diagnosis not present

## 2012-10-18 DIAGNOSIS — I10 Essential (primary) hypertension: Secondary | ICD-10-CM | POA: Diagnosis not present

## 2012-10-18 DIAGNOSIS — R413 Other amnesia: Secondary | ICD-10-CM | POA: Diagnosis not present

## 2012-10-18 DIAGNOSIS — I4891 Unspecified atrial fibrillation: Secondary | ICD-10-CM | POA: Diagnosis not present

## 2012-10-18 DIAGNOSIS — J441 Chronic obstructive pulmonary disease with (acute) exacerbation: Secondary | ICD-10-CM | POA: Diagnosis not present

## 2012-10-18 DIAGNOSIS — L989 Disorder of the skin and subcutaneous tissue, unspecified: Secondary | ICD-10-CM | POA: Diagnosis not present

## 2012-10-18 DIAGNOSIS — I509 Heart failure, unspecified: Secondary | ICD-10-CM | POA: Diagnosis not present

## 2012-10-19 DIAGNOSIS — L989 Disorder of the skin and subcutaneous tissue, unspecified: Secondary | ICD-10-CM | POA: Diagnosis not present

## 2012-10-19 DIAGNOSIS — J441 Chronic obstructive pulmonary disease with (acute) exacerbation: Secondary | ICD-10-CM | POA: Diagnosis not present

## 2012-10-19 DIAGNOSIS — R413 Other amnesia: Secondary | ICD-10-CM | POA: Diagnosis not present

## 2012-10-19 DIAGNOSIS — I509 Heart failure, unspecified: Secondary | ICD-10-CM | POA: Diagnosis not present

## 2012-10-19 DIAGNOSIS — I10 Essential (primary) hypertension: Secondary | ICD-10-CM | POA: Diagnosis not present

## 2012-10-19 DIAGNOSIS — I4891 Unspecified atrial fibrillation: Secondary | ICD-10-CM | POA: Diagnosis not present

## 2012-10-20 DIAGNOSIS — I509 Heart failure, unspecified: Secondary | ICD-10-CM | POA: Diagnosis not present

## 2012-10-20 DIAGNOSIS — J441 Chronic obstructive pulmonary disease with (acute) exacerbation: Secondary | ICD-10-CM | POA: Diagnosis not present

## 2012-10-20 DIAGNOSIS — L989 Disorder of the skin and subcutaneous tissue, unspecified: Secondary | ICD-10-CM | POA: Diagnosis not present

## 2012-10-20 DIAGNOSIS — Z7901 Long term (current) use of anticoagulants: Secondary | ICD-10-CM | POA: Diagnosis not present

## 2012-10-20 DIAGNOSIS — R413 Other amnesia: Secondary | ICD-10-CM | POA: Diagnosis not present

## 2012-10-20 DIAGNOSIS — I10 Essential (primary) hypertension: Secondary | ICD-10-CM | POA: Diagnosis not present

## 2012-10-20 DIAGNOSIS — I4891 Unspecified atrial fibrillation: Secondary | ICD-10-CM | POA: Diagnosis not present

## 2012-10-26 DIAGNOSIS — R413 Other amnesia: Secondary | ICD-10-CM | POA: Diagnosis not present

## 2012-10-26 DIAGNOSIS — I509 Heart failure, unspecified: Secondary | ICD-10-CM | POA: Diagnosis not present

## 2012-10-26 DIAGNOSIS — J441 Chronic obstructive pulmonary disease with (acute) exacerbation: Secondary | ICD-10-CM | POA: Diagnosis not present

## 2012-10-26 DIAGNOSIS — I4891 Unspecified atrial fibrillation: Secondary | ICD-10-CM | POA: Diagnosis not present

## 2012-10-26 DIAGNOSIS — L989 Disorder of the skin and subcutaneous tissue, unspecified: Secondary | ICD-10-CM | POA: Diagnosis not present

## 2012-10-26 DIAGNOSIS — I10 Essential (primary) hypertension: Secondary | ICD-10-CM | POA: Diagnosis not present

## 2012-10-30 DIAGNOSIS — B351 Tinea unguium: Secondary | ICD-10-CM | POA: Diagnosis not present

## 2012-10-30 DIAGNOSIS — M79609 Pain in unspecified limb: Secondary | ICD-10-CM | POA: Diagnosis not present

## 2012-11-02 DIAGNOSIS — J441 Chronic obstructive pulmonary disease with (acute) exacerbation: Secondary | ICD-10-CM | POA: Diagnosis not present

## 2012-11-02 DIAGNOSIS — R413 Other amnesia: Secondary | ICD-10-CM | POA: Diagnosis not present

## 2012-11-02 DIAGNOSIS — I4891 Unspecified atrial fibrillation: Secondary | ICD-10-CM | POA: Diagnosis not present

## 2012-11-02 DIAGNOSIS — I10 Essential (primary) hypertension: Secondary | ICD-10-CM | POA: Diagnosis not present

## 2012-11-02 DIAGNOSIS — I509 Heart failure, unspecified: Secondary | ICD-10-CM | POA: Diagnosis not present

## 2012-11-02 DIAGNOSIS — L989 Disorder of the skin and subcutaneous tissue, unspecified: Secondary | ICD-10-CM | POA: Diagnosis not present

## 2012-11-03 DIAGNOSIS — Z950 Presence of cardiac pacemaker: Secondary | ICD-10-CM | POA: Diagnosis not present

## 2012-11-03 DIAGNOSIS — I4891 Unspecified atrial fibrillation: Secondary | ICD-10-CM | POA: Diagnosis not present

## 2012-11-03 DIAGNOSIS — I059 Rheumatic mitral valve disease, unspecified: Secondary | ICD-10-CM | POA: Diagnosis not present

## 2012-11-03 DIAGNOSIS — I10 Essential (primary) hypertension: Secondary | ICD-10-CM | POA: Diagnosis not present

## 2012-11-03 DIAGNOSIS — I5022 Chronic systolic (congestive) heart failure: Secondary | ICD-10-CM | POA: Diagnosis not present

## 2012-11-08 DIAGNOSIS — I10 Essential (primary) hypertension: Secondary | ICD-10-CM | POA: Diagnosis not present

## 2012-11-08 DIAGNOSIS — R413 Other amnesia: Secondary | ICD-10-CM | POA: Diagnosis not present

## 2012-11-08 DIAGNOSIS — I4891 Unspecified atrial fibrillation: Secondary | ICD-10-CM | POA: Diagnosis not present

## 2012-11-08 DIAGNOSIS — I509 Heart failure, unspecified: Secondary | ICD-10-CM | POA: Diagnosis not present

## 2012-11-08 DIAGNOSIS — J441 Chronic obstructive pulmonary disease with (acute) exacerbation: Secondary | ICD-10-CM | POA: Diagnosis not present

## 2012-11-08 DIAGNOSIS — L989 Disorder of the skin and subcutaneous tissue, unspecified: Secondary | ICD-10-CM | POA: Diagnosis not present

## 2012-11-15 DIAGNOSIS — I10 Essential (primary) hypertension: Secondary | ICD-10-CM | POA: Diagnosis not present

## 2012-11-15 DIAGNOSIS — J441 Chronic obstructive pulmonary disease with (acute) exacerbation: Secondary | ICD-10-CM | POA: Diagnosis not present

## 2012-11-15 DIAGNOSIS — R413 Other amnesia: Secondary | ICD-10-CM | POA: Diagnosis not present

## 2012-11-15 DIAGNOSIS — L989 Disorder of the skin and subcutaneous tissue, unspecified: Secondary | ICD-10-CM | POA: Diagnosis not present

## 2012-11-15 DIAGNOSIS — I4891 Unspecified atrial fibrillation: Secondary | ICD-10-CM | POA: Diagnosis not present

## 2012-11-15 DIAGNOSIS — I509 Heart failure, unspecified: Secondary | ICD-10-CM | POA: Diagnosis not present

## 2012-11-23 DIAGNOSIS — I10 Essential (primary) hypertension: Secondary | ICD-10-CM | POA: Diagnosis not present

## 2012-11-23 DIAGNOSIS — L989 Disorder of the skin and subcutaneous tissue, unspecified: Secondary | ICD-10-CM | POA: Diagnosis not present

## 2012-11-23 DIAGNOSIS — J441 Chronic obstructive pulmonary disease with (acute) exacerbation: Secondary | ICD-10-CM | POA: Diagnosis not present

## 2012-11-23 DIAGNOSIS — R413 Other amnesia: Secondary | ICD-10-CM | POA: Diagnosis not present

## 2012-11-23 DIAGNOSIS — I509 Heart failure, unspecified: Secondary | ICD-10-CM | POA: Diagnosis not present

## 2012-11-23 DIAGNOSIS — I4891 Unspecified atrial fibrillation: Secondary | ICD-10-CM | POA: Diagnosis not present

## 2012-11-29 DIAGNOSIS — I509 Heart failure, unspecified: Secondary | ICD-10-CM | POA: Diagnosis not present

## 2012-11-29 DIAGNOSIS — R413 Other amnesia: Secondary | ICD-10-CM | POA: Diagnosis not present

## 2012-11-29 DIAGNOSIS — J441 Chronic obstructive pulmonary disease with (acute) exacerbation: Secondary | ICD-10-CM | POA: Diagnosis not present

## 2012-11-29 DIAGNOSIS — L989 Disorder of the skin and subcutaneous tissue, unspecified: Secondary | ICD-10-CM | POA: Diagnosis not present

## 2012-11-29 DIAGNOSIS — I4891 Unspecified atrial fibrillation: Secondary | ICD-10-CM | POA: Diagnosis not present

## 2012-11-29 DIAGNOSIS — I10 Essential (primary) hypertension: Secondary | ICD-10-CM | POA: Diagnosis not present

## 2012-12-06 DIAGNOSIS — I10 Essential (primary) hypertension: Secondary | ICD-10-CM | POA: Diagnosis not present

## 2012-12-06 DIAGNOSIS — J441 Chronic obstructive pulmonary disease with (acute) exacerbation: Secondary | ICD-10-CM | POA: Diagnosis not present

## 2012-12-06 DIAGNOSIS — R413 Other amnesia: Secondary | ICD-10-CM | POA: Diagnosis not present

## 2012-12-06 DIAGNOSIS — N390 Urinary tract infection, site not specified: Secondary | ICD-10-CM | POA: Diagnosis not present

## 2012-12-06 DIAGNOSIS — L989 Disorder of the skin and subcutaneous tissue, unspecified: Secondary | ICD-10-CM | POA: Diagnosis not present

## 2012-12-06 DIAGNOSIS — I509 Heart failure, unspecified: Secondary | ICD-10-CM | POA: Diagnosis not present

## 2012-12-06 DIAGNOSIS — I4891 Unspecified atrial fibrillation: Secondary | ICD-10-CM | POA: Diagnosis not present

## 2012-12-09 IMAGING — CR DG CHEST 2V
3 series · 3 of 3 positions shown · non-contrast
Comparison: CT chest of 04/25/2010 and chest x-ray of 03/06/2010

CLINICAL DATA: Shortness of breath, wheezing for several days

CHEST - 2 VIEW

[view not recorded (1 of 3)]
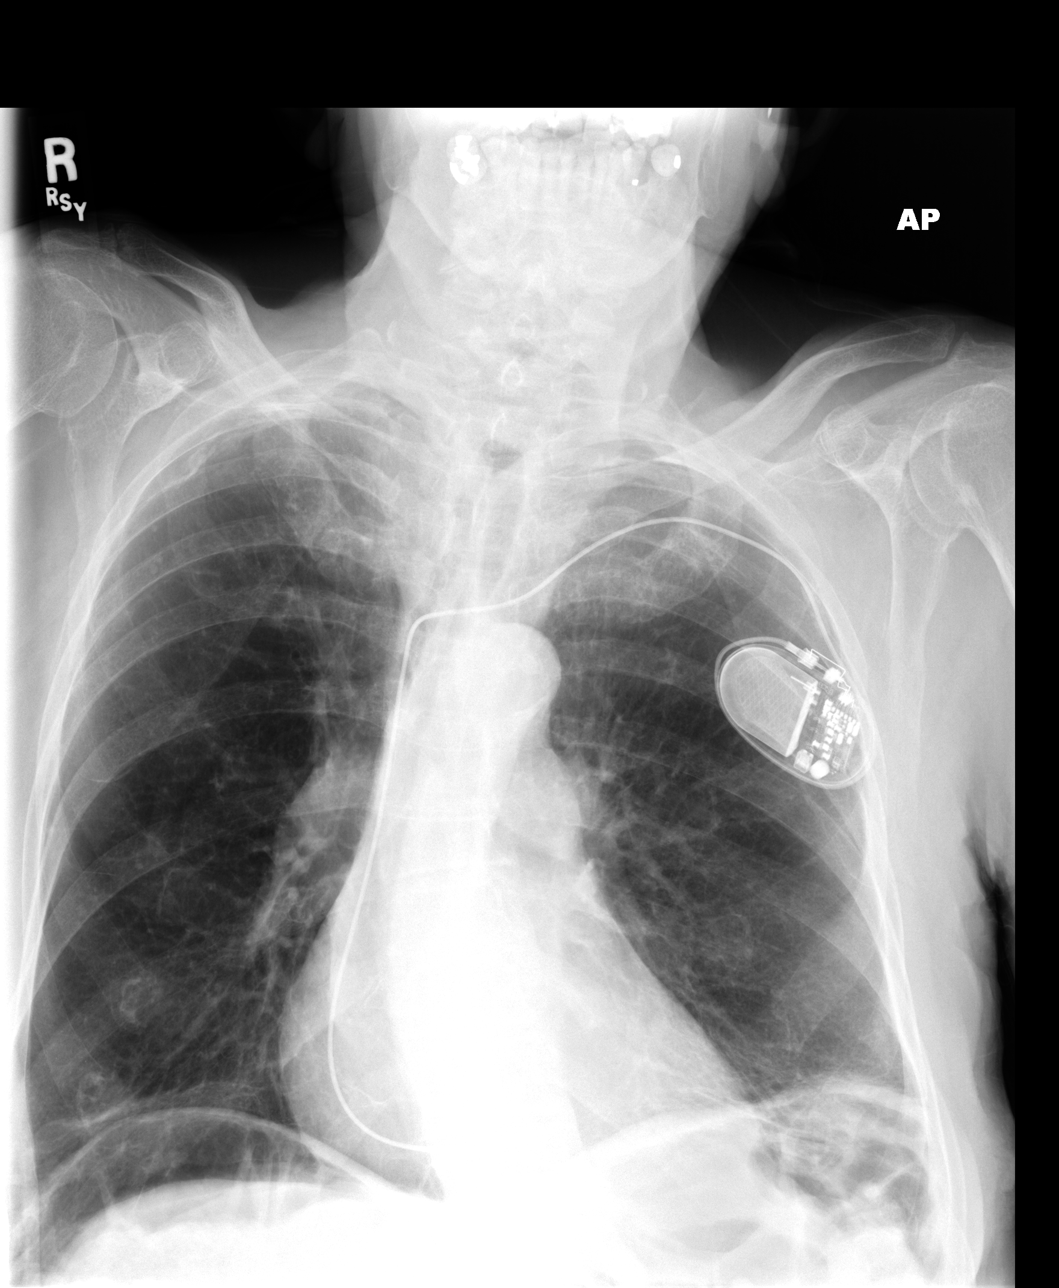

[view not recorded (2 of 3)]
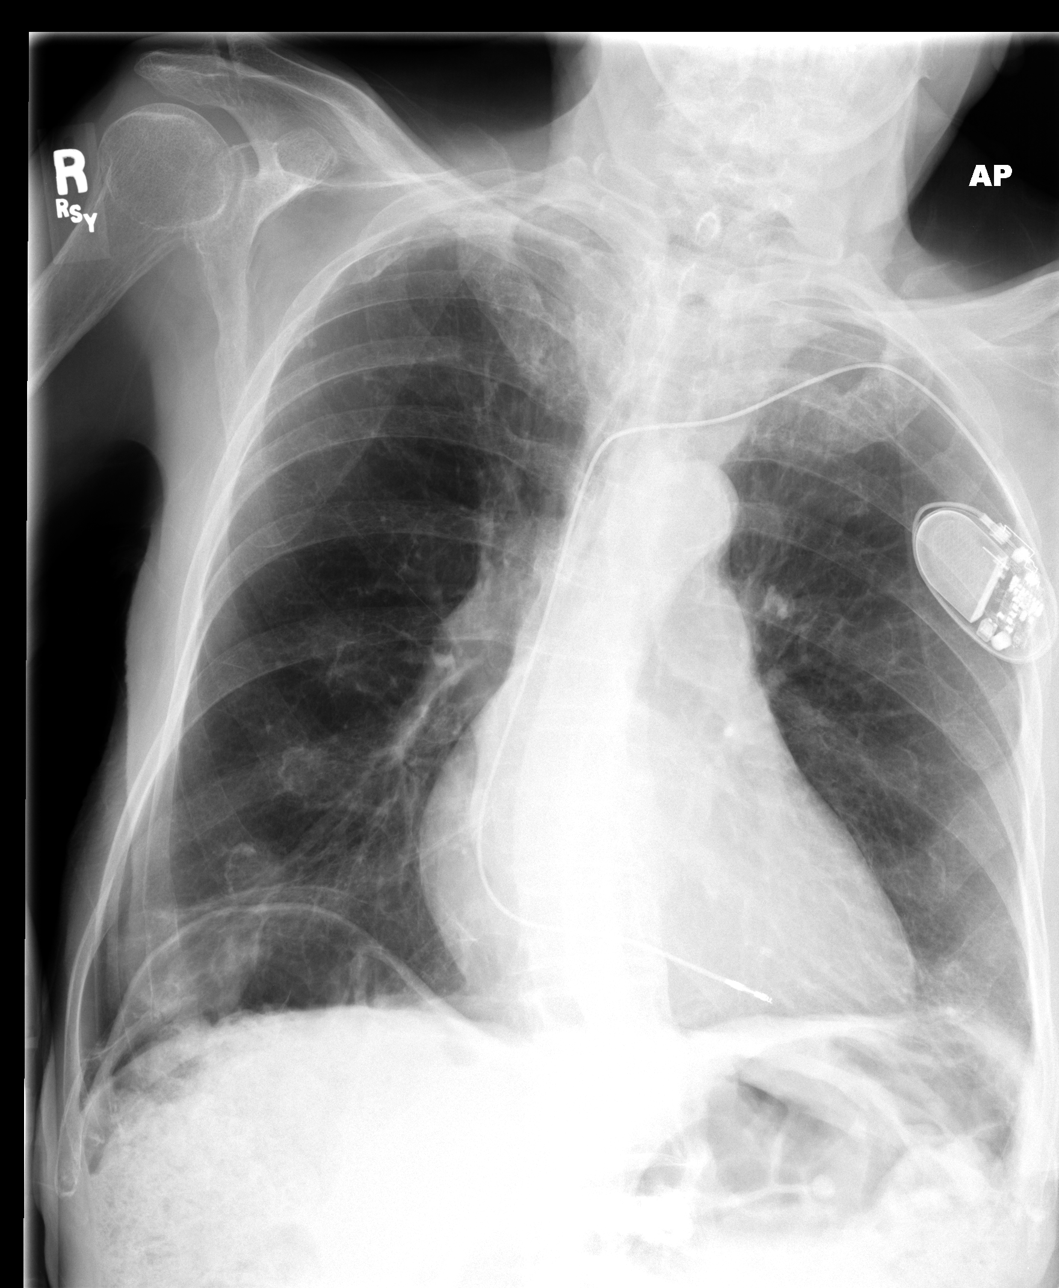

[view not recorded (3 of 3)]
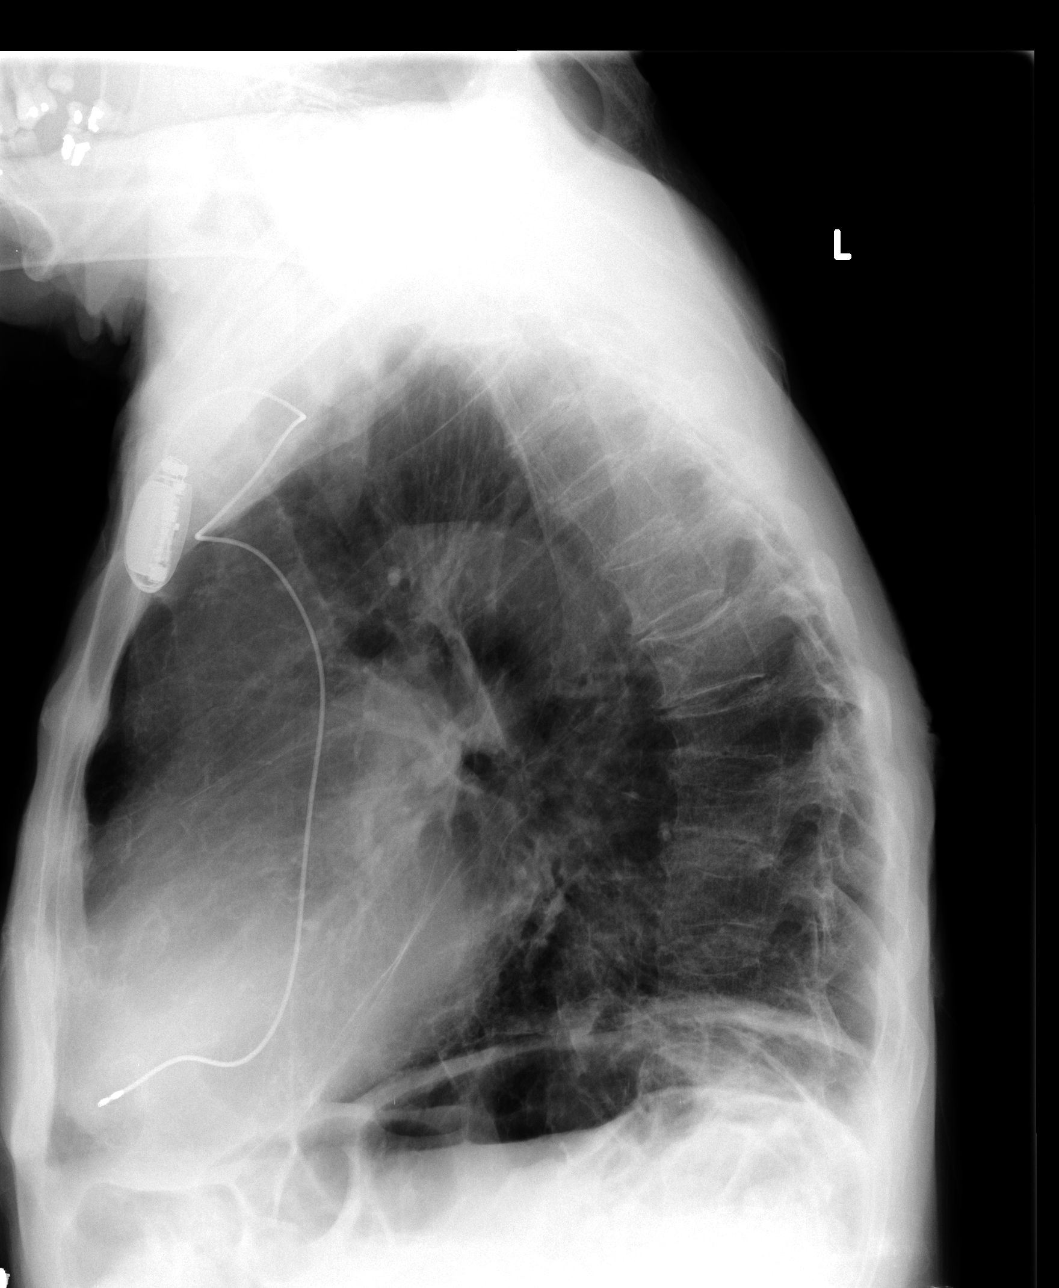

[3 of 3 positions shown; findings below may reference images not displayed]

FINDINGS: The lungs are clear but hyperaerated suggesting COPD.
There is lucency under the right hemidiaphragm which appears to be
within bowel most likely the hepatic flexure.  No definite free air
is noted.  There is mild cardiomegaly present which is stable.  A
single lead permanent pacemaker remains.  The bones are osteopenic
there is no change in mild compression deformity of T8 vertebral
body.
IMPRESSION: 1.  Hyperaeration consistent with COPD.  No active lung disease.
2.  Mild cardiomegaly.  Permanent pacemaker.
3.  Stable mild compression deformity of T8.  Diffuse osteopenia.

## 2012-12-13 DIAGNOSIS — I509 Heart failure, unspecified: Secondary | ICD-10-CM | POA: Diagnosis not present

## 2012-12-13 DIAGNOSIS — L989 Disorder of the skin and subcutaneous tissue, unspecified: Secondary | ICD-10-CM | POA: Diagnosis not present

## 2012-12-13 DIAGNOSIS — R413 Other amnesia: Secondary | ICD-10-CM | POA: Diagnosis not present

## 2012-12-13 DIAGNOSIS — J441 Chronic obstructive pulmonary disease with (acute) exacerbation: Secondary | ICD-10-CM | POA: Diagnosis not present

## 2012-12-13 DIAGNOSIS — I4891 Unspecified atrial fibrillation: Secondary | ICD-10-CM | POA: Diagnosis not present

## 2012-12-13 DIAGNOSIS — I10 Essential (primary) hypertension: Secondary | ICD-10-CM | POA: Diagnosis not present

## 2012-12-20 DIAGNOSIS — Z950 Presence of cardiac pacemaker: Secondary | ICD-10-CM | POA: Diagnosis not present

## 2012-12-20 DIAGNOSIS — I4430 Unspecified atrioventricular block: Secondary | ICD-10-CM | POA: Diagnosis not present

## 2013-01-02 DIAGNOSIS — Z1331 Encounter for screening for depression: Secondary | ICD-10-CM | POA: Diagnosis not present

## 2013-01-02 DIAGNOSIS — I501 Left ventricular failure: Secondary | ICD-10-CM | POA: Diagnosis not present

## 2013-01-02 DIAGNOSIS — E039 Hypothyroidism, unspecified: Secondary | ICD-10-CM | POA: Diagnosis not present

## 2013-01-02 DIAGNOSIS — R413 Other amnesia: Secondary | ICD-10-CM | POA: Diagnosis not present

## 2013-01-02 DIAGNOSIS — I10 Essential (primary) hypertension: Secondary | ICD-10-CM | POA: Diagnosis not present

## 2013-01-02 DIAGNOSIS — Z7901 Long term (current) use of anticoagulants: Secondary | ICD-10-CM | POA: Diagnosis not present

## 2013-01-02 DIAGNOSIS — I4891 Unspecified atrial fibrillation: Secondary | ICD-10-CM | POA: Diagnosis not present

## 2013-01-04 DIAGNOSIS — Z85828 Personal history of other malignant neoplasm of skin: Secondary | ICD-10-CM | POA: Diagnosis not present

## 2013-01-04 DIAGNOSIS — L299 Pruritus, unspecified: Secondary | ICD-10-CM | POA: Diagnosis not present

## 2013-01-17 ENCOUNTER — Ambulatory Visit (INDEPENDENT_AMBULATORY_CARE_PROVIDER_SITE_OTHER): Payer: Medicare Other | Admitting: Neurology

## 2013-01-17 ENCOUNTER — Encounter: Payer: Self-pay | Admitting: Neurology

## 2013-01-17 VITALS — Ht 67.0 in

## 2013-01-17 DIAGNOSIS — R413 Other amnesia: Secondary | ICD-10-CM | POA: Diagnosis not present

## 2013-01-17 DIAGNOSIS — R269 Unspecified abnormalities of gait and mobility: Secondary | ICD-10-CM | POA: Diagnosis not present

## 2013-01-17 DIAGNOSIS — G609 Hereditary and idiopathic neuropathy, unspecified: Secondary | ICD-10-CM | POA: Diagnosis not present

## 2013-01-17 MED ORDER — DONEPEZIL HCL 5 MG PO TABS: 5.0000 mg | ORAL_TABLET | Freq: Every day | ORAL | Status: AC

## 2013-01-17 MED ORDER — MEMANTINE HCL 10 MG PO TABS: 10.0000 mg | ORAL_TABLET | Freq: Two times a day (BID) | ORAL | Status: DC

## 2013-01-17 NOTE — Patient Instructions (Signed)
We can have you follow up with your primary care physician from now on. We will have you continue on your 2 memory medications.

## 2013-01-17 NOTE — Progress Notes (Signed)
Subjective:    Patient ID: [name] is a 79 y.o. male.  HPI  Interim history:   [name] is a very pleasant 79 year old RH gentleman who presents accompanied by his aide, Lashon, for FU consultation of his memory loss and gait disorder. He used to follow with Dr. Avie Echevaria and was last seen by him on 07/14/12, at which time, Dr. Sandria Manly felt, that the patient was stable with regards to his gait dysfunction and memory and he did not suggest and changes to his treatment or new test. The patient has a complex underlying medical history and I also reviewed his old records and Dr. Imagene Gurney notes: 79 year old right-handed gentleman with a history of gait disorder in the context of severe peripheral neuropathy, and underlying history of intermittent atrial fibrillation, heart disease, thyroid disease, and memory loss. He has a history of a fall with head trauma. He had a lumbar compression fracture and underwent kyphoplasty in 2011. He was hospitalized last year with a complicated urinary tract infection. He has had falls and was tapered off of Dilantin. He continues to be on MS Contin for back pain and knee pain. He is not a surgical candidate. He has been wheelchair-bound since last year. His MMSE scores have been in the 28 and 29 range. He is taking Aricept and Namenda for memory. He is situated in his wheelchair. He has no new complaints today. His aide has no new concerns.    His Past Medical History Is Significant For: Past Medical History  Diagnosis Date  . CHF (congestive heart failure)   . Arthritis   . Pacemaker   . Incontinence   . Pneumonia 2011; 07/29/11  . Neuropathy   . Heart murmur   . Dysrhythmia     paced  . Hypothyroidism   . Restless leg syndrome   . Blood transfusion   . Anemia   . Lower GI bleeding 2011    "from ATB I wasn't suppose to have had ordered"  . Dementia 07/29/11     not observed by this RN  . Atrial fibrillation     His Past Surgical History Is Significant  For: Past Surgical History  Procedure Laterality Date  . Insert / replace / remove pacemaker  ~ 2006    initial placement  . Insert / replace / remove pacemaker  05/2009  . Knee arthroscopy  ~ 2000's    right  . Back surgery      "as a kid"  . Inguinal hernia repair  1960's    left  . Inguinal hernia repair  1973    right    His Family History Is Significant For: No family history on file.  His Social History Is Significant For: History   Social History  . Marital Status: Married    Spouse Name: N/A    Number of Children: N/A  . Years of Education: N/A   Social History Main Topics  . Smoking status: Former Smoker -- 2.00 packs/day for 15 years    Types: Cigarettes    Quit date: 06/15/1971  . Smokeless tobacco: Never Used  . Alcohol Use: No     Comment: 07/29/11 "used to drink some; haven't now in quite a few years"  . Drug Use: No  . Sexually Active: Not Currently   Other Topics Concern  . None   Social History Narrative  . None    His Allergies Are:  Allergies  Allergen Reactions  . Penicillins  Hives  . Uroxatral (Alfuzosin Hydrochloride) Other (See Comments)    hypotension  :   His Current Medications Are:  Outpatient Encounter Prescriptions as of 01/17/2013  Medication Sig Dispense Refill  . Calcium Carbonate-Vitamin D (CALCIUM 600/VITAMIN D PO) Take 1 tablet by mouth 2 (two) times daily.       Marland Kitchen donepezil (ARICEPT) 5 MG tablet Take 5 mg by mouth at bedtime.       . feeding supplement (ENSURE COMPLETE) LIQD Take 237 mLs by mouth 2 (two) times daily between meals.  30 Bottle  0  . furosemide (LASIX) 40 MG tablet       . Ketamine HCl POWD       . levothyroxine (SYNTHROID, LEVOTHROID) 75 MCG tablet       . LORazepam (ATIVAN) 0.5 MG tablet Take 1 mg by mouth at bedtime. For anxiety      . memantine (NAMENDA) 10 MG tablet Take 10 mg by mouth 2 (two) times daily.      Marland Kitchen morphine (MS CONTIN) 15 MG 12 hr tablet       . Oxycodone HCl 10 MG TABS Take 10 mg by  mouth at bedtime as needed. For pain      . Polyethyl Glycol-Propyl Glycol (SYSTANE) 0.4-0.3 % SOLN Apply to eye.      . polyethylene glycol (MIRALAX / GLYCOLAX) packet Take 17 g by mouth daily.      . silver sulfADIAZINE (SILVADENE) 1 % cream Apply 1 application topically daily. Applies to rear      . temazepam (RESTORIL) 15 MG capsule Take 1 capsule (15 mg total) by mouth at bedtime.  30 capsule  0  . vitamin C (ASCORBIC ACID) 500 MG tablet Take 500 mg by mouth daily.       Marland Kitchen warfarin (COUMADIN) 5 MG tablet Take 7.5 mg by mouth daily. Takes 1 tablet daily on mondays and wednesdays      . [DISCONTINUED] citalopram (CELEXA) 10 MG tablet Take 10 mg by mouth at bedtime.       No facility-administered encounter medications on file as of 01/17/2013.  : Review of Systems  Constitutional: Positive for fatigue.  Skin: Positive for rash.  Neurological:       Memory loss, restless leg    Objective:  Neurologic Exam  Physical Exam Physical Examination:   There were no vitals filed for this visit.  General Examination: The patient is a very pleasant 79 y.o. male in no acute distress. He is very frail appearing. He is calm and cooperative with the exam. He is seated in his wheelchair.  HEENT: Normocephalic, atraumatic, pupils are equal, round and reactive to light and accommodation. Extraocular tracking shows mild saccadic breakdown without nystagmus noted. Hearing is impaired mildly. Face is symmetric with no facial masking and normal facial sensation. There is no lip, neck or jaw tremor. Neck is mildly rigid with intact passive ROM. There are no carotid bruits on auscultation. Oropharynx exam reveals moderate mouth dryness.   Chest: is clear to auscultation without wheezing, rhonchi or crackles noted.  Heart: sounds are regular and normal without murmurs, rubs or gallops noted.   Abdomen: is soft, non-tender and non-distended with normal bowel sounds appreciated on auscultation.  Extremities:  There is no pitting edema in the distal lower extremities bilaterally. Pedal pulses are intact.  Skin: is warm and dry with chronic skin changes noted.  Musculoskeletal: exam reveals no obvious joint deformities, tenderness or joint swelling or erythema.  Neurologically:  Mental status: The patient is awake and alert, paying good  attention. He is able to partially provide the history. His aide provides details. He is oriented to: place and situation. His memory, attention, language and knowledge are impaired mildly. There is no aphasia, agnosia, apraxia or anomia. There is a mild degree of bradyphrenia. Speech is mildly hypophonic with no dysarthria noted. Mood is congruent and affect is normal.   Cranial nerves are as described above under HEENT exam. In addition, shoulder shrug is normal with equal shoulder height noted.  Motor exam: thin bulk, and globally strength is 4-/5, weaker in the RLE. Tone is fairly normal. There is no drift or rebound. There is no tremor.  Romberg is negative. Reflexes are 1+ in the upper extremities and absent in the LEs. Fine motor skills are impaired.   Sensory exam is decreased to pinprick, vibration, and temperature sense in the lower extremities.   Gait, station and balance: He is not able to stand or walk.    Assessment and Plan:   In summary, NISHAN OVENS is a very pleasant 79 y.o.-year old male with a complicated medical history and progressive multifactorial gait disorder, stable memory loss, WC bound since last year, his exam has remained stable in the past several months. I am not sure that I can add anything in terms of workup or therapy at this time. As far as his memory I would continue him on Namenda and Aricept and explained to him that he can followup with his primary care physician long-term from here on onwards. Dr. Sandria Manly have explained to him that he may not be able to walk again. The patient understands this. He is agreeable to continuing  followup with his primary care physician and request his prescriptions for Namenda and Aricept from her. At this juncture I refilled his Aricept and Namenda so he does not run out and suggested that he routinely followup with his PCP. He and his aide were in agreement and did not have any additional questions prior to leaving clinic today.

## 2013-01-26 DIAGNOSIS — B354 Tinea corporis: Secondary | ICD-10-CM | POA: Diagnosis not present

## 2013-02-02 DIAGNOSIS — Z7901 Long term (current) use of anticoagulants: Secondary | ICD-10-CM | POA: Diagnosis not present

## 2013-02-14 DIAGNOSIS — B351 Tinea unguium: Secondary | ICD-10-CM | POA: Diagnosis not present

## 2013-02-14 DIAGNOSIS — M79609 Pain in unspecified limb: Secondary | ICD-10-CM | POA: Diagnosis not present

## 2013-03-05 DIAGNOSIS — Z23 Encounter for immunization: Secondary | ICD-10-CM | POA: Diagnosis not present

## 2013-03-05 DIAGNOSIS — Z7901 Long term (current) use of anticoagulants: Secondary | ICD-10-CM | POA: Diagnosis not present

## 2013-03-26 ENCOUNTER — Encounter: Payer: Self-pay | Admitting: Internal Medicine

## 2013-03-26 DIAGNOSIS — I4891 Unspecified atrial fibrillation: Secondary | ICD-10-CM

## 2013-03-26 DIAGNOSIS — Z950 Presence of cardiac pacemaker: Secondary | ICD-10-CM | POA: Diagnosis not present

## 2013-04-03 DIAGNOSIS — Z7901 Long term (current) use of anticoagulants: Secondary | ICD-10-CM | POA: Diagnosis not present

## 2013-04-03 DIAGNOSIS — I4891 Unspecified atrial fibrillation: Secondary | ICD-10-CM | POA: Diagnosis not present

## 2013-05-14 DIAGNOSIS — E039 Hypothyroidism, unspecified: Secondary | ICD-10-CM | POA: Diagnosis not present

## 2013-05-14 DIAGNOSIS — I4891 Unspecified atrial fibrillation: Secondary | ICD-10-CM | POA: Diagnosis not present

## 2013-05-14 DIAGNOSIS — Z7901 Long term (current) use of anticoagulants: Secondary | ICD-10-CM | POA: Diagnosis not present

## 2013-05-15 ENCOUNTER — Other Ambulatory Visit: Payer: Self-pay | Admitting: Neurology

## 2013-05-16 DIAGNOSIS — M1710 Unilateral primary osteoarthritis, unspecified knee: Secondary | ICD-10-CM | POA: Diagnosis not present

## 2013-05-16 DIAGNOSIS — I5022 Chronic systolic (congestive) heart failure: Secondary | ICD-10-CM | POA: Diagnosis not present

## 2013-05-16 DIAGNOSIS — Z7901 Long term (current) use of anticoagulants: Secondary | ICD-10-CM | POA: Diagnosis not present

## 2013-05-16 DIAGNOSIS — I2789 Other specified pulmonary heart diseases: Secondary | ICD-10-CM | POA: Diagnosis not present

## 2013-05-16 DIAGNOSIS — Z9981 Dependence on supplemental oxygen: Secondary | ICD-10-CM | POA: Diagnosis not present

## 2013-05-16 DIAGNOSIS — I509 Heart failure, unspecified: Secondary | ICD-10-CM | POA: Diagnosis not present

## 2013-05-16 DIAGNOSIS — I4891 Unspecified atrial fibrillation: Secondary | ICD-10-CM | POA: Diagnosis not present

## 2013-05-18 DIAGNOSIS — I2789 Other specified pulmonary heart diseases: Secondary | ICD-10-CM | POA: Diagnosis not present

## 2013-05-18 DIAGNOSIS — I509 Heart failure, unspecified: Secondary | ICD-10-CM | POA: Diagnosis not present

## 2013-05-18 DIAGNOSIS — I4891 Unspecified atrial fibrillation: Secondary | ICD-10-CM | POA: Diagnosis not present

## 2013-05-18 DIAGNOSIS — M1710 Unilateral primary osteoarthritis, unspecified knee: Secondary | ICD-10-CM | POA: Diagnosis not present

## 2013-05-18 DIAGNOSIS — I5022 Chronic systolic (congestive) heart failure: Secondary | ICD-10-CM | POA: Diagnosis not present

## 2013-05-18 DIAGNOSIS — Z7901 Long term (current) use of anticoagulants: Secondary | ICD-10-CM | POA: Diagnosis not present

## 2013-05-22 DIAGNOSIS — I509 Heart failure, unspecified: Secondary | ICD-10-CM | POA: Diagnosis not present

## 2013-05-22 DIAGNOSIS — I5022 Chronic systolic (congestive) heart failure: Secondary | ICD-10-CM | POA: Diagnosis not present

## 2013-05-22 DIAGNOSIS — I2789 Other specified pulmonary heart diseases: Secondary | ICD-10-CM | POA: Diagnosis not present

## 2013-05-22 DIAGNOSIS — Z7901 Long term (current) use of anticoagulants: Secondary | ICD-10-CM | POA: Diagnosis not present

## 2013-05-22 DIAGNOSIS — I4891 Unspecified atrial fibrillation: Secondary | ICD-10-CM | POA: Diagnosis not present

## 2013-05-22 DIAGNOSIS — M1710 Unilateral primary osteoarthritis, unspecified knee: Secondary | ICD-10-CM | POA: Diagnosis not present

## 2013-05-24 DIAGNOSIS — Z7901 Long term (current) use of anticoagulants: Secondary | ICD-10-CM | POA: Diagnosis not present

## 2013-05-24 DIAGNOSIS — M1710 Unilateral primary osteoarthritis, unspecified knee: Secondary | ICD-10-CM | POA: Diagnosis not present

## 2013-05-24 DIAGNOSIS — I2789 Other specified pulmonary heart diseases: Secondary | ICD-10-CM | POA: Diagnosis not present

## 2013-05-24 DIAGNOSIS — I509 Heart failure, unspecified: Secondary | ICD-10-CM | POA: Diagnosis not present

## 2013-05-24 DIAGNOSIS — I5022 Chronic systolic (congestive) heart failure: Secondary | ICD-10-CM | POA: Diagnosis not present

## 2013-05-24 DIAGNOSIS — I4891 Unspecified atrial fibrillation: Secondary | ICD-10-CM | POA: Diagnosis not present

## 2013-05-29 DIAGNOSIS — I4891 Unspecified atrial fibrillation: Secondary | ICD-10-CM | POA: Diagnosis not present

## 2013-05-29 DIAGNOSIS — M1710 Unilateral primary osteoarthritis, unspecified knee: Secondary | ICD-10-CM | POA: Diagnosis not present

## 2013-05-29 DIAGNOSIS — Z7901 Long term (current) use of anticoagulants: Secondary | ICD-10-CM | POA: Diagnosis not present

## 2013-05-29 DIAGNOSIS — I509 Heart failure, unspecified: Secondary | ICD-10-CM | POA: Diagnosis not present

## 2013-05-29 DIAGNOSIS — I2789 Other specified pulmonary heart diseases: Secondary | ICD-10-CM | POA: Diagnosis not present

## 2013-05-29 DIAGNOSIS — I5022 Chronic systolic (congestive) heart failure: Secondary | ICD-10-CM | POA: Diagnosis not present

## 2013-06-08 ENCOUNTER — Other Ambulatory Visit: Payer: Self-pay | Admitting: Neurology

## 2013-06-08 NOTE — Telephone Encounter (Signed)
Last OV says: The patient understands this. He is agreeable to continuing followup with his primary care physician and request his prescriptions for Namenda and Aricept from her.

## 2013-06-12 DIAGNOSIS — I4891 Unspecified atrial fibrillation: Secondary | ICD-10-CM | POA: Diagnosis not present

## 2013-06-12 DIAGNOSIS — I2789 Other specified pulmonary heart diseases: Secondary | ICD-10-CM | POA: Diagnosis not present

## 2013-06-12 DIAGNOSIS — Z7901 Long term (current) use of anticoagulants: Secondary | ICD-10-CM | POA: Diagnosis not present

## 2013-06-12 DIAGNOSIS — I509 Heart failure, unspecified: Secondary | ICD-10-CM | POA: Diagnosis not present

## 2013-06-12 DIAGNOSIS — M1710 Unilateral primary osteoarthritis, unspecified knee: Secondary | ICD-10-CM | POA: Diagnosis not present

## 2013-06-12 DIAGNOSIS — I5022 Chronic systolic (congestive) heart failure: Secondary | ICD-10-CM | POA: Diagnosis not present

## 2013-06-13 DIAGNOSIS — Z7901 Long term (current) use of anticoagulants: Secondary | ICD-10-CM | POA: Diagnosis not present

## 2013-06-13 DIAGNOSIS — I4891 Unspecified atrial fibrillation: Secondary | ICD-10-CM | POA: Diagnosis not present

## 2013-06-13 DIAGNOSIS — I10 Essential (primary) hypertension: Secondary | ICD-10-CM | POA: Diagnosis not present

## 2013-06-13 DIAGNOSIS — G8929 Other chronic pain: Secondary | ICD-10-CM | POA: Diagnosis not present

## 2013-06-13 DIAGNOSIS — I509 Heart failure, unspecified: Secondary | ICD-10-CM | POA: Diagnosis not present

## 2013-06-19 ENCOUNTER — Other Ambulatory Visit: Payer: Self-pay | Admitting: Neurology

## 2013-06-21 ENCOUNTER — Telehealth: Payer: Self-pay | Admitting: Neurology

## 2013-06-21 NOTE — Telephone Encounter (Signed)
I was not able to reach the patient.  I spoke with the pharmacist who said the meds were already picked up today.

## 2013-06-26 ENCOUNTER — Encounter: Payer: Self-pay | Admitting: Internal Medicine

## 2013-06-26 DIAGNOSIS — I4891 Unspecified atrial fibrillation: Secondary | ICD-10-CM

## 2013-06-26 DIAGNOSIS — Z950 Presence of cardiac pacemaker: Secondary | ICD-10-CM | POA: Diagnosis not present

## 2013-06-27 ENCOUNTER — Encounter: Admitting: Internal Medicine

## 2013-06-30 ENCOUNTER — Other Ambulatory Visit: Payer: Self-pay | Admitting: Neurology

## 2013-08-03 ENCOUNTER — Encounter: Admitting: Internal Medicine

## 2013-08-08 ENCOUNTER — Ambulatory Visit: Admitting: Interventional Cardiology

## 2013-08-16 ENCOUNTER — Ambulatory Visit: Admitting: Interventional Cardiology

## 2013-08-29 DIAGNOSIS — B351 Tinea unguium: Secondary | ICD-10-CM | POA: Diagnosis not present

## 2013-08-29 DIAGNOSIS — M79609 Pain in unspecified limb: Secondary | ICD-10-CM | POA: Diagnosis not present

## 2013-09-24 ENCOUNTER — Ambulatory Visit: Payer: Medicare Other | Admitting: Interventional Cardiology

## 2013-09-25 ENCOUNTER — Encounter: Payer: Self-pay | Admitting: Internal Medicine

## 2013-09-25 DIAGNOSIS — I4891 Unspecified atrial fibrillation: Secondary | ICD-10-CM

## 2013-09-25 DIAGNOSIS — Z950 Presence of cardiac pacemaker: Secondary | ICD-10-CM | POA: Diagnosis not present

## 2013-09-26 ENCOUNTER — Encounter: Payer: Medicare Other | Admitting: Internal Medicine

## 2013-09-28 ENCOUNTER — Encounter: Payer: Self-pay | Admitting: Internal Medicine

## 2013-09-28 ENCOUNTER — Ambulatory Visit (INDEPENDENT_AMBULATORY_CARE_PROVIDER_SITE_OTHER): Payer: 59 | Admitting: Internal Medicine

## 2013-09-28 ENCOUNTER — Ambulatory Visit: Payer: 59 | Admitting: Interventional Cardiology

## 2013-09-28 VITALS — BP 101/58 | HR 75 | Ht 65.0 in | Wt 120.0 lb

## 2013-09-28 DIAGNOSIS — Z950 Presence of cardiac pacemaker: Secondary | ICD-10-CM

## 2013-09-28 DIAGNOSIS — I442 Atrioventricular block, complete: Secondary | ICD-10-CM

## 2013-09-28 DIAGNOSIS — I4891 Unspecified atrial fibrillation: Secondary | ICD-10-CM

## 2013-09-28 LAB — MDC_IDC_ENUM_SESS_TYPE_INCLINIC
Battery Voltage: 2.95 V
Brady Statistic RV Percent Paced: 99.57 %
Implantable Pulse Generator Model: 1110
Implantable Pulse Generator Serial Number: 2303264
Lead Channel Impedance Value: 437.5 Ohm
Lead Channel Setting Pacing Amplitude: 1.125
Lead Channel Setting Pacing Pulse Width: 0.5 ms
Lead Channel Setting Sensing Sensitivity: 2.5 mV
MDC IDC MSMT BATTERY REMAINING LONGEVITY: 99.6 mo
MDC IDC MSMT LEADCHNL RV PACING THRESHOLD AMPLITUDE: 0.875 V
MDC IDC MSMT LEADCHNL RV PACING THRESHOLD PULSEWIDTH: 0.5 ms
MDC IDC MSMT LEADCHNL RV SENSING INTR AMPL: 5.5 mV
MDC IDC SESS DTM: 20150417103850

## 2013-09-28 NOTE — Progress Notes (Signed)
Edward Naas, MD: Primary Cardiologist:  Dr Milda Smart is a 80 y.o. male with a h/o complete heart block and permanent atrial fibrillation sp AV nodal ablation PPM (SJM) at [facility] who presents today to establish care in the Electrophysiology device clinic.   The patient reports doing very well since having a pacemaker implanted.  He presented with lead fracture in 2010 and required lead revision at [facility] at that time.  He has done well since.  He is not mobile due to weakness and chronic knee pain.  He has a depressed EF for which he does appear to be very symptomatic.   Today, he  denies symptoms of palpitations, chest pain, shortness of breath, orthopnea, PND, dizziness, presyncope, syncope, or neurologic sequela.  The patientis tolerating medications without difficulties and is otherwise without complaint today.   Past Medical History  Diagnosis Date  . CHF (congestive heart failure)   . Arthritis     Right knee  . Pacemaker     s/p AV nodal ablation at [facility]  . Incontinence   . Pneumonia 2011; 07/29/11  . Neuropathy   . Heart murmur   . Hypothyroidism   . Restless leg syndrome   . Blood transfusion   . Anemia   . Lower GI bleeding 2011    "from ATB I wasn't suppose to have had ordered"  . Dementia 07/29/11     not observed by this RN  . Permanent atrial fibrillation   . Dizziness and giddiness   . Essential hypertension, benign   . Orthostatic hypotension   . Mitral regurgitation     Severe, but unchanged clinical state. Decrease in LVFx is due to MR & possible silent MI.  . IBS (irritable bowel syndrome)   . Rectal pain   . Varicose veins     Many superficial veins along with peripheral edema  . Peripheral edema   . External hemorrhoid   . Internal hemorrhoid   . Systolic heart failure     EF 25% with anterior WMA, 12/2011.  . Corneal disease   . Pulmonary hypertension     Moderate  . Urinary frequency   . Macular degeneration    . Back fracture   . Chronic pain   . Bowel obstruction 02/2010    x 4 days   Past Surgical History  Procedure Laterality Date  . Pacemaker insertion  ~ 2006    initial placement  . Pacemaker revision  05/2009    RV lead revised due to lead fracture.  performed by Dr Koleen Nimrod at [facility]  . Knee arthroscopy  ~ 2000's    right  . Back surgery      "as a kid"  . Inguinal hernia repair  1960's    left  . Inguinal hernia repair  1973    right  . Av node ablation      at [facility]      Corneal, at [facility], both layers removed.  . Tonsillectomy      History   Social History  . Marital Status: Married    Spouse Name: N/A    Number of Children: N/A  . Years of Education: N/A   Occupational History  . Not on file.   Social History Main Topics  . Smoking status: Former Smoker -- 2.00 packs/day for 15 years    Types: Cigarettes    Quit date: 06/15/1971  . Smokeless tobacco:  Never Used  . Alcohol Use: No     Comment: 07/29/11 "used to drink some; haven't now in quite a few years"  . Drug Use: No  . Sexual Activity: Not Currently   Other Topics Concern  . 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   Has several patents.  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    Family History  Problem Relation Age of Onset  . Adopted: Yes  . Factor V Leiden deficiency Daughter     Allergies  Allergen Reactions  . Penicillins Hives  . Uroxatral [Alfuzosin Hydrochloride] Other (See Comments)    hypotension    Current Outpatient Prescriptions  Medication Sig Dispense Refill  . citalopram (CELEXA) 10 MG tablet Take 1 tablet by mouth daily.      . cyclobenzaprine (FLEXERIL) 10 MG tablet Take 10 mg by mouth 3 (three) times daily as needed for muscle spasms.      Marland Kitchen donepezil (ARICEPT) 5 MG tablet Take 1 tablet (5 mg total) by mouth at bedtime.  30 tablet  3  . hydrocortisone-pramoxine (ANALPRAM-HC) 2.5-1 %  rectal cream Place 1 application rectally 3 (three) times daily.      . hyoscyamine (LEVSIN, ANASPAZ) 0.125 MG tablet Take 0.125 mg by mouth as needed.      Marland Kitchen levothyroxine (SYNTHROID, LEVOTHROID) 75 MCG tablet Take 75 mcg by mouth daily before breakfast.       . LORazepam (ATIVAN) 0.5 MG tablet Take 1 mg by mouth every 8 (eight) hours as needed. For anxiety      . meclizine (ANTIVERT) 12.5 MG tablet Take 12.5 mg by mouth 3 (three) times daily as needed for dizziness.      Marland Kitchen morphine (MS CONTIN) 15 MG 12 hr tablet Take 15 mg by mouth every 12 (twelve) hours.       Marland Kitchen NAMENDA 10 MG tablet TAKE 1 TABLET BY MOUTH TWICE A DAY  30 tablet  0  . Omega-3 Fatty Acids (FISH OIL) 1000 MG CAPS Take 1 capsule by mouth daily.      . Oxycodone HCl 10 MG TABS Take 10 mg by mouth every 6 (six) hours as needed. For pain      . Polyethyl Glycol-Propyl Glycol (SYSTANE) 0.4-0.3 % SOLN Apply to eye.      . polyethylene glycol (MIRALAX / GLYCOLAX) packet Take 17 g by mouth daily.      . temazepam (RESTORIL) 15 MG capsule Take 1 capsule (15 mg total) by mouth at bedtime.  30 capsule  0  . warfarin (COUMADIN) 5 MG tablet Take as directed by the coumadin clinic       No current facility-administered medications for this visit.    ROS- all systems are reviewed and negative except as per HPI  Physical Exam: Filed Vitals:   09/28/13 1006  BP: 101/58  Pulse: 75  Height: 5\' 5"  (1.651 m)  Weight: 120 lb (54.432 kg)    GEN- The patient is elderly appearing, alert and oriented x 3 today.   Head- normocephalic, atraumatic Eyes-  Sclera clear, conjunctiva pink Ears- hearing intact Oropharynx- clear Neck- supple  Lungs- Clear to ausculation bilaterally, normal work of breathing Chest- pacemaker pocket is well healed Heart- Regular rate and rhythm (paced), 2/6 SEM LUSB which is mid to late peaking GI- soft, NT, ND, + BS Extremities- no clubbing, cyanosis, + dependant edema MS- diffuse atrophy, in a wheelchair  today Skin- no  rash or lesion Psych- euthymic mood, full affect Neuro- strength and sensation are intact  Pacemaker interrogation- reviewed in detail today,  See PACEART report  Assessment and Plan:  1. Complete heart block s/p AV nodal ablation Normal pacemaker function See Pace Art report No changes today  2. Permanent afib Rate controlled On coumadin  3. Chronic systolic dysfunction Given comorbidities, decreased mobility, and lack of CV symptoms, would be a poor candidate for device upgrade to CRT.  Will enroll in [facility] to see Jerene Pitch in 1 year Follow-up with Dr Tamala Julian as scheduled.

## 2013-09-28 NOTE — Patient Instructions (Signed)
Your physician wants you to follow-up in: 12 months with [name] Hence You will receive a reminder letter in the mail two months in advance. If you don't receive a letter, please call our office to schedule the follow-up appointment.

## 2013-11-08 ENCOUNTER — Ambulatory Visit (INDEPENDENT_AMBULATORY_CARE_PROVIDER_SITE_OTHER): Payer: 59 | Admitting: Interventional Cardiology

## 2013-11-08 ENCOUNTER — Encounter: Payer: Self-pay | Admitting: Interventional Cardiology

## 2013-11-08 VITALS — BP 98/56 | HR 70 | Ht 65.0 in | Wt 120.0 lb

## 2013-11-08 DIAGNOSIS — Z7901 Long term (current) use of anticoagulants: Secondary | ICD-10-CM | POA: Insufficient documentation

## 2013-11-08 DIAGNOSIS — I340 Nonrheumatic mitral (valve) insufficiency: Secondary | ICD-10-CM

## 2013-11-08 DIAGNOSIS — I4891 Unspecified atrial fibrillation: Secondary | ICD-10-CM

## 2013-11-08 DIAGNOSIS — I5041 Acute combined systolic (congestive) and diastolic (congestive) heart failure: Secondary | ICD-10-CM

## 2013-11-08 DIAGNOSIS — I059 Rheumatic mitral valve disease, unspecified: Secondary | ICD-10-CM

## 2013-11-08 DIAGNOSIS — Z950 Presence of cardiac pacemaker: Secondary | ICD-10-CM

## 2013-11-08 NOTE — Progress Notes (Signed)
Patient ID: [name], male   DOB: 11-21-1922, 80 y.o.   MRN: [id number]    [address] [place], [place]  [address] Phone: [phone] Fax:  [phone]  Date:  11/08/2013   ID:  [name], DOB 1923/02/02, MRN [id number]  PCP:  Reginia Naas, MD   ASSESSMENT:  1. Mitral regurgitation, severe without symptoms of heart failure 2. Chronic systolic heart failure, stable without evidence of decompensation 3. Chronic atrial fibrillation 4. Chronic anticoagulation therapy 5. AV node ablation for ventricular rate control  PLAN:  1. No change in current medical regimen 2. Clinical followup in one year 3. Call for heart failure complaints   SUBJECTIVE: [name] is a 80 y.o. male accompanied by his daughter or a Psychologist, counselling. He is doing well. He remains at home. He has no complaints of dyspnea, chest pain, or bleeding on anticoagulation. His appetite is improving. He overall feels stronger.   Wt Readings from Last 3 Encounters:  11/08/13 120 lb (54.432 kg)  09/28/13 120 lb (54.432 kg)  02/01/12 108 lb (48.988 kg)     Past Medical History  Diagnosis Date  . CHF (congestive heart failure)   . Arthritis     Right knee  . Pacemaker     s/p AV nodal ablation at [facility]  . Incontinence   . Pneumonia 2011; 07/29/11  . Neuropathy   . Heart murmur   . Hypothyroidism   . Restless leg syndrome   . Blood transfusion   . Anemia   . Lower GI bleeding 2011    "from ATB I wasn't suppose to have had ordered"  . Dementia 07/29/11     not observed by this RN  . Permanent atrial fibrillation   . Dizziness and giddiness   . Essential hypertension, benign   . Orthostatic hypotension   . Mitral regurgitation     Severe, but unchanged clinical state. Decrease in LVFx is due to MR & possible silent MI.  . IBS (irritable bowel syndrome)   . Rectal pain   . Varicose veins     Many superficial veins along with peripheral edema  .  Peripheral edema   . External hemorrhoid   . Internal hemorrhoid   . Systolic heart failure     EF 25% with anterior WMA, 12/2011.  . Corneal disease   . Pulmonary hypertension     Moderate  . Urinary frequency   . Macular degeneration   . Back fracture   . Chronic pain   . Bowel obstruction 02/2010    x 4 days    Current Outpatient Prescriptions  Medication Sig Dispense Refill  . citalopram (CELEXA) 10 MG tablet Take 1 tablet by mouth daily.      . cyclobenzaprine (FLEXERIL) 10 MG tablet Take 10 mg by mouth 3 (three) times daily as needed for muscle spasms.      Marland Kitchen donepezil (ARICEPT) 5 MG tablet Take 1 tablet (5 mg total) by mouth at bedtime.  30 tablet  3  . hydrocortisone-pramoxine (ANALPRAM-HC) 2.5-1 % rectal cream Place 1 application rectally 3 (three) times daily.      . hyoscyamine (LEVSIN, ANASPAZ) 0.125 MG tablet Take 0.125 mg by mouth as needed.      Marland Kitchen levothyroxine (SYNTHROID, LEVOTHROID) 75 MCG tablet Take 75 mcg by mouth daily before breakfast.       . LORazepam (ATIVAN) 0.5 MG tablet Take 1 mg by mouth every 8 (eight)  hours as needed. For anxiety      . meclizine (ANTIVERT) 12.5 MG tablet Take 12.5 mg by mouth 3 (three) times daily as needed for dizziness.      Marland Kitchen morphine (MS CONTIN) 15 MG 12 hr tablet Take 15 mg by mouth every 12 (twelve) hours.       Marland Kitchen NAMENDA 10 MG tablet TAKE 1 TABLET BY MOUTH TWICE A DAY  30 tablet  0  . Omega-3 Fatty Acids (FISH OIL) 1000 MG CAPS Take 1 capsule by mouth daily.      . Oxycodone HCl 10 MG TABS Take 10 mg by mouth every 6 (six) hours as needed. For pain      . Polyethyl Glycol-Propyl Glycol (SYSTANE) 0.4-0.3 % SOLN Apply to eye.      . polyethylene glycol (MIRALAX / GLYCOLAX) packet Take 17 g by mouth daily.      . temazepam (RESTORIL) 15 MG capsule Take 1 capsule (15 mg total) by mouth at bedtime.  30 capsule  0  . warfarin (COUMADIN) 5 MG tablet Take as directed by the coumadin clinic       No current facility-administered  medications for this visit.    Allergies:    Allergies  Allergen Reactions  . Penicillins Hives  . Uroxatral [Alfuzosin Hydrochloride] Other (See Comments)    hypotension    Social History:  The patient  reports that he quit smoking about 42 years ago. His smoking use included Cigarettes. He has a 30 pack-year smoking history. He has never used smokeless tobacco. He reports that he does not drink alcohol or use illicit drugs.   ROS:  Please see the history of present illness.   No transient neurological symptoms   All other systems reviewed and negative.   OBJECTIVE: VS:  BP 98/56  Pulse 70  Ht 5\' 5"  (1.651 m)  Wt 120 lb (54.432 kg)  BMI 19.97 kg/m2 Well nourished, well developed, in no acute distress, elderly and frail HEENT: normal Neck: JVD flat. Carotid bruit absent  Cardiac:  normal S1, S2; RRR; 3/6 holosystolic murmur of mitral regurgitation Lungs:  clear to auscultation bilaterally, no wheezing, rhonchi or rales Abd: soft, nontender, no hepatomegaly Ext: Edema absent. Pulses 2+ Skin: warm and dry Neuro:  CNs 2-12 intact, no focal abnormalities noted  EKG:  Atrial fibrillation with ventricular pacing       Signed, Illene Labrador III, MD 11/08/2013 10:55 AM

## 2013-11-08 NOTE — Patient Instructions (Signed)
Your physician recommends that you continue on your current medications as directed. Please refer to the Current Medication list given to you today.  Your physician wants you to follow-up in: 1 year. You will receive a reminder letter in the mail two months in advance. If you don't receive a letter, please call our office to schedule the follow-up appointment.  

## 2013-11-20 DIAGNOSIS — M79609 Pain in unspecified limb: Secondary | ICD-10-CM | POA: Diagnosis not present

## 2013-11-20 DIAGNOSIS — B351 Tinea unguium: Secondary | ICD-10-CM | POA: Diagnosis not present

## 2013-11-27 IMAGING — CR DG CHEST 2V
2 series · 2 of 2 positions shown · non-contrast
Comparison: CT chest 07/30/2011 and chest radiograph 07/29/2011.

CLINICAL DATA: Fever and weakness.

CHEST - 2 VIEW

[w chest pa]
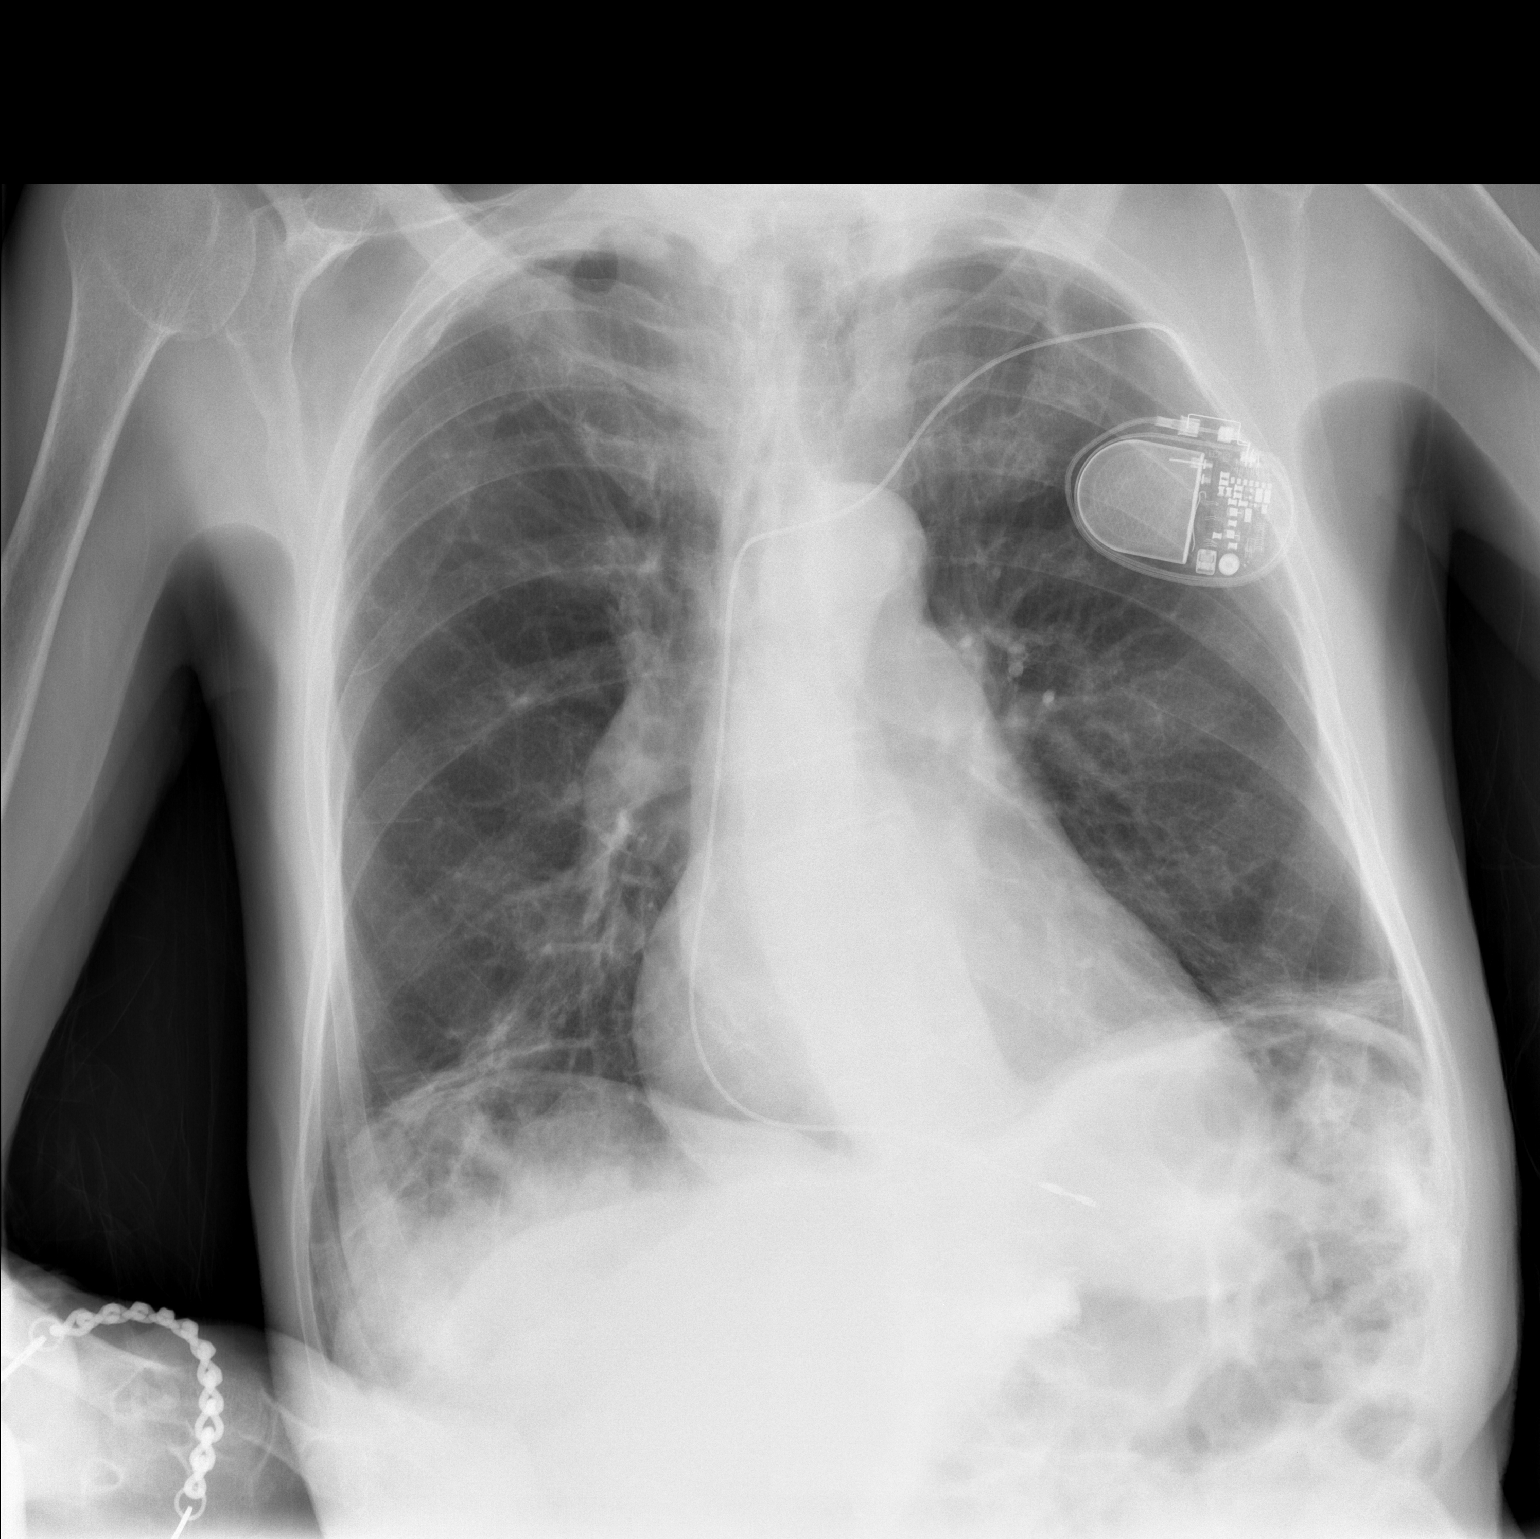

[w chest lat]
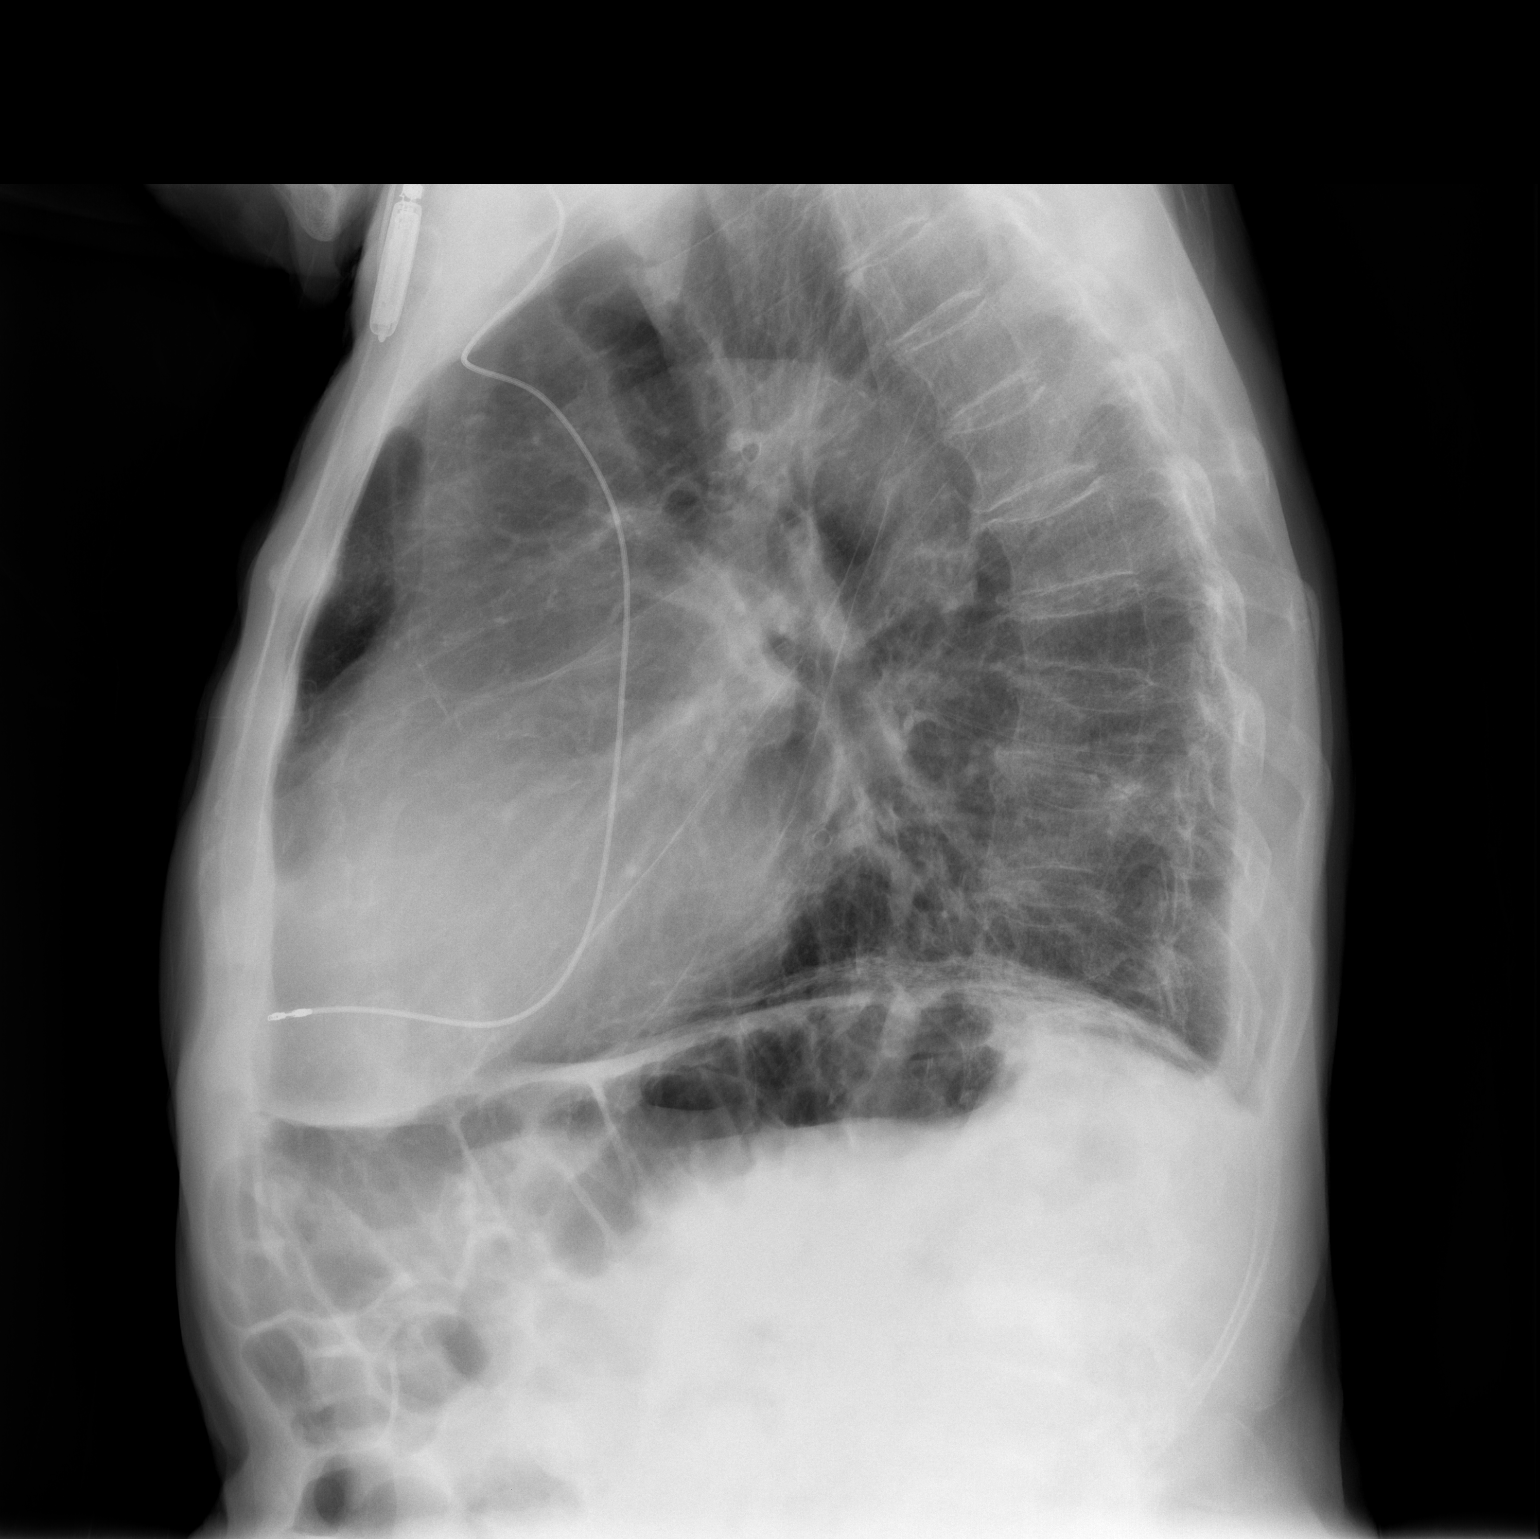

[2 of 2 positions shown; findings below may reference images not displayed]

FINDINGS: Trachea is midline.  Heart size stable.  Biapical pleural
parenchymal scarring.  Probable mild bibasilar scarring.  Lungs are
otherwise clear.  No pleural fluid.  Lower thoracic compression
fracture is unchanged.
IMPRESSION: Bibasilar scarring.  No acute findings.

## 2013-12-10 ENCOUNTER — Other Ambulatory Visit: Payer: Self-pay | Admitting: Physician Assistant

## 2013-12-10 ENCOUNTER — Ambulatory Visit
Admission: RE | Admit: 2013-12-10 | Discharge: 2013-12-10 | Disposition: A | Discharge status: Home / Self Care | Payer: Medicare Other | Source: Ambulatory Visit | Attending: Physician Assistant | Admitting: Physician Assistant

## 2013-12-10 DIAGNOSIS — R195 Other fecal abnormalities: Secondary | ICD-10-CM

## 2013-12-10 DIAGNOSIS — K5900 Constipation, unspecified: Secondary | ICD-10-CM | POA: Diagnosis not present

## 2013-12-31 ENCOUNTER — Encounter: Payer: Medicare Other | Admitting: *Deleted

## 2013-12-31 ENCOUNTER — Telehealth: Payer: Self-pay | Admitting: Cardiology

## 2013-12-31 NOTE — Telephone Encounter (Signed)
LMOVM reminding pt to send remote transmission.   

## 2014-01-01 ENCOUNTER — Encounter: Payer: Self-pay | Admitting: Cardiology

## 2014-03-27 ENCOUNTER — Encounter: Payer: Self-pay | Admitting: Cardiology

## 2014-04-03 ENCOUNTER — Ambulatory Visit (INDEPENDENT_AMBULATORY_CARE_PROVIDER_SITE_OTHER): Payer: Medicare Other | Admitting: *Deleted

## 2014-04-03 DIAGNOSIS — I442 Atrioventricular block, complete: Secondary | ICD-10-CM

## 2014-04-03 LAB — MDC_IDC_ENUM_SESS_TYPE_INCLINIC
Battery Remaining Longevity: 121.2 mo
Battery Voltage: 2.95 V
Date Time Interrogation Session: 20151021123409
Lead Channel Impedance Value: 450 Ohm
Lead Channel Pacing Threshold Amplitude: 1 V
Lead Channel Pacing Threshold Pulse Width: 0.5 ms
Lead Channel Setting Sensing Sensitivity: 2.5 mV
MDC IDC PG SERIAL: 2303264
MDC IDC SET LEADCHNL RV PACING AMPLITUDE: 1.25 V
MDC IDC SET LEADCHNL RV PACING PULSEWIDTH: 0.5 ms
MDC IDC STAT BRADY RV PERCENT PACED: 99.89 %

## 2014-04-03 NOTE — Progress Notes (Signed)
Pacemaker check in clinic. Normal device function. Thresholds, sensing, impedances consistent with previous measurements. Device programmed to maximize longevity. No high ventricular rates noted. Device programmed at appropriate safety margins. Histogram distribution appropriate for patient activity level. Device programmed to optimize intrinsic conduction. Estimated longevity 10.1 years. Patient enrolled in remote follow-up/TTM's with [employer]. Plan to follow every 3 months remotely and see annually in office. Patient education completed.  Merlin 07/04/14.

## 2014-04-11 ENCOUNTER — Encounter (HOSPITAL_COMMUNITY): Payer: Self-pay | Admitting: Pharmacy Technician

## 2014-04-15 ENCOUNTER — Telehealth: Payer: Self-pay

## 2014-04-15 ENCOUNTER — Telehealth: Payer: Self-pay | Admitting: Interventional Cardiology

## 2014-04-15 NOTE — Telephone Encounter (Signed)
Received request from Nurse fax box, documents faxed for surgical clearance. To: [facility] Fax number: [phone] Attention: 11.2.15/km

## 2014-04-15 NOTE — Telephone Encounter (Signed)
Sign medical clearance given to medical  records to fax to  Howie Ill attn: [place] fax [phone]

## 2014-04-22 ENCOUNTER — Encounter: Payer: Self-pay | Admitting: Internal Medicine

## 2014-04-23 ENCOUNTER — Encounter (HOSPITAL_COMMUNITY): Payer: Self-pay | Admitting: *Deleted

## 2014-04-23 ENCOUNTER — Other Ambulatory Visit: Payer: Self-pay | Admitting: Gastroenterology

## 2014-04-24 ENCOUNTER — Encounter (HOSPITAL_COMMUNITY)
Admission: RE | Disposition: A | Discharge status: Home / Self Care | Payer: Self-pay | Source: Ambulatory Visit | Attending: Gastroenterology

## 2014-04-24 ENCOUNTER — Ambulatory Visit (HOSPITAL_COMMUNITY): Payer: Medicare Other | Admitting: Anesthesiology

## 2014-04-24 ENCOUNTER — Encounter (HOSPITAL_COMMUNITY): Payer: Self-pay | Admitting: Anesthesiology

## 2014-04-24 ENCOUNTER — Ambulatory Visit (HOSPITAL_COMMUNITY)
Admission: RE | Admit: 2014-04-24 | Discharge: 2014-04-24 | Disposition: A | Discharge status: Home / Self Care | Payer: Medicare Other | Source: Ambulatory Visit | Attending: Gastroenterology | Admitting: Gastroenterology

## 2014-04-24 DIAGNOSIS — E039 Hypothyroidism, unspecified: Secondary | ICD-10-CM | POA: Insufficient documentation

## 2014-04-24 DIAGNOSIS — K208 Other esophagitis: Secondary | ICD-10-CM | POA: Diagnosis not present

## 2014-04-24 DIAGNOSIS — I080 Rheumatic disorders of both mitral and aortic valves: Secondary | ICD-10-CM | POA: Diagnosis not present

## 2014-04-24 DIAGNOSIS — K31819 Angiodysplasia of stomach and duodenum without bleeding: Secondary | ICD-10-CM | POA: Diagnosis not present

## 2014-04-24 DIAGNOSIS — I4891 Unspecified atrial fibrillation: Secondary | ICD-10-CM | POA: Insufficient documentation

## 2014-04-24 DIAGNOSIS — M1990 Unspecified osteoarthritis, unspecified site: Secondary | ICD-10-CM | POA: Diagnosis not present

## 2014-04-24 DIAGNOSIS — I509 Heart failure, unspecified: Secondary | ICD-10-CM | POA: Diagnosis not present

## 2014-04-24 DIAGNOSIS — Z7901 Long term (current) use of anticoagulants: Secondary | ICD-10-CM | POA: Diagnosis not present

## 2014-04-24 DIAGNOSIS — Z950 Presence of cardiac pacemaker: Secondary | ICD-10-CM | POA: Insufficient documentation

## 2014-04-24 DIAGNOSIS — K269 Duodenal ulcer, unspecified as acute or chronic, without hemorrhage or perforation: Secondary | ICD-10-CM | POA: Diagnosis not present

## 2014-04-24 DIAGNOSIS — D649 Anemia, unspecified: Secondary | ICD-10-CM | POA: Diagnosis not present

## 2014-04-24 DIAGNOSIS — Z87891 Personal history of nicotine dependence: Secondary | ICD-10-CM | POA: Diagnosis not present

## 2014-04-24 DIAGNOSIS — I10 Essential (primary) hypertension: Secondary | ICD-10-CM | POA: Insufficient documentation

## 2014-04-24 DIAGNOSIS — D509 Iron deficiency anemia, unspecified: Secondary | ICD-10-CM | POA: Diagnosis present

## 2014-04-24 DIAGNOSIS — R195 Other fecal abnormalities: Secondary | ICD-10-CM | POA: Diagnosis present

## 2014-04-24 HISTORY — PX: ESOPHAGOGASTRODUODENOSCOPY (EGD) WITH PROPOFOL: SHX5813

## 2014-04-24 SURGERY — ESOPHAGOGASTRODUODENOSCOPY (EGD) WITH PROPOFOL
Anesthesia: Monitor Anesthesia Care

## 2014-04-24 MED ORDER — LACTATED RINGERS IV SOLN
INTRAVENOUS | Status: DC | PRN
  Administered 2014-04-24: 13:00:00 via INTRAVENOUS

## 2014-04-24 MED ORDER — PROPOFOL 10 MG/ML IV BOLUS
INTRAVENOUS | Status: DC | PRN
  Administered 2014-04-24: 20 mg via INTRAVENOUS

## 2014-04-24 MED ORDER — BUTAMBEN-TETRACAINE-BENZOCAINE 2-2-14 % EX AERO
INHALATION_SPRAY | CUTANEOUS | Status: DC | PRN
  Administered 2014-04-24: 2 via TOPICAL

## 2014-04-24 MED ORDER — SODIUM CHLORIDE 0.9 % IV SOLN: INTRAVENOUS | Status: DC

## 2014-04-24 MED ORDER — PROPOFOL INFUSION 10 MG/ML OPTIME
INTRAVENOUS | Status: DC | PRN
  Administered 2014-04-24: 25 ug/kg/min via INTRAVENOUS

## 2014-04-24 NOTE — Anesthesia Preprocedure Evaluation (Addendum)
Anesthesia Evaluation  Patient identified by MRN, date of birth, ID band Patient awake    Reviewed: Allergy & Precautions, H&P , NPO status , Patient's Chart, lab work & pertinent test results  Airway Mallampati: III  TM Distance: >3 FB Neck ROM: Full    Dental no notable dental hx. (+) Teeth Intact, Dental Advisory Given   Pulmonary neg pulmonary ROS, former smoker,  breath sounds clear to auscultation  Pulmonary exam normal       Cardiovascular hypertension, +CHF + dysrhythmias Atrial Fibrillation + pacemaker + Valvular Problems/Murmurs MR and AS Rhythm:Regular Rate:Normal  EF 20-25% Moderate AS  Severe MR   Neuro/Psych negative neurological ROS  negative psych ROS   GI/Hepatic negative GI ROS, Neg liver ROS,   Endo/Other  Hypothyroidism   Renal/GU negative Renal ROS  negative genitourinary   Musculoskeletal  (+) Arthritis -,   Abdominal   Peds  Hematology negative hematology ROS (+) anemia ,   Anesthesia Other Findings   Reproductive/Obstetrics negative OB ROS                            Anesthesia Physical Anesthesia Plan  ASA: IV  Anesthesia Plan: MAC   Post-op Pain Management:    Induction: Intravenous  Airway Management Planned: Nasal Cannula  Additional Equipment:   Intra-op Plan:   Post-operative Plan:   Informed Consent: I have reviewed the patients History and Physical, chart, labs and discussed the procedure including the risks, benefits and alternatives for the proposed anesthesia with the patient or authorized representative who has indicated his/her understanding and acceptance.   Dental advisory given  Plan Discussed with: CRNA  Anesthesia Plan Comments:         Anesthesia Quick Evaluation

## 2014-04-24 NOTE — Addendum Note (Signed)
Addended by: Wilford Corner on: 04/24/2014 11:42 AM   Modules accepted: Orders

## 2014-04-24 NOTE — Op Note (Signed)
[facility] [place], [address]   ENDOSCOPY PROCEDURE REPORT  PATIENT: Edward, Gill  MR#: [id number] BIRTHDATE: January 13, 1923 , 89  yrs. old GENDER: male ENDOSCOPIST: Wilford Corner, MD REFERRED BY:  Carol Ada, M.D. PROCEDURE DATE:  04/24/2014 PROCEDURE:  EGD, diagnostic ASA CLASS:     Class IV INDICATIONS:  iron deficiency anemia and hemocult positive stool. MEDICATIONS: Per Anesthesia and Monitored anesthesia care TOPICAL ANESTHETIC:  DESCRIPTION OF PROCEDURE: After the risks benefits and alternatives of the procedure were thoroughly explained, informed consent was obtained.  The Pentax Gastroscope [id number] endoscope was introduced through the mouth and advanced to the second portion of the duodenum , Without limitations.  The instrument was slowly withdrawn as the mucosa was fully examined.    GEJ was 36 cm from the incisors and minimal erosive esophagitis noted at the GEJ. Esophagus was otherwise normal in appearance. The endoscope was advanced into the stomach, which revealed a few tiny erosions in the antrum. Body of the stomach was normal. Duodenal bulb was normal but upon entering the 2nd part of the duodenum there was a small amount of fresh blood noted without any identified source. After irrigation of this streak of blood two tiny AVMs were noted without any active bleeding. A small (4 mm) duodenal ulcer noted with a minimal amount of pigmented material seen in the ulcer bed. No active bleeding from the ulcer seen. Due to excessive looping and inability to adequately see the second portion of the duodenum the endoscope was removed and an enteroscope was inserted. The previously noted AVMs could not be found in the second portion of the duodenum.       Retroflexion revealed a normal proximal stomach.          The scope was then withdrawn from the patient and the procedure completed.  COMPLICATIONS: There were no  immediate complications.  ENDOSCOPIC IMPRESSION:     Two tiny duodenal AVMs (nonbleeding) Small nonbleeding duodenal ulcer Minimal antral erosions Minimal erosive esophagitis  RECOMMENDATIONS:     Start PPI (Prilosec 40 mg/day) Resume Coumadin at dose per cardiology recs Follow H/H   eSigned:  Wilford Corner, MD 04/24/2014 2:02 PM    [id number] Tamala Julian, MD  CPT CODES: ICD CODES:  The ICD and CPT codes recommended by this software are interpretations from the data that the clinical staff has captured with the software.  The verification of the translation of this report to the ICD and CPT codes and modifiers is the sole responsibility of the health care institution and practicing physician where this report was generated.  [employer] will not be held responsible for the validity of the ICD and CPT codes included on this report.  AMA assumes no liability for data contained or not contained herein. CPT is a Designer, television/film set of the [employer].  PATIENT NAME:  [name], [name] MR#: [id number]

## 2014-04-24 NOTE — Interval H&P Note (Signed)
History and Physical Interval Note:  04/24/2014 11:44 AM  [place]  has presented today for surgery, with the diagnosis of anemia  The various methods of treatment have been discussed with the patient and family. After consideration of risks, benefits and other options for treatment, the patient has consented to  Procedure(s): ESOPHAGOGASTRODUODENOSCOPY (EGD) WITH PROPOFOL (N/A) as a surgical intervention .  The patient's history has been reviewed, patient examined, no change in status, stable for surgery.  I have reviewed the patient's chart and labs.  Questions were answered to the patient's satisfaction.     [place] C.

## 2014-04-24 NOTE — Anesthesia Procedure Notes (Signed)
Procedure Name: MAC Date/Time: 04/24/2014 1:20 PM Performed by: Eligha Bridegroom Pre-anesthesia Checklist: Patient identified, Emergency Drugs available, Suction available, Patient being monitored and Timeout performed Patient Re-evaluated:Patient Re-evaluated prior to inductionOxygen Delivery Method: Nasal cannula Preoxygenation: Pre-oxygenation with 100% oxygen Intubation Type: IV induction

## 2014-04-24 NOTE — H&P (Signed)
  Date of Initial H&P: 04/08/14  History reviewed, patient examined, no change in status, stable for surgery.

## 2014-04-24 NOTE — Anesthesia Postprocedure Evaluation (Signed)
  Anesthesia Post-op Note  Patient: [name]  Procedure(s) Performed: Procedure(s): ESOPHAGOGASTRODUODENOSCOPY (EGD) WITH PROPOFOL (N/A)  Patient Location: PACU  Anesthesia Type: MAC  Level of Consciousness: awake and alert   Airway and Oxygen Therapy: Patient Spontanous Breathing  Post-op Pain: none  Post-op Assessment: Post-op Vital signs reviewed, Patient's Cardiovascular Status Stable and Respiratory Function Stable  Post-op Vital Signs: Reviewed  Filed Vitals:   04/24/14 1357  BP: 117/57  Pulse: 71  Temp:   Resp: 16    Complications: No apparent anesthesia complications

## 2014-04-24 NOTE — Transfer of Care (Signed)
Immediate Anesthesia Transfer of Care Note  Patient: [name]  Procedure(s) Performed: Procedure(s): ESOPHAGOGASTRODUODENOSCOPY (EGD) WITH PROPOFOL (N/A)  Patient Location: PACU and Endoscopy Unit  Anesthesia Type:MAC  Level of Consciousness: sedated and patient cooperative  Airway & Oxygen Therapy: Patient Spontanous Breathing and Patient connected to nasal cannula oxygen  Post-op Assessment: Report given to PACU RN and Post -op Vital signs reviewed and stable  Post vital signs: Reviewed and stable  Complications: No apparent anesthesia complications

## 2014-04-24 NOTE — Discharge Instructions (Addendum)
Start Prilosec (Omeprazole 40 mg once a day). Prescription will be called in for that medicine.   Esophagogastroduodenoscopy Care After Refer to this sheet in the next few weeks. These instructions provide you with information on caring for yourself after your procedure. Your caregiver may also give you more specific instructions. Your treatment has been planned according to current medical practices, but problems sometimes occur. Call your caregiver if you have any problems or questions after your procedure.  HOME CARE INSTRUCTIONS  Do not eat or drink anything until the numbing medicine (local anesthetic) has worn off and your gag reflex has returned. You will know that the local anesthetic has worn off when you can swallow comfortably.  Do not drive for 12 hours after the procedure or as directed by your caregiver.  Only take medicines as directed by your caregiver. SEEK MEDICAL CARE IF:   You cannot stop coughing.  You are not urinating at all or less than usual. SEEK IMMEDIATE MEDICAL CARE IF:  You have difficulty swallowing.  You cannot eat or drink.  You have worsening throat or chest pain.  You have dizziness, lightheadedness, or you faint.  You have nausea or vomiting.  You have chills.  You have a fever.  You have severe abdominal pain.  You have black, tarry, or bloody stools. Document Released: 05/17/2012 Document Reviewed: 05/17/2012 [facility] Patient Information 2015 [place]. This information is not intended to replace advice given to you by your health care provider. Make sure you discuss any questions you have with your health care provider.

## 2014-04-25 ENCOUNTER — Encounter (HOSPITAL_COMMUNITY): Payer: Self-pay | Admitting: Gastroenterology

## 2014-05-04 DIAGNOSIS — M79675 Pain in left toe(s): Secondary | ICD-10-CM | POA: Diagnosis not present

## 2014-05-04 DIAGNOSIS — B351 Tinea unguium: Secondary | ICD-10-CM | POA: Diagnosis not present

## 2014-05-04 DIAGNOSIS — M79674 Pain in right toe(s): Secondary | ICD-10-CM | POA: Diagnosis not present

## 2014-07-02 ENCOUNTER — Encounter: Payer: Self-pay | Admitting: Internal Medicine

## 2014-07-02 DIAGNOSIS — I442 Atrioventricular block, complete: Secondary | ICD-10-CM

## 2014-07-02 LAB — MDC_IDC_ENUM_SESS_TYPE_REMOTE
Battery Remaining Longevity: 103 mo
Battery Remaining Percentage: 74 %
Battery Voltage: 2.95 V
Brady Statistic RV Percent Paced: 99 %
Implantable Pulse Generator Model: 1110
Implantable Pulse Generator Serial Number: 2303264
Lead Channel Impedance Value: 410 Ohm
Lead Channel Sensing Intrinsic Amplitude: 12 mV
Lead Channel Setting Pacing Amplitude: 1.125
MDC IDC MSMT LEADCHNL RV PACING THRESHOLD AMPLITUDE: 0.875 V
MDC IDC MSMT LEADCHNL RV PACING THRESHOLD PULSEWIDTH: 0.5 ms
MDC IDC SESS DTM: 20160119174655
MDC IDC SET LEADCHNL RV PACING PULSEWIDTH: 0.5 ms
MDC IDC SET LEADCHNL RV SENSING SENSITIVITY: 2.5 mV

## 2014-07-04 ENCOUNTER — Ambulatory Visit (INDEPENDENT_AMBULATORY_CARE_PROVIDER_SITE_OTHER): Payer: 59 | Admitting: *Deleted

## 2014-07-04 DIAGNOSIS — I442 Atrioventricular block, complete: Secondary | ICD-10-CM

## 2014-07-04 NOTE — Progress Notes (Signed)
Remote pacemaker transmission.   

## 2014-07-09 ENCOUNTER — Encounter: Payer: Self-pay | Admitting: Cardiology

## 2014-07-18 ENCOUNTER — Encounter: Payer: Self-pay | Admitting: Physician Assistant

## 2014-07-18 ENCOUNTER — Ambulatory Visit
Admission: RE | Admit: 2014-07-18 | Discharge: 2014-07-18 | Disposition: A | Discharge status: Home / Self Care | Payer: Medicare Other | Source: Ambulatory Visit | Attending: Physician Assistant | Admitting: Physician Assistant

## 2014-07-18 ENCOUNTER — Ambulatory Visit (INDEPENDENT_AMBULATORY_CARE_PROVIDER_SITE_OTHER): Payer: Medicare Other | Admitting: Physician Assistant

## 2014-07-18 VITALS — BP 100/56 | HR 78 | Ht 65.0 in | Wt 120.0 lb

## 2014-07-18 DIAGNOSIS — I340 Nonrheumatic mitral (valve) insufficiency: Secondary | ICD-10-CM | POA: Insufficient documentation

## 2014-07-18 DIAGNOSIS — K922 Gastrointestinal hemorrhage, unspecified: Secondary | ICD-10-CM | POA: Insufficient documentation

## 2014-07-18 DIAGNOSIS — R0600 Dyspnea, unspecified: Secondary | ICD-10-CM

## 2014-07-18 DIAGNOSIS — G2581 Restless legs syndrome: Secondary | ICD-10-CM | POA: Insufficient documentation

## 2014-07-18 DIAGNOSIS — I351 Nonrheumatic aortic (valve) insufficiency: Secondary | ICD-10-CM

## 2014-07-18 DIAGNOSIS — H3530 Unspecified macular degeneration: Secondary | ICD-10-CM | POA: Insufficient documentation

## 2014-07-18 DIAGNOSIS — K5790 Diverticulosis of intestine, part unspecified, without perforation or abscess without bleeding: Secondary | ICD-10-CM | POA: Insufficient documentation

## 2014-07-18 DIAGNOSIS — K269 Duodenal ulcer, unspecified as acute or chronic, without hemorrhage or perforation: Secondary | ICD-10-CM | POA: Insufficient documentation

## 2014-07-18 DIAGNOSIS — I482 Chronic atrial fibrillation: Secondary | ICD-10-CM

## 2014-07-18 DIAGNOSIS — D649 Anemia, unspecified: Secondary | ICD-10-CM | POA: Insufficient documentation

## 2014-07-18 DIAGNOSIS — K209 Esophagitis, unspecified without bleeding: Secondary | ICD-10-CM | POA: Insufficient documentation

## 2014-07-18 DIAGNOSIS — R627 Adult failure to thrive: Secondary | ICD-10-CM

## 2014-07-18 DIAGNOSIS — K635 Polyp of colon: Secondary | ICD-10-CM | POA: Insufficient documentation

## 2014-07-18 DIAGNOSIS — I10 Essential (primary) hypertension: Secondary | ICD-10-CM

## 2014-07-18 DIAGNOSIS — I4892 Unspecified atrial flutter: Secondary | ICD-10-CM | POA: Insufficient documentation

## 2014-07-18 DIAGNOSIS — I5022 Chronic systolic (congestive) heart failure: Secondary | ICD-10-CM

## 2014-07-18 DIAGNOSIS — K5520 Angiodysplasia of colon without hemorrhage: Secondary | ICD-10-CM | POA: Insufficient documentation

## 2014-07-18 DIAGNOSIS — I350 Nonrheumatic aortic (valve) stenosis: Secondary | ICD-10-CM | POA: Insufficient documentation

## 2014-07-18 DIAGNOSIS — R269 Unspecified abnormalities of gait and mobility: Secondary | ICD-10-CM | POA: Insufficient documentation

## 2014-07-18 DIAGNOSIS — I071 Rheumatic tricuspid insufficiency: Secondary | ICD-10-CM

## 2014-07-18 DIAGNOSIS — E039 Hypothyroidism, unspecified: Secondary | ICD-10-CM | POA: Insufficient documentation

## 2014-07-18 DIAGNOSIS — Z950 Presence of cardiac pacemaker: Secondary | ICD-10-CM

## 2014-07-18 DIAGNOSIS — I4821 Permanent atrial fibrillation: Secondary | ICD-10-CM

## 2014-07-18 DIAGNOSIS — I5023 Acute on chronic systolic (congestive) heart failure: Secondary | ICD-10-CM

## 2014-07-18 DIAGNOSIS — I951 Orthostatic hypotension: Secondary | ICD-10-CM | POA: Insufficient documentation

## 2014-07-18 DIAGNOSIS — G629 Polyneuropathy, unspecified: Secondary | ICD-10-CM | POA: Insufficient documentation

## 2014-07-18 LAB — CBC WITH DIFFERENTIAL/PLATELET
Basophils Absolute: 0 10*3/uL (ref 0.0–0.1)
Basophils Relative: 0.7 % (ref 0.0–3.0)
Eosinophils Absolute: 0.3 10*3/uL (ref 0.0–0.7)
Eosinophils Relative: 5.4 % — ABNORMAL HIGH (ref 0.0–5.0)
HEMATOCRIT: 27.3 % — AB (ref 39.0–52.0)
Hemoglobin: 8.7 g/dL — ABNORMAL LOW (ref 13.0–17.0)
LYMPHS ABS: 1.1 10*3/uL (ref 0.7–4.0)
Lymphocytes Relative: 21.5 % (ref 12.0–46.0)
MCHC: 31.9 g/dL (ref 30.0–36.0)
MCV: 71.9 fl — AB (ref 78.0–100.0)
MONOS PCT: 13.1 % — AB (ref 3.0–12.0)
Monocytes Absolute: 0.7 10*3/uL (ref 0.1–1.0)
NEUTROS ABS: 3.1 10*3/uL (ref 1.4–7.7)
Neutrophils Relative %: 59.3 % (ref 43.0–77.0)
Platelets: 263 10*3/uL (ref 150.0–400.0)
RBC: 3.79 Mil/uL — ABNORMAL LOW (ref 4.22–5.81)
RDW: 19.8 % — ABNORMAL HIGH (ref 11.5–15.5)
WBC: 5.3 10*3/uL (ref 4.0–10.5)

## 2014-07-18 LAB — BASIC METABOLIC PANEL
BUN: 26 mg/dL — ABNORMAL HIGH (ref 6–23)
CO2: 23 meq/L (ref 19–32)
CREATININE: 0.84 mg/dL (ref 0.40–1.50)
Calcium: 8.9 mg/dL (ref 8.4–10.5)
Chloride: 106 mEq/L (ref 96–112)
GFR: 91.3 mL/min (ref >60.00)
Glucose, Bld: 146 mg/dL — ABNORMAL HIGH (ref 70–99)
POTASSIUM: 4 meq/L (ref 3.5–5.1)
Sodium: 137 mEq/L (ref 135–145)

## 2014-07-18 LAB — TSH: TSH: 3.61 u[IU]/mL (ref 0.35–4.50)

## 2014-07-18 LAB — BRAIN NATRIURETIC PEPTIDE: Pro B Natriuretic peptide (BNP): 500 pg/mL — ABNORMAL HIGH (ref 0.0–100.0)

## 2014-07-18 NOTE — Patient Instructions (Signed)
Your physician recommends that you continue on your current medications as directed. Please refer to the Current Medication list given to you today.  Labs Today: Bmet, Cbc, Tsh, Bnp  A chest x-ray takes a picture of the organs and structures inside the chest, including the heart, lungs, and blood vessels. This test can show several things, including, whether the heart is enlarges; whether fluid is building up in the lungs; and whether pacemaker / defibrillator leads are still in place.  Limit fluid intake from all sources to no more than 48 ounces per day  Your physician recommends that you schedule a follow-up appointment in: 1 month with Dr.Smith/or NP/PA

## 2014-07-18 NOTE — Progress Notes (Signed)
Cardiology Office Note  Date:  07/18/2014   Patient ID:  [name], [name] [birth year], MRN [id number]  PCP:  Reginia Naas, MD  Cardiologist:  Tamala Julian Electrophysiologist: Allred   Chief Complaint: leg swelling, possible fluid retention  History of Present Illness: [name] is a 81 y.o. male with complex PMH to include atrial flutter s/p DCCV, permanent atrial fibrillation s/p AVN ablation at [facility] with St. Jude pacemaker, chronic systolic CHF (EF 84-66% in 2013), valvular disease (mod-severe MR, mod TR/AS/AI - managed medically), severe peripheral neuropathy, gait disorder, poor mobility, memory issues, HTN, chronic pain, orthostatic hypotension, GI issues as outlined below who presents for follow-up. Last cardiac studies available include: - Nuc 2005: No evidence of pharmacologic-induced myocardial ischemia. Probable diaphragmatic attenuation artifact noted. 2. No evidence of regional left ventricular wall motion abnormality. Calculated left ventricular ejection fraction of 57%. - 2D Echo 12/2011: EF 20-25%, akinesis of mid-distal anteroseptal, anterior, and apical myocardium; consistent with infarction; moderate AS/AI, mod-severe MR, mod-severely dilated LA, mildly dilated RA, mod TR, PASP 66mmHg. Vegetation cannot be excluded. This was done at time of MSSA bacteremia. (Per notes, cardiology recommended no TEE due to increased risks.)  He is brought in by his caregiver/aide who works day shift with him in his home. She provides majority of the history as he is not particularly forthcoming with information. She is concerned for fluid retention. For the past 2 weeks she has noticed fluctuating LEE. She also has noticed a productive cough of phlegm. He tends to become SOB when using the urinal or watching TV. The patient himself denies chest pain. He has felt dizzy at times. No orthopnea, fevers, chills or syncope. He currently denies acute complaint. Last week he had an episode where  he did not urinate for 18 hours. The caregiver says his wife ("Miss Enid Derry") has since become concerned about dehydration thus has been encouraging increased water intake. No recent labs available. INR is followed by PCP, recently in range per caregiver. She says she is not sure what his weight has been recently because another nurse told her the other day it was 95 at home. He is 120 in the office today, up 10 lbs since November when evaluated by GI.  Past Medical History  Diagnosis Date  . Arthritis     Right knee  . Incontinence   . Hypothyroidism   . Restless leg syndrome   . Blood transfusion   . Anemia   . Lower GI bleeding 2011    a. 2005 note: slow gastrointestinal bleed, melena, and chronic anticoagulation, suspect peptic ulcer disease or erosive gastritis. b. GIB 2012.  Marland Kitchen Dementia   . Permanent atrial fibrillation     a. Prior DCCV/rhythm control unsuccessful. b. s/p AVN ablation at [facility] and subsequent SJM Pacemaker.  . Essential hypertension   . Orthostatic hypotension   . IBS (irritable bowel syndrome)   . Varicose veins     Many superficial veins along with peripheral edema  . Peripheral edema   . External hemorrhoid   . Internal hemorrhoid   . Chronic systolic CHF (congestive heart failure)     a. Echo 12/2011: EF 20-25%, akinesis of mid-distal anteroseptal, anterior, and apical myocardium; consistent with infarction; moderate AS/AI, mod-severe MR, mod-severely dilated LA, mildly dilated RA, mod TR, PASP 40mmHg.  . Corneal disease   . Pulmonary hypertension     Moderate  . Macular degeneration   . Back fracture     s/p kyphoplasty  .  Chronic pain   . Bowel obstruction 02/2010    x 4 days  . Pacemaker     a. s/p AV nodal ablation at [facility].  St Jude Pacemaker. Lead fracture in 2010 and required lead revision at [facility] at that time.  . AVM (arteriovenous malformation) of colon     a. EGD 04/2014: two tiny nonbleeding AVMs, small nonbleeding  duodenal ulcer, minimal antral erosions, minimal erosive esophagitis.  . Duodenal ulcer     a. EGD 04/2014: two tiny nonbleeding AVMs, small nonbleeding duodenal ulcer, minimal antral erosions, minimal erosive esophagitis.  . Esophagitis     a. EGD 04/2014: two tiny nonbleeding AVMs, small nonbleeding duodenal ulcer, minimal antral erosions, minimal erosive esophagitis.  . Aortic stenosis     a. Echo 12/2011: moderate AS.  Marland Kitchen Aortic regurgitation     a. Echo 12/2011: moderate AR.  Marland Kitchen Mitral regurgitation     a. Echo 12/2011: mod-severe MR.  . Tricuspid regurgitation     a. Echo 12/2011: moderate TR.  Marland Kitchen Atrial flutter     a. s/p DCCV 2007 (on propafenone remotely).  . Colon polyp   . Diverticulosis   . Gait abnormality   . Peripheral neuropathy   . Bacteremia 2013  . Failure to thrive (0-17)   . Hyponatremia     Past Surgical History  Procedure Laterality Date  . Pacemaker insertion  ~ 2006    initial placement  . Pacemaker revision  05/2009    RV lead revised due to lead fracture.  performed by Dr Koleen Nimrod at [facility]  . Knee arthroscopy  ~ 2000's    right  . Back surgery      "as a kid"  . Inguinal hernia repair  1960's    left  . Inguinal hernia repair  1973    right  . Av node ablation      at [facility]      Corneal, at [facility], both layers removed.  . Tonsillectomy    . Hemorrhoid surgery    . Esophagogastroduodenoscopy (egd) with propofol N/A 04/24/2014    Procedure: ESOPHAGOGASTRODUODENOSCOPY (EGD) WITH PROPOFOL;  Surgeon: Lear Ng, MD;  Location: [place];  Service: Endoscopy;  Laterality: N/A;    Current Outpatient Prescriptions  Medication Sig Dispense Refill  . Ascorbic Acid (VITAMIN C PO) Take 1 tablet by mouth daily.    Marland Kitchen CALCIUM PO Take 1 tablet by mouth daily.    . Diaper Rash Products (DESITIN) OINT Apply 1 application topically 3 (three) times daily as needed (rash).    . donepezil (ARICEPT) 5 MG tablet Take 1  tablet (5 mg total) by mouth at bedtime. 30 tablet 3  . levothyroxine (SYNTHROID, LEVOTHROID) 75 MCG tablet Take 75 mcg by mouth daily before breakfast.     . LORazepam (ATIVAN) 0.5 MG tablet Take 1 mg by mouth every 8 (eight) hours as needed. For anxiety    . morphine (MS CONTIN) 15 MG 12 hr tablet Take 15 mg by mouth every 12 (twelve) hours.     . Multiple Vitamins-Minerals (ICAPS MV PO) Take 1 capsule by mouth daily.    . Multiple Vitamins-Minerals (MULTIVITAMIN WITH MINERALS) tablet Take by mouth daily.    Marland Kitchen NAMENDA 10 MG tablet TAKE 1 TABLET BY MOUTH TWICE A DAY 30 tablet 0  . Omega-3 Fatty Acids (FISH OIL) 1000 MG CAPS Take 1 capsule by mouth daily.    Marland Kitchen omeprazole (PRILOSEC) 40 MG  capsule Take 40 mg by mouth daily.  3  . temazepam (RESTORIL) 15 MG capsule Take 1 capsule (15 mg total) by mouth at bedtime. 30 capsule 0  . VITAMIN D, CHOLECALCIFEROL, PO Take 1 tablet by mouth daily.    Marland Kitchen warfarin (COUMADIN) 5 MG tablet Take 5 mg by mouth daily at 6 PM.     No current facility-administered medications for this visit.    Allergies:   Contrast media; Penicillins; and Uroxatral   Social History:  The patient  reports that he quit smoking about 43 years ago. His smoking use included Cigarettes. He has a 30 pack-year smoking history. He has never used smokeless tobacco. He reports that he does not drink alcohol or use illicit drugs.   Family History:  The patient's family history includes Factor V Leiden deficiency in his daughter. He was adopted.  ROS:  Please see the history of present illness. Otherwise, review of systems is positive for cough. All other systems are reviewed and otherwise negative.   PHYSICAL EXAM:  VS:  BP 100/56 mmHg  Pulse 78  Ht 5\' 5"  (1.651 m)  Wt 120 lb (54.432 kg)  BMI 19.97 kg/m2  SpO2 95% BMI: Body mass index is 19.97 kg/(m^2). Frail appearing elderly WM in no acute distress HEENT: normocephalic, atraumatic Neck: no JVD Cardiac: RRR; 2/6 systolic murmur  heard through entire precordium, S2 is not clearly audible Lungs:  diminished throughout, no obvious wheezing, rhonchi or rales Abd: soft, nontender, no hepatomegaly Ext: no edema Skin: warm and dry Neuro:  moves all extremities spontaneously, no focal abnormalities noted  EKG:  V paced 78bpm - reviewed with Dr. Caryl Comes given lack of pacer spikes but he agrees we can infer paced given h/o AVN ablation. Morphology similar to all prior EKGs. Note pacer functioning normally 07/02/14 by remote check.  Recent Labs: No results found for requested labs within last 365 days.  No results found for requested labs within last 365 days.   CrCl cannot be calculated (Patient has no serum creatinine result on file.).   Wt Readings from Last 3 Encounters:  07/18/14 120 lb (54.432 kg)  04/24/14 110 lb (49.896 kg)  11/08/13 120 lb (54.432 kg)     Other studies reviewed: Additional studies/records reviewed today include: echo 12/2011, device interrogation in 03/2014.  ASSESSMENT AND PLAN:  1. Fluctuating dyspnea/LEE/cough, possibly due to acute on chronic systolic CHF. He is at risk for volume overload due to CHF and significant valvular disease in the setting of recent increased oral intake of fluid. However, we do not have any labs since 2013. Before adjusting meds, I feel we need more information. Will check labs and CXR today. Based on this information, might consider low dose diuretic therapy. Recommended to fluid restrict to 48oz fluids daily (1.5L). His BP will not support more aggressive measures. I do feel there is likely a component of failure to thrive as well, thus prognosis is guarded. He was recently started on mirtazepine by PCP for appetite stimulation. 2. Atrial flutter/permanent atrial fibrillation s/p AVN ablation and subsequent St. Jude pacemaker - primary care doctor follows Coumadin. Pacemaker is followed by Dr. Rayann Heman. Given h/o GIB will recheck CBC given no recent values  available. 3. Valvular heart disease to include mod-severe MR, mod AI/TR/AS - by history and exam I suspect disease has progressed. He does not appear to be a great candidate for surgical intervention and Dr. Tamala Julian has managed him medically to this point. Will hold off repeat  echocardiogram pending the above workup, as it may not change management at this time. 4. Essential hypertension - BP now running borderline low off all agents.  Disposition: F/u with Dr. Tamala Julian or APP in 1 month.  Current medicines are reviewed at length with the patient and his caregiver today.  The patient did not have any concerns regarding medicines.  Raechel Ache PA-C 07/18/2014 11:51 AM     [address] [place] [address] [phone] (office)  [phone] (fax)

## 2014-07-23 ENCOUNTER — Encounter: Payer: Self-pay | Admitting: Interventional Cardiology

## 2014-07-23 ENCOUNTER — Ambulatory Visit (INDEPENDENT_AMBULATORY_CARE_PROVIDER_SITE_OTHER): Payer: Medicare Other | Admitting: Interventional Cardiology

## 2014-07-23 VITALS — BP 108/64 | HR 76 | Ht 65.0 in | Wt 119.0 lb

## 2014-07-23 DIAGNOSIS — I482 Chronic atrial fibrillation: Secondary | ICD-10-CM

## 2014-07-23 DIAGNOSIS — I340 Nonrheumatic mitral (valve) insufficiency: Secondary | ICD-10-CM

## 2014-07-23 DIAGNOSIS — I10 Essential (primary) hypertension: Secondary | ICD-10-CM

## 2014-07-23 DIAGNOSIS — I4821 Permanent atrial fibrillation: Secondary | ICD-10-CM

## 2014-07-23 DIAGNOSIS — I5022 Chronic systolic (congestive) heart failure: Secondary | ICD-10-CM

## 2014-07-23 MED ORDER — FUROSEMIDE 20 MG PO TABS: 20.0000 mg | ORAL_TABLET | Freq: Every day | ORAL | Status: DC

## 2014-07-23 MED ORDER — LISINOPRIL 2.5 MG PO TABS: 2.5000 mg | ORAL_TABLET | Freq: Every day | ORAL | Status: DC

## 2014-07-23 NOTE — Patient Instructions (Addendum)
Your physician has recommended you make the following change in your medication:  1) START Lasix 20mg  daily. An Rx has been sent to your pharmacy 2) START Lisinopril 2.5mg  daily. An Rx has been sent to your pharmacy  Your physician recommends that you return for lab work on 08/06/14 ( [facility])  You have a follow up appointment on 08/06/14 @ 8:30am  Expect a call from [place], he would like to speak with you and your family about end of life issues

## 2014-07-23 NOTE — Progress Notes (Signed)
Patient ID: [name], male   DOB: 1922/10/17, 81 y.o.   MRN: [id number]    Cardiology Office Note   Date:  07/23/2014   ID:  [name], DOB Mar 08, 1923, MRN [id number]  PCP:  Edward Naas, MD  Cardiologist:   Edward Grooms, MD   No chief complaint on file.     History of Present Illness: [name] is a 81 y.o. male who presents for discussion and further management of acute on chronic systolic heart failure. It is because of severe mitral regurgitation is been present for many years. The patient is elderly and frail. I asked him to come in today to discuss end-of-life issues. I hope that the family what, but they are not available. He does not want advance therapies. He does seem to understand that he has a serious condition. He has not had syncope. Fluid restriction is lead to stabilization of weight. He has lost 2 pounds since flow restriction is been started.    Past Medical History  Diagnosis Date  . Arthritis     Right knee  . Incontinence   . Hypothyroidism   . Restless leg syndrome   . Blood transfusion   . Anemia   . Lower GI bleeding 2011    a. 2005 note: slow gastrointestinal bleed, melena, and chronic anticoagulation, suspect peptic ulcer disease or erosive gastritis. b. GIB 2012.  Marland Kitchen Dementia   . Permanent atrial fibrillation     a. Prior DCCV/rhythm control unsuccessful. b. s/p AVN ablation at [facility] and subsequent SJM Pacemaker.  . Essential hypertension   . Orthostatic hypotension   . IBS (irritable bowel syndrome)   . Varicose veins     Many superficial veins along with peripheral edema  . Peripheral edema   . External hemorrhoid   . Internal hemorrhoid   . Chronic systolic CHF (congestive heart failure)     a. Echo 12/2011: EF 20-25%, akinesis of mid-distal anteroseptal, anterior, and apical myocardium; consistent with infarction; moderate AS/AI, mod-severe MR, mod-severely dilated LA, mildly dilated RA, mod TR, PASP 68mmHg.  .  Corneal disease   . Pulmonary hypertension     Moderate  . Macular degeneration   . Back fracture     s/p kyphoplasty  . Chronic pain   . Bowel obstruction 02/2010    x 4 days  . Pacemaker     a. s/p AV nodal ablation at [facility].  St Jude Pacemaker. Lead fracture in 2010 and required lead revision at [facility] at that time.  . AVM (arteriovenous malformation) of colon     a. EGD 04/2014: two tiny nonbleeding AVMs, small nonbleeding duodenal ulcer, minimal antral erosions, minimal erosive esophagitis.  . Duodenal ulcer     a. EGD 04/2014: two tiny nonbleeding AVMs, small nonbleeding duodenal ulcer, minimal antral erosions, minimal erosive esophagitis.  . Esophagitis     a. EGD 04/2014: two tiny nonbleeding AVMs, small nonbleeding duodenal ulcer, minimal antral erosions, minimal erosive esophagitis.  . Aortic stenosis     a. Echo 12/2011: moderate AS.  Marland Kitchen Aortic regurgitation     a. Echo 12/2011: moderate AR.  Marland Kitchen Mitral regurgitation     a. Echo 12/2011: mod-severe MR.  . Tricuspid regurgitation     a. Echo 12/2011: moderate TR.  Marland Kitchen Atrial flutter     a. s/p DCCV 2007 (on propafenone remotely).  . Colon polyp   . Diverticulosis   . Gait abnormality   . Peripheral neuropathy   .  Bacteremia 2013  . Failure to thrive (0-17)   . Hyponatremia     Past Surgical History  Procedure Laterality Date  . Pacemaker insertion  ~ 2006    initial placement  . Pacemaker revision  05/2009    RV lead revised due to lead fracture.  performed by Dr Koleen Nimrod at [facility]  . Knee arthroscopy  ~ 2000's    right  . Back surgery      "as a kid"  . Inguinal hernia repair  1960's    left  . Inguinal hernia repair  1973    right  . Av node ablation      at [facility]      Corneal, at [facility], both layers removed.  . Tonsillectomy    . Hemorrhoid surgery    . Esophagogastroduodenoscopy (egd) with propofol N/A 04/24/2014    Procedure: ESOPHAGOGASTRODUODENOSCOPY  (EGD) WITH PROPOFOL;  Surgeon: Edward Ng, MD;  Location: [place];  Service: Endoscopy;  Laterality: N/A;     Current Outpatient Prescriptions  Medication Sig Dispense Refill  . Ascorbic Acid (VITAMIN C PO) Take 1 tablet by mouth daily.    Marland Kitchen CALCIUM PO Take 1 tablet by mouth daily.    . Diaper Rash Products (DESITIN) OINT Apply 1 application topically 3 (three) times daily as needed (rash).    . donepezil (ARICEPT) 5 MG tablet Take 1 tablet (5 mg total) by mouth at bedtime. 30 tablet 3  . levothyroxine (SYNTHROID, LEVOTHROID) 75 MCG tablet Take 75 mcg by mouth daily before breakfast.     . LORazepam (ATIVAN) 0.5 MG tablet Take 0.5 mg by mouth every evening.    . mirtazapine (REMERON) 15 MG tablet Take 15 mg by mouth at bedtime.    Marland Kitchen morphine (MS CONTIN) 15 MG 12 hr tablet Take 15 mg by mouth every 12 (twelve) hours.     . Multiple Vitamins-Minerals (ICAPS MV PO) Take 1 capsule by mouth daily.    . Multiple Vitamins-Minerals (MULTIVITAMIN WITH MINERALS) tablet Take by mouth daily.    Marland Kitchen NAMENDA 10 MG tablet TAKE 1 TABLET BY MOUTH TWICE A DAY 30 tablet 0  . Omega-3 Fatty Acids (FISH OIL) 1000 MG CAPS Take 1 capsule by mouth daily.    Marland Kitchen omeprazole (PRILOSEC) 40 MG capsule Take 40 mg by mouth daily.  3  . temazepam (RESTORIL) 15 MG capsule Take 1 capsule (15 mg total) by mouth at bedtime. 30 capsule 0  . VITAMIN D, CHOLECALCIFEROL, PO Take 1 tablet by mouth daily.    Marland Kitchen warfarin (COUMADIN) 5 MG tablet Take 5 mg by mouth daily at 6 PM.     No current facility-administered medications for this visit.    Allergies:   Contrast media; Penicillins; and Uroxatral    Social History:  The patient  reports that he quit smoking about 43 years ago. His smoking use included Cigarettes. He has a 30 pack-year smoking history. He has never used smokeless tobacco. He reports that he does not drink alcohol or use illicit drugs.   Family History:  The patient's family history includes Factor V  Leiden deficiency in his daughter. He was adopted.    ROS:  Please see the history of present illness.   Otherwise, review of systems are positive for tight and weight loss..   All other systems are reviewed and negative.    PHYSICAL EXAM: VS:  BP 108/64 mmHg  Pulse 76  Ht 5\' 5"  (1.651  m)  Wt 119 lb (53.978 kg)  BMI 19.80 kg/m2 , BMI Body mass index is 19.8 kg/(m^2). GEN: Well nourished, well developed, in no acute distress HEENT: normal Neck: no JVD, carotid bruits, or masses Cardiac: Irregularly irregular RR,  rubs, or gallops,no edema . 3/6 apical systolic murmur of mitral regurgitation Respiratory:  clear to auscultation bilaterally, normal work of breathing GI: soft, nontender, nondistended, + BS MS: no deformity or atrophy Skin: warm and dry, no rash Neuro:  Strength and sensation are intact Psych: euthymic mood, full affect   EKG:  EKG is not ordered today. The ekg ordered today demonstrates    Recent Labs: 07/18/2014: BUN 26*; Creatinine 0.84; Hemoglobin 8.7 Repeated and verified X2.*; Platelets 263.0; Potassium 4.0; Pro B Natriuretic peptide (BNP) 500.0*; Sodium 137; TSH 3.61    Lipid Panel No results found for: CHOL, TRIG, HDL, CHOLHDL, VLDL, LDLCALC, LDLDIRECT    Wt Readings from Last 3 Encounters:  07/23/14 119 lb (53.978 kg)  07/18/14 120 lb (54.432 kg)  04/24/14 110 lb (49.896 kg)      Other studies Reviewed: Additional studies/ records that were reviewed today include: Echocardiogram and recent laboratory data. Review of the above records demonstrates: Severe systolic heart failure   ASSESSMENT AND PLAN:  1. Severe systolic heart failure, relatively recent in onset, with functional class III status. Likely related to chronic volume overload from mitral regurgitation. I talked to the patient about the nitroglycerin history of systolic heart failure. We discussed end-of-life issues. He prefers to be a DO NOT RESUSCITATE. He will also be appreciative of  hospice when the time comes. 2. Known severe mitral regurgitation 3. Elderly and frail 4. Chronic atrial fibrillation 5. Chronic anticoagulation 5. Severe anemia   Current medicines are reviewed at length with the patient today.  The patient does not have concerns regarding medicines.  The following changes have been made:  Lisinopril 2.5 mg per day and furosemide 20 mg per day. These medications are added and I have also spoken by phone with the patient's wife and reconfirmed the decision for DO NOT RESUSCITATE and get hospice involved at the appropriate time.  Labs/ tests ordered today include: Basic metabolic panel in 2 weeks  No orders of the defined types were placed in this encounter.     Disposition:   FU with his. Tj Kitchings in 2 weeks   Signed, Edward Grooms, MD  07/23/2014 11:23 AM    [address], [place], [place]  [address] Phone: [phone]; Fax: [phone]

## 2014-07-24 ENCOUNTER — Telehealth: Payer: Self-pay | Admitting: Interventional Cardiology

## 2014-07-24 NOTE — Telephone Encounter (Signed)
I spoke with Dr. Carol Ada withinthe patient's hemoglobin is now 7.9. She will start iron. With diuresis there will be some hemoconcentration. Please arrange for the patient have a repeat CBC in one week. This can be drawn at home by a visiting nurse if possible we will need shared the results with Dr. Carol Ada.  Secondly, we need to prepare a DO NOT RESUSCITATE outpatient document and have it available for the patient on his next visit.  Thirdly, we need to arrange for outpatient/in-home Hospice evaluation.

## 2014-07-29 NOTE — Telephone Encounter (Signed)
Spoke with [facility] home health service, they do not draw at home labs.  Ref to [facility] care and Hospice initiated. Spoke with Tommi Rumps he will contact pt and pt's family today. Given verbal order to draw a CBC next week at the pt home.

## 2014-07-29 NOTE — Telephone Encounter (Signed)
Pt daughter aware DNR form completed and signed by Dr.Smith. It will be left at the front desk for pick up

## 2014-07-31 DIAGNOSIS — I509 Heart failure, unspecified: Secondary | ICD-10-CM | POA: Diagnosis not present

## 2014-07-31 NOTE — Telephone Encounter (Signed)
Confirmed with [facility] pt is being admitted to service today. Verbal order given to draw CBC next wk at pt home and fax results

## 2014-08-01 ENCOUNTER — Telehealth: Payer: Self-pay | Admitting: Interventional Cardiology

## 2014-08-01 NOTE — Telephone Encounter (Signed)
New Message     Deidre from [facility] care and hospice is calling to let the Dr know that the patient is admitted under hospice services.  Please call if you have any questions.

## 2014-08-02 DIAGNOSIS — I509 Heart failure, unspecified: Secondary | ICD-10-CM | POA: Diagnosis not present

## 2014-08-05 DIAGNOSIS — I509 Heart failure, unspecified: Secondary | ICD-10-CM | POA: Diagnosis not present

## 2014-08-06 ENCOUNTER — Telehealth: Payer: Self-pay | Admitting: Interventional Cardiology

## 2014-08-06 ENCOUNTER — Ambulatory Visit: Payer: Medicare Other | Admitting: Interventional Cardiology

## 2014-08-06 DIAGNOSIS — I509 Heart failure, unspecified: Secondary | ICD-10-CM | POA: Diagnosis not present

## 2014-08-06 NOTE — Telephone Encounter (Signed)
Message routed to [place] for adv

## 2014-08-06 NOTE — Telephone Encounter (Signed)
Pt's home health nurse, Collie Siad, calling to let us know pt has a sore behind his ear, family member noted puss and applied abx ointment, although no puss seen at home health visit, also pt on fluid restriction of 48 oz, wants to drink an 8 oz boost in addition if this is agreeable with Dr. Tamala Julian, pls call family at [phone] and [place] [phone]

## 2014-08-07 NOTE — Telephone Encounter (Signed)
Okay to use boost. Let PCP know about sore behind ear. They should call that in and Home Health nurse needs to know that as well. Call us for cardiac concerns.

## 2014-08-09 DIAGNOSIS — I509 Heart failure, unspecified: Secondary | ICD-10-CM | POA: Diagnosis not present

## 2014-08-12 ENCOUNTER — Encounter: Payer: Self-pay | Admitting: Interventional Cardiology

## 2014-08-12 NOTE — Telephone Encounter (Signed)
[name] pt hospice nurse aware of Dr.Smith's response. She has already gotten an answer from pt pcp Dr.Candace Smith. Ok for pt to have boost and they were instructed to watch the sore behind the pt's ear

## 2014-08-13 ENCOUNTER — Encounter: Payer: Self-pay | Admitting: Interventional Cardiology

## 2014-08-13 ENCOUNTER — Ambulatory Visit: Payer: Medicare Other | Admitting: Physician Assistant

## 2014-08-13 DIAGNOSIS — I509 Heart failure, unspecified: Secondary | ICD-10-CM | POA: Diagnosis not present

## 2014-08-15 ENCOUNTER — Telehealth: Payer: Self-pay

## 2014-08-15 DIAGNOSIS — I509 Heart failure, unspecified: Secondary | ICD-10-CM | POA: Diagnosis not present

## 2014-08-19 NOTE — Telephone Encounter (Signed)
error 

## 2014-08-20 DIAGNOSIS — I509 Heart failure, unspecified: Secondary | ICD-10-CM | POA: Diagnosis not present

## 2014-08-27 DIAGNOSIS — I509 Heart failure, unspecified: Secondary | ICD-10-CM | POA: Diagnosis not present

## 2014-08-28 ENCOUNTER — Other Ambulatory Visit: Payer: Self-pay

## 2014-08-28 DIAGNOSIS — I5022 Chronic systolic (congestive) heart failure: Secondary | ICD-10-CM

## 2014-08-28 MED ORDER — FUROSEMIDE 20 MG PO TABS: 20.0000 mg | ORAL_TABLET | Freq: Every day | ORAL | Status: DC

## 2014-08-28 MED ORDER — LISINOPRIL 2.5 MG PO TABS: 2.5000 mg | ORAL_TABLET | Freq: Every day | ORAL | Status: DC

## 2014-09-02 DIAGNOSIS — I509 Heart failure, unspecified: Secondary | ICD-10-CM | POA: Diagnosis not present

## 2014-09-11 DIAGNOSIS — I509 Heart failure, unspecified: Secondary | ICD-10-CM | POA: Diagnosis not present

## 2014-09-12 DIAGNOSIS — I509 Heart failure, unspecified: Secondary | ICD-10-CM | POA: Diagnosis not present

## 2014-09-13 DIAGNOSIS — I509 Heart failure, unspecified: Secondary | ICD-10-CM | POA: Diagnosis not present

## 2014-09-16 DIAGNOSIS — I509 Heart failure, unspecified: Secondary | ICD-10-CM | POA: Diagnosis not present

## 2014-09-25 DIAGNOSIS — I509 Heart failure, unspecified: Secondary | ICD-10-CM | POA: Diagnosis not present

## 2014-09-29 DIAGNOSIS — I509 Heart failure, unspecified: Secondary | ICD-10-CM | POA: Diagnosis not present

## 2014-10-01 DIAGNOSIS — I509 Heart failure, unspecified: Secondary | ICD-10-CM | POA: Diagnosis not present

## 2014-10-03 DIAGNOSIS — B351 Tinea unguium: Secondary | ICD-10-CM | POA: Diagnosis not present

## 2014-10-03 DIAGNOSIS — M79674 Pain in right toe(s): Secondary | ICD-10-CM | POA: Diagnosis not present

## 2014-10-03 DIAGNOSIS — M79675 Pain in left toe(s): Secondary | ICD-10-CM | POA: Diagnosis not present

## 2014-10-03 DIAGNOSIS — I509 Heart failure, unspecified: Secondary | ICD-10-CM | POA: Diagnosis not present

## 2014-10-08 DIAGNOSIS — I509 Heart failure, unspecified: Secondary | ICD-10-CM | POA: Diagnosis not present

## 2014-10-13 DIAGNOSIS — I509 Heart failure, unspecified: Secondary | ICD-10-CM | POA: Diagnosis not present

## 2014-10-14 ENCOUNTER — Encounter: Payer: Medicare Other | Admitting: Internal Medicine

## 2014-10-15 DIAGNOSIS — I509 Heart failure, unspecified: Secondary | ICD-10-CM | POA: Diagnosis not present

## 2014-10-23 DIAGNOSIS — I509 Heart failure, unspecified: Secondary | ICD-10-CM | POA: Diagnosis not present

## 2014-10-28 ENCOUNTER — Encounter: Payer: Self-pay | Admitting: Internal Medicine

## 2014-10-28 ENCOUNTER — Ambulatory Visit (INDEPENDENT_AMBULATORY_CARE_PROVIDER_SITE_OTHER): Payer: Medicare Other | Admitting: Internal Medicine

## 2014-10-28 VITALS — BP 84/62 | HR 75 | Ht 66.0 in

## 2014-10-28 DIAGNOSIS — I482 Chronic atrial fibrillation: Secondary | ICD-10-CM

## 2014-10-28 DIAGNOSIS — I4821 Permanent atrial fibrillation: Secondary | ICD-10-CM

## 2014-10-28 DIAGNOSIS — Z950 Presence of cardiac pacemaker: Secondary | ICD-10-CM

## 2014-10-28 DIAGNOSIS — I442 Atrioventricular block, complete: Secondary | ICD-10-CM | POA: Diagnosis not present

## 2014-10-28 LAB — CUP PACEART INCLINIC DEVICE CHECK
Battery Voltage: 2.93 V
Date Time Interrogation Session: 20160516113749
Lead Channel Impedance Value: 412.5 Ohm
Lead Channel Pacing Threshold Amplitude: 1 V
Lead Channel Sensing Intrinsic Amplitude: 12 mV
Lead Channel Setting Pacing Amplitude: 1.25 V
Lead Channel Setting Pacing Pulse Width: 0.5 ms
Lead Channel Setting Sensing Sensitivity: 2.5 mV
MDC IDC MSMT BATTERY REMAINING LONGEVITY: 105.6 mo
MDC IDC MSMT LEADCHNL RV PACING THRESHOLD PULSEWIDTH: 0.5 ms
MDC IDC STAT BRADY RV PERCENT PACED: 99.88 %
Pulse Gen Model: 1110
Pulse Gen Serial Number: 2303264

## 2014-10-28 NOTE — Patient Instructions (Signed)
Medication Instructions:  Your physician recommends that you continue on your current medications as directed. Please refer to the Current Medication list given to you today.  Labwork: NONE  Testing/Procedures: NONE  Follow-Up: Remote monitoring is used to monitor your Pacemaker or ICD from home. This monitoring reduces the number of office visits required to check your device to one time per year. It allows [place] to keep an eye on the functioning of your device to ensure it is working properly. You are scheduled for a device check from home on 01/27/2015. You may send your transmission at any time that day. If you have a wireless device, the transmission will be sent automatically. After your physician reviews your transmission, you will receive a postcard with your next transmission date.  Your physician wants you to follow-up in: 12 months with Chanetta Marshall, NP. You will receive a reminder letter in the mail two months in advance. If you don't receive a letter, please call our office to schedule the follow-up appointment.   Any Other Special Instructions Will Be Listed Below (If Applicable).

## 2014-10-28 NOTE — Progress Notes (Signed)
Electrophysiology Office Note   Date:  10/28/2014   ID:  [name], DOB January 22, 1923, MRN [id number]  PCP:  Edward Naas, MD  Cardiologist:  Dr Tamala Julian Primary Electrophysiologist: Edward Grayer, MD    Chief Complaint  Patient presents with  . Atrial Fibrillation     History of Present Illness: [name] is a 81 y.o. male who presents today for electrophysiology evaluation.  Retired English as a second language teacher.  Enjoyed travel (most favorite place was [place] though he has been as far as [place]).   Doing well at this time.  Says he feels "fine".  Daughter notes that appetite is poor.  Today, he denies symptoms of palpitations, chest pain, shortness of breath, orthopnea, PND, claudication, dizziness, presyncope, syncope, bleeding, or neurologic sequela. The patient is tolerating medications without difficulties and is otherwise without complaint today.    Past Medical History  Diagnosis Date  . Arthritis     Right knee  . Incontinence   . Hypothyroidism   . Restless leg syndrome   . Blood transfusion   . Anemia   . Lower GI bleeding 2011    a. 2005 note: slow gastrointestinal bleed, melena, and chronic anticoagulation, suspect peptic ulcer disease or erosive gastritis. b. GIB 2012.  Marland Kitchen Dementia   . Permanent atrial fibrillation     a. Prior DCCV/rhythm control unsuccessful. b. s/p AVN ablation at [facility] and subsequent SJM Pacemaker.  . Essential hypertension   . Orthostatic hypotension   . IBS (irritable bowel syndrome)   . Varicose veins     Many superficial veins along with peripheral edema  . Peripheral edema   . External hemorrhoid   . Internal hemorrhoid   . Chronic systolic CHF (congestive heart failure)     a. Echo 12/2011: EF 20-25%, akinesis of mid-distal anteroseptal, anterior, and apical myocardium; consistent with infarction; moderate AS/AI, mod-severe MR, mod-severely dilated LA, mildly dilated RA, mod TR, PASP 25mmHg.  . Corneal disease   . Pulmonary  hypertension     Moderate  . Macular degeneration   . Back fracture     s/p kyphoplasty  . Chronic pain   . Bowel obstruction 02/2010    x 4 days  . Pacemaker     a. s/p AV nodal ablation at [facility].  St Jude Pacemaker. Lead fracture in 2010 and required lead revision at [facility] at that time.  . AVM (arteriovenous malformation) of colon     a. EGD 04/2014: two tiny nonbleeding AVMs, small nonbleeding duodenal ulcer, minimal antral erosions, minimal erosive esophagitis.  . Duodenal ulcer     a. EGD 04/2014: two tiny nonbleeding AVMs, small nonbleeding duodenal ulcer, minimal antral erosions, minimal erosive esophagitis.  . Esophagitis     a. EGD 04/2014: two tiny nonbleeding AVMs, small nonbleeding duodenal ulcer, minimal antral erosions, minimal erosive esophagitis.  . Aortic stenosis     a. Echo 12/2011: moderate AS.  Marland Kitchen Aortic regurgitation     a. Echo 12/2011: moderate AR.  Marland Kitchen Mitral regurgitation     a. Echo 12/2011: mod-severe MR.  . Tricuspid regurgitation     a. Echo 12/2011: moderate TR.  Marland Kitchen Atrial flutter     a. s/p DCCV 2007 (on propafenone remotely).  . Colon polyp   . Diverticulosis   . Gait abnormality   . Peripheral neuropathy   . Bacteremia 2013  . Failure to thrive (0-17)   . Hyponatremia    Past Surgical History  Procedure Laterality Date  . Pacemaker  insertion  ~ 2006    initial placement  . Pacemaker revision  05/2009    RV lead revised due to lead fracture.  performed by Dr Koleen Nimrod at [facility]  . Knee arthroscopy  ~ 2000's    right  . Back surgery      "as a kid"  . Inguinal hernia repair  1960's    left  . Inguinal hernia repair  1973    right  . Av node ablation      at [facility]      Corneal, at [facility], both layers removed.  . Tonsillectomy    . Hemorrhoid surgery    . Esophagogastroduodenoscopy (egd) with propofol N/A 04/24/2014    Procedure: ESOPHAGOGASTRODUODENOSCOPY (EGD) WITH PROPOFOL;  Surgeon:  Lear Ng, MD;  Location: [place];  Service: Endoscopy;  Laterality: N/A;     Current Outpatient Prescriptions  Medication Sig Dispense Refill  . Diaper Rash Products (DESITIN) OINT Apply 1 application topically 3 (three) times daily as needed (rash).    . donepezil (ARICEPT) 5 MG tablet Take 1 tablet (5 mg total) by mouth at bedtime. 30 tablet 3  . ferrous gluconate (FERGON) 324 MG tablet Take 1 tablet by mouth twice daily as directed  3  . furosemide (LASIX) 20 MG tablet Take 1 tablet (20 mg total) by mouth daily. 30 tablet 3  . levothyroxine (SYNTHROID, LEVOTHROID) 75 MCG tablet Take 75 mcg by mouth daily before breakfast.     . lisinopril (PRINIVIL,ZESTRIL) 2.5 MG tablet Take 1 tablet (2.5 mg total) by mouth daily. 30 tablet 3  . LORazepam (ATIVAN) 0.5 MG tablet Take 0.5 mg by mouth every evening.    . mirtazapine (REMERON) 15 MG tablet Take 15 mg by mouth at bedtime.    Marland Kitchen morphine (MS CONTIN) 15 MG 12 hr tablet Take 15 mg by mouth every 12 (twelve) hours.     Marland Kitchen NAMENDA 10 MG tablet TAKE 1 TABLET BY MOUTH TWICE A DAY 30 tablet 0  . Nutritional Supplements (BOOST PO) Take 1 Container by mouth daily.    Marland Kitchen omeprazole (PRILOSEC) 40 MG capsule Take 40 mg by mouth daily.  3  . temazepam (RESTORIL) 15 MG capsule Take 1 capsule (15 mg total) by mouth at bedtime. 30 capsule 0  . warfarin (COUMADIN) 5 MG tablet Take 5 mg by mouth daily at 6 PM.     No current facility-administered medications for this visit.    Allergies:   Contrast media; Penicillins; and Uroxatral   Social History:  The patient  reports that he quit smoking about 43 years ago. His smoking use included Cigarettes. He has a 30 pack-year smoking history. He has never used smokeless tobacco. He reports that he does not drink alcohol or use illicit drugs.   Family History:  The patient's family history includes Factor V Leiden deficiency in his daughter. He was adopted.    ROS:  Please see the history of present  illness.   All other systems are reviewed and negative.    PHYSICAL EXAM: VS:  BP 84/62 mmHg  Pulse 75  Ht 5\' 6"  (1.676 m) , BMI There is no weight on file to calculate BMI. GEN: elderly, pleasant, in no acute distress HEENT: normal Neck: no JVD, carotid bruits, or masses Cardiac: RRR (paced), 2/6 holosystolic murmur at the apex, + edema Respiratory:  clear to auscultation bilaterally, normal work of breathing GI: soft, nontender, nondistended, + BS MS: diffuse  atrophy Skin: warm and dry, device pocket is well healed Neuro:  Strength and sensation are intact Psych: euthymic mood, full affect  Device interrogation is reviewed today in detail.  See PaceArt for details.   Recent Labs: 07/18/2014: BUN 26*; Creatinine 0.84; Hemoglobin 8.7 Repeated and verified X2.*; Platelets 263.0; Potassium 4.0; Pro B Natriuretic peptide (BNP) 500.0*; Sodium 137; TSH 3.61    Lipid Panel  No results found for: CHOL, TRIG, HDL, CHOLHDL, VLDL, LDLCALC, LDLDIRECT   Wt Readings from Last 3 Encounters:  07/23/14 119 lb (53.978 kg)  07/18/14 120 lb (54.432 kg)  04/24/14 110 lb (49.896 kg)      Other studies Reviewed: Additional studies/ records that were reviewed today include: Dr Darliss Ridgel notes    ASSESSMENT AND PLAN:   1. Complete heart block s/p AV nodal ablation Normal pacemaker function See Pace Art report No changes today  2. Permanent afib Rate controlled On coumadin  3. Chronic systolic dysfunction Given comorbidities, decreased mobility, and lack of CV symptoms, a conservative approach is advised  DNI/DNR is confirmed with patient and family today  merlin Return to see Chanetta Marshall NP in 1 year Follow-up with Dr Tamala Julian as scheduled.  Current medicines are reviewed at length with the patient today.   The patient does not have concerns regarding his medicines.  The following changes were made today:  none  Labs/ tests ordered today include:  Orders Placed This Encounter    Procedures  . Implantable device check    Signed, Edward Grayer, MD  10/28/2014 11:52 AM     [facility] [address] [place] [address] [phone] (office) [phone] (fax)

## 2014-10-30 DIAGNOSIS — I509 Heart failure, unspecified: Secondary | ICD-10-CM | POA: Diagnosis not present

## 2014-11-01 ENCOUNTER — Encounter: Payer: Self-pay | Admitting: Internal Medicine

## 2014-11-04 ENCOUNTER — Telehealth: Payer: Self-pay | Admitting: Interventional Cardiology

## 2014-11-04 NOTE — Telephone Encounter (Signed)
Pt received Jury Duty summons-pt in Hospice -needs a letter stating pt not in condition to serve-pls mail letter to pt

## 2014-11-05 ENCOUNTER — Telehealth: Payer: Self-pay | Admitting: Internal Medicine

## 2014-11-05 DIAGNOSIS — I509 Heart failure, unspecified: Secondary | ICD-10-CM | POA: Diagnosis not present

## 2014-11-05 NOTE — Telephone Encounter (Signed)
[name] wanted to see if she can massage pt's feet. Please call back and discuss.

## 2014-11-05 NOTE — Telephone Encounter (Signed)
Pt should not serve for jury duty. Letter completed and mailed to pt

## 2014-11-07 NOTE — Telephone Encounter (Signed)
Returned call to Anne Fu for [facility]. lmom Dr.Smith will be out of the office until early June. She could call pt pcp [place] office for a verbal order For pt to have his feet massage. She should call back if further assistance is needed

## 2014-11-13 ENCOUNTER — Emergency Department (HOSPITAL_COMMUNITY)
Admission: EM | Admit: 2014-11-13 | Discharge: 2014-11-14 | Disposition: A | Discharge status: Home / Self Care | Payer: Medicare Other | Attending: Emergency Medicine | Admitting: Emergency Medicine

## 2014-11-13 ENCOUNTER — Emergency Department (HOSPITAL_COMMUNITY): Payer: Medicare Other

## 2014-11-13 DIAGNOSIS — G8929 Other chronic pain: Secondary | ICD-10-CM | POA: Diagnosis not present

## 2014-11-13 DIAGNOSIS — F0390 Unspecified dementia without behavioral disturbance: Secondary | ICD-10-CM | POA: Insufficient documentation

## 2014-11-13 DIAGNOSIS — I959 Hypotension, unspecified: Secondary | ICD-10-CM | POA: Insufficient documentation

## 2014-11-13 DIAGNOSIS — M1990 Unspecified osteoarthritis, unspecified site: Secondary | ICD-10-CM | POA: Diagnosis not present

## 2014-11-13 DIAGNOSIS — Z8719 Personal history of other diseases of the digestive system: Secondary | ICD-10-CM | POA: Insufficient documentation

## 2014-11-13 DIAGNOSIS — E039 Hypothyroidism, unspecified: Secondary | ICD-10-CM | POA: Insufficient documentation

## 2014-11-13 DIAGNOSIS — Z515 Encounter for palliative care: Secondary | ICD-10-CM

## 2014-11-13 DIAGNOSIS — Z79899 Other long term (current) drug therapy: Secondary | ICD-10-CM | POA: Insufficient documentation

## 2014-11-13 DIAGNOSIS — I509 Heart failure, unspecified: Secondary | ICD-10-CM | POA: Diagnosis not present

## 2014-11-13 DIAGNOSIS — Z86010 Personal history of colonic polyps: Secondary | ICD-10-CM | POA: Insufficient documentation

## 2014-11-13 DIAGNOSIS — Z87891 Personal history of nicotine dependence: Secondary | ICD-10-CM | POA: Insufficient documentation

## 2014-11-13 DIAGNOSIS — R509 Fever, unspecified: Secondary | ICD-10-CM

## 2014-11-13 DIAGNOSIS — D649 Anemia, unspecified: Secondary | ICD-10-CM | POA: Diagnosis not present

## 2014-11-13 DIAGNOSIS — J90 Pleural effusion, not elsewhere classified: Secondary | ICD-10-CM | POA: Diagnosis not present

## 2014-11-13 DIAGNOSIS — I10 Essential (primary) hypertension: Secondary | ICD-10-CM | POA: Insufficient documentation

## 2014-11-13 DIAGNOSIS — Z791 Long term (current) use of non-steroidal anti-inflammatories (NSAID): Secondary | ICD-10-CM | POA: Diagnosis not present

## 2014-11-13 DIAGNOSIS — Z950 Presence of cardiac pacemaker: Secondary | ICD-10-CM | POA: Insufficient documentation

## 2014-11-13 DIAGNOSIS — I5022 Chronic systolic (congestive) heart failure: Secondary | ICD-10-CM | POA: Insufficient documentation

## 2014-11-13 DIAGNOSIS — R0989 Other specified symptoms and signs involving the circulatory and respiratory systems: Secondary | ICD-10-CM | POA: Diagnosis not present

## 2014-11-13 DIAGNOSIS — R0602 Shortness of breath: Secondary | ICD-10-CM | POA: Diagnosis present

## 2014-11-13 DIAGNOSIS — R918 Other nonspecific abnormal finding of lung field: Secondary | ICD-10-CM | POA: Diagnosis not present

## 2014-11-13 LAB — BASIC METABOLIC PANEL
ANION GAP: 8 (ref 5–15)
BUN: 38 mg/dL — ABNORMAL HIGH (ref 6–20)
CHLORIDE: 107 mmol/L (ref 101–111)
CO2: 24 mmol/L (ref 22–32)
CREATININE: 0.81 mg/dL (ref 0.61–1.24)
Calcium: 8.5 mg/dL — ABNORMAL LOW (ref 8.9–10.3)
GFR calc Af Amer: >60 mL/min (ref >60)
Glucose, Bld: 134 mg/dL — ABNORMAL HIGH (ref 65–99)
Potassium: 3.7 mmol/L (ref 3.5–5.1)
Sodium: 139 mmol/L (ref 135–145)

## 2014-11-13 LAB — CBC
HCT: 30 % — ABNORMAL LOW (ref 39.0–52.0)
Hemoglobin: 9.9 g/dL — ABNORMAL LOW (ref 13.0–17.0)
MCH: 31.5 pg (ref 26.0–34.0)
MCHC: 33 g/dL (ref 30.0–36.0)
MCV: 95.5 fL (ref 78.0–100.0)
Platelets: 148 10*3/uL — ABNORMAL LOW (ref 150–400)
RBC: 3.14 MIL/uL — AB (ref 4.22–5.81)
RDW: 14.9 % (ref 11.5–15.5)
WBC: 8.1 10*3/uL (ref 4.0–10.5)

## 2014-11-13 LAB — I-STAT TROPONIN, ED: Troponin i, poc: 0.02 ng/mL (ref 0.00–0.08)

## 2014-11-13 MED ORDER — LEVOFLOXACIN 500 MG PO TABS
500.0000 mg | ORAL_TABLET | Freq: Once | ORAL | Status: AC
  Administered 2014-11-14: 500 mg via ORAL
  Filled 2014-11-13: qty 1

## 2014-11-13 MED ORDER — LEVOFLOXACIN 500 MG PO TABS: 500.0000 mg | ORAL_TABLET | Freq: Every day | ORAL | Status: DC

## 2014-11-13 NOTE — Discharge Instructions (Signed)
Fever, Adult A fever is a higher than normal body temperature. In an adult, an oral temperature around 98.6 F (37 C) is considered normal. A temperature of 100.4 F (38 C) or higher is generally considered a fever. Mild or moderate fevers generally have no long-term effects and often do not require treatment. Extreme fever (greater than or equal to 106 F or 41.1 C) can cause seizures. The sweating that may occur with repeated or prolonged fever may cause dehydration. Elderly people can develop confusion during a fever. A measured temperature can vary with:  Age.  Time of day.  Method of measurement (mouth, underarm, rectal, or ear). The fever is confirmed by taking a temperature with a thermometer. Temperatures can be taken different ways. Some methods are accurate and some are not.  An oral temperature is used most commonly. Electronic thermometers are fast and accurate.  An ear temperature will only be accurate if the thermometer is positioned as recommended by the manufacturer.  A rectal temperature is accurate and done for those adults who have a condition where an oral temperature cannot be taken.  An underarm (axillary) temperature is not accurate and not recommended. Fever is a symptom, not a disease.  CAUSES   Infections commonly cause fever.  Some noninfectious causes for fever include:  Some arthritis conditions.  Some thyroid or adrenal gland conditions.  Some immune system conditions.  Some types of cancer.  A medicine reaction.  High doses of certain street drugs such as methamphetamine.  Dehydration.  Exposure to high outside or room temperatures.  Occasionally, the source of a fever cannot be determined. This is sometimes called a "fever of unknown origin" (FUO).  Some situations may lead to a temporary rise in body temperature that may go away on its own. Examples are:  Childbirth.  Surgery.  Intense exercise. HOME CARE INSTRUCTIONS   Take  appropriate medicines for fever. Follow dosing instructions carefully. If you use acetaminophen to reduce the fever, be careful to avoid taking other medicines that also contain acetaminophen. Do not take aspirin for a fever if you are younger than age 74. There is an association with Reye's syndrome. Reye's syndrome is a rare but potentially deadly disease.  If an infection is present and antibiotics have been prescribed, take them as directed. Finish them even if you start to feel better.  Rest as needed.  Maintain an adequate fluid intake. To prevent dehydration during an illness with prolonged or recurrent fever, you may need to drink extra fluid.Drink enough fluids to keep your urine clear or pale yellow.  Sponging or bathing with room temperature water may help reduce body temperature. Do not use ice water or alcohol sponge baths.  Dress comfortably, but do not over-bundle. SEEK MEDICAL CARE IF:   You are unable to keep fluids down.  You develop vomiting or diarrhea.  You are not feeling at least partly better after 3 days.  You develop new symptoms or problems. SEEK IMMEDIATE MEDICAL CARE IF:   You have shortness of breath or trouble breathing.  You develop excessive weakness.  You are dizzy or you faint.  You are extremely thirsty or you are making little or no urine.  You develop new pain that was not there before (such as in the head, neck, chest, back, or abdomen).  You have persistent vomiting and diarrhea for more than 1 to 2 days.  You develop a stiff neck or your eyes become sensitive to light.  You develop  a skin rash.  You have a fever or persistent symptoms for more than 2 to 3 days.  You have a fever and your symptoms suddenly get worse. MAKE SURE YOU:   Understand these instructions.  Will watch your condition.  Will get help right away if you are not doing well or get worse. Document Released: 11/24/2000 Document Revised: 10/15/2013 Document  Reviewed: 04/01/2011 [facility] Patient Information 2015 [place], [place]. This information is not intended to replace advice given to you by your health care provider. Make sure you discuss any questions you have with your health care provider.  Hospice Hospice is a service that is designed to provide people who are terminally ill and their families with medical, spiritual, and psychological support. Its aim is to improve your quality of life by keeping you as alert and comfortable as possible. Hospice is performed by a team of health care professionals and volunteers who:  Help keep you comfortable. Hospice can be provided in your home or in a homelike setting. The hospice staff works with your family and friends to help meet your needs. You will enjoy the support of loved ones by receiving much of your basic care from family and friends.  Provide pain relief and manage your symptoms. The staff supply all necessary medicines and equipment.  Provide companionship when you are alone.  Allow you and your family to rest. They may do light housekeeping, prepare meals, and run errands.  Provide counseling. They will make sure your emotional, spiritual, and social needs and those of your family are being met.  Provide spiritual care. Spiritual care is individualized to meet your needs and your family's needs. It may involve helping you look at what death means to you, say goodbye, or perform a specific religious ceremony or ritual. Hospice teams often include:  A nurse.  A doctor.  Social workers.  Religious leaders (such as a Clinical biochemist).  Trained volunteers. WHEN SHOULD HOSPICE CARE BEGIN? Most people who use hospice are believed to have fewer than 6 months to live. Your family and health care providers can help you decide when hospice services should begin. If your condition improves, you may discontinue the program. WHAT [place]? Most hospice programs are  run by nonprofit, independent organizations. Some are affiliated with hospitals, nursing homes, or home health care agencies. Hospice programs can take place in the home or at a hospice center, hospital, or skilled nursing facility. When choosing a hospice program, ask the following questions:  What services are available to me?  What services are offered to my loved ones?  How involved are my loved ones?  How involved is my health care provider?  Who makes up the hospice care team? How are they trained or screened?  How will my pain and symptoms be managed?  If my circumstances change, can the services be provided in a different setting, such as my home or in the hospital?  Is the program reviewed and licensed by the state or certified in some other way? WHERE CAN I LEARN MORE ABOUT HOSPICE? You can learn about existing hospice programs in your area from your health care providers. You can also read more about hospice online. The websites of the following organizations contain helpful information:  The [facility] and Palliative Care Organization [facility]).  The Hospice Association of America ([place]).  The [place].  The American Cancer Society (ACS).  Hospice Net. Document Released: 09/17/2003 Document Revised: 06/05/2013 Document  Reviewed: 04/10/2013 [employer] Patient Information 2015 [place], [place]. This information is not intended to replace advice given to you by your health care provider. Make sure you discuss any questions you have with your health care provider. Pneumonia Pneumonia is an infection of the lungs.  CAUSES Pneumonia may be caused by bacteria or a virus. Usually, these infections are caused by breathing infectious particles into the lungs (respiratory tract). SIGNS AND SYMPTOMS   Cough.  Fever.  Chest pain.  Increased rate of breathing.  Wheezing.  Mucus production. DIAGNOSIS  If you have the common symptoms of pneumonia, your  health care provider will typically confirm the diagnosis with a chest X-ray. The X-ray will show an abnormality in the lung (pulmonary infiltrate) if you have pneumonia. Other tests of your blood, urine, or sputum may be done to find the specific cause of your pneumonia. Your health care provider may also do tests (blood gases or pulse oximetry) to see how well your lungs are working. TREATMENT  Some forms of pneumonia may be spread to other people when you cough or sneeze. You may be asked to wear a mask before and during your exam. Pneumonia that is caused by bacteria is treated with antibiotic medicine. Pneumonia that is caused by the influenza virus may be treated with an antiviral medicine. Most other viral infections must run their course. These infections will not respond to antibiotics.  HOME CARE INSTRUCTIONS   Cough suppressants may be used if you are losing too much rest. However, coughing protects you by clearing your lungs. You should avoid using cough suppressants if you can.  Your health care provider may have prescribed medicine if he or she thinks your pneumonia is caused by bacteria or influenza. Finish your medicine even if you start to feel better.  Your health care provider may also prescribe an expectorant. This loosens the mucus to be coughed up.  Take medicines only as directed by your health care provider.  Do not smoke. Smoking is a common cause of bronchitis and can contribute to pneumonia. If you are a smoker and continue to smoke, your cough may last several weeks after your pneumonia has cleared.  A cold steam vaporizer or humidifier in your room or home may help loosen mucus.  Coughing is often worse at night. Sleeping in a semi-upright position in a recliner or using a couple pillows under your head will help with this.  Get rest as you feel it is needed. Your body will usually let you know when you need to rest. PREVENTION A pneumococcal shot (vaccine) is  available to prevent a common bacterial cause of pneumonia. This is usually suggested for:  People over 31 years old.  Patients on chemotherapy.  People with chronic lung problems, such as bronchitis or emphysema.  People with immune system problems. If you are over 65 or have a high risk condition, you may receive the pneumococcal vaccine if you have not received it before. In some countries, a routine influenza vaccine is also recommended. This vaccine can help prevent some cases of pneumonia.You may be offered the influenza vaccine as part of your care. If you smoke, it is time to quit. You may receive instructions on how to stop smoking. Your health care provider can provide medicines and counseling to help you quit. SEEK MEDICAL CARE IF: You have a fever. SEEK IMMEDIATE MEDICAL CARE IF:   Your illness becomes worse. This is especially true if you are elderly or weakened from any  other disease.  You cannot control your cough with suppressants and are losing sleep.  You begin coughing up blood.  You develop pain which is getting worse or is uncontrolled with medicines.  Any of the symptoms which initially brought you in for treatment are getting worse rather than better.  You develop shortness of breath or chest pain. MAKE SURE YOU:   Understand these instructions.  Will watch your condition.  Will get help right away if you are not doing well or get worse. Document Released: 05/31/2005 Document Revised: 10/15/2013 Document Reviewed: 08/20/2010 [facility] Patient Information 2015 [place], [place]. This information is not intended to replace advice given to you by your health care provider. Make sure you discuss any questions you have with your health care provider.

## 2014-11-13 NOTE — ED Notes (Signed)
Daughter states that pt has been in hospice care since February d/t the patient's heart only working 15%.

## 2014-11-13 NOTE — ED Notes (Signed)
Bed: WA06 Expected date:  Expected time:  Means of arrival:  Comments: EMS elderly male SOB/temp 101

## 2014-11-13 NOTE — ED Notes (Signed)
Pt is from home w/ hospice care. Hot to touch, tachypneic, lethargic. L sided lung sounds rhonchi per EMS.  Hospice nurse called EMS. 16g LFA. Alert and oriented.

## 2014-11-13 NOTE — ED Provider Notes (Signed)
TIME SEEN: 11:50 PM  CHIEF COMPLAINT: Fever, rhonchorous breath sounds  HPI: Pt is a 81 y.o. male with history of dementia, atrial fibrillation status post pacemaker placement with EF of 15%, hypertension who is currently on hospice who presents to the emergency department with complaints of fever of 100.6 at home tonight and rhonchorous breath sounds. Patient's daughter reports that he has been weak, tired today. She states that he had a temperature of 100.6. They called the hospice nurse who reported rhonchorous breath sounds and instructed them to come to the emergency department. He denies any pain currently. He denies feeling short of breath. He is on oxygen in the ED. Family reports he does wear oxygen at night. Family reports he has not been eating well today. He does not walk at baseline. He is a DO NOT RESUSCITATE/DO NOT INTUBATE.  ROS: See HPI Constitutional: no fever  Eyes: no drainage  ENT: no runny nose   Cardiovascular:  no chest pain  Resp: no SOB  GI: no vomiting GU: no dysuria Integumentary: no rash  Allergy: no hives  Musculoskeletal: no leg swelling  Neurological: no slurred speech ROS otherwise negative  PAST MEDICAL HISTORY/PAST SURGICAL HISTORY:  Past Medical History  Diagnosis Date  . Arthritis     Right knee  . Incontinence   . Hypothyroidism   . Restless leg syndrome   . Blood transfusion   . Anemia   . Lower GI bleeding 2011    a. 2005 note: slow gastrointestinal bleed, melena, and chronic anticoagulation, suspect peptic ulcer disease or erosive gastritis. b. GIB 2012.  Marland Kitchen Dementia   . Permanent atrial fibrillation     a. Prior DCCV/rhythm control unsuccessful. b. s/p AVN ablation at [facility] and subsequent SJM Pacemaker.  . Essential hypertension   . Orthostatic hypotension   . IBS (irritable bowel syndrome)   . Varicose veins     Many superficial veins along with peripheral edema  . Peripheral edema   . External hemorrhoid   . Internal hemorrhoid    . Chronic systolic CHF (congestive heart failure)     a. Echo 12/2011: EF 20-25%, akinesis of mid-distal anteroseptal, anterior, and apical myocardium; consistent with infarction; moderate AS/AI, mod-severe MR, mod-severely dilated LA, mildly dilated RA, mod TR, PASP 37mmHg.  . Corneal disease   . Pulmonary hypertension     Moderate  . Macular degeneration   . Back fracture     s/p kyphoplasty  . Chronic pain   . Bowel obstruction 02/2010    x 4 days  . Pacemaker     a. s/p AV nodal ablation at [facility].  St Jude Pacemaker. Lead fracture in 2010 and required lead revision at [facility] at that time.  . AVM (arteriovenous malformation) of colon     a. EGD 04/2014: two tiny nonbleeding AVMs, small nonbleeding duodenal ulcer, minimal antral erosions, minimal erosive esophagitis.  . Duodenal ulcer     a. EGD 04/2014: two tiny nonbleeding AVMs, small nonbleeding duodenal ulcer, minimal antral erosions, minimal erosive esophagitis.  . Esophagitis     a. EGD 04/2014: two tiny nonbleeding AVMs, small nonbleeding duodenal ulcer, minimal antral erosions, minimal erosive esophagitis.  . Aortic stenosis     a. Echo 12/2011: moderate AS.  Marland Kitchen Aortic regurgitation     a. Echo 12/2011: moderate AR.  Marland Kitchen Mitral regurgitation     a. Echo 12/2011: mod-severe MR.  . Tricuspid regurgitation     a. Echo 12/2011: moderate TR.  Marland Kitchen  Atrial flutter     a. s/p DCCV 2007 (on propafenone remotely).  . Colon polyp   . Diverticulosis   . Gait abnormality   . Peripheral neuropathy   . Bacteremia 2013  . Failure to thrive (0-17)   . Hyponatremia     MEDICATIONS:  Prior to Admission medications   Medication Sig Start Date End Date Taking? Authorizing Provider  Diaper Rash Products (DESITIN) OINT Apply 1 application topically 3 (three) times daily as needed (rash).   Yes Historical Provider, MD  donepezil (ARICEPT) 5 MG tablet Take 1 tablet (5 mg total) by mouth at bedtime. 01/17/13  Yes Star Age, MD   ferrous gluconate (FERGON) 324 MG tablet Take 1 tablet by mouth twice daily as directed 10/12/14  Yes Historical Provider, MD  furosemide (LASIX) 20 MG tablet Take 1 tablet (20 mg total) by mouth daily. 08/28/14  Yes Belva Crome, MD  levothyroxine (SYNTHROID, LEVOTHROID) 75 MCG tablet Take 75 mcg by mouth daily before breakfast.  01/03/13  Yes Historical Provider, MD  lisinopril (PRINIVIL,ZESTRIL) 2.5 MG tablet Take 1 tablet (2.5 mg total) by mouth daily. 08/28/14  Yes Belva Crome, MD  LORazepam (ATIVAN) 0.5 MG tablet Take 0.5 mg by mouth every evening.   Yes Historical Provider, MD  mirtazapine (REMERON) 15 MG tablet Take 15 mg by mouth at bedtime.   Yes Historical Provider, MD  morphine (MS CONTIN) 15 MG 12 hr tablet Take 15 mg by mouth every 12 (twelve) hours.  01/02/13  Yes Historical Provider, MD  NAMENDA 10 MG tablet TAKE 1 TABLET BY MOUTH TWICE A DAY Patient taking differently: TAKE 10MG  BY MOUTH TWICE A DAY 06/19/13  Yes Star Age, MD  Nutritional Supplements (BOOST PO) Take 1 Container by mouth daily.   Yes Historical Provider, MD  omeprazole (PRILOSEC) 40 MG capsule Take 40 mg by mouth daily. 06/19/14  Yes Historical Provider, MD  temazepam (RESTORIL) 15 MG capsule Take 1 capsule (15 mg total) by mouth at bedtime. 01/05/12  Yes Belkys A Regalado, MD  warfarin (COUMADIN) 5 MG tablet Take 5 mg by mouth daily at 6 PM.   Yes Historical Provider, MD    ALLERGIES:  Allergies  Allergen Reactions  . Contrast Media [Iodinated Diagnostic Agents] Other (See Comments)    Unknown. Neither the patient or his wife are aware of any allergy to contrast dye.   Marland Kitchen Penicillins Hives  . Uroxatral [Alfuzosin Hydrochloride] Other (See Comments)    hypotension    SOCIAL HISTORY:  History  Substance Use Topics  . Smoking status: Former Smoker -- 2.00 packs/day for 15 years    Types: Cigarettes    Quit date: 06/15/1971  . Smokeless tobacco: Never Used  . Alcohol Use: No     Comment: 07/29/11 "used to  drink some; haven't now in quite a few years"    FAMILY HISTORY: Family History  Problem Relation Age of Onset  . Adopted: Yes  . Factor V Leiden deficiency Daughter     EXAM: BP 95/49 mmHg  Pulse 71  Temp(Src) 98.1 F (36.7 C) (Oral)  Resp 19  SpO2 96% CONSTITUTIONAL: Alert and oriented and responds appropriately to questions. Elderly, chronically ill-appearing, pleasant, smiling, in no distress HEAD: Normocephalic EYES: Conjunctivae clear, PERRL ENT: normal nose; no rhinorrhea; moist mucous membranes; pharynx without lesions noted NECK: Supple, no meningismus, no LAD  CARD: RRR; S1 and S2 appreciated; no murmurs, no clicks, no rubs, no gallops RESP: Normal chest excursion without splinting or  tachypnea; breath sounds  equal bilaterally but diminished at his bases, rhonchorous breath sounds diffusely, patient is mildly hypoxic on room air, no respiratory distress, speaking full sentences, no wheezing or rales ABD/GI: Normal bowel sounds; non-distended; soft, non-tender, no rebound, no guarding, no peritoneal signs BACK:  The back appears normal and is non-tender to palpation, there is no CVA tenderness EXT: Normal ROM in all joints; non-tender to palpation; no edema; normal capillary refill; no cyanosis, no calf tenderness or swelling    SKIN: Normal color for age and race; warm NEURO: Moves all extremities equally, sensation to light touch intact diffusely, cranial nerves II through XII intact PSYCH: The patient's mood and manner are appropriate. Grooming and personal hygiene are appropriate.  MEDICAL DECISION MAKING: Patient here with fever, hypotension, hypoxia. Labs unremarkable. Chest x-ray shows no pneumonia and shows resolution of pleural effusions but clinically I feel he likely has pneumonia. Given his multiple comorbidities and that he is on hospice, family would prefer to take him home. They would like to start him on antibiotics at this time but understand that he is  critically ill. Patient is oriented and states he wants to go home with his family. We'll give Levaquin. Have discussed return precautions. They verbalize understanding and are comfortable with plan.       EKG Interpretation  Date/Time:  Wednesday November 13 2014 21:47:18 EDT Ventricular Rate:  76 PR Interval:    QRS Duration: 180 QT Interval:  468 QTC Calculation: 526 R Axis:   -46 Text Interpretation:   ventricular-paced rhythm No further analysis attempted due to paced rhythm Confirmed by WARD,  DO, KRISTEN [phone]) on 11/13/2014 11:49:58 PM         [place], DO 11/14/14 0301

## 2014-11-14 DIAGNOSIS — I509 Heart failure, unspecified: Secondary | ICD-10-CM | POA: Diagnosis not present

## 2014-11-14 DIAGNOSIS — R509 Fever, unspecified: Secondary | ICD-10-CM | POA: Diagnosis not present

## 2014-11-14 NOTE — ED Notes (Signed)
The 0329 Depature Condition was charted incorrectly

## 2014-11-14 NOTE — ED Notes (Signed)
Daughter verbally consented over phone to allow sending of Xray to [facility] [facility] who oversee's pt's care.

## 2014-11-18 DIAGNOSIS — I509 Heart failure, unspecified: Secondary | ICD-10-CM | POA: Diagnosis not present

## 2014-11-26 DIAGNOSIS — I509 Heart failure, unspecified: Secondary | ICD-10-CM | POA: Diagnosis not present

## 2014-11-28 DIAGNOSIS — I509 Heart failure, unspecified: Secondary | ICD-10-CM | POA: Diagnosis not present

## 2014-12-03 DIAGNOSIS — I509 Heart failure, unspecified: Secondary | ICD-10-CM | POA: Diagnosis not present

## 2014-12-09 DIAGNOSIS — I509 Heart failure, unspecified: Secondary | ICD-10-CM | POA: Diagnosis not present

## 2014-12-10 DIAGNOSIS — I509 Heart failure, unspecified: Secondary | ICD-10-CM | POA: Diagnosis not present

## 2014-12-11 DIAGNOSIS — I509 Heart failure, unspecified: Secondary | ICD-10-CM | POA: Diagnosis not present

## 2014-12-13 DIAGNOSIS — I509 Heart failure, unspecified: Secondary | ICD-10-CM | POA: Diagnosis not present

## 2014-12-17 DIAGNOSIS — I509 Heart failure, unspecified: Secondary | ICD-10-CM | POA: Diagnosis not present

## 2014-12-24 DIAGNOSIS — I509 Heart failure, unspecified: Secondary | ICD-10-CM | POA: Diagnosis not present

## 2014-12-31 DIAGNOSIS — I509 Heart failure, unspecified: Secondary | ICD-10-CM | POA: Diagnosis not present

## 2015-01-03 DIAGNOSIS — I509 Heart failure, unspecified: Secondary | ICD-10-CM | POA: Diagnosis not present

## 2015-01-04 DIAGNOSIS — I509 Heart failure, unspecified: Secondary | ICD-10-CM | POA: Diagnosis not present

## 2015-01-07 DIAGNOSIS — I509 Heart failure, unspecified: Secondary | ICD-10-CM | POA: Diagnosis not present

## 2015-01-13 DIAGNOSIS — I509 Heart failure, unspecified: Secondary | ICD-10-CM | POA: Diagnosis not present

## 2015-01-14 ENCOUNTER — Encounter: Payer: Self-pay | Admitting: *Deleted

## 2015-01-14 ENCOUNTER — Telehealth: Payer: Self-pay | Admitting: Interventional Cardiology

## 2015-01-14 NOTE — Telephone Encounter (Signed)
Returned call. To [facility]. Adv her that pt pcp Dr.Candace Smith follows pt INR/Coumadin. Bethena Roys will call her office for the verbal order

## 2015-01-14 NOTE — Telephone Encounter (Signed)
This encounter was created in error - please disregard.

## 2015-01-14 NOTE — Telephone Encounter (Signed)
New message       Nurse made a home visit yesterday.  Wife said pt has not had a PT/INR drawn in a long time.  She want to know if she can get an order for a PT/INR.  Please call

## 2015-01-17 DIAGNOSIS — I509 Heart failure, unspecified: Secondary | ICD-10-CM | POA: Diagnosis not present

## 2015-01-22 DIAGNOSIS — I509 Heart failure, unspecified: Secondary | ICD-10-CM | POA: Diagnosis not present

## 2015-01-27 ENCOUNTER — Encounter: Payer: Self-pay | Admitting: Internal Medicine

## 2015-01-27 ENCOUNTER — Ambulatory Visit (INDEPENDENT_AMBULATORY_CARE_PROVIDER_SITE_OTHER): Admitting: *Deleted

## 2015-01-27 DIAGNOSIS — I442 Atrioventricular block, complete: Secondary | ICD-10-CM

## 2015-01-27 NOTE — Progress Notes (Signed)
Remote pacemaker transmission.   

## 2015-01-28 DIAGNOSIS — I509 Heart failure, unspecified: Secondary | ICD-10-CM | POA: Diagnosis not present

## 2015-01-30 DIAGNOSIS — I509 Heart failure, unspecified: Secondary | ICD-10-CM | POA: Diagnosis not present

## 2015-02-01 DIAGNOSIS — I509 Heart failure, unspecified: Secondary | ICD-10-CM | POA: Diagnosis not present

## 2015-02-04 DIAGNOSIS — I509 Heart failure, unspecified: Secondary | ICD-10-CM | POA: Diagnosis not present

## 2015-02-04 LAB — CUP PACEART REMOTE DEVICE CHECK
Battery Remaining Percentage: 81 %
Battery Voltage: 2.93 V
Date Time Interrogation Session: 20160815140213
Lead Channel Impedance Value: 450 Ohm
Lead Channel Pacing Threshold Pulse Width: 0.5 ms
Lead Channel Setting Pacing Pulse Width: 0.5 ms
Lead Channel Setting Sensing Sensitivity: 2.5 mV
MDC IDC MSMT BATTERY REMAINING LONGEVITY: 112 mo
MDC IDC MSMT LEADCHNL RV PACING THRESHOLD AMPLITUDE: 1.125 V
MDC IDC MSMT LEADCHNL RV SENSING INTR AMPL: 6.9 mV
MDC IDC PG SERIAL: 2303264
MDC IDC SET LEADCHNL RV PACING AMPLITUDE: 1.375
MDC IDC STAT BRADY RV PERCENT PACED: 98 %
Pulse Gen Model: 1110

## 2015-02-06 DIAGNOSIS — I509 Heart failure, unspecified: Secondary | ICD-10-CM | POA: Diagnosis not present

## 2015-02-11 DIAGNOSIS — I509 Heart failure, unspecified: Secondary | ICD-10-CM | POA: Diagnosis not present

## 2015-02-13 DIAGNOSIS — I509 Heart failure, unspecified: Secondary | ICD-10-CM | POA: Diagnosis not present

## 2015-02-14 ENCOUNTER — Encounter: Payer: Self-pay | Admitting: Cardiology

## 2015-02-14 DIAGNOSIS — I509 Heart failure, unspecified: Secondary | ICD-10-CM | POA: Diagnosis not present

## 2015-02-18 DIAGNOSIS — I509 Heart failure, unspecified: Secondary | ICD-10-CM | POA: Diagnosis not present

## 2015-02-25 DIAGNOSIS — I509 Heart failure, unspecified: Secondary | ICD-10-CM | POA: Diagnosis not present

## 2015-03-01 DIAGNOSIS — I509 Heart failure, unspecified: Secondary | ICD-10-CM | POA: Diagnosis not present

## 2015-03-02 DIAGNOSIS — I509 Heart failure, unspecified: Secondary | ICD-10-CM | POA: Diagnosis not present

## 2015-03-03 DIAGNOSIS — I509 Heart failure, unspecified: Secondary | ICD-10-CM | POA: Diagnosis not present

## 2015-03-04 DIAGNOSIS — I509 Heart failure, unspecified: Secondary | ICD-10-CM | POA: Diagnosis not present

## 2015-03-11 DIAGNOSIS — I509 Heart failure, unspecified: Secondary | ICD-10-CM | POA: Diagnosis not present

## 2015-03-15 DIAGNOSIS — I509 Heart failure, unspecified: Secondary | ICD-10-CM | POA: Diagnosis not present

## 2015-03-18 DIAGNOSIS — I509 Heart failure, unspecified: Secondary | ICD-10-CM | POA: Diagnosis not present

## 2015-03-25 DIAGNOSIS — I509 Heart failure, unspecified: Secondary | ICD-10-CM | POA: Diagnosis not present

## 2015-03-29 DIAGNOSIS — I509 Heart failure, unspecified: Secondary | ICD-10-CM | POA: Diagnosis not present

## 2015-03-31 DIAGNOSIS — I509 Heart failure, unspecified: Secondary | ICD-10-CM | POA: Diagnosis not present

## 2015-04-02 DIAGNOSIS — I509 Heart failure, unspecified: Secondary | ICD-10-CM | POA: Diagnosis not present

## 2015-04-09 DIAGNOSIS — J441 Chronic obstructive pulmonary disease with (acute) exacerbation: Secondary | ICD-10-CM | POA: Diagnosis not present

## 2015-04-09 DIAGNOSIS — Z7901 Long term (current) use of anticoagulants: Secondary | ICD-10-CM | POA: Diagnosis not present

## 2015-04-09 DIAGNOSIS — Z23 Encounter for immunization: Secondary | ICD-10-CM | POA: Diagnosis not present

## 2015-04-09 DIAGNOSIS — M170 Bilateral primary osteoarthritis of knee: Secondary | ICD-10-CM | POA: Diagnosis not present

## 2015-04-09 DIAGNOSIS — I509 Heart failure, unspecified: Secondary | ICD-10-CM | POA: Diagnosis not present

## 2015-04-09 DIAGNOSIS — E039 Hypothyroidism, unspecified: Secondary | ICD-10-CM | POA: Diagnosis not present

## 2015-04-09 DIAGNOSIS — F0390 Unspecified dementia without behavioral disturbance: Secondary | ICD-10-CM | POA: Diagnosis not present

## 2015-04-09 DIAGNOSIS — I10 Essential (primary) hypertension: Secondary | ICD-10-CM | POA: Diagnosis not present

## 2015-04-15 DIAGNOSIS — I509 Heart failure, unspecified: Secondary | ICD-10-CM | POA: Diagnosis not present

## 2015-04-16 DIAGNOSIS — I509 Heart failure, unspecified: Secondary | ICD-10-CM | POA: Diagnosis not present

## 2015-04-22 DIAGNOSIS — I509 Heart failure, unspecified: Secondary | ICD-10-CM | POA: Diagnosis not present

## 2015-04-22 DIAGNOSIS — H6123 Impacted cerumen, bilateral: Secondary | ICD-10-CM | POA: Diagnosis not present

## 2015-04-26 DIAGNOSIS — I509 Heart failure, unspecified: Secondary | ICD-10-CM | POA: Diagnosis not present

## 2015-04-29 DIAGNOSIS — I509 Heart failure, unspecified: Secondary | ICD-10-CM | POA: Diagnosis not present

## 2015-04-30 ENCOUNTER — Ambulatory Visit (INDEPENDENT_AMBULATORY_CARE_PROVIDER_SITE_OTHER): Admitting: *Deleted

## 2015-04-30 DIAGNOSIS — I442 Atrioventricular block, complete: Secondary | ICD-10-CM | POA: Diagnosis not present

## 2015-04-30 NOTE — Progress Notes (Signed)
Remote pacemaker transmission.   

## 2015-05-01 DIAGNOSIS — I509 Heart failure, unspecified: Secondary | ICD-10-CM | POA: Diagnosis not present

## 2015-05-06 DIAGNOSIS — I509 Heart failure, unspecified: Secondary | ICD-10-CM | POA: Diagnosis not present

## 2015-05-12 DIAGNOSIS — I509 Heart failure, unspecified: Secondary | ICD-10-CM | POA: Diagnosis not present

## 2015-05-13 DIAGNOSIS — I509 Heart failure, unspecified: Secondary | ICD-10-CM | POA: Diagnosis not present

## 2015-05-15 DIAGNOSIS — I509 Heart failure, unspecified: Secondary | ICD-10-CM | POA: Diagnosis not present

## 2015-05-16 DIAGNOSIS — I509 Heart failure, unspecified: Secondary | ICD-10-CM | POA: Diagnosis not present

## 2015-05-16 LAB — CUP PACEART REMOTE DEVICE CHECK
Battery Voltage: 2.93 V
Brady Statistic RV Percent Paced: 97 %
Implantable Lead Implant Date: 20101220
Implantable Lead Location: 753860
Lead Channel Impedance Value: 440 Ohm
Lead Channel Pacing Threshold Pulse Width: 0.5 ms
Lead Channel Sensing Intrinsic Amplitude: 7.6 mV
Lead Channel Setting Pacing Amplitude: 1.25 V
Lead Channel Setting Pacing Pulse Width: 0.5 ms
MDC IDC MSMT BATTERY REMAINING LONGEVITY: 111 mo
MDC IDC MSMT BATTERY REMAINING PERCENTAGE: 81 %
MDC IDC MSMT LEADCHNL RV PACING THRESHOLD AMPLITUDE: 1 V
MDC IDC SESS DTM: 20161116151453
MDC IDC SET LEADCHNL RV SENSING SENSITIVITY: 2.5 mV
Pulse Gen Model: 1110
Pulse Gen Serial Number: 2303264

## 2015-05-19 ENCOUNTER — Encounter: Payer: Self-pay | Admitting: Cardiology

## 2015-05-20 DIAGNOSIS — I509 Heart failure, unspecified: Secondary | ICD-10-CM | POA: Diagnosis not present

## 2015-05-23 DIAGNOSIS — I509 Heart failure, unspecified: Secondary | ICD-10-CM | POA: Diagnosis not present

## 2015-05-27 DIAGNOSIS — I509 Heart failure, unspecified: Secondary | ICD-10-CM | POA: Diagnosis not present

## 2015-05-28 DIAGNOSIS — I509 Heart failure, unspecified: Secondary | ICD-10-CM | POA: Diagnosis not present

## 2015-05-30 DIAGNOSIS — I509 Heart failure, unspecified: Secondary | ICD-10-CM | POA: Diagnosis not present

## 2015-06-03 DIAGNOSIS — I509 Heart failure, unspecified: Secondary | ICD-10-CM | POA: Diagnosis not present

## 2015-06-10 DIAGNOSIS — I509 Heart failure, unspecified: Secondary | ICD-10-CM | POA: Diagnosis not present

## 2015-06-15 DIAGNOSIS — I509 Heart failure, unspecified: Secondary | ICD-10-CM | POA: Diagnosis not present

## 2015-06-16 DIAGNOSIS — I509 Heart failure, unspecified: Secondary | ICD-10-CM | POA: Diagnosis not present

## 2015-06-17 DIAGNOSIS — I509 Heart failure, unspecified: Secondary | ICD-10-CM | POA: Diagnosis not present

## 2015-06-25 DIAGNOSIS — I509 Heart failure, unspecified: Secondary | ICD-10-CM | POA: Diagnosis not present

## 2015-06-28 DIAGNOSIS — I509 Heart failure, unspecified: Secondary | ICD-10-CM | POA: Diagnosis not present

## 2015-07-01 DIAGNOSIS — I509 Heart failure, unspecified: Secondary | ICD-10-CM | POA: Diagnosis not present

## 2015-07-10 ENCOUNTER — Telehealth: Payer: Self-pay | Admitting: Interventional Cardiology

## 2015-07-10 DIAGNOSIS — I509 Heart failure, unspecified: Secondary | ICD-10-CM | POA: Diagnosis not present

## 2015-07-10 NOTE — Telephone Encounter (Signed)
Spoke with pt wife. She sts that she called the wrong office and was inteding to leave a message for pt pcp Dr.Candice Tamala Julian who has been refilling and stopped pt Lasix. She will call pt pcp for instruction. Nothing further needed

## 2015-07-10 NOTE — Telephone Encounter (Signed)
She wants to know if the pt should get back on his Lasix.

## 2015-07-15 ENCOUNTER — Telehealth: Payer: Self-pay | Admitting: Interventional Cardiology

## 2015-07-15 DIAGNOSIS — I509 Heart failure, unspecified: Secondary | ICD-10-CM | POA: Diagnosis not present

## 2015-07-15 DIAGNOSIS — I5022 Chronic systolic (congestive) heart failure: Secondary | ICD-10-CM

## 2015-07-15 MED ORDER — LISINOPRIL 2.5 MG PO TABS: 2.5000 mg | ORAL_TABLET | Freq: Every day | ORAL | Status: DC

## 2015-07-15 NOTE — Telephone Encounter (Signed)
I don't know if it can be safely resumed. Is he swelling or short of breath? If neither, I would leave him off and let us know if either occurs.

## 2015-07-15 NOTE — Telephone Encounter (Signed)
New problem   Pt's wife need to speak to nurse concerning getting pt's prescriptions filled for lisinopril. Please call pt.

## 2015-07-15 NOTE — Telephone Encounter (Signed)
Returned pt wife call. Mrs.Primas sts that pt had  been taking Lasix 20mg  daily as prescribed by Dr.Smith in March 2016. Pt Lasix has been on HOLD for about 2 weeks due to dehydration. Mrs.Huang sts that the pt  is back to normal and wants to know if is is ok to resume Lasix. Dr.Candace Tamala Julian gave the instruction to hold Lasix. I asked had been seen by her lately, Mrs.Oldenkamp says no,but lab work was ordered through pt hospice care, They suspected pt may have had a UTI, which subsequently turned out negative, asked them to defer to pcp on whether or not resume Lasix. It does not look like it was refilled by Dr.Hank Tamala Julian recently. Pt has not been seen since March 2016 Pt wife became upset and sts that Cobb Smith's name is on the bottle.  Adv Mrs.Mcconnell I will rwst labs from pt pcp. I will fwd a message to Dr.Hank Tamala Julian on whether or not to resume Lasix.  They rqst a refill of Lisinopril. Rx sent to pt pharmacy. Adv pt wife I will call back with Dr.Hank Smith's response. She verbalized understanding.

## 2015-07-16 DIAGNOSIS — I509 Heart failure, unspecified: Secondary | ICD-10-CM | POA: Diagnosis not present

## 2015-07-16 NOTE — Telephone Encounter (Signed)
Pt wife sts that pt usually has some sob, she feel this is stable. She denies swelling. Mrs.Castles aware of Dr.Smith's response. I don't know if it can be safely resumed. Is he swelling or short of breath? If neither, I would leave him off and let us know if either occurs. She agrees with plan. Leave pt off Lasix, she is to call the office if sob or swelling occurs. Mrs.Gains verbalized understanding.

## 2015-07-17 DIAGNOSIS — I509 Heart failure, unspecified: Secondary | ICD-10-CM | POA: Diagnosis not present

## 2015-07-22 DIAGNOSIS — I509 Heart failure, unspecified: Secondary | ICD-10-CM | POA: Diagnosis not present

## 2015-07-25 DIAGNOSIS — I509 Heart failure, unspecified: Secondary | ICD-10-CM | POA: Diagnosis not present

## 2015-07-30 ENCOUNTER — Ambulatory Visit (INDEPENDENT_AMBULATORY_CARE_PROVIDER_SITE_OTHER): Admitting: *Deleted

## 2015-07-30 ENCOUNTER — Telehealth: Payer: Self-pay | Admitting: Cardiology

## 2015-07-30 DIAGNOSIS — I442 Atrioventricular block, complete: Secondary | ICD-10-CM | POA: Diagnosis not present

## 2015-07-30 DIAGNOSIS — I509 Heart failure, unspecified: Secondary | ICD-10-CM | POA: Diagnosis not present

## 2015-07-30 NOTE — Telephone Encounter (Signed)
LMOVM reminding pt to send remote transmission.   

## 2015-07-30 NOTE — Progress Notes (Signed)
Remote pacemaker transmission.   

## 2015-07-31 ENCOUNTER — Telehealth: Payer: Self-pay

## 2015-07-31 NOTE — Telephone Encounter (Signed)
Signed orders placed in MR nurse fax box to fax to [facility] hosp[facility]

## 2015-08-01 DIAGNOSIS — G309 Alzheimer's disease, unspecified: Secondary | ICD-10-CM | POA: Diagnosis not present

## 2015-08-01 DIAGNOSIS — I509 Heart failure, unspecified: Secondary | ICD-10-CM | POA: Diagnosis not present

## 2015-08-04 ENCOUNTER — Telehealth: Payer: Self-pay | Admitting: Interventional Cardiology

## 2015-08-04 NOTE — Telephone Encounter (Signed)
Returned call to pt wife. lmtcb with pt caregiver

## 2015-08-04 NOTE — Telephone Encounter (Signed)
Patient's wife called wanting to get patient in next week to see Dr Tamala Julian.  Per schedule nothing is available until May, 2017. She said that nurse call them last Friday asking for them to be seen

## 2015-08-05 DIAGNOSIS — I509 Heart failure, unspecified: Secondary | ICD-10-CM | POA: Diagnosis not present

## 2015-08-05 NOTE — Telephone Encounter (Signed)
Pt sts that she thought she had received a call from our office/ or pt Hospice service that there is a form that needs to be completed by Dr.Smith, and pt would need to sign. Adv her that I was not sure what she was referring to, I don't see any documentation of any one from our office calling her.  Called pt hospice service [facility] in [place] and spoke with the [place]. She sts that nothing for this pt is needed at this time Made pt wife aware. She voiced appreciation for the update. She sts that pt is stable and does not need a f/u appt at this time. Pt home health nurse is scheduled to come to the pt home today. Nothing further is needed at this time, but will call the office if assistance is needed. Pt wife agreeable with plan and verbalized understanding.

## 2015-08-08 LAB — CUP PACEART REMOTE DEVICE CHECK
Battery Remaining Longevity: 111 mo
Battery Remaining Percentage: 81 %
Battery Voltage: 2.93 V
Brady Statistic RV Percent Paced: 94 %
Implantable Lead Location: 753860
Lead Channel Pacing Threshold Amplitude: 0.875 V
Lead Channel Pacing Threshold Pulse Width: 0.5 ms
Lead Channel Setting Pacing Amplitude: 1.125
Lead Channel Setting Pacing Pulse Width: 0.5 ms
MDC IDC LEAD IMPLANT DT: 20101220
MDC IDC MSMT LEADCHNL RV IMPEDANCE VALUE: 400 Ohm
MDC IDC PG SERIAL: 2303264
MDC IDC SESS DTM: 20170215155508
MDC IDC SET LEADCHNL RV SENSING SENSITIVITY: 2.5 mV

## 2015-08-11 DIAGNOSIS — I509 Heart failure, unspecified: Secondary | ICD-10-CM | POA: Diagnosis not present

## 2015-08-12 DIAGNOSIS — I509 Heart failure, unspecified: Secondary | ICD-10-CM | POA: Diagnosis not present

## 2015-08-13 ENCOUNTER — Encounter: Payer: Self-pay | Admitting: Cardiology

## 2015-08-13 DIAGNOSIS — I509 Heart failure, unspecified: Secondary | ICD-10-CM | POA: Diagnosis not present

## 2015-08-15 DIAGNOSIS — I509 Heart failure, unspecified: Secondary | ICD-10-CM | POA: Diagnosis not present

## 2015-08-19 DIAGNOSIS — I509 Heart failure, unspecified: Secondary | ICD-10-CM | POA: Diagnosis not present

## 2015-08-23 DIAGNOSIS — I509 Heart failure, unspecified: Secondary | ICD-10-CM | POA: Diagnosis not present

## 2015-08-28 DIAGNOSIS — I5032 Chronic diastolic (congestive) heart failure: Secondary | ICD-10-CM | POA: Diagnosis not present

## 2015-08-28 DIAGNOSIS — I509 Heart failure, unspecified: Secondary | ICD-10-CM | POA: Diagnosis not present

## 2015-09-01 DIAGNOSIS — I509 Heart failure, unspecified: Secondary | ICD-10-CM | POA: Diagnosis not present

## 2015-09-02 DIAGNOSIS — I509 Heart failure, unspecified: Secondary | ICD-10-CM | POA: Diagnosis not present

## 2015-09-07 DIAGNOSIS — I509 Heart failure, unspecified: Secondary | ICD-10-CM | POA: Diagnosis not present

## 2015-09-11 DIAGNOSIS — I509 Heart failure, unspecified: Secondary | ICD-10-CM | POA: Diagnosis not present

## 2015-09-12 DIAGNOSIS — I509 Heart failure, unspecified: Secondary | ICD-10-CM | POA: Diagnosis not present

## 2015-09-13 DIAGNOSIS — I509 Heart failure, unspecified: Secondary | ICD-10-CM | POA: Diagnosis not present

## 2015-09-15 DIAGNOSIS — I509 Heart failure, unspecified: Secondary | ICD-10-CM | POA: Diagnosis not present

## 2015-09-16 DIAGNOSIS — I509 Heart failure, unspecified: Secondary | ICD-10-CM | POA: Diagnosis not present

## 2015-09-18 DIAGNOSIS — I509 Heart failure, unspecified: Secondary | ICD-10-CM | POA: Diagnosis not present

## 2015-09-23 DIAGNOSIS — I509 Heart failure, unspecified: Secondary | ICD-10-CM | POA: Diagnosis not present

## 2015-09-30 DIAGNOSIS — I509 Heart failure, unspecified: Secondary | ICD-10-CM | POA: Diagnosis not present

## 2015-10-01 DIAGNOSIS — I509 Heart failure, unspecified: Secondary | ICD-10-CM | POA: Diagnosis not present

## 2015-10-03 DIAGNOSIS — I509 Heart failure, unspecified: Secondary | ICD-10-CM | POA: Diagnosis not present

## 2015-10-07 DIAGNOSIS — I509 Heart failure, unspecified: Secondary | ICD-10-CM | POA: Diagnosis not present

## 2015-10-13 DIAGNOSIS — I509 Heart failure, unspecified: Secondary | ICD-10-CM | POA: Diagnosis not present

## 2015-10-13 DIAGNOSIS — G309 Alzheimer's disease, unspecified: Secondary | ICD-10-CM | POA: Diagnosis not present

## 2015-10-14 DIAGNOSIS — G309 Alzheimer's disease, unspecified: Secondary | ICD-10-CM | POA: Diagnosis not present

## 2015-10-14 DIAGNOSIS — I509 Heart failure, unspecified: Secondary | ICD-10-CM | POA: Diagnosis not present

## 2015-10-15 DIAGNOSIS — G309 Alzheimer's disease, unspecified: Secondary | ICD-10-CM | POA: Diagnosis not present

## 2015-10-15 DIAGNOSIS — I509 Heart failure, unspecified: Secondary | ICD-10-CM | POA: Diagnosis not present

## 2015-10-21 DIAGNOSIS — G309 Alzheimer's disease, unspecified: Secondary | ICD-10-CM | POA: Diagnosis not present

## 2015-10-21 DIAGNOSIS — I509 Heart failure, unspecified: Secondary | ICD-10-CM | POA: Diagnosis not present

## 2015-10-22 DIAGNOSIS — E039 Hypothyroidism, unspecified: Secondary | ICD-10-CM | POA: Diagnosis not present

## 2015-10-22 DIAGNOSIS — G8929 Other chronic pain: Secondary | ICD-10-CM | POA: Diagnosis not present

## 2015-10-22 DIAGNOSIS — Z0000 Encounter for general adult medical examination without abnormal findings: Secondary | ICD-10-CM | POA: Diagnosis not present

## 2015-10-22 DIAGNOSIS — I5022 Chronic systolic (congestive) heart failure: Secondary | ICD-10-CM | POA: Diagnosis not present

## 2015-10-22 DIAGNOSIS — D509 Iron deficiency anemia, unspecified: Secondary | ICD-10-CM | POA: Diagnosis not present

## 2015-10-24 DIAGNOSIS — G309 Alzheimer's disease, unspecified: Secondary | ICD-10-CM | POA: Diagnosis not present

## 2015-10-24 DIAGNOSIS — I509 Heart failure, unspecified: Secondary | ICD-10-CM | POA: Diagnosis not present

## 2015-10-29 DIAGNOSIS — I509 Heart failure, unspecified: Secondary | ICD-10-CM | POA: Diagnosis not present

## 2015-10-29 DIAGNOSIS — G309 Alzheimer's disease, unspecified: Secondary | ICD-10-CM | POA: Diagnosis not present

## 2015-10-31 DIAGNOSIS — I509 Heart failure, unspecified: Secondary | ICD-10-CM | POA: Diagnosis not present

## 2015-10-31 DIAGNOSIS — G309 Alzheimer's disease, unspecified: Secondary | ICD-10-CM | POA: Diagnosis not present

## 2015-11-01 DIAGNOSIS — I509 Heart failure, unspecified: Secondary | ICD-10-CM | POA: Diagnosis not present

## 2015-11-01 DIAGNOSIS — G309 Alzheimer's disease, unspecified: Secondary | ICD-10-CM | POA: Diagnosis not present

## 2015-11-03 ENCOUNTER — Telehealth: Payer: Self-pay | Admitting: Interventional Cardiology

## 2015-11-03 NOTE — Telephone Encounter (Signed)
Routed to Dr.Smith for approval 

## 2015-11-03 NOTE — Telephone Encounter (Signed)
New message      Pt c/o medication issue:  1. Name of Medication: Flomax  2. How are you currently taking this medication (dosage and times per day)? 0.4mg  po tablet daily  3. Are you having a reaction (difficulty breathing--STAT)? No  4. What is your medication issue? Anderson Malta the community hospice nurse wanted Dr. Tamala Julian to be aware the pt is urinary difficulty would the Md like to order some Flomax or something for the pt. Please call her back at number provided.

## 2015-11-04 ENCOUNTER — Encounter: Payer: Medicare Other | Admitting: Nurse Practitioner

## 2015-11-04 DIAGNOSIS — G309 Alzheimer's disease, unspecified: Secondary | ICD-10-CM | POA: Diagnosis not present

## 2015-11-04 DIAGNOSIS — I509 Heart failure, unspecified: Secondary | ICD-10-CM | POA: Diagnosis not present

## 2015-11-04 NOTE — Telephone Encounter (Signed)
This should be addressed by PCP 

## 2015-11-05 NOTE — Telephone Encounter (Signed)
[facility] aware and has already received the orders from the pt's pcp Dr.Candice Tamala Julian

## 2015-11-13 DIAGNOSIS — G309 Alzheimer's disease, unspecified: Secondary | ICD-10-CM | POA: Diagnosis not present

## 2015-11-13 DIAGNOSIS — I509 Heart failure, unspecified: Secondary | ICD-10-CM | POA: Diagnosis not present

## 2015-11-14 IMAGING — CR DG ABDOMEN 1V
2 series · 2 of 2 positions shown · non-contrast
Comparison: CT abdomen pelvis 12/28/2011 and abdominal radiograph
12/26/2011.

CLINICAL DATA: Loose stools, concern for impaction.

EXAM:
ABDOMEN - 1 VIEW

[view not recorded (1 of 2)]
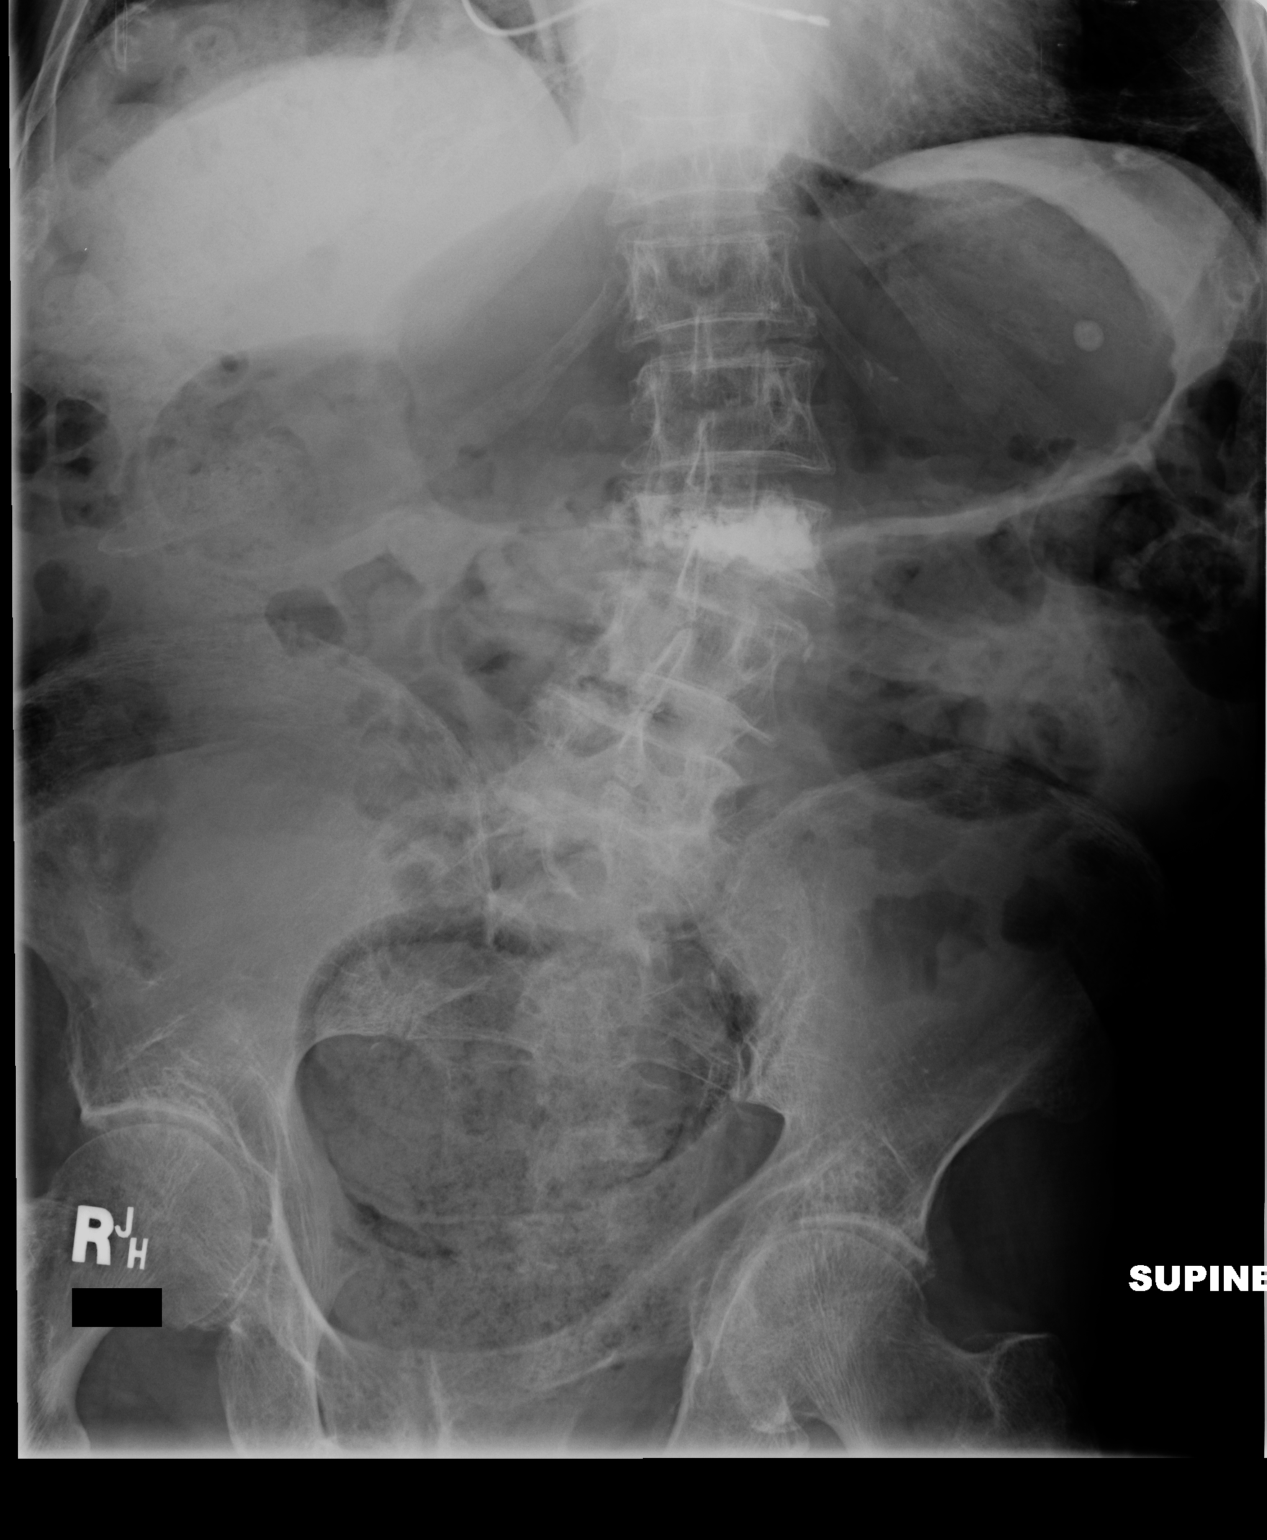

[view not recorded (2 of 2)]
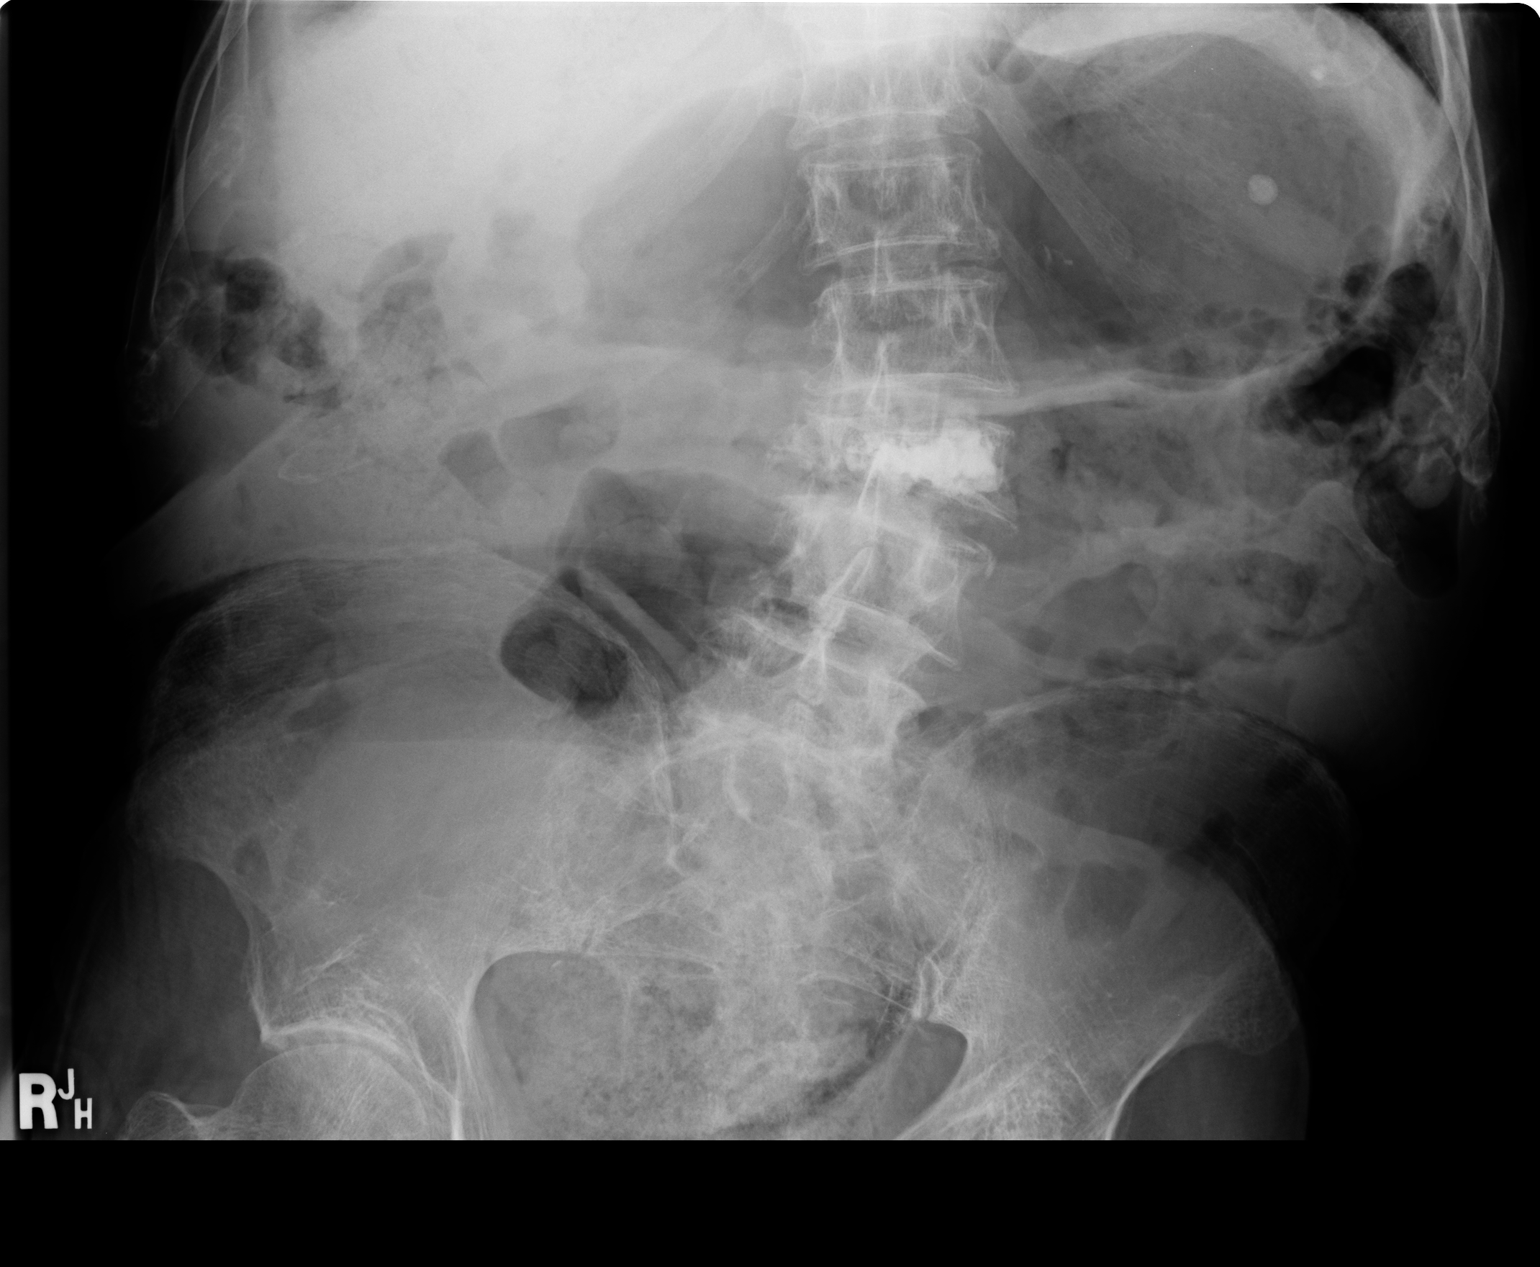

[2 of 2 positions shown; findings below may reference images not displayed]

FINDINGS: Stomach is distended with air. A fair amount of stool is seen in the
colon with a large amount in the rectosigmoid colon. No definite
small bowel dilatation. L2 vertebroplasty. Degenerative changes in
the left hip.
IMPRESSION: Constipation with fecal impaction.

## 2015-11-18 DIAGNOSIS — I509 Heart failure, unspecified: Secondary | ICD-10-CM | POA: Diagnosis not present

## 2015-11-18 DIAGNOSIS — G309 Alzheimer's disease, unspecified: Secondary | ICD-10-CM | POA: Diagnosis not present

## 2015-11-26 DIAGNOSIS — G309 Alzheimer's disease, unspecified: Secondary | ICD-10-CM | POA: Diagnosis not present

## 2015-11-26 DIAGNOSIS — I509 Heart failure, unspecified: Secondary | ICD-10-CM | POA: Diagnosis not present

## 2015-11-27 DIAGNOSIS — G309 Alzheimer's disease, unspecified: Secondary | ICD-10-CM | POA: Diagnosis not present

## 2015-11-27 DIAGNOSIS — I509 Heart failure, unspecified: Secondary | ICD-10-CM | POA: Diagnosis not present

## 2015-11-28 DIAGNOSIS — G309 Alzheimer's disease, unspecified: Secondary | ICD-10-CM | POA: Diagnosis not present

## 2015-11-28 DIAGNOSIS — I509 Heart failure, unspecified: Secondary | ICD-10-CM | POA: Diagnosis not present

## 2015-12-02 DIAGNOSIS — I509 Heart failure, unspecified: Secondary | ICD-10-CM | POA: Diagnosis not present

## 2015-12-02 DIAGNOSIS — G309 Alzheimer's disease, unspecified: Secondary | ICD-10-CM | POA: Diagnosis not present

## 2015-12-09 DIAGNOSIS — G309 Alzheimer's disease, unspecified: Secondary | ICD-10-CM | POA: Diagnosis not present

## 2015-12-09 DIAGNOSIS — I509 Heart failure, unspecified: Secondary | ICD-10-CM | POA: Diagnosis not present

## 2015-12-11 DIAGNOSIS — G309 Alzheimer's disease, unspecified: Secondary | ICD-10-CM | POA: Diagnosis not present

## 2015-12-11 DIAGNOSIS — I509 Heart failure, unspecified: Secondary | ICD-10-CM | POA: Diagnosis not present

## 2015-12-12 DIAGNOSIS — G309 Alzheimer's disease, unspecified: Secondary | ICD-10-CM | POA: Diagnosis not present

## 2015-12-12 DIAGNOSIS — I509 Heart failure, unspecified: Secondary | ICD-10-CM | POA: Diagnosis not present

## 2015-12-13 DIAGNOSIS — G309 Alzheimer's disease, unspecified: Secondary | ICD-10-CM | POA: Diagnosis not present

## 2015-12-13 DIAGNOSIS — I509 Heart failure, unspecified: Secondary | ICD-10-CM | POA: Diagnosis not present

## 2015-12-16 DIAGNOSIS — I509 Heart failure, unspecified: Secondary | ICD-10-CM | POA: Diagnosis not present

## 2015-12-16 DIAGNOSIS — G309 Alzheimer's disease, unspecified: Secondary | ICD-10-CM | POA: Diagnosis not present

## 2015-12-23 DIAGNOSIS — I509 Heart failure, unspecified: Secondary | ICD-10-CM | POA: Diagnosis not present

## 2015-12-23 DIAGNOSIS — G309 Alzheimer's disease, unspecified: Secondary | ICD-10-CM | POA: Diagnosis not present

## 2015-12-23 NOTE — Progress Notes (Signed)
Electrophysiology Office Note Date: 12/24/2015  ID:  [name], DOB 02/19/1923, MRN [id number]  PCP: Reginia Naas, MD Primary Cardiologist: Tamala Julian Electrophysiologist: Allred  CC: Pacemaker follow-up  [name] is a 82 y.o. male seen today for Dr Rayann Heman.  He presents today for routine electrophysiology followup.  Since last being seen in our clinic, the patient reports doing reasonably well.  He is now followed with home hospice.  He denies chest pain, palpitations, dyspnea, PND, orthopnea, nausea, vomiting, dizziness, syncope, edema, weight gain, or early satiety.  Device History: STJ single chamber PPM implanted 2010 for tachy/brady - s/p AVN ablation   Past Medical History  Diagnosis Date  . Arthritis     Right knee  . Incontinence   . Hypothyroidism   . Restless leg syndrome   . Blood transfusion   . Anemia   . Lower GI bleeding 2011    a. 2005 note: slow gastrointestinal bleed, melena, and chronic anticoagulation, suspect peptic ulcer disease or erosive gastritis. b. GIB 2012.  Marland Kitchen Dementia   . Permanent atrial fibrillation ([place])     a. Prior DCCV/rhythm control unsuccessful. b. s/p AVN ablation at [facility] and subsequent SJM Pacemaker.  . Essential hypertension   . Orthostatic hypotension   . IBS (irritable bowel syndrome)   . Varicose veins     Many superficial veins along with peripheral edema  . Peripheral edema   . External hemorrhoid   . Internal hemorrhoid   . Chronic systolic CHF (congestive heart failure) ([place])     a. Echo 12/2011: EF 20-25%, akinesis of mid-distal anteroseptal, anterior, and apical myocardium; consistent with infarction; moderate AS/AI, mod-severe MR, mod-severely dilated LA, mildly dilated RA, mod TR, PASP 85mmHg.  . Corneal disease   . Pulmonary hypertension (HCC)     Moderate  . Macular degeneration   . Back fracture     s/p kyphoplasty  . Chronic pain   . Bowel obstruction ([place]) 02/2010    x 4 days  . Pacemaker    a. s/p AV nodal ablation at [facility].  St Jude Pacemaker. Lead fracture in 2010 and required lead revision at [facility] at that time.  . AVM (arteriovenous malformation) of colon     a. EGD 04/2014: two tiny nonbleeding AVMs, small nonbleeding duodenal ulcer, minimal antral erosions, minimal erosive esophagitis.  . Duodenal ulcer     a. EGD 04/2014: two tiny nonbleeding AVMs, small nonbleeding duodenal ulcer, minimal antral erosions, minimal erosive esophagitis.  . Esophagitis     a. EGD 04/2014: two tiny nonbleeding AVMs, small nonbleeding duodenal ulcer, minimal antral erosions, minimal erosive esophagitis.  . Aortic stenosis     a. Echo 12/2011: moderate AS.  Marland Kitchen Aortic regurgitation     a. Echo 12/2011: moderate AR.  Marland Kitchen Mitral regurgitation     a. Echo 12/2011: mod-severe MR.  . Tricuspid regurgitation     a. Echo 12/2011: moderate TR.  Marland Kitchen Atrial flutter ([place])     a. s/p DCCV 2007 (on propafenone remotely).  . Colon polyp   . Diverticulosis   . Gait abnormality   . Peripheral neuropathy ([place])   . Bacteremia 2013  . Failure to thrive (0-17)   . Hyponatremia    Past Surgical History  Procedure Laterality Date  . Pacemaker insertion  ~ 2006    initial placement  . Pacemaker revision  05/2009    RV lead revised due to lead fracture.  performed by Dr Koleen Nimrod at [facility]  . Knee arthroscopy  ~ 2000's    right  . Back surgery      "as a kid"  . Inguinal hernia repair  1960's    left  . Inguinal hernia repair  1973    right  . Av node ablation      at [place] [facility]      Corneal, at [facility], both layers removed.  . Tonsillectomy    . Hemorrhoid surgery    . Esophagogastroduodenoscopy (egd) with propofol N/A 04/24/2014    Procedure: ESOPHAGOGASTRODUODENOSCOPY (EGD) WITH PROPOFOL;  Surgeon: Lear Ng, MD;  Location: [place];  Service: Endoscopy;  Laterality: N/A;    Current Outpatient Prescriptions  Medication Sig Dispense Refill  .  Diaper Rash Products (DESITIN) OINT Apply 1 application topically 3 (three) times daily as needed (rash).    . donepezil (ARICEPT) 5 MG tablet Take 1 tablet (5 mg total) by mouth at bedtime. 30 tablet 3  . ferrous gluconate (FERGON) 324 MG tablet Take 1 tablet by mouth twice daily as directed  3  . levofloxacin (LEVAQUIN) 500 MG tablet Take 1 tablet (500 mg total) by mouth daily. 10 tablet 0  . levothyroxine (SYNTHROID, LEVOTHROID) 75 MCG tablet Take 75 mcg by mouth daily before breakfast.     . lisinopril (PRINIVIL,ZESTRIL) 2.5 MG tablet Take 1 tablet (2.5 mg total) by mouth daily. 30 tablet 3  . LORazepam (ATIVAN) 0.5 MG tablet Take 0.5 mg by mouth every evening.    . memantine (NAMENDA) 10 MG tablet Take 10 mg by mouth 2 (two) times daily.    . mirtazapine (REMERON) 15 MG tablet Take 15 mg by mouth at bedtime.    Marland Kitchen morphine (MS CONTIN) 15 MG 12 hr tablet Take 15 mg by mouth every 12 (twelve) hours.     . Nutritional Supplements (BOOST PO) Take 1 Container by mouth daily.    Marland Kitchen omeprazole (PRILOSEC) 40 MG capsule Take 40 mg by mouth daily.  3  . tamsulosin (FLOMAX) 0.4 MG CAPS capsule Take 0.4 mg by mouth daily.  1  . temazepam (RESTORIL) 15 MG capsule Take 1 capsule (15 mg total) by mouth at bedtime. 30 capsule 0  . tiZANidine (ZANAFLEX) 2 MG tablet Take 1 tablet by mouth at bedtime.  3  . warfarin (COUMADIN) 5 MG tablet Take 5 mg by mouth daily at 6 PM.     No current facility-administered medications for this visit.    Allergies:   Contrast media; Penicillins; and Uroxatral   Social History: Social History   Social History  . Marital Status: Married    Spouse Name: N/A  . Number of Children: N/A  . Years of Education: N/A   Occupational History  . Not on file.   Social History Main Topics  . Smoking status: Former Smoker -- 2.00 packs/day for 15 years    Types: Cigarettes    Quit date: 06/15/1971  . Smokeless tobacco: Never Used  . Alcohol Use: No     Comment: 07/29/11  "used to drink some; haven't now in quite a few years"  . Drug Use: No  . Sexual Activity: Not Currently   Other Topics Concern  . 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   Has several patents.  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    Family History: Family History  Problem Relation Age of Onset  . Adopted:  Yes  . Factor V Leiden deficiency Daughter      Review of Systems: All other systems reviewed and are otherwise negative except as noted above.   Physical Exam: VS:  BP 82/46 mmHg  Pulse 76  Ht 5\' 6"  (1.676 m)  Wt 105 lb (47.628 kg)  BMI 16.96 kg/m2 , BMI Body mass index is 16.96 kg/(m^2).  GEN- The patient is elderly and chronically ill appearing, alert and oriented x 3 today, in a wheelchair HEENT: normocephalic, atraumatic; sclera clear, conjunctiva pink; hearing intact; oropharynx clear; neck supple Lungs- Clear to ausculation bilaterally, normal work of breathing.  No wheezes, rales, rhonchi Heart- Regular rate and rhythm (paced) GI- soft, non-tender, non-distended, bowel sounds present Extremities- no clubbing, cyanosis, bilateral dependent edema MS- no significant deformity or atrophy Skin- warm and dry, no rash or lesion; PPM pocket well healed Psych- euthymic mood, full affect Neuro- strength and sensation are intact  PPM Interrogation- reviewed in detail today,  See PACEART report  EKG:  EKG is not ordered today.  Recent Labs: No results found for requested labs within last 365 days.   Wt Readings from Last 3 Encounters:  12/24/15 105 lb (47.628 kg)  07/23/14 119 lb (53.978 kg)  07/18/14 120 lb (54.432 kg)     Other studies Reviewed: Additional studies/ records that were reviewed today include: Dr Jackalyn Lombard office notes  Assessment and Plan:  1.  Complete heart block s/p AVN ablation  Normal PPM function See Pace Art report No changes today  2.  Permanent atrial fibrillation Continue  Warfarin for CHADS2VASC of 4 INR followed through hospice  3.  Chronic systolic heart failure Euvolemic on exam Continue current therapy    Current medicines are reviewed at length with the patient today.   The patient does not have concerns regarding his medicines.  The following changes were made today:  none  Labs/ tests ordered today include:  Orders Placed This Encounter  Procedures  . EKG 12-Lead     Disposition:   Follow up with Delilah Shan, Dr Rayann Heman 1 year     Signed, Chanetta Marshall, NP 12/24/2015 12:10 PM  [place] [address] [phone] (office) [phone] (fax

## 2015-12-24 ENCOUNTER — Ambulatory Visit (INDEPENDENT_AMBULATORY_CARE_PROVIDER_SITE_OTHER): Payer: Medicare Other | Admitting: Nurse Practitioner

## 2015-12-24 ENCOUNTER — Encounter: Payer: Self-pay | Admitting: Nurse Practitioner

## 2015-12-24 VITALS — BP 82/46 | HR 76 | Ht 66.0 in | Wt 105.0 lb

## 2015-12-24 DIAGNOSIS — I5022 Chronic systolic (congestive) heart failure: Secondary | ICD-10-CM

## 2015-12-24 DIAGNOSIS — I442 Atrioventricular block, complete: Secondary | ICD-10-CM

## 2015-12-24 DIAGNOSIS — I482 Chronic atrial fibrillation: Secondary | ICD-10-CM | POA: Diagnosis not present

## 2015-12-24 DIAGNOSIS — I4821 Permanent atrial fibrillation: Secondary | ICD-10-CM

## 2015-12-24 LAB — CUP PACEART INCLINIC DEVICE CHECK
Date Time Interrogation Session: 20170712132039
Implantable Lead Implant Date: 20101220
Implantable Lead Location: 753860
Pulse Gen Serial Number: 2303264

## 2015-12-24 NOTE — Patient Instructions (Addendum)
Medication Instructions:   Your physician recommends that you continue on your current medications as directed. Please refer to the Current Medication list given to you today.\  If you need a refill on your cardiac medications before your next appointment, please call your pharmacy.  Labwork: NONE ORDER TODAY    Testing/Procedures: NONE ORDER TODAY    Follow-Up:  Your physician wants you to follow-up in: [place] will receive a reminder letter in the mail two months in advance. If you don't receive a letter, please call our office to schedule the follow-up appointment.  Remote monitoring is used to monitor your Pacemaker of ICD from home. This monitoring reduces the number of office visits required to check your device to one time per year. It allows [place] to keep an eye on the functioning of your device to ensure it is working properly. You are scheduled for a device check from home on . 03/25/2016..You may send your transmission at any time that day. If you have a wireless device, the transmission will be sent automatically. After your physician reviews your transmission, you will receive a postcard with your next transmission date.      Any Other Special Instructions Will Be Listed Below (If Applicable).

## 2015-12-25 ENCOUNTER — Other Ambulatory Visit: Payer: Self-pay | Admitting: Interventional Cardiology

## 2015-12-25 DIAGNOSIS — I509 Heart failure, unspecified: Secondary | ICD-10-CM | POA: Diagnosis not present

## 2015-12-25 DIAGNOSIS — G309 Alzheimer's disease, unspecified: Secondary | ICD-10-CM | POA: Diagnosis not present

## 2015-12-30 DIAGNOSIS — I509 Heart failure, unspecified: Secondary | ICD-10-CM | POA: Diagnosis not present

## 2015-12-30 DIAGNOSIS — G309 Alzheimer's disease, unspecified: Secondary | ICD-10-CM | POA: Diagnosis not present

## 2016-01-01 DIAGNOSIS — I509 Heart failure, unspecified: Secondary | ICD-10-CM | POA: Diagnosis not present

## 2016-01-01 DIAGNOSIS — G309 Alzheimer's disease, unspecified: Secondary | ICD-10-CM | POA: Diagnosis not present

## 2016-01-06 DIAGNOSIS — G309 Alzheimer's disease, unspecified: Secondary | ICD-10-CM | POA: Diagnosis not present

## 2016-01-06 DIAGNOSIS — I509 Heart failure, unspecified: Secondary | ICD-10-CM | POA: Diagnosis not present

## 2016-01-07 DIAGNOSIS — G309 Alzheimer's disease, unspecified: Secondary | ICD-10-CM | POA: Diagnosis not present

## 2016-01-07 DIAGNOSIS — I509 Heart failure, unspecified: Secondary | ICD-10-CM | POA: Diagnosis not present

## 2016-01-09 ENCOUNTER — Encounter: Payer: Self-pay | Admitting: Cardiovascular Disease

## 2016-01-13 DIAGNOSIS — G309 Alzheimer's disease, unspecified: Secondary | ICD-10-CM | POA: Diagnosis not present

## 2016-01-13 DIAGNOSIS — I509 Heart failure, unspecified: Secondary | ICD-10-CM | POA: Diagnosis not present

## 2016-01-14 DIAGNOSIS — I509 Heart failure, unspecified: Secondary | ICD-10-CM | POA: Diagnosis not present

## 2016-01-14 DIAGNOSIS — G309 Alzheimer's disease, unspecified: Secondary | ICD-10-CM | POA: Diagnosis not present

## 2016-01-20 DIAGNOSIS — G309 Alzheimer's disease, unspecified: Secondary | ICD-10-CM | POA: Diagnosis not present

## 2016-01-20 DIAGNOSIS — I509 Heart failure, unspecified: Secondary | ICD-10-CM | POA: Diagnosis not present

## 2016-01-21 DIAGNOSIS — I509 Heart failure, unspecified: Secondary | ICD-10-CM | POA: Diagnosis not present

## 2016-01-21 DIAGNOSIS — G309 Alzheimer's disease, unspecified: Secondary | ICD-10-CM | POA: Diagnosis not present

## 2016-01-28 ENCOUNTER — Telehealth: Payer: Self-pay | Admitting: Interventional Cardiology

## 2016-01-28 DIAGNOSIS — G309 Alzheimer's disease, unspecified: Secondary | ICD-10-CM | POA: Diagnosis not present

## 2016-01-28 DIAGNOSIS — I509 Heart failure, unspecified: Secondary | ICD-10-CM | POA: Diagnosis not present

## 2016-01-28 NOTE — Telephone Encounter (Signed)
New Message  Denis from [facility] is calling about pts PT/INR.   Langley Gauss voiced the last PT/INR was in April and wondering if it's being depleted from the pt or if they should continue with PT/INR.  Langley Gauss was a little concerned.  Please follow up with Langley Gauss. Thanks!

## 2016-01-28 NOTE — Telephone Encounter (Signed)
Returned call to [employer], RN @ [facility]. Adv her that the pt's pt/inr is being followed by his pcp Dr.Candace Smith. Denise voiced appreciation for the call back and will place a call to the pt pcp

## 2016-01-29 DIAGNOSIS — I509 Heart failure, unspecified: Secondary | ICD-10-CM | POA: Diagnosis not present

## 2016-01-29 DIAGNOSIS — G309 Alzheimer's disease, unspecified: Secondary | ICD-10-CM | POA: Diagnosis not present

## 2016-02-04 DIAGNOSIS — I509 Heart failure, unspecified: Secondary | ICD-10-CM | POA: Diagnosis not present

## 2016-02-04 DIAGNOSIS — G309 Alzheimer's disease, unspecified: Secondary | ICD-10-CM | POA: Diagnosis not present

## 2016-02-06 DIAGNOSIS — I509 Heart failure, unspecified: Secondary | ICD-10-CM | POA: Diagnosis not present

## 2016-02-06 DIAGNOSIS — G309 Alzheimer's disease, unspecified: Secondary | ICD-10-CM | POA: Diagnosis not present

## 2016-02-11 DIAGNOSIS — I509 Heart failure, unspecified: Secondary | ICD-10-CM | POA: Diagnosis not present

## 2016-02-11 DIAGNOSIS — G309 Alzheimer's disease, unspecified: Secondary | ICD-10-CM | POA: Diagnosis not present

## 2016-02-12 DIAGNOSIS — I509 Heart failure, unspecified: Secondary | ICD-10-CM | POA: Diagnosis not present

## 2016-02-12 DIAGNOSIS — G309 Alzheimer's disease, unspecified: Secondary | ICD-10-CM | POA: Diagnosis not present

## 2016-02-13 DIAGNOSIS — I509 Heart failure, unspecified: Secondary | ICD-10-CM | POA: Diagnosis not present

## 2016-02-13 DIAGNOSIS — G309 Alzheimer's disease, unspecified: Secondary | ICD-10-CM | POA: Diagnosis not present

## 2016-02-16 ENCOUNTER — Other Ambulatory Visit: Payer: Self-pay | Admitting: Interventional Cardiology

## 2016-02-17 DIAGNOSIS — I509 Heart failure, unspecified: Secondary | ICD-10-CM | POA: Diagnosis not present

## 2016-02-17 DIAGNOSIS — G309 Alzheimer's disease, unspecified: Secondary | ICD-10-CM | POA: Diagnosis not present

## 2016-02-20 DIAGNOSIS — I509 Heart failure, unspecified: Secondary | ICD-10-CM | POA: Diagnosis not present

## 2016-02-20 DIAGNOSIS — G309 Alzheimer's disease, unspecified: Secondary | ICD-10-CM | POA: Diagnosis not present

## 2016-02-25 DIAGNOSIS — I509 Heart failure, unspecified: Secondary | ICD-10-CM | POA: Diagnosis not present

## 2016-02-25 DIAGNOSIS — G309 Alzheimer's disease, unspecified: Secondary | ICD-10-CM | POA: Diagnosis not present

## 2016-02-26 DIAGNOSIS — I509 Heart failure, unspecified: Secondary | ICD-10-CM | POA: Diagnosis not present

## 2016-02-26 DIAGNOSIS — G309 Alzheimer's disease, unspecified: Secondary | ICD-10-CM | POA: Diagnosis not present

## 2016-03-02 DIAGNOSIS — G309 Alzheimer's disease, unspecified: Secondary | ICD-10-CM | POA: Diagnosis not present

## 2016-03-02 DIAGNOSIS — I509 Heart failure, unspecified: Secondary | ICD-10-CM | POA: Diagnosis not present

## 2016-03-09 DIAGNOSIS — I509 Heart failure, unspecified: Secondary | ICD-10-CM | POA: Diagnosis not present

## 2016-03-09 DIAGNOSIS — G309 Alzheimer's disease, unspecified: Secondary | ICD-10-CM | POA: Diagnosis not present

## 2016-03-11 DIAGNOSIS — G309 Alzheimer's disease, unspecified: Secondary | ICD-10-CM | POA: Diagnosis not present

## 2016-03-11 DIAGNOSIS — I509 Heart failure, unspecified: Secondary | ICD-10-CM | POA: Diagnosis not present

## 2016-03-14 DIAGNOSIS — G309 Alzheimer's disease, unspecified: Secondary | ICD-10-CM | POA: Diagnosis not present

## 2016-03-14 DIAGNOSIS — I509 Heart failure, unspecified: Secondary | ICD-10-CM | POA: Diagnosis not present

## 2016-03-16 DIAGNOSIS — G309 Alzheimer's disease, unspecified: Secondary | ICD-10-CM | POA: Diagnosis not present

## 2016-03-16 DIAGNOSIS — I509 Heart failure, unspecified: Secondary | ICD-10-CM | POA: Diagnosis not present

## 2016-03-18 DIAGNOSIS — I509 Heart failure, unspecified: Secondary | ICD-10-CM | POA: Diagnosis not present

## 2016-03-18 DIAGNOSIS — R05 Cough: Secondary | ICD-10-CM | POA: Diagnosis not present

## 2016-03-18 DIAGNOSIS — G309 Alzheimer's disease, unspecified: Secondary | ICD-10-CM | POA: Diagnosis not present

## 2016-03-18 DIAGNOSIS — R0602 Shortness of breath: Secondary | ICD-10-CM | POA: Diagnosis not present

## 2016-03-18 DIAGNOSIS — J449 Chronic obstructive pulmonary disease, unspecified: Secondary | ICD-10-CM | POA: Diagnosis not present

## 2016-03-19 DIAGNOSIS — I509 Heart failure, unspecified: Secondary | ICD-10-CM | POA: Diagnosis not present

## 2016-03-19 DIAGNOSIS — G309 Alzheimer's disease, unspecified: Secondary | ICD-10-CM | POA: Diagnosis not present

## 2016-03-25 ENCOUNTER — Encounter: Payer: Medicare Other | Admitting: *Deleted

## 2016-03-25 ENCOUNTER — Telehealth: Payer: Self-pay | Admitting: Cardiology

## 2016-03-25 DIAGNOSIS — G309 Alzheimer's disease, unspecified: Secondary | ICD-10-CM | POA: Diagnosis not present

## 2016-03-25 DIAGNOSIS — I509 Heart failure, unspecified: Secondary | ICD-10-CM | POA: Diagnosis not present

## 2016-03-25 NOTE — Telephone Encounter (Signed)
Confirmed remote transmission w/ pt wife.   

## 2016-03-29 DIAGNOSIS — I509 Heart failure, unspecified: Secondary | ICD-10-CM | POA: Diagnosis not present

## 2016-03-29 DIAGNOSIS — G309 Alzheimer's disease, unspecified: Secondary | ICD-10-CM | POA: Diagnosis not present

## 2016-03-31 DIAGNOSIS — G309 Alzheimer's disease, unspecified: Secondary | ICD-10-CM | POA: Diagnosis not present

## 2016-03-31 DIAGNOSIS — I509 Heart failure, unspecified: Secondary | ICD-10-CM | POA: Diagnosis not present

## 2016-04-02 DIAGNOSIS — G309 Alzheimer's disease, unspecified: Secondary | ICD-10-CM | POA: Diagnosis not present

## 2016-04-02 DIAGNOSIS — I509 Heart failure, unspecified: Secondary | ICD-10-CM | POA: Diagnosis not present

## 2016-04-06 DIAGNOSIS — G309 Alzheimer's disease, unspecified: Secondary | ICD-10-CM | POA: Diagnosis not present

## 2016-04-06 DIAGNOSIS — I509 Heart failure, unspecified: Secondary | ICD-10-CM | POA: Diagnosis not present

## 2016-04-12 DIAGNOSIS — I509 Heart failure, unspecified: Secondary | ICD-10-CM | POA: Diagnosis not present

## 2016-04-12 DIAGNOSIS — G309 Alzheimer's disease, unspecified: Secondary | ICD-10-CM | POA: Diagnosis not present

## 2016-04-13 ENCOUNTER — Other Ambulatory Visit: Payer: Self-pay | Admitting: Interventional Cardiology

## 2016-04-13 DIAGNOSIS — I509 Heart failure, unspecified: Secondary | ICD-10-CM | POA: Diagnosis not present

## 2016-04-13 DIAGNOSIS — G309 Alzheimer's disease, unspecified: Secondary | ICD-10-CM | POA: Diagnosis not present

## 2016-04-13 MED ORDER — LISINOPRIL 2.5 MG PO TABS: 2.5000 mg | ORAL_TABLET | Freq: Every day | ORAL | 1 refills | Status: DC

## 2016-04-14 DIAGNOSIS — G309 Alzheimer's disease, unspecified: Secondary | ICD-10-CM | POA: Diagnosis not present

## 2016-04-14 DIAGNOSIS — I509 Heart failure, unspecified: Secondary | ICD-10-CM | POA: Diagnosis not present

## 2016-04-15 DIAGNOSIS — G309 Alzheimer's disease, unspecified: Secondary | ICD-10-CM | POA: Diagnosis not present

## 2016-04-15 DIAGNOSIS — I509 Heart failure, unspecified: Secondary | ICD-10-CM | POA: Diagnosis not present

## 2016-04-19 DIAGNOSIS — I509 Heart failure, unspecified: Secondary | ICD-10-CM | POA: Diagnosis not present

## 2016-04-19 DIAGNOSIS — G309 Alzheimer's disease, unspecified: Secondary | ICD-10-CM | POA: Diagnosis not present

## 2016-04-23 DIAGNOSIS — G309 Alzheimer's disease, unspecified: Secondary | ICD-10-CM | POA: Diagnosis not present

## 2016-04-23 DIAGNOSIS — I509 Heart failure, unspecified: Secondary | ICD-10-CM | POA: Diagnosis not present

## 2016-04-27 DIAGNOSIS — I509 Heart failure, unspecified: Secondary | ICD-10-CM | POA: Diagnosis not present

## 2016-04-27 DIAGNOSIS — G309 Alzheimer's disease, unspecified: Secondary | ICD-10-CM | POA: Diagnosis not present

## 2016-04-28 DIAGNOSIS — G309 Alzheimer's disease, unspecified: Secondary | ICD-10-CM | POA: Diagnosis not present

## 2016-04-28 DIAGNOSIS — I509 Heart failure, unspecified: Secondary | ICD-10-CM | POA: Diagnosis not present

## 2016-05-04 DIAGNOSIS — I509 Heart failure, unspecified: Secondary | ICD-10-CM | POA: Diagnosis not present

## 2016-05-04 DIAGNOSIS — G309 Alzheimer's disease, unspecified: Secondary | ICD-10-CM | POA: Diagnosis not present

## 2016-05-09 DIAGNOSIS — G309 Alzheimer's disease, unspecified: Secondary | ICD-10-CM | POA: Diagnosis not present

## 2016-05-09 DIAGNOSIS — I509 Heart failure, unspecified: Secondary | ICD-10-CM | POA: Diagnosis not present

## 2016-05-11 DIAGNOSIS — I509 Heart failure, unspecified: Secondary | ICD-10-CM | POA: Diagnosis not present

## 2016-05-11 DIAGNOSIS — G309 Alzheimer's disease, unspecified: Secondary | ICD-10-CM | POA: Diagnosis not present

## 2016-05-12 DIAGNOSIS — G309 Alzheimer's disease, unspecified: Secondary | ICD-10-CM | POA: Diagnosis not present

## 2016-05-12 DIAGNOSIS — I509 Heart failure, unspecified: Secondary | ICD-10-CM | POA: Diagnosis not present

## 2016-05-13 DIAGNOSIS — G309 Alzheimer's disease, unspecified: Secondary | ICD-10-CM | POA: Diagnosis not present

## 2016-05-13 DIAGNOSIS — I509 Heart failure, unspecified: Secondary | ICD-10-CM | POA: Diagnosis not present

## 2016-05-14 DIAGNOSIS — G309 Alzheimer's disease, unspecified: Secondary | ICD-10-CM | POA: Diagnosis not present

## 2016-05-14 DIAGNOSIS — I509 Heart failure, unspecified: Secondary | ICD-10-CM | POA: Diagnosis not present

## 2016-05-17 DIAGNOSIS — G309 Alzheimer's disease, unspecified: Secondary | ICD-10-CM | POA: Diagnosis not present

## 2016-05-17 DIAGNOSIS — I509 Heart failure, unspecified: Secondary | ICD-10-CM | POA: Diagnosis not present

## 2016-05-18 DIAGNOSIS — I509 Heart failure, unspecified: Secondary | ICD-10-CM | POA: Diagnosis not present

## 2016-05-18 DIAGNOSIS — G309 Alzheimer's disease, unspecified: Secondary | ICD-10-CM | POA: Diagnosis not present

## 2016-05-19 ENCOUNTER — Telehealth: Payer: Self-pay | Admitting: Interventional Cardiology

## 2016-05-19 NOTE — Telephone Encounter (Signed)
I called Denise-RN back and advised Dr. Carol Ada (PCP) is following patient's PT/INR.  I advised these results should be called to Dr. Thompson Caul office. She voiced understanding and states she will call them right now.

## 2016-05-19 NOTE — Telephone Encounter (Signed)
New Message  Langley Gauss calling to provide PTINR results, please see below:  PTINR results TT-26.1 INR-2.4

## 2016-05-24 DIAGNOSIS — I509 Heart failure, unspecified: Secondary | ICD-10-CM | POA: Diagnosis not present

## 2016-05-24 DIAGNOSIS — G309 Alzheimer's disease, unspecified: Secondary | ICD-10-CM | POA: Diagnosis not present

## 2016-05-25 DIAGNOSIS — I509 Heart failure, unspecified: Secondary | ICD-10-CM | POA: Diagnosis not present

## 2016-05-25 DIAGNOSIS — G309 Alzheimer's disease, unspecified: Secondary | ICD-10-CM | POA: Diagnosis not present

## 2016-05-26 DIAGNOSIS — I509 Heart failure, unspecified: Secondary | ICD-10-CM | POA: Diagnosis not present

## 2016-05-26 DIAGNOSIS — G309 Alzheimer's disease, unspecified: Secondary | ICD-10-CM | POA: Diagnosis not present

## 2016-05-31 DIAGNOSIS — G309 Alzheimer's disease, unspecified: Secondary | ICD-10-CM | POA: Diagnosis not present

## 2016-05-31 DIAGNOSIS — I509 Heart failure, unspecified: Secondary | ICD-10-CM | POA: Diagnosis not present

## 2016-06-03 DIAGNOSIS — G309 Alzheimer's disease, unspecified: Secondary | ICD-10-CM | POA: Diagnosis not present

## 2016-06-03 DIAGNOSIS — I509 Heart failure, unspecified: Secondary | ICD-10-CM | POA: Diagnosis not present

## 2016-06-04 DIAGNOSIS — I509 Heart failure, unspecified: Secondary | ICD-10-CM | POA: Diagnosis not present

## 2016-06-04 DIAGNOSIS — G309 Alzheimer's disease, unspecified: Secondary | ICD-10-CM | POA: Diagnosis not present

## 2016-06-06 ENCOUNTER — Other Ambulatory Visit: Payer: Self-pay | Admitting: Interventional Cardiology

## 2016-06-08 DIAGNOSIS — G309 Alzheimer's disease, unspecified: Secondary | ICD-10-CM | POA: Diagnosis not present

## 2016-06-08 DIAGNOSIS — I509 Heart failure, unspecified: Secondary | ICD-10-CM | POA: Diagnosis not present

## 2016-06-09 DIAGNOSIS — I509 Heart failure, unspecified: Secondary | ICD-10-CM | POA: Diagnosis not present

## 2016-06-09 DIAGNOSIS — G309 Alzheimer's disease, unspecified: Secondary | ICD-10-CM | POA: Diagnosis not present

## 2016-06-14 DIAGNOSIS — I509 Heart failure, unspecified: Secondary | ICD-10-CM | POA: Diagnosis not present

## 2016-06-14 DIAGNOSIS — G309 Alzheimer's disease, unspecified: Secondary | ICD-10-CM | POA: Diagnosis not present

## 2016-06-18 DIAGNOSIS — G309 Alzheimer's disease, unspecified: Secondary | ICD-10-CM | POA: Diagnosis not present

## 2016-06-18 DIAGNOSIS — I509 Heart failure, unspecified: Secondary | ICD-10-CM | POA: Diagnosis not present

## 2016-06-21 DIAGNOSIS — I509 Heart failure, unspecified: Secondary | ICD-10-CM | POA: Diagnosis not present

## 2016-06-21 DIAGNOSIS — G309 Alzheimer's disease, unspecified: Secondary | ICD-10-CM | POA: Diagnosis not present

## 2016-06-22 DIAGNOSIS — I509 Heart failure, unspecified: Secondary | ICD-10-CM | POA: Diagnosis not present

## 2016-06-22 DIAGNOSIS — G309 Alzheimer's disease, unspecified: Secondary | ICD-10-CM | POA: Diagnosis not present

## 2016-06-24 DIAGNOSIS — G309 Alzheimer's disease, unspecified: Secondary | ICD-10-CM | POA: Diagnosis not present

## 2016-06-24 DIAGNOSIS — I509 Heart failure, unspecified: Secondary | ICD-10-CM | POA: Diagnosis not present

## 2016-06-29 DIAGNOSIS — I509 Heart failure, unspecified: Secondary | ICD-10-CM | POA: Diagnosis not present

## 2016-06-29 DIAGNOSIS — G309 Alzheimer's disease, unspecified: Secondary | ICD-10-CM | POA: Diagnosis not present

## 2016-07-09 DIAGNOSIS — I110 Hypertensive heart disease with heart failure: Secondary | ICD-10-CM | POA: Diagnosis not present

## 2016-07-09 DIAGNOSIS — Z79891 Long term (current) use of opiate analgesic: Secondary | ICD-10-CM | POA: Diagnosis not present

## 2016-07-09 DIAGNOSIS — I4891 Unspecified atrial fibrillation: Secondary | ICD-10-CM | POA: Diagnosis not present

## 2016-07-09 DIAGNOSIS — Z7901 Long term (current) use of anticoagulants: Secondary | ICD-10-CM | POA: Diagnosis not present

## 2016-07-09 DIAGNOSIS — Z9981 Dependence on supplemental oxygen: Secondary | ICD-10-CM | POA: Diagnosis not present

## 2016-07-09 DIAGNOSIS — I5022 Chronic systolic (congestive) heart failure: Secondary | ICD-10-CM | POA: Diagnosis not present

## 2016-07-09 DIAGNOSIS — J449 Chronic obstructive pulmonary disease, unspecified: Secondary | ICD-10-CM | POA: Diagnosis not present

## 2016-07-09 DIAGNOSIS — M1991 Primary osteoarthritis, unspecified site: Secondary | ICD-10-CM | POA: Diagnosis not present

## 2016-07-09 DIAGNOSIS — F0390 Unspecified dementia without behavioral disturbance: Secondary | ICD-10-CM | POA: Diagnosis not present

## 2016-07-13 DIAGNOSIS — I5022 Chronic systolic (congestive) heart failure: Secondary | ICD-10-CM | POA: Diagnosis not present

## 2016-07-13 DIAGNOSIS — I110 Hypertensive heart disease with heart failure: Secondary | ICD-10-CM | POA: Diagnosis not present

## 2016-07-13 DIAGNOSIS — I4891 Unspecified atrial fibrillation: Secondary | ICD-10-CM | POA: Diagnosis not present

## 2016-07-13 DIAGNOSIS — M1991 Primary osteoarthritis, unspecified site: Secondary | ICD-10-CM | POA: Diagnosis not present

## 2016-07-13 DIAGNOSIS — F0390 Unspecified dementia without behavioral disturbance: Secondary | ICD-10-CM | POA: Diagnosis not present

## 2016-07-13 DIAGNOSIS — J449 Chronic obstructive pulmonary disease, unspecified: Secondary | ICD-10-CM | POA: Diagnosis not present

## 2016-07-14 DIAGNOSIS — M1991 Primary osteoarthritis, unspecified site: Secondary | ICD-10-CM | POA: Diagnosis not present

## 2016-07-14 DIAGNOSIS — I110 Hypertensive heart disease with heart failure: Secondary | ICD-10-CM | POA: Diagnosis not present

## 2016-07-14 DIAGNOSIS — F0390 Unspecified dementia without behavioral disturbance: Secondary | ICD-10-CM | POA: Diagnosis not present

## 2016-07-14 DIAGNOSIS — J449 Chronic obstructive pulmonary disease, unspecified: Secondary | ICD-10-CM | POA: Diagnosis not present

## 2016-07-14 DIAGNOSIS — I4891 Unspecified atrial fibrillation: Secondary | ICD-10-CM | POA: Diagnosis not present

## 2016-07-14 DIAGNOSIS — I5022 Chronic systolic (congestive) heart failure: Secondary | ICD-10-CM | POA: Diagnosis not present

## 2016-08-26 ENCOUNTER — Encounter: Payer: Self-pay | Admitting: Cardiology

## 2016-08-27 ENCOUNTER — Telehealth: Payer: Self-pay

## 2016-08-27 NOTE — Telephone Encounter (Signed)
Left msg w/ pts caregiver to have pts wife call back regarding home transmitter~ pt needs DC apt to have a new monitor given and paired.

## 2016-09-09 ENCOUNTER — Ambulatory Visit (INDEPENDENT_AMBULATORY_CARE_PROVIDER_SITE_OTHER): Payer: Medicare Other | Admitting: *Deleted

## 2016-09-09 ENCOUNTER — Telehealth: Payer: Self-pay | Admitting: Cardiology

## 2016-09-09 DIAGNOSIS — I442 Atrioventricular block, complete: Secondary | ICD-10-CM

## 2016-09-09 LAB — CUP PACEART REMOTE DEVICE CHECK
Battery Remaining Longevity: 98 mo
Battery Remaining Percentage: 73 %
Battery Voltage: 2.92 V
Brady Statistic RV Percent Paced: 99 %
Date Time Interrogation Session: 20180329151817
Implantable Lead Location: 753860
Implantable Pulse Generator Implant Date: 20101220
Lead Channel Pacing Threshold Pulse Width: 0.5 ms
Lead Channel Setting Pacing Amplitude: 1.25 V
Lead Channel Setting Pacing Pulse Width: 0.5 ms
Lead Channel Setting Sensing Sensitivity: 2.5 mV
MDC IDC LEAD IMPLANT DT: 20101220
MDC IDC MSMT LEADCHNL RV IMPEDANCE VALUE: 380 Ohm
MDC IDC MSMT LEADCHNL RV PACING THRESHOLD AMPLITUDE: 1 V
MDC IDC MSMT LEADCHNL RV SENSING INTR AMPL: 4.6 mV
MDC IDC PG SERIAL: 2303264
Pulse Gen Model: 1110

## 2016-09-09 NOTE — Progress Notes (Signed)
Remote pacemaker transmission.   

## 2016-09-09 NOTE — Telephone Encounter (Signed)
Confirmed remote transmission w/ pt wife.   

## 2016-09-10 ENCOUNTER — Encounter: Payer: Self-pay | Admitting: Cardiology

## 2016-09-21 ENCOUNTER — Telehealth: Payer: Self-pay | Admitting: *Deleted

## 2016-09-21 NOTE — Telephone Encounter (Addendum)
Pt's wife request Dr. Prudence Davidson trim pt's toenails at home. Dr. Prudence Davidson states he will trim pt's toenails on Saturday. I informed pt's wife, Enid Derry.

## 2016-10-03 ENCOUNTER — Telehealth: Payer: Self-pay | Admitting: Cardiology

## 2016-10-03 NOTE — Telephone Encounter (Signed)
Wife called stating the patient has been experiencing swelling in his hands and face. Nonproductive cough, intermittent fevers. Wanting to give him one of her lasix pills. Advised against this as renal function is unclear. I instructed that she needs to bring him in for evaluation, as its unclear whether this is HF, infection or both. Stated she would consider, but I strongly encouraged her to bring him in.   Reino Bellis NP-c

## 2016-10-04 ENCOUNTER — Telehealth: Payer: Self-pay | Admitting: Interventional Cardiology

## 2016-10-04 NOTE — Telephone Encounter (Signed)
New Message     Fax number [phone]   Needs you to fax over order for hospice care

## 2016-10-04 NOTE — Telephone Encounter (Signed)
New Message  Pts wife voiced need to speak with nurse in regards to pt losing conscious.  Pts wife voiced pt will not go to hospital unless MD-Smith tells pt to do so if not pts wife voiced they need hospice care.  Please f/u

## 2016-10-04 NOTE — Telephone Encounter (Signed)
Wife called back.  Scheduled pt to see Dr. Tamala Julian 10/07/16 at [facility].

## 2016-10-04 NOTE — Telephone Encounter (Signed)
Spoke with wife and advised her that Hospice can be initiated at the hospital.  Wife states pt is refusing to go to the hospital at this time.  Spoke with pt about the importance of going and he does not wish to go at this time.  Spoke with Dr. Tamala Julian and he asked that I contact Hospice and see about getting them to come out to re evaluate the pt. Also need to get an appt with Dr. Tamala Julian.    Spoke with Hospice and they said just to fax over a prescription stating Hospice to eval and admit and they would take care of it.   Spoke with wife and she said she will call back once daughter gets to their house to make an appt because she will have to bring them.

## 2016-10-04 NOTE — Telephone Encounter (Signed)
Spoke with wife, DPR on file.  She states Home Health was there last night and was transferring pt and he passed out.  Home Health aide was able to lower pt to floor without injury.  Spoke with on call last night and was advised to go to ER but pt refused to go without Dr. Tamala Julian telling him to go.  Wife states since last Friday pt has been having a "rattling" in his chest.  Pt has swelling in hands, legs and face and is SOB.  Advised wife pt needed to get to ER as soon as possible for evaluation.  Wife verbalized understanding and was in agreement with this plan.  She is going to contact daughter to bring pt in.  Will route to Dr. Tamala Julian to make him aware.

## 2016-10-05 NOTE — Telephone Encounter (Signed)
Dialogue associated with this note is duly noted. We'll have hospice and palliative care reinstitute therapy if he is considered a reasonable candidate. Office visit this week to evaluate edema.

## 2016-10-06 NOTE — Progress Notes (Signed)
Cardiology Office Note    Date:  10/07/2016   ID:  [name], DOB [birth year], MRN [id number]  PCP:  Edward Naas, MD  Cardiologist: Sinclair Grooms, MD   Chief Complaint  Patient presents with  . Congestive Heart Failure    History of Present Illness:  [name] is a 83 y.o. male  chronic systolic heart failure, permanent atrial fibrillation, AV ablation with VVI pacer, severe MR and moderate AS/AR, h/o recurrent GI bleeding, peptic ulcer disease, and DNR initially on Hospice but disqualified due to stable longevity.  He is currently on continuous oxygen, refuses hospital admission, has been released from Hospice, and has been suffering with severe or thigh apnea and dyspnea. Appetite is still reasonably good. He without angina. They do notice lower extremity swelling.  Past Medical History:  Diagnosis Date  . Anemia   . Aortic regurgitation    a. Echo 12/2011: moderate AR.  Marland Kitchen Aortic stenosis    a. Echo 12/2011: moderate AS.  Marland Kitchen Arthritis    Right knee  . Atrial flutter ([place])    a. s/p DCCV 2007 (on propafenone remotely).  Marland Kitchen AVM (arteriovenous malformation) of colon    a. EGD 04/2014: two tiny nonbleeding AVMs, small nonbleeding duodenal ulcer, minimal antral erosions, minimal erosive esophagitis.  . Back fracture    s/p kyphoplasty  . Bacteremia 2013  . Blood transfusion   . Bowel obstruction ([place]) 02/2010   x 4 days  . Chronic pain   . Chronic systolic CHF (congestive heart failure) ([place])    a. Echo 12/2011: EF 20-25%, akinesis of mid-distal anteroseptal, anterior, and apical myocardium; consistent with infarction; moderate AS/AI, mod-severe MR, mod-severely dilated LA, mildly dilated RA, mod TR, PASP 53mmHg.  . Colon polyp   . Corneal disease   . Dementia   . Diverticulosis   . Duodenal ulcer    a. EGD 04/2014: two tiny nonbleeding AVMs, small nonbleeding duodenal ulcer, minimal antral erosions, minimal erosive esophagitis.  . Esophagitis    a. EGD  04/2014: two tiny nonbleeding AVMs, small nonbleeding duodenal ulcer, minimal antral erosions, minimal erosive esophagitis.  . Essential hypertension   . External hemorrhoid   . Failure to thrive (0-17)   . Gait abnormality   . Hyponatremia   . Hypothyroidism   . IBS (irritable bowel syndrome)   . Incontinence   . Internal hemorrhoid   . Lower GI bleeding 2011   a. 2005 note: slow gastrointestinal bleed, melena, and chronic anticoagulation, suspect peptic ulcer disease or erosive gastritis. b. GIB 2012.  . Macular degeneration   . Mitral regurgitation    a. Echo 12/2011: mod-severe MR.  . Orthostatic hypotension   . Pacemaker    a. s/p AV nodal ablation at [facility]  St Jude Pacemaker. Lead fracture in 2010 and required lead revision at [facility] at that time.  . Peripheral edema   . Peripheral neuropathy   . Permanent atrial fibrillation ([place])    a. Prior DCCV/rhythm control unsuccessful. b. s/p AVN ablation at [facility] and subsequent SJM Pacemaker.  . Pulmonary hypertension ([facility])    Moderate  . Restless leg syndrome   . Tricuspid regurgitation    a. Echo 12/2011: moderate TR.  . Varicose veins    Many superficial veins along with peripheral edema    Past Surgical History:  Procedure Laterality Date  . AV NODE ABLATION     at [place]     "as a  kid"  . ESOPHAGOGASTRODUODENOSCOPY (EGD) WITH PROPOFOL N/A 04/24/2014   Procedure: ESOPHAGOGASTRODUODENOSCOPY (EGD) WITH PROPOFOL;  Surgeon: Lear Ng, MD;  Location: [place];  Service: Endoscopy;  Laterality: N/A;  . EYE SURGERY     Corneal, at [facility], both layers removed.  Marland Kitchen HEMORRHOID SURGERY    . INGUINAL HERNIA REPAIR  1960's   left  . [place]   right  . KNEE ARTHROSCOPY  ~ 2000's   right  . PACEMAKER INSERTION  ~ 2006   initial placement  . PACEMAKER REVISION  05/2009   RV lead revised due to lead fracture.  performed by Dr Koleen Nimrod at [facility]    . TONSILLECTOMY      Current Medications: Outpatient Medications Prior to Visit  Medication Sig Dispense Refill  . Diaper Rash Products (DESITIN) OINT Apply 1 application topically 3 (three) times daily as needed (rash).    . donepezil (ARICEPT) 5 MG tablet Take 1 tablet (5 mg total) by mouth at bedtime. 30 tablet 3  . ferrous gluconate (FERGON) 324 MG tablet Take 324 mg by mouth daily.   3  . levothyroxine (SYNTHROID, LEVOTHROID) 75 MCG tablet Take 75 mcg by mouth daily before breakfast.     . lisinopril (PRINIVIL,ZESTRIL) 2.5 MG tablet TAKE 1 TABLET BY MOUTH EVERY DAY **MUST HAVE OFFICE VISIT** 30 tablet 6  . LORazepam (ATIVAN) 0.5 MG tablet Take 0.5 mg by mouth every evening.    . memantine (NAMENDA) 10 MG tablet Take 10 mg by mouth 2 (two) times daily.    . mirtazapine (REMERON) 15 MG tablet Take 15 mg by mouth at bedtime.    Marland Kitchen morphine (MS CONTIN) 15 MG 12 hr tablet Take 15 mg by mouth every 12 (twelve) hours.     . Nutritional Supplements (BOOST PO) Take 1 Container by mouth daily.    Marland Kitchen omeprazole (PRILOSEC) 40 MG capsule Take 40 mg by mouth daily.  3  . tamsulosin (FLOMAX) 0.4 MG CAPS capsule Take 0.4 mg by mouth daily.  1  . tiZANidine (ZANAFLEX) 2 MG tablet Take 1 tablet by mouth at bedtime.  3  . warfarin (COUMADIN) 5 MG tablet Take 5 mg by mouth daily at 6 PM.    . levofloxacin (LEVAQUIN) 500 MG tablet Take 1 tablet (500 mg total) by mouth daily. (Patient not taking: Reported on 10/07/2016) 10 tablet 0  . temazepam (RESTORIL) 15 MG capsule Take 1 capsule (15 mg total) by mouth at bedtime. (Patient not taking: Reported on 10/07/2016) 30 capsule 0   No facility-administered medications prior to visit.      Allergies:   Contrast media [iodinated diagnostic agents]; Penicillins; and Uroxatral [alfuzosin hydrochloride]   Social History   Social History  . Marital status: Married    Spouse name: N/A  . Number of children: N/A  . Years of education: N/A   Social History Main  Topics  . Smoking status: Former Smoker    Packs/day: 2.00    Years: 15.00    Types: Cigarettes    Quit date: 06/15/1971  . Smokeless tobacco: Never Used  . Alcohol use No     Comment: 07/29/11 "used to drink some; haven't now in quite a few years"  . Drug use: No  . Sexual activity: Not Currently   Other Topics Concern  . 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   Has several patents.  Graduated from [employer] and is a  [employer] basketball fan.     Family History:  The patient's family history includes Factor V Leiden deficiency in his daughter. He was adopted.   ROS:   Please see the history of present illness.    Facial swelling, snoring, wheezing, gurgling, difficulty urinating, excessive sweating and fatigue, cough, dizziness, near-syncope, easy bruising, PND, leg swelling, decreasing appetite and bloating.  All other systems reviewed and are negative.   PHYSICAL EXAM:   VS:  BP 112/64 (BP Location: Left Arm)   Pulse 76    O2 saturation 91% GEN: Well nourished, well developed, in no acute distress  HEENT: normal  Neck: Marked JVD to the angle of the jaw bilaterally. No carotid bruits, or masses. Cardiac: RRR; 3/6 holosystolic murmur at the left lower sternal border and apex. Soft S3 gallop is heard.There is bilateral lower extremity edema.  Respiratory:  clear to auscultation bilaterally, normal work of breathing GI: soft, nontender, nondistended, + BS MS: no deformity or atrophy  Skin: warm and dry, no rash Neuro:  Alert and Oriented x 3, Strength and sensation are intact Psych: euthymic mood, full affect  Wt Readings from Last 3 Encounters:  12/24/15 105 lb (47.6 kg)  07/23/14 119 lb (54 kg)  07/18/14 120 lb (54.4 kg)      Studies/Labs Reviewed:   EKG:  EKG  Ventricular paced rhythm  Recent Labs: No results found for requested labs within last 8760 hours.   Lipid Panel No results found for: CHOL, TRIG, HDL, CHOLHDL, VLDL, LDLCALC,  LDLDIRECT  Additional studies/ records that were reviewed today include:  No new data    ASSESSMENT:    1. Non-rheumatic mitral regurgitation   2. Atrial flutter, unspecified type ([place])   3. Chronic systolic CHF (congestive heart failure) ([place])   4. Complete heart block ([place])   5. Essential hypertension   6. Chronic anticoagulation   7. Permanent atrial fibrillation ([place])   8. Duodenal ulcer   9. Acquired hypothyroidism      PLAN:  In order of problems listed above:  1. Severe mitral regurgitation, also aortic valve involvement with LV systolic dysfunction and acute on chronic systolic heart failure. 2. Heart rate is well controlled and he is the pacing. Atrial fibrillation/flutter is in the background. 3. Acute on chronic systolic heart failure. Start furosemide with a single 80 mg one time dose to 40 mg daily. Basic metabolic panel, CBC, and BNP will be obtained.  Overall the patient is doing poorly. We have reconsult hospice. Blood work will be done today and diuretic therapy will be started. Prognosis is poor.  Medication Adjustments/Labs and Tests Ordered: Current medicines are reviewed at length with the patient today.  Concerns regarding medicines are outlined above.  Medication changes, Labs and Tests ordered today are listed in the Patient Instructions below. There are no Patient Instructions on file for this visit.   Signed, Sinclair Grooms, MD  10/07/2016 10:49 AM    [facility] [place], [place], [place]  [address] Phone: [phone]; Fax: [phone]

## 2016-10-07 ENCOUNTER — Ambulatory Visit (INDEPENDENT_AMBULATORY_CARE_PROVIDER_SITE_OTHER): Payer: Medicare Other | Admitting: Interventional Cardiology

## 2016-10-07 ENCOUNTER — Encounter: Payer: Self-pay | Admitting: Interventional Cardiology

## 2016-10-07 VITALS — BP 112/64 | HR 76

## 2016-10-07 DIAGNOSIS — I340 Nonrheumatic mitral (valve) insufficiency: Secondary | ICD-10-CM

## 2016-10-07 DIAGNOSIS — I4892 Unspecified atrial flutter: Secondary | ICD-10-CM

## 2016-10-07 DIAGNOSIS — I482 Chronic atrial fibrillation: Secondary | ICD-10-CM | POA: Diagnosis not present

## 2016-10-07 DIAGNOSIS — E039 Hypothyroidism, unspecified: Secondary | ICD-10-CM

## 2016-10-07 DIAGNOSIS — I442 Atrioventricular block, complete: Secondary | ICD-10-CM

## 2016-10-07 DIAGNOSIS — I5022 Chronic systolic (congestive) heart failure: Secondary | ICD-10-CM | POA: Diagnosis not present

## 2016-10-07 DIAGNOSIS — K269 Duodenal ulcer, unspecified as acute or chronic, without hemorrhage or perforation: Secondary | ICD-10-CM | POA: Diagnosis not present

## 2016-10-07 DIAGNOSIS — Z7901 Long term (current) use of anticoagulants: Secondary | ICD-10-CM

## 2016-10-07 DIAGNOSIS — I10 Essential (primary) hypertension: Secondary | ICD-10-CM

## 2016-10-07 DIAGNOSIS — I4821 Permanent atrial fibrillation: Secondary | ICD-10-CM

## 2016-10-07 LAB — BASIC METABOLIC PANEL
BUN/Creatinine Ratio: 34 — ABNORMAL HIGH (ref 10–24)
BUN: 27 mg/dL (ref 10–36)
CO2: 24 mmol/L (ref 18–29)
Calcium: 8.6 mg/dL (ref 8.6–10.2)
Chloride: 91 mmol/L — ABNORMAL LOW (ref 96–106)
Creatinine, Ser: 0.8 mg/dL (ref 0.76–1.27)
GFR, EST AFRICAN AMERICAN: 90 mL/min/{1.73_m2} (ref >59)
GFR, EST NON AFRICAN AMERICAN: 78 mL/min/{1.73_m2} (ref >59)
Glucose: 209 mg/dL — ABNORMAL HIGH (ref 65–99)
Potassium: 4.7 mmol/L (ref 3.5–5.2)
Sodium: 125 mmol/L — ABNORMAL LOW (ref 134–144)

## 2016-10-07 LAB — CBC
Hematocrit: 35.9 % — ABNORMAL LOW (ref 37.5–51.0)
Hemoglobin: 12.3 g/dL — ABNORMAL LOW (ref 13.0–17.7)
MCH: 32.5 pg (ref 26.6–33.0)
MCHC: 34.3 g/dL (ref 31.5–35.7)
MCV: 95 fL (ref 79–97)
Platelets: 192 10*3/uL (ref 150–379)
RBC: 3.79 x10E6/uL — ABNORMAL LOW (ref 4.14–5.80)
RDW: 14.1 % (ref 12.3–15.4)
WBC: 6.3 10*3/uL (ref 3.4–10.8)

## 2016-10-07 LAB — PRO B NATRIURETIC PEPTIDE: NT-Pro BNP: 12173 pg/mL — ABNORMAL HIGH (ref 0–486)

## 2016-10-07 MED ORDER — FUROSEMIDE 40 MG PO TABS: 40.0000 mg | ORAL_TABLET | Freq: Every day | ORAL | 3 refills | Status: DC

## 2016-10-07 NOTE — Patient Instructions (Signed)
Medication Instructions:  1) START Furosemide today.  Take Furosemide 80mg  today for a one time dose, then decrease to 40mg  once daily.  Labwork: BMET, BNP and CBC today  Testing/Procedures: None  Follow-Up: Your physician recommends that you schedule a follow-up appointment as needed with Dr. Tamala Julian.   Any Other Special Instructions Will Be Listed Below (If Applicable).  Please weigh daily.  If weight increases 3 pounds overnight or 5 pounds in a week, please contact our office.   If you need a refill on your cardiac medications before your next appointment, please call your pharmacy.

## 2016-10-09 ENCOUNTER — Inpatient Hospital Stay (HOSPITAL_COMMUNITY)
Admission: EM | Admit: 2016-10-09 | Discharge: 2016-10-12 | DRG: 291 | Disposition: A | Discharge status: Hospice | Payer: Medicare Other | Attending: Internal Medicine | Admitting: Internal Medicine

## 2016-10-09 ENCOUNTER — Telehealth: Payer: Self-pay | Admitting: Physician Assistant

## 2016-10-09 ENCOUNTER — Emergency Department (HOSPITAL_COMMUNITY): Payer: Medicare Other

## 2016-10-09 ENCOUNTER — Encounter (HOSPITAL_COMMUNITY): Payer: Self-pay | Admitting: Emergency Medicine

## 2016-10-09 DIAGNOSIS — Z515 Encounter for palliative care: Secondary | ICD-10-CM | POA: Diagnosis present

## 2016-10-09 DIAGNOSIS — I110 Hypertensive heart disease with heart failure: Secondary | ICD-10-CM | POA: Diagnosis present

## 2016-10-09 DIAGNOSIS — F0390 Unspecified dementia without behavioral disturbance: Secondary | ICD-10-CM | POA: Diagnosis present

## 2016-10-09 DIAGNOSIS — E875 Hyperkalemia: Secondary | ICD-10-CM | POA: Diagnosis present

## 2016-10-09 DIAGNOSIS — E871 Hypo-osmolality and hyponatremia: Secondary | ICD-10-CM | POA: Diagnosis present

## 2016-10-09 DIAGNOSIS — G2581 Restless legs syndrome: Secondary | ICD-10-CM | POA: Diagnosis not present

## 2016-10-09 DIAGNOSIS — I482 Chronic atrial fibrillation: Secondary | ICD-10-CM | POA: Diagnosis present

## 2016-10-09 DIAGNOSIS — Z7401 Bed confinement status: Secondary | ICD-10-CM

## 2016-10-09 DIAGNOSIS — I272 Pulmonary hypertension, unspecified: Secondary | ICD-10-CM | POA: Diagnosis present

## 2016-10-09 DIAGNOSIS — I509 Heart failure, unspecified: Secondary | ICD-10-CM | POA: Diagnosis not present

## 2016-10-09 DIAGNOSIS — I5084 End stage heart failure: Secondary | ICD-10-CM | POA: Diagnosis present

## 2016-10-09 DIAGNOSIS — Z7901 Long term (current) use of anticoagulants: Secondary | ICD-10-CM

## 2016-10-09 DIAGNOSIS — I5023 Acute on chronic systolic (congestive) heart failure: Secondary | ICD-10-CM | POA: Diagnosis present

## 2016-10-09 DIAGNOSIS — J9620 Acute and chronic respiratory failure, unspecified whether with hypoxia or hypercapnia: Secondary | ICD-10-CM | POA: Diagnosis present

## 2016-10-09 DIAGNOSIS — H3530 Unspecified macular degeneration: Secondary | ICD-10-CM | POA: Diagnosis present

## 2016-10-09 DIAGNOSIS — R627 Adult failure to thrive: Secondary | ICD-10-CM | POA: Diagnosis present

## 2016-10-09 DIAGNOSIS — Z681 Body mass index (BMI) 19 or less, adult: Secondary | ICD-10-CM | POA: Diagnosis not present

## 2016-10-09 DIAGNOSIS — Z66 Do not resuscitate: Secondary | ICD-10-CM | POA: Diagnosis present

## 2016-10-09 DIAGNOSIS — E039 Hypothyroidism, unspecified: Secondary | ICD-10-CM | POA: Diagnosis present

## 2016-10-09 DIAGNOSIS — G4700 Insomnia, unspecified: Secondary | ICD-10-CM | POA: Diagnosis present

## 2016-10-09 DIAGNOSIS — Z87891 Personal history of nicotine dependence: Secondary | ICD-10-CM

## 2016-10-09 DIAGNOSIS — J9811 Atelectasis: Secondary | ICD-10-CM | POA: Diagnosis present

## 2016-10-09 DIAGNOSIS — J9621 Acute and chronic respiratory failure with hypoxia: Secondary | ICD-10-CM | POA: Diagnosis present

## 2016-10-09 DIAGNOSIS — Z79899 Other long term (current) drug therapy: Secondary | ICD-10-CM

## 2016-10-09 DIAGNOSIS — Z79891 Long term (current) use of opiate analgesic: Secondary | ICD-10-CM

## 2016-10-09 DIAGNOSIS — Z9981 Dependence on supplemental oxygen: Secondary | ICD-10-CM

## 2016-10-09 DIAGNOSIS — E876 Hypokalemia: Secondary | ICD-10-CM | POA: Diagnosis not present

## 2016-10-09 DIAGNOSIS — N400 Enlarged prostate without lower urinary tract symptoms: Secondary | ICD-10-CM | POA: Diagnosis present

## 2016-10-09 DIAGNOSIS — E43 Unspecified severe protein-calorie malnutrition: Secondary | ICD-10-CM

## 2016-10-09 DIAGNOSIS — K219 Gastro-esophageal reflux disease without esophagitis: Secondary | ICD-10-CM | POA: Diagnosis not present

## 2016-10-09 DIAGNOSIS — F0150 Vascular dementia without behavioral disturbance: Secondary | ICD-10-CM | POA: Diagnosis present

## 2016-10-09 DIAGNOSIS — F418 Other specified anxiety disorders: Secondary | ICD-10-CM | POA: Diagnosis not present

## 2016-10-09 DIAGNOSIS — R0602 Shortness of breath: Secondary | ICD-10-CM

## 2016-10-09 DIAGNOSIS — G894 Chronic pain syndrome: Secondary | ICD-10-CM | POA: Diagnosis present

## 2016-10-09 DIAGNOSIS — R7989 Other specified abnormal findings of blood chemistry: Secondary | ICD-10-CM

## 2016-10-09 DIAGNOSIS — M4800 Spinal stenosis, site unspecified: Secondary | ICD-10-CM | POA: Diagnosis present

## 2016-10-09 DIAGNOSIS — J90 Pleural effusion, not elsewhere classified: Secondary | ICD-10-CM | POA: Diagnosis present

## 2016-10-09 DIAGNOSIS — R531 Weakness: Secondary | ICD-10-CM

## 2016-10-09 LAB — BRAIN NATRIURETIC PEPTIDE: B NATRIURETIC PEPTIDE 5: 1165 pg/mL — AB (ref 0.0–100.0)

## 2016-10-09 LAB — CBC WITH DIFFERENTIAL/PLATELET
BASOS ABS: 0 10*3/uL (ref 0.0–0.1)
BASOS PCT: 0 %
Eosinophils Absolute: 0 10*3/uL (ref 0.0–0.7)
Eosinophils Relative: 1 %
HEMATOCRIT: 34.9 % — AB (ref 39.0–52.0)
Hemoglobin: 12 g/dL — ABNORMAL LOW (ref 13.0–17.0)
LYMPHS PCT: 10 %
Lymphs Abs: 0.6 10*3/uL — ABNORMAL LOW (ref 0.7–4.0)
MCH: 32.1 pg (ref 26.0–34.0)
MCHC: 34.4 g/dL (ref 30.0–36.0)
MCV: 93.3 fL (ref 78.0–100.0)
MONO ABS: 0.7 10*3/uL (ref 0.1–1.0)
Monocytes Relative: 11 %
NEUTROS ABS: 5 10*3/uL (ref 1.7–7.7)
NEUTROS PCT: 78 %
PLATELETS: 184 10*3/uL (ref 150–400)
RBC: 3.74 MIL/uL — ABNORMAL LOW (ref 4.22–5.81)
RDW: 14.4 % (ref 11.5–15.5)
WBC: 6.3 10*3/uL (ref 4.0–10.5)

## 2016-10-09 LAB — COMPREHENSIVE METABOLIC PANEL
ALK PHOS: 59 U/L (ref 38–126)
ALT: 19 U/L (ref 17–63)
AST: 26 U/L (ref 15–41)
Albumin: 3.5 g/dL (ref 3.5–5.0)
Anion gap: 9 (ref 5–15)
BILIRUBIN TOTAL: 0.6 mg/dL (ref 0.3–1.2)
BUN: 28 mg/dL — AB (ref 6–20)
CO2: 20 mmol/L — ABNORMAL LOW (ref 22–32)
CREATININE: 0.95 mg/dL (ref 0.61–1.24)
Calcium: 8.4 mg/dL — ABNORMAL LOW (ref 8.9–10.3)
Chloride: 91 mmol/L — ABNORMAL LOW (ref 101–111)
Glucose, Bld: 173 mg/dL — ABNORMAL HIGH (ref 65–99)
Potassium: 4.1 mmol/L (ref 3.5–5.1)
Sodium: 120 mmol/L — ABNORMAL LOW (ref 135–145)
TOTAL PROTEIN: 6.6 g/dL (ref 6.5–8.1)

## 2016-10-09 LAB — I-STAT TROPONIN, ED: TROPONIN I, POC: 0.02 ng/mL (ref 0.00–0.08)

## 2016-10-09 MED ORDER — DONEPEZIL HCL 5 MG PO TABS
5.0000 mg | ORAL_TABLET | Freq: Every day | ORAL | Status: DC
  Administered 2016-10-09 – 2016-10-11 (×3): 5 mg via ORAL
  Filled 2016-10-09 (×3): qty 1

## 2016-10-09 MED ORDER — MIRTAZAPINE 15 MG PO TABS
15.0000 mg | ORAL_TABLET | Freq: Every day | ORAL | Status: DC
  Administered 2016-10-09 – 2016-10-11 (×3): 15 mg via ORAL
  Filled 2016-10-09 (×3): qty 1

## 2016-10-09 MED ORDER — MORPHINE SULFATE ER 15 MG PO TBCR
15.0000 mg | EXTENDED_RELEASE_TABLET | Freq: Two times a day (BID) | ORAL | Status: DC
  Administered 2016-10-09 – 2016-10-12 (×6): 15 mg via ORAL
  Filled 2016-10-09 (×5): qty 1

## 2016-10-09 MED ORDER — LEVOTHYROXINE SODIUM 75 MCG PO TABS
75.0000 ug | ORAL_TABLET | Freq: Every day | ORAL | Status: DC
  Administered 2016-10-10 – 2016-10-12 (×3): 75 ug via ORAL
  Filled 2016-10-09 (×4): qty 1

## 2016-10-09 MED ORDER — SODIUM CHLORIDE 0.9 % IV SOLN: 250.0000 mL | INTRAVENOUS | Status: DC | PRN

## 2016-10-09 MED ORDER — FUROSEMIDE 10 MG/ML IJ SOLN
30.0000 mg | Freq: Two times a day (BID) | INTRAMUSCULAR | Status: DC
  Administered 2016-10-10: 30 mg via INTRAVENOUS
  Filled 2016-10-09: qty 4

## 2016-10-09 MED ORDER — LORAZEPAM 0.5 MG PO TABS: 0.5000 mg | ORAL_TABLET | Freq: Two times a day (BID) | ORAL | Status: DC | PRN

## 2016-10-09 MED ORDER — SODIUM CHLORIDE 0.9% FLUSH
3.0000 mL | Freq: Two times a day (BID) | INTRAVENOUS | Status: DC
  Administered 2016-10-09: 3 mL via INTRAVENOUS

## 2016-10-09 MED ORDER — ENSURE ENLIVE PO LIQD
237.0000 mL | Freq: Two times a day (BID) | ORAL | Status: DC
  Administered 2016-10-10 – 2016-10-12 (×5): 237 mL via ORAL

## 2016-10-09 MED ORDER — TEMAZEPAM 7.5 MG PO CAPS
7.5000 mg | ORAL_CAPSULE | Freq: Every day | ORAL | Status: DC
  Administered 2016-10-09 – 2016-10-11 (×3): 7.5 mg via ORAL
  Filled 2016-10-09 (×3): qty 1

## 2016-10-09 MED ORDER — MORPHINE SULFATE ER 15 MG PO TBCR
15.0000 mg | EXTENDED_RELEASE_TABLET | Freq: Two times a day (BID) | ORAL | Status: DC
  Filled 2016-10-09: qty 1

## 2016-10-09 MED ORDER — FUROSEMIDE 10 MG/ML IJ SOLN
40.0000 mg | Freq: Once | INTRAMUSCULAR | Status: AC
  Administered 2016-10-09: 40 mg via INTRAVENOUS
  Filled 2016-10-09: qty 4

## 2016-10-09 MED ORDER — TAMSULOSIN HCL 0.4 MG PO CAPS
0.4000 mg | ORAL_CAPSULE | Freq: Every day | ORAL | Status: DC
  Administered 2016-10-10 – 2016-10-12 (×3): 0.4 mg via ORAL
  Filled 2016-10-09 (×3): qty 1

## 2016-10-09 MED ORDER — FERROUS GLUCONATE 324 (38 FE) MG PO TABS
324.0000 mg | ORAL_TABLET | Freq: Every day | ORAL | Status: DC
  Administered 2016-10-10 – 2016-10-12 (×3): 324 mg via ORAL
  Filled 2016-10-09 (×3): qty 1

## 2016-10-09 MED ORDER — MEMANTINE HCL 10 MG PO TABS
10.0000 mg | ORAL_TABLET | Freq: Two times a day (BID) | ORAL | Status: DC
  Administered 2016-10-09 – 2016-10-12 (×6): 10 mg via ORAL
  Filled 2016-10-09 (×6): qty 1

## 2016-10-09 MED ORDER — ONDANSETRON HCL 4 MG/2ML IJ SOLN: 4.0000 mg | Freq: Four times a day (QID) | INTRAMUSCULAR | Status: DC | PRN

## 2016-10-09 MED ORDER — ACETAMINOPHEN 325 MG PO TABS
650.0000 mg | ORAL_TABLET | ORAL | Status: DC | PRN
  Administered 2016-10-11: 650 mg via ORAL
  Filled 2016-10-09: qty 2

## 2016-10-09 MED ORDER — PANTOPRAZOLE SODIUM 40 MG PO TBEC
40.0000 mg | DELAYED_RELEASE_TABLET | Freq: Every day | ORAL | Status: DC
  Administered 2016-10-10 – 2016-10-12 (×3): 40 mg via ORAL
  Filled 2016-10-09 (×3): qty 1

## 2016-10-09 MED ORDER — SODIUM CHLORIDE 0.9% FLUSH
3.0000 mL | INTRAVENOUS | Status: DC | PRN
Start: 2016-10-09 — End: 2016-10-10

## 2016-10-09 NOTE — ED Triage Notes (Addendum)
PT IS DNR. DNR placed at bedside. Pt arrived via [facility] from home. Pt experiencing increasing sob. Pt has hx of CHF; wears continuous oxygen at home, 3LPM [place]. No medication given by EMS. Pt is alert and oriented x4. Pt has home CNA at bedside. Pt not in distress at this time. Pt is scheduled to begin hospice care tomorrow.

## 2016-10-09 NOTE — Telephone Encounter (Signed)
Patient was recently seen by Dr. Tamala Julian for worsening volume overload. Patient by his wife today regarding worsening shortness of breath. He had a history of refusal for admission. He is currently being followed by hospice. He will be seen by his hospice nurse practitioner this afternoon. According to the wife, he has been having increasing amount of PND episode and severely short of breath. I recommended increase Lasix to 80 mg daily, follow-up with his hospice nurse and potentially reconsider coming to the hospital. Ultimately, I do not think his condition will improve as outpatient and may continue to decline unless he would be more agreeable for IV Lasix.  Hilbert Corrigan PA Pager: [phone]

## 2016-10-09 NOTE — H&P (Signed)
History and Physical    [name]:[id number] DOB: [birth year] DOA: 10/09/2016  PCP: Reginia Naas, MD   I have briefly reviewed patients previous medical reports in [facility].  Patient coming from: home  Chief Complaint: worsening SOB, leg swelling and orthopnea.  HPI: [name] is a 83 y/o male with PMH significant for chronic systolic HF (EF 27-25%, last echo on 2013), HTN, dementia, restless leg syndrome, chronic resp failure (on 3L [place]) and chronic pain; who presented to ED with worsening SOB and swelling. Symptoms has been present for the last 48-72 hours. Patient was seen by his cardiologist, who essentially adjust oral lasix, but recommended hospice. Patient is line up to start hospice tomorrow 4/29; but significantly symptomatic to wait at home.  In route, patient O2 sat into low 80's, while wearing his chronic oxygen supplementation. He denied CP, but endorses worsening orthopnea and inability take deep breaths. There has not been fever, chills, productive cough, sick contacts, focal deficit, dysuria, nausea, vomiting, or any other complaints.  ED Course: CXR suggesting vascular congestion and worsening right pleural effusion (modertaed to large effusion), had elevated BNP and worsening hyponatremia. Patient was started on BIPAP after discussing with family members code status and intervention; also received IV lasix and TRH called to admit patient for further evaluation and treatment.  Review of Systems:  All other systems reviewed and apart from HPI, are negative.  Past Medical History:  Diagnosis Date  . Anemia   . Aortic regurgitation    a. Echo 12/2011: moderate AR.  Marland Kitchen Aortic stenosis    a. Echo 12/2011: moderate AS.  Marland Kitchen Arthritis    Right knee  . Atrial flutter ([place])    a. s/p DCCV 2007 (on propafenone remotely).  Marland Kitchen AVM (arteriovenous malformation) of colon    a. EGD 04/2014: two tiny nonbleeding AVMs, small nonbleeding duodenal ulcer, minimal  antral erosions, minimal erosive esophagitis.  . Back fracture    s/p kyphoplasty  . Bacteremia 2013  . Blood transfusion   . Bowel obstruction ([place]) 02/2010   x 4 days  . Chronic pain   . Chronic systolic CHF (congestive heart failure) ([place])    a. Echo 12/2011: EF 20-25%, akinesis of mid-distal anteroseptal, anterior, and apical myocardium; consistent with infarction; moderate AS/AI, mod-severe MR, mod-severely dilated LA, mildly dilated RA, mod TR, PASP 34mmHg.  . Colon polyp   . Corneal disease   . Dementia   . Diverticulosis   . Duodenal ulcer    a. EGD 04/2014: two tiny nonbleeding AVMs, small nonbleeding duodenal ulcer, minimal antral erosions, minimal erosive esophagitis.  . Esophagitis    a. EGD 04/2014: two tiny nonbleeding AVMs, small nonbleeding duodenal ulcer, minimal antral erosions, minimal erosive esophagitis.  . Essential hypertension   . External hemorrhoid   . Failure to thrive (0-17)   . Gait abnormality   . Hyponatremia   . Hypothyroidism   . IBS (irritable bowel syndrome)   . Incontinence   . Internal hemorrhoid   . Lower GI bleeding 2011   a. 2005 note: slow gastrointestinal bleed, melena, and chronic anticoagulation, suspect peptic ulcer disease or erosive gastritis. b. GIB 2012.  . Macular degeneration   . Mitral regurgitation    a. Echo 12/2011: mod-severe MR.  . Orthostatic hypotension   . Pacemaker    a. s/p AV nodal ablation at [facility].  St Jude Pacemaker. Lead fracture in 2010 and required lead revision at [facility] at that time.  Marland Kitchen  Peripheral edema   . Peripheral neuropathy   . Permanent atrial fibrillation ([place])    a. Prior DCCV/rhythm control unsuccessful. b. s/p AVN ablation at [facility] and subsequent SJM Pacemaker.  . Pulmonary hypertension (HCC)    Moderate  . Restless leg syndrome   . Tricuspid regurgitation    a. Echo 12/2011: moderate TR.  . Varicose veins    Many superficial veins along with peripheral edema    Past  Surgical History:  Procedure Laterality Date  . AV NODE ABLATION     at [place]     "as a kid"  . ESOPHAGOGASTRODUODENOSCOPY (EGD) WITH PROPOFOL N/A 04/24/2014   Procedure: ESOPHAGOGASTRODUODENOSCOPY (EGD) WITH PROPOFOL;  Surgeon: Lear Ng, MD;  Location: [place];  Service: Endoscopy;  Laterality: N/A;  . EYE SURGERY     Corneal, at [facility], both layers removed.  Marland Kitchen HEMORRHOID SURGERY    . INGUINAL HERNIA REPAIR  1960's   left  . [place]   right  . KNEE ARTHROSCOPY  ~ 2000's   right  . PACEMAKER INSERTION  ~ 2006   initial placement  . PACEMAKER REVISION  05/2009   RV lead revised due to lead fracture.  performed by Dr Koleen Nimrod at [facility]  . TONSILLECTOMY      Social History  reports that he quit smoking about 45 years ago. His smoking use included Cigarettes. He has a 30.00 pack-year smoking history. He has never used smokeless tobacco. He reports that he does not drink alcohol or use drugs.  Allergies  Allergen Reactions  . Contrast Media [Iodinated Diagnostic Agents] Other (See Comments)    Unknown. Neither the patient or his wife are aware of any allergy to contrast dye.   Marland Kitchen Penicillins Hives    Has patient had a PCN reaction causing immediate rash, facial/tongue/throat swelling, SOB or lightheadedness with hypotension: Yes Has patient had a PCN reaction causing severe rash involving mucus membranes or skin necrosis: No Has patient had a PCN reaction that required hospitalization No Has patient had a PCN reaction occurring within the last 10 years: No If all of the above answers are "NO", then may proceed with Cephalosporin use.  Marland Kitchen Uroxatral [Alfuzosin Hydrochloride] Other (See Comments)    hypotension    Family History  Problem Relation Age of Onset  . Adopted: Yes  . Factor V Leiden deficiency Daughter      Prior to Admission medications   Medication Sig Start Date End Date Taking? Authorizing  Provider  AZO-CRANBERRY PO Take 1,000 mg by mouth daily.    Historical Provider, MD  Cyanocobalamin (VITAMIN B-12 PO) Take 1 tablet by mouth daily.    Historical Provider, MD  Diaper Rash Products (DESITIN) OINT Apply 1 application topically 3 (three) times daily as needed (rash).    Historical Provider, MD  donepezil (ARICEPT) 5 MG tablet Take 1 tablet (5 mg total) by mouth at bedtime. 01/17/13   Star Age, MD  ENSURE (ENSURE) Take 237 mLs by mouth daily.    Historical Provider, MD  ferrous gluconate (FERGON) 324 MG tablet Take 324 mg by mouth daily.  10/12/14   Historical Provider, MD  furosemide (LASIX) 40 MG tablet Take 1 tablet (40 mg total) by mouth daily. 10/07/16 01/05/17  Belva Crome, MD  Guaifenesin [facility] MAXIMUM STRENGTH) 1200 MG TB12 Take 600 mg by mouth 2 (two) times daily.    Historical Provider, MD  Lactase (LACTAID PO) Take 5  mg by mouth 2 (two) times daily.    Historical Provider, MD  levothyroxine (SYNTHROID, LEVOTHROID) 75 MCG tablet Take 75 mcg by mouth daily before breakfast.  01/03/13   Historical Provider, MD  lisinopril (PRINIVIL,ZESTRIL) 2.5 MG tablet TAKE 1 TABLET BY MOUTH EVERY DAY **MUST HAVE OFFICE VISIT** 06/08/16   Belva Crome, MD  LORazepam (ATIVAN) 0.5 MG tablet Take 0.5 mg by mouth every evening.    Historical Provider, MD  Melatonin 10 MG TABS Take 20 mg by mouth at bedtime.    Historical Provider, MD  memantine (NAMENDA) 10 MG tablet Take 10 mg by mouth 2 (two) times daily.    Historical Provider, MD  mirtazapine (REMERON) 15 MG tablet Take 15 mg by mouth at bedtime.    Historical Provider, MD  morphine (MS CONTIN) 15 MG 12 hr tablet Take 15 mg by mouth every 12 (twelve) hours.  01/02/13   Historical Provider, MD  Multiple Vitamins-Minerals (MENS MULTIVITAMIN PLUS PO) Take 1 tablet by mouth daily.    Historical Provider, MD  Nutritional Supplements (BOOST PO) Take 1 Container by mouth daily.    Historical Provider, MD  omeprazole (PRILOSEC) 40 MG capsule Take  40 mg by mouth daily. 06/19/14   Historical Provider, MD  tamsulosin (FLOMAX) 0.4 MG CAPS capsule Take 0.4 mg by mouth daily. 11/27/15   Historical Provider, MD  temazepam (RESTORIL) 7.5 MG capsule Take 7.5 mg by mouth at bedtime. 09/07/16   Historical Provider, MD  tiZANidine (ZANAFLEX) 2 MG tablet Take 1 tablet by mouth at bedtime. 11/19/15   Historical Provider, MD  warfarin (COUMADIN) 5 MG tablet Take 5 mg by mouth daily at 6 PM.    Historical Provider, MD    Physical Exam: Vitals:   10/09/16 1922 10/09/16 1930 10/09/16 1945 10/09/16 2000  BP: 106/65 96/60 (!) 96/58 104/62  Pulse: 76 73 76 80  Resp: 14 12 12 14   SpO2: 98% 97% 96% 95%    Constitutional: frail, underweight and chronically ills in appearance. Afebrile, denying CP and abd pain. Endorses breathing easier after lasix and BIPAP initiated. Eyes: PERTLA, lids and conjunctivae normal, no icterus ENMT: Mucous membranes were moist. Posterior pharynx clear of any exudate or lesions. No thrush Neck: supple, no masses, no thyromegaly, positive JVD Respiratory: decrease BS at bases (R > L), positive rhonchi and fine crackles. Using mildly his accessory muscles.  Cardiovascular: regular rate, no rubs, no gallops; positive Murmurs. 2-3++ edema bilaterally.   Abdomen: No distension, no tenderness, no masses palpated. No hepatosplenomegaly. Bowel sounds normal.  Musculoskeletal: no clubbing / cyanosis. No joint deformity upper and lower extremities. Good ROM, no contractures.  Skin: multiple bruises (especially on his arms), no petechiae. Neurologic: CN 2-12 grossly intact. DTR normal. Strength 4/5 in all 4 limbs (due to poor effort and deconditioning).  Psychiatric: Normal judgment and insight. Alert and oriented x 3. Normal mood.     Labs on Admission: I have personally reviewed following labs and imaging studies  CBC:  Recent Labs Lab 10/07/16 1056 10/09/16 1736  WBC 6.3 6.3  NEUTROABS  --  5.0  HGB  --  12.0*  HCT 35.9* 34.9*    MCV 95 93.3  PLT 192 277   Basic Metabolic Panel:  Recent Labs Lab 10/07/16 1056 10/09/16 1736  NA 125* 120*  K 4.7 4.1  CL 91* 91*  CO2 24 20*  GLUCOSE 209* 173*  BUN 27 28*  CREATININE 0.80 0.95  CALCIUM 8.6 8.4*   Liver  Function Tests:  Recent Labs Lab 10/09/16 1736  AST 26  ALT 19  ALKPHOS 59  BILITOT 0.6  PROT 6.6  ALBUMIN 3.5   Urine analysis:    Component Value Date/Time   COLORURINE AMBER (A) 12/24/2011 1803   APPEARANCEUR CLOUDY (A) 12/24/2011 1803   LABSPEC 1.027 12/24/2011 1803   PHURINE 5.5 12/24/2011 1803   GLUCOSEU NEGATIVE 12/24/2011 1803   HGBUR LARGE (A) 12/24/2011 1803   BILIRUBINUR NEGATIVE 12/24/2011 1803   KETONESUR 15 (A) 12/24/2011 1803   PROTEINUR 30 (A) 12/24/2011 1803   UROBILINOGEN 0.2 12/24/2011 1803   NITRITE POSITIVE (A) 12/24/2011 1803   LEUKOCYTESUR LARGE (A) 12/24/2011 1803     Radiological Exams on Admission: Dg Chest Portable 1 View  Result Date: 10/09/2016 CLINICAL DATA:  Shortness of breath and weakness for several days EXAM: PORTABLE CHEST 1 VIEW COMPARISON:  03/18/2016 FINDINGS: Cardiac shadow is enlarged but stable. A pacing device is again seen. Increasing effusions are seen right greater than left. Underlying atelectatic changes are noted as well. No bony abnormality is seen. IMPRESSION: Significant increase in bilateral pleural effusions right greater than left. Bibasilar atelectatic changes are noted. Electronically Signed   By: Inez Catalina M.D.   On: 10/09/2016 17:32    EKG:  Sinus rhythm, positive LBBB (unchanged from prior tracings); no acute ischemic changes.  Assessment/Plan 1-Acute on chronic respiratory failure ([place]): with hypoxia. Patient using 3L [place] chronically; but presented with O2 sat in low 80's while waiting chronic supplementation. Required BIPAP in ED. -cause appears to be due to exacerbation of CHF and worsening right pleural effusion  -will admit to stepdown (in order to continue using BIPAP,  at least overnight) -will start IV lasix -strict I's and O's and daily weight -will check troponin and TSH -no B-blockers or ACE, given hypotension and acute decompensation. Patient was on ACE prior to admission. -will try to arrange right thoracentesis with IR -palliative consulted -follow clinical response  -cardiology aware of admission, but will need formal consult in am.  2-acute on chronic systolic heart failure -last echo done in 2013, EF 20-25% -recent visit with his cardiologist and looking to make transition of care to hospice -will use IV lasix as mentioned above and follow clinical response  -will allow cardiology to decide if repeat echo is needed at this time  3-Pleural effusion on right -moderate to large and causing inability to fully expand his lungs (creating atelectasis and contributing to SOB) -will use IS -IR consulted for thoracentesis  -started on IV lasix  4-Weakness generalized: due to physical deconditioning and CHF exacerbation. -patient sodium also low. -will follow progress and depending response might need PT prior to discharge.  5-Restless leg syndrome/insomnia -will continue PRN Restoril   6-Hypothyroidism -will continue synthroid -will check TSH  7-GERD (gastroesophageal reflux disease) -will continue PPI  8-Hyponatremia -will follow electrolytes -patient with chronic hyponatremia, but most likely worse due to volume overload with CHF -will repeat BMET in am -checking also TSH  9-Severe protein-calorie malnutrition ([place]) -will continue feeding supplements   10-Depression with anxiety -will continue Remeron and PRN ativan -no SI or hallucinations   11-Unspecified dementia without behavioral disturbance -will continue Namenda and Aricept -continue supportive care  12-BPH -will continue flomax  13-chronic pain syndrome: from spinal stenosis -will continue chronic Ms contin dose  14-prior hx of A. Flutter /A. Fibrillation: s/p  DCCV in the past -has been on coumadin chronically  -will check INR -continue coumadin per pharmacy -CHADsVAsc score 4  Time: 70 minutes (more than 50% of time dedicated at face to face evaluation, coordination of care and discussion with patient and family member at bedside).   DVT prophylaxis: on chronic coumadin   Code Status: DNR/DNI (ok with BIPAP)  Family Communication:  Daughter at bedside  Disposition Plan:  Will admit to stepdown, try to arranged for thoracentesis, continue BIPAP and assess response. Patient wishing to go back home with hospice; but might ended requiring residential hospice. Consults called: palliative care consult and IR; will also need cardiology involvement (please call in am) Admission status: inpatient, LOS > 2 midnights, stepdown bed    Barton Dubois MD [facility] Pager [phone]  If 7PM-7AM, please contact night-coverage [email or web address] Password [email or web address]  10/09/2016, 8:31 PM

## 2016-10-09 NOTE — Progress Notes (Signed)
ANTICOAGULATION CONSULT NOTE - Initial Consult  Pharmacy Consult for Warfarin  Indication: atrial fibrillation  Patient Measurements: Height: 5\' 10"  (177.8 cm) Weight: 122 lb 12.7 oz (55.7 kg) IBW/kg (Calculated) : 73  Vital Signs: Temp: 98.4 F (36.9 C) (04/28 2153) Temp Source: Oral (04/28 2153) BP: 118/68 (04/28 2200) Pulse Rate: 70 (04/28 2200)  Labs:  Recent Labs  10/07/16 1056 10/09/16 1736  HGB  --  12.0*  HCT 35.9* 34.9*  PLT 192 184  CREATININE 0.80 0.95    Estimated Creatinine Clearance: 39.9 mL/min (by C-G formula based on SCr of 0.95 mg/dL).   Medical History: Past Medical History:  Diagnosis Date  . Anemia   . Aortic regurgitation    a. Echo 12/2011: moderate AR.  Marland Kitchen Aortic stenosis    a. Echo 12/2011: moderate AS.  Marland Kitchen Arthritis    Right knee  . Atrial flutter ([place])    a. s/p DCCV 2007 (on propafenone remotely).  Marland Kitchen AVM (arteriovenous malformation) of colon    a. EGD 04/2014: two tiny nonbleeding AVMs, small nonbleeding duodenal ulcer, minimal antral erosions, minimal erosive esophagitis.  . Back fracture    s/p kyphoplasty  . Bacteremia 2013  . Blood transfusion   . Bowel obstruction ([place]) 02/2010   x 4 days  . Chronic pain   . Chronic systolic CHF (congestive heart failure) ([place])    a. Echo 12/2011: EF 20-25%, akinesis of mid-distal anteroseptal, anterior, and apical myocardium; consistent with infarction; moderate AS/AI, mod-severe MR, mod-severely dilated LA, mildly dilated RA, mod TR, PASP 66mmHg.  . Colon polyp   . Corneal disease   . Dementia   . Diverticulosis   . Duodenal ulcer    a. EGD 04/2014: two tiny nonbleeding AVMs, small nonbleeding duodenal ulcer, minimal antral erosions, minimal erosive esophagitis.  . Esophagitis    a. EGD 04/2014: two tiny nonbleeding AVMs, small nonbleeding duodenal ulcer, minimal antral erosions, minimal erosive esophagitis.  . Essential hypertension   . External hemorrhoid   . Failure to thrive (0-17)   .  Gait abnormality   . Hyponatremia   . Hypothyroidism   . IBS (irritable bowel syndrome)   . Incontinence   . Internal hemorrhoid   . Lower GI bleeding 2011   a. 2005 note: slow gastrointestinal bleed, melena, and chronic anticoagulation, suspect peptic ulcer disease or erosive gastritis. b. GIB 2012.  . Macular degeneration   . Mitral regurgitation    a. Echo 12/2011: mod-severe MR.  . Orthostatic hypotension   . Pacemaker    a. s/p AV nodal ablation at [facility].  St Jude Pacemaker. Lead fracture in 2010 and required lead revision at [facility] at that time.  . Peripheral edema   . Peripheral neuropathy   . Permanent atrial fibrillation ([place])    a. Prior DCCV/rhythm control unsuccessful. b. s/p AVN ablation at [facility] and subsequent SJM Pacemaker.  . Pulmonary hypertension (HCC)    Moderate  . Restless leg syndrome   . Tricuspid regurgitation    a. Echo 12/2011: moderate TR.  . Varicose veins    Many superficial veins along with peripheral edema     Assessment: 83 y/o M on warfarin PTA for afib, here with shortness of breath, no INR drawn yet  Warfarin PTA dosing: 5 mg daily   Goal of Therapy:  INR 2-3 Monitor platelets by anticoagulation protocol: Yes   Plan:  -Await INR with AM labs to assess dosing needs   Narda Bonds 10/09/2016,11:45 PM

## 2016-10-09 NOTE — ED Notes (Signed)
ED Provider at bedside. 

## 2016-10-09 NOTE — Progress Notes (Signed)
Patient transported on BiPap from ED-33 to 1V-47 without complication.

## 2016-10-09 NOTE — ED Provider Notes (Signed)
[facility] DEPT Provider Note   CSN: [id number] Arrival date & time: 10/09/16  1640     History   Chief Complaint Chief Complaint  Patient presents with  . Shortness of Breath    HPI [name] is a 83 y.o. male.  The history is provided by the patient.  Shortness of Breath  This is a recurrent problem. The problem occurs continuously.The current episode started 12 to 24 hours ago. The problem has been gradually worsening. Associated symptoms include cough and leg swelling. Pertinent negatives include no fever, no headaches, no coryza, no rhinorrhea, no sore throat, no swollen glands, no ear pain, no neck pain, no sputum production, no hemoptysis, no wheezing, no chest pain, no syncope, no vomiting, no abdominal pain, no rash and no leg pain. It is unknown what precipitated the problem. Risk factors: severe CHF. He has tried nothing for the symptoms. The treatment provided no relief. He has had prior hospitalizations. Associated medical issues include heart failure.    Past Medical History:  Diagnosis Date  . Anemia   . Aortic regurgitation    a. Echo 12/2011: moderate AR.  Marland Kitchen Aortic stenosis    a. Echo 12/2011: moderate AS.  Marland Kitchen Arthritis    Right knee  . Atrial flutter ([place])    a. s/p DCCV 2007 (on propafenone remotely).  Marland Kitchen AVM (arteriovenous malformation) of colon    a. EGD 04/2014: two tiny nonbleeding AVMs, small nonbleeding duodenal ulcer, minimal antral erosions, minimal erosive esophagitis.  . Back fracture    s/p kyphoplasty  . Bacteremia 2013  . Blood transfusion   . Bowel obstruction ([place]) 02/2010   x 4 days  . Chronic pain   . Chronic systolic CHF (congestive heart failure) ([place])    a. Echo 12/2011: EF 20-25%, akinesis of mid-distal anteroseptal, anterior, and apical myocardium; consistent with infarction; moderate AS/AI, mod-severe MR, mod-severely dilated LA, mildly dilated RA, mod TR, PASP 19mmHg.  . Colon polyp   . Corneal disease   . Dementia   .  Diverticulosis   . Duodenal ulcer    a. EGD 04/2014: two tiny nonbleeding AVMs, small nonbleeding duodenal ulcer, minimal antral erosions, minimal erosive esophagitis.  . Esophagitis    a. EGD 04/2014: two tiny nonbleeding AVMs, small nonbleeding duodenal ulcer, minimal antral erosions, minimal erosive esophagitis.  . Essential hypertension   . External hemorrhoid   . Failure to thrive (0-17)   . Gait abnormality   . Hyponatremia   . Hypothyroidism   . IBS (irritable bowel syndrome)   . Incontinence   . Internal hemorrhoid   . Lower GI bleeding 2011   a. 2005 note: slow gastrointestinal bleed, melena, and chronic anticoagulation, suspect peptic ulcer disease or erosive gastritis. b. GIB 2012.  . Macular degeneration   . Mitral regurgitation    a. Echo 12/2011: mod-severe MR.  . Orthostatic hypotension   . Pacemaker    a. s/p AV nodal ablation at [facility].  St Jude Pacemaker. Lead fracture in 2010 and required lead revision at [facility] at that time.  . Peripheral edema   . Peripheral neuropathy   . Permanent atrial fibrillation ([place])    a. Prior DCCV/rhythm control unsuccessful. b. s/p AVN ablation at [facility] and subsequent SJM Pacemaker.  . Pulmonary hypertension (HCC)    Moderate  . Restless leg syndrome   . Tricuspid regurgitation    a. Echo 12/2011: moderate TR.  . Varicose veins    Many superficial veins along with  peripheral edema    Patient Active Problem List   Diagnosis Date Noted  . Acute on chronic respiratory failure ([place]) 10/09/2016  . GERD (gastroesophageal reflux disease) 10/09/2016  . Hyponatremia 10/09/2016  . Severe protein-calorie malnutrition ([place]) 10/09/2016  . Depression with anxiety 10/09/2016  . Unspecified dementia without behavioral disturbance 10/09/2016  . Essential hypertension 07/18/2014  . Peripheral neuropathy   . Gait abnormality   . Diverticulosis   . Atrial flutter ([place])   . Tricuspid regurgitation   . Mitral regurgitation    . Aortic regurgitation   . Aortic stenosis   . Esophagitis   . Duodenal ulcer   . AVM (arteriovenous malformation) of colon   . Macular degeneration   . Acute on chronic systolic CHF (congestive heart failure) ([place])   . Permanent atrial fibrillation ([place])   . Orthostatic hypotension   . Lower GI bleeding   . Anemia   . Restless leg syndrome   . Hypothyroidism   . Iron deficiency anemia 04/24/2014  . Heme positive stool 04/24/2014  . Chronic anticoagulation 11/08/2013  . Complete heart block ([place]) 09/28/2013  . Pulmonary edema 12/30/2011  . Staphylococcus aureus bacteremia 12/28/2011  . Pacemaker 12/28/2011  . Abdominal pain 12/26/2011  . Weakness generalized 12/25/2011  . UTI (lower urinary tract infection) 12/25/2011  . Spinal stenosis 07/29/2011  . Chronic pain 07/29/2011  . Pleural effusion on right 04/16/2010  . DEMENTIA 04/15/2010    Past Surgical History:  Procedure Laterality Date  . AV NODE ABLATION     at [facility]     "as a kid"  . ESOPHAGOGASTRODUODENOSCOPY (EGD) WITH PROPOFOL N/A 04/24/2014   Procedure: ESOPHAGOGASTRODUODENOSCOPY (EGD) WITH PROPOFOL;  Surgeon: Lear Ng, MD;  Location: [facility];  Service: Endoscopy;  Laterality: N/A;  . EYE SURGERY     Corneal, at [facility], both layers removed.  Marland Kitchen HEMORRHOID SURGERY    . INGUINAL HERNIA REPAIR  1960's   left  . [place]   right  . KNEE ARTHROSCOPY  ~ 2000's   right  . PACEMAKER INSERTION  ~ 2006   initial placement  . PACEMAKER REVISION  05/2009   RV lead revised due to lead fracture.  performed [facility] Dr Koleen Nimrod [facility]  . TONSILLECTOMY         Home Medications    Prior to Admission medications   Medication Sig Start Date End Date Taking? Authorizing Provider  AZO-CRANBERRY PO Take 1,000 mg by mouth daily.   Yes Historical Provider, MD  diclofenac sodium (VOLTAREN) 1 % GEL Apply 1 application topically daily as needed (pain).   Yes  Historical Provider, MD  donepezil (ARICEPT) 5 MG tablet Take 1 tablet (5 mg total) by mouth at bedtime. 01/17/13  Yes Star Age, MD  ENSURE (ENSURE) Take 237 mLs by mouth daily.   Yes Historical Provider, MD  ferrous gluconate (FERGON) 324 MG tablet Take 324 mg by mouth daily.  10/12/14  Yes Historical Provider, MD  furosemide (LASIX) 40 MG tablet Take 1 tablet (40 mg total) by mouth daily. 10/07/16 01/05/17 Yes Belva Crome, MD  guaiFENesin (MUCINEX) 600 MG 12 hr tablet Take 600 mg by mouth 2 (two) times daily.   Yes Historical Provider, MD  Lactase (LACTAID PO) Take 1 tablet by mouth 2 (two) times daily as needed (when drinking milk).    Yes Historical Provider, MD  levothyroxine (SYNTHROID, LEVOTHROID) 75 MCG tablet Take 75 mcg by mouth  daily before breakfast.  01/03/13  Yes Historical Provider, MD  lisinopril (PRINIVIL,ZESTRIL) 2.5 MG tablet TAKE 1 TABLET BY MOUTH EVERY DAY **MUST HAVE OFFICE VISIT** Patient taking differently: TAKE 1 TABLET BY MOUTH EVERY DAY WITH LUNCH **MUST HAVE OFFICE VISIT** 06/08/16  Yes Belva Crome, MD  liver oil-zinc oxide (DESITIN) 40 % ointment Apply 1 application topically 3 (three) times daily as needed for irritation.   Yes Historical Provider, MD  LORazepam (ATIVAN) 0.5 MG tablet Take 0.5 mg by mouth at bedtime.    Yes Historical Provider, MD  Melatonin 10 MG TABS Take 20 mg by mouth at bedtime.   Yes Historical Provider, MD  memantine (NAMENDA) 10 MG tablet Take 10 mg by mouth 2 (two) times daily.   Yes Historical Provider, MD  mirtazapine (REMERON) 15 MG tablet Take 15 mg by mouth at bedtime.   Yes Historical Provider, MD  morphine (MS CONTIN) 15 MG 12 hr tablet Take 15 mg by mouth every 12 (twelve) hours.  01/02/13  Yes Historical Provider, MD  Multiple Vitamin (MULTIVITAMIN WITH MINERALS) TABS tablet Take 1 tablet by mouth daily.   Yes Historical Provider, MD  omeprazole (PRILOSEC) 40 MG capsule Take 40 mg by mouth daily. 06/19/14  Yes Historical Provider, MD    tamsulosin (FLOMAX) 0.4 MG CAPS capsule Take 0.4 mg by mouth at bedtime.  11/27/15  Yes Historical Provider, MD  temazepam (RESTORIL) 7.5 MG capsule Take 7.5 mg by mouth at bedtime. 09/07/16  Yes Historical Provider, MD  tiZANidine (ZANAFLEX) 2 MG tablet Take 2 mg by mouth at bedtime.  11/19/15  Yes Historical Provider, MD  vitamin B-12 (CYANOCOBALAMIN) 1000 MCG tablet Take 1,000 mcg by mouth daily.   Yes Historical Provider, MD  warfarin (COUMADIN) 5 MG tablet Take 5 mg by mouth at bedtime.    Yes Historical Provider, MD    Family History Family History  Problem Relation Age of Onset  . Adopted: Yes  . Factor V Leiden deficiency Daughter     Social History Social History  Substance Use Topics  . Smoking status: Former Smoker    Packs/day: 2.00    Years: 15.00    Types: Cigarettes    Quit date: 06/15/1971  . Smokeless tobacco: Never Used  . Alcohol use No     Comment: 07/29/11 "used to drink some; haven't now in quite a few years"     Allergies   Contrast media [iodinated diagnostic agents]; Penicillins; and Uroxatral [alfuzosin hydrochloride]   Review of Systems Review of Systems  Constitutional: Positive for fatigue. Negative for fever.  HENT: Negative for ear pain, rhinorrhea and sore throat.   Eyes: Negative for pain.  Respiratory: Positive for cough and shortness of breath. Negative for hemoptysis, sputum production and wheezing.   Cardiovascular: Positive for leg swelling. Negative for chest pain and syncope.  Gastrointestinal: Negative for abdominal pain, nausea and vomiting.  Genitourinary: Negative for dysuria.  Musculoskeletal: Negative for neck pain.  Skin: Negative for rash.  Neurological: Positive for weakness. Negative for headaches.  Psychiatric/Behavioral: Negative for confusion.     Physical Exam Updated Vital Signs BP 118/68   Pulse 70   Temp 98.4 F (36.9 C) (Oral)   Resp 11   Ht 5\' 10"  (1.778 m)   Wt 55.7 kg   SpO2 100%   BMI 17.62 kg/m    Physical Exam  Constitutional: He appears well-developed and well-nourished. He has a sickly appearance. He appears ill.  HENT:  Head: Normocephalic and atraumatic.  Eyes: Conjunctivae and EOM are normal.  Neck: Neck supple.  Cardiovascular: Normal rate, regular rhythm and intact distal pulses.   No murmur heard. Pulmonary/Chest: Effort normal. Tachypnea noted. No respiratory distress. He has rhonchi in the right upper field, the right middle field, the right lower field, the left upper field, the left middle field and the left lower field. He has rales in the right lower field and the left lower field.  Abdominal: Soft. There is no tenderness.  Musculoskeletal: He exhibits no edema.  Neurological: He is alert. GCS eye subscore is 4. GCS verbal subscore is 5. GCS motor subscore is 6.  Skin: Skin is warm and dry.  Psychiatric: He has a normal mood and affect.  Nursing note and vitals reviewed.    ED Treatments / Results  Labs (all labs ordered are listed, but only abnormal results are displayed) Labs Reviewed  CBC WITH DIFFERENTIAL/PLATELET - Abnormal; Notable for the following:       Result Value   RBC 3.74 (*)    Hemoglobin 12.0 (*)    HCT 34.9 (*)    Lymphs Abs 0.6 (*)    All other components within normal limits  COMPREHENSIVE METABOLIC PANEL - Abnormal; Notable for the following:    Sodium 120 (*)    Chloride 91 (*)    CO2 20 (*)    Glucose, Bld 173 (*)    BUN 28 (*)    Calcium 8.4 (*)    All other components within normal limits  BRAIN NATRIURETIC PEPTIDE - Abnormal; Notable for the following:    B Natriuretic Peptide 1,165.0 (*)    All other components within normal limits  MRSA PCR SCREENING  BASIC METABOLIC PANEL  PROTIME-INR  APTT  TSH  I-STAT TROPOININ, ED  I-STAT TROPOININ, ED  I-STAT TROPOININ, ED    EKG  EKG Interpretation  Date/Time:  Saturday October 09 2016 17:59:29 EDT Ventricular Rate:  76 PR Interval:    QRS Duration: 187 QT  Interval:  470 QTC Calculation: 529 R Axis:   -82 Text Interpretation:  Sinus or ectopic atrial rhythm Prolonged PR interval Left bundle branch block No significant change since last tracing Confirmed by Maryan Rued  MD, Loree Fee ([id number]) on 10/09/2016 7:17:17 PM       Radiology Dg Chest Portable 1 View  Result Date: 10/09/2016 CLINICAL DATA:  Shortness of breath and weakness for several days EXAM: PORTABLE CHEST 1 VIEW COMPARISON:  03/18/2016 FINDINGS: Cardiac shadow is enlarged but stable. A pacing device is again seen. Increasing effusions are seen right greater than left. Underlying atelectatic changes are noted as well. No bony abnormality is seen. IMPRESSION: Significant increase in bilateral pleural effusions right greater than left. Bibasilar atelectatic changes are noted. Electronically Signed   By: Inez Catalina M.D.   On: 10/09/2016 17:32    Procedures Procedures (including critical care time)  Medications Ordered in ED Medications  feeding supplement (ENSURE ENLIVE) (ENSURE ENLIVE) liquid 237 mL (not administered)  temazepam (RESTORIL) capsule 7.5 mg (7.5 mg Oral Given 10/09/16 2330)  memantine (NAMENDA) tablet 10 mg (10 mg Oral Given 10/09/16 2330)  tamsulosin (FLOMAX) capsule 0.4 mg (not administered)  ferrous gluconate (FERGON) tablet 324 mg (not administered)  LORazepam (ATIVAN) tablet 0.5 mg (not administered)  mirtazapine (REMERON) tablet 15 mg (15 mg Oral Given 10/09/16 2300)  pantoprazole (PROTONIX) EC tablet 40 mg (not administered)  donepezil (ARICEPT) tablet 5 mg (5 mg Oral Given 10/09/16 2330)  levothyroxine (SYNTHROID, LEVOTHROID) tablet 75 mcg (not  administered)  sodium chloride flush (NS) 0.9 % injection 3 mL (3 mLs Intravenous Given 10/09/16 2331)  sodium chloride flush (NS) 0.9 % injection 3 mL (not administered)  0.9 %  sodium chloride infusion (not administered)  acetaminophen (TYLENOL) tablet 650 mg (not administered)  ondansetron (ZOFRAN) injection 4 mg (not  administered)  furosemide (LASIX) injection 30 mg (not administered)  morphine (MS CONTIN) 12 hr tablet 15 mg (15 mg Oral Given 10/09/16 2330)  furosemide (LASIX) injection 40 mg (40 mg Intravenous Given 10/09/16 1754)     Initial Impression / Assessment and Plan / ED Course  I have reviewed the triage vital signs and the nursing notes.  Pertinent labs & imaging results that were available during my care of the patient were reviewed by me and considered in my medical decision making (see chart for details).     83 year old white male with history of chronic CHF on hospice, A. fib, recurrent GI bleeding, and 3 L home oxygen dependent who presents in the setting of shortness of breath and fatigue. Patient is a DO NOT RESUSCITATE/DO NOT INTUBATE.  Per patient and family over the last day patient has had worsening shortness of breath and generalized fatigue. Denies fevers, recent sick contacts, medication noncompliance, nausea, vomiting, chest pain.  On arrival patient with tachypnea and significant rhonchi in all lung fields. Patient initially declined BiPAP in route bolus started quickly on BiPAP. Patient was hypoxemic on home 3 L with EMS. Patient afebrile and otherwise hemodynamically stable. Presentation consistent with likely CHF exacerbation available we'll obtain troponin to further violent for possible ACS. Additional obtain chest x-ray to available for possible pneumonia. We'll give patient IV Lasix at this time. Patient was briefly started on oral Lasix. Patient's blood pressure is low normal and do not believe he is appropriate for nitro drip at this time.  Chest x-ray consistent with pulmonary congestion. No signs of infection. Patient with elevated BNP. No elevation in troponin. Patient was found to be hyponatremic. Presentation consistent with likely CHF exacerbation. Additionally weakness could be related to hyponatremia. No signs of significant renal dysfunction at this time.  Had  extensive discussion with family regarding goals of care and plan for patient going forward. At this time they do not want to seek true comfort care and want patient continue on BiPAP. They would like admission to hospital for further improvement of CHF with plan to possibly be discharged home with home BiPAP or 2 hospice home or comfort care. Discussed case with hospitalist and patient will be admitted for further management as condition. Stable at time of transfer of care.  Final Clinical Impressions(s) / ED Diagnoses   Final diagnoses:  Acute on chronic congestive heart failure, unspecified heart failure type (HCC)  Elevated brain natriuretic peptide (BNP) level  Hyponatremia  Acute on chronic respiratory failure with hypoxia [facility])    New Prescriptions Current Discharge Medication List       Esaw Grandchild, MD 10/09/16 4503    Blanchie Dessert, MD 10/10/16 2234

## 2016-10-09 NOTE — Plan of Care (Signed)
Problem: Education: Goal: Knowledge of [facility] Education information/materials will improve Outcome: Progressing Discussed with the patient and family about plan of care tonight with some teach back displayed.

## 2016-10-09 NOTE — ED Notes (Addendum)
EMS IV attempt x1 in left forearm PTA

## 2016-10-10 DIAGNOSIS — I509 Heart failure, unspecified: Secondary | ICD-10-CM

## 2016-10-10 LAB — TSH: TSH: 2.779 u[IU]/mL (ref 0.350–4.500)

## 2016-10-10 LAB — PROTIME-INR
INR: 2.7
INR: 3.38
Prothrombin Time: 29.2 seconds — ABNORMAL HIGH (ref 11.4–15.2)
Prothrombin Time: 35 seconds — ABNORMAL HIGH (ref 11.4–15.2)

## 2016-10-10 LAB — BASIC METABOLIC PANEL
ANION GAP: 9 (ref 5–15)
BUN: 29 mg/dL — ABNORMAL HIGH (ref 6–20)
CALCIUM: 8.4 mg/dL — AB (ref 8.9–10.3)
CO2: 26 mmol/L (ref 22–32)
Chloride: 92 mmol/L — ABNORMAL LOW (ref 101–111)
Creatinine, Ser: 0.84 mg/dL (ref 0.61–1.24)
Glucose, Bld: 84 mg/dL (ref 65–99)
POTASSIUM: 3.5 mmol/L (ref 3.5–5.1)
Sodium: 127 mmol/L — ABNORMAL LOW (ref 135–145)

## 2016-10-10 LAB — APTT: APTT: 53 s — AB (ref 24–36)

## 2016-10-10 LAB — MRSA PCR SCREENING: MRSA BY PCR: NEGATIVE

## 2016-10-10 MED ORDER — MAGNESIUM SULFATE IN D5W 1-5 GM/100ML-% IV SOLN
1.0000 g | Freq: Once | INTRAVENOUS | Status: AC
  Administered 2016-10-10: 1 g via INTRAVENOUS
  Filled 2016-10-10: qty 100

## 2016-10-10 MED ORDER — WARFARIN SODIUM 5 MG PO TABS
5.0000 mg | ORAL_TABLET | Freq: Once | ORAL | Status: AC
  Administered 2016-10-10: 5 mg via ORAL
  Filled 2016-10-10: qty 1

## 2016-10-10 MED ORDER — IPRATROPIUM-ALBUTEROL 0.5-2.5 (3) MG/3ML IN SOLN: 3.0000 mL | RESPIRATORY_TRACT | Status: DC | PRN

## 2016-10-10 MED ORDER — NITROGLYCERIN 2 % TD OINT
0.5000 [in_us] | TOPICAL_OINTMENT | Freq: Four times a day (QID) | TRANSDERMAL | Status: DC
  Administered 2016-10-10 – 2016-10-12 (×9): 0.5 [in_us] via TOPICAL
  Filled 2016-10-10: qty 30

## 2016-10-10 MED ORDER — FUROSEMIDE 10 MG/ML IJ SOLN
10.0000 mg/h | INTRAVENOUS | Status: AC
  Administered 2016-10-10: 10 mg/h via INTRAVENOUS
  Filled 2016-10-10: qty 25

## 2016-10-10 MED ORDER — METOLAZONE 2.5 MG PO TABS
2.5000 mg | ORAL_TABLET | Freq: Once | ORAL | Status: AC
  Administered 2016-10-10: 2.5 mg via ORAL
  Filled 2016-10-10: qty 1

## 2016-10-10 MED ORDER — WARFARIN - PHARMACIST DOSING INPATIENT
Freq: Every day | Status: DC
  Administered 2016-10-10: 17:00:00

## 2016-10-10 MED ORDER — POTASSIUM CHLORIDE CRYS ER 20 MEQ PO TBCR
40.0000 meq | EXTENDED_RELEASE_TABLET | Freq: Four times a day (QID) | ORAL | Status: AC
  Administered 2016-10-10 (×2): 40 meq via ORAL
  Filled 2016-10-10 (×2): qty 2

## 2016-10-10 NOTE — Plan of Care (Signed)
Problem: Safety: Goal: Ability to remain free from injury will improve Outcome: Progressing Discussed with patient while private sitter in the room bed alarm is off at this time with some teach back displayed.

## 2016-10-10 NOTE — Progress Notes (Signed)
PROGRESS NOTE                                                                                                                                                                                                             Patient Demographics:    [name], is a 83 y.o. male, DOB - 1922/08/06, WGN:[id number]  Admit date - 10/09/2016   Admitting Physician Barton Dubois, MD  Outpatient Primary MD for the patient is Reginia Naas, MD  LOS - 1  Chief Complaint  Patient presents with  . Shortness of Breath       Brief Narrative  [name] is a 83 y/o male with PMH significant for chronic systolic HF (EF 78-46%, last echo on 2013), HTN, dementia, restless leg syndrome, chronic resp failure (on 3L [place]) and chronic pain; who presented to ED with worsening SOB and swelling, he was recently replaced on hospice by his cardiologist, he was seen first time by the hospice PA yesterday and she requested that patient be admitted to the hospital for CHF symptoms.   Subjective:    [name] today has, No headache, No chest pain, No abdominal pain - No Nausea, No new weakness tingling or numbness, No Cough - Improved shortness of breath.   Assessment  & Plan :     1.Acute on chronic hypoxic respiratory failure due to acute on chronic systolic CHF EF 96-29%. Patient follows with Dr. Daneen Schick cardiologist, he was on home hospice and then was discharged from it a while ago, he was replaced on home hospice last week.  Patient currently on BiPAP, gradually improving with diuresis which will be continued, will be placed on IV Lasix drip, Nitropaste, Zaroxolyn, continue condom catheter, continue supportive care with nebulizer treatments and oxygen as needed. Try to titrate him to nasal cannula oxygen. Note he uses 3 L nasal cannula oxygen at home. His blood pressure was too low for beta blocker or ACE/ARB, if blood pressure improves  these medications can be introduced with caution. Discussed his case with cardiology team to have nothing else to offer and they recommended hospice.  2. Pleural effusions bilateral right more than left. Due to #1 above. Treatment as above, if does not improve then ultrasound-guided thoracentesis.  3. Hyponatremia. Due to massive fluid overload, diurese and monitor.   4. Generalized weakness,  chronic pain, severe protein calorie malnutrition. Supportive care, continue protein supplementations and monitor.  5. Anxiety and depression. Continue Remeron and as needed Ativan.  6. Mild dementia. Continue Namenda and Aricept. At risk for delirium.  7. Hypothyroidism. Continue Synthroid. TSH stable.  8. BPH. On Flomax.  9. GERD. On PPI continue.    Diet : Diet clear liquid Room service appropriate? Yes; Fluid consistency: Thin    Family Communication  : Caregiver bedside  Code Status :  DNR  Disposition Plan  :  Home Hospice  Consults  : Pall care  Procedures  :    DVT Prophylaxis  :  Coumadin    Lab Results  Component Value Date   INR 3.38 10/09/2016   INR 1.53 (H) 01/05/2012   INR 1.40 01/04/2012    Lab Results  Component Value Date   PLT 184 10/09/2016    Inpatient Medications  Scheduled Meds: . donepezil  5 mg Oral QHS  . feeding supplement (ENSURE ENLIVE)  237 mL Oral BID BM  . ferrous gluconate  324 mg Oral Q breakfast  . levothyroxine  75 mcg Oral QAC breakfast  . memantine  10 mg Oral BID  . mirtazapine  15 mg Oral QHS  . morphine  15 mg Oral Q12H  . nitroGLYCERIN  0.5 inch Topical Q6H  . pantoprazole  40 mg Oral Daily  . potassium chloride  40 mEq Oral Q6H  . tamsulosin  0.4 mg Oral Daily  . temazepam  7.5 mg Oral QHS   Continuous Infusions: . furosemide (LASIX) infusion 10 mg/hr (10/10/16 0823)  . magnesium sulfate 1 - 4 g bolus IVPB     PRN Meds:.acetaminophen, ipratropium-albuterol, LORazepam, ondansetron (ZOFRAN) IV  Antibiotics  :     Anti-infectives    None         Objective:   Vitals:   10/10/16 0500 10/10/16 0600 10/10/16 0800 10/10/16 0813  BP: (!) 90/56 (!) 84/55 122/69   Pulse: 73 70 73 73  Resp: 17 15 15 15   Temp:   (!) 96.3 F (35.7 C)   TempSrc:   Axillary   SpO2:    99%  Weight: 55 kg (121 lb 4.1 oz)     Height:        Wt Readings from Last 3 Encounters:  10/10/16 55 kg (121 lb 4.1 oz)  12/24/15 47.6 kg (105 lb)  07/23/14 54 kg (119 lb)     Intake/Output Summary (Last 24 hours) at 10/10/16 0907 Last data filed at 10/10/16 0803  Gross per 24 hour  Intake              100 ml  Output             1850 ml  Net            -1750 ml     Physical Exam  Awake , No new F.N deficits, Normal affect .AT,PERRAL Supple Neck,No JVD, No cervical lymphadenopathy appriciated.  Symmetrical Chest wall movement, Good air movement bilaterally, bibasilar rales RRR,No Gallops,Rubs or new Murmurs, No Parasternal Heave +ve B.Sounds, Abd Soft, No tenderness, No organomegaly appriciated, No rebound - guarding or rigidity. No Cyanosis, Clubbing , 3+ leg edema, No new Rash or bruise      Data Review:    CBC  Recent Labs Lab 10/07/16 1056 10/09/16 1736  WBC 6.3 6.3  HGB  --  12.0*  HCT 35.9* 34.9*  PLT 192 184  MCV 95 93.3  MCH  32.5 32.1  MCHC 34.3 34.4  RDW 14.1 14.4  LYMPHSABS  --  0.6*  MONOABS  --  0.7  EOSABS  --  0.0  BASOSABS  --  0.0    Chemistries   Recent Labs Lab 10/07/16 1056 10/09/16 1736 10/10/16 0155  NA 125* 120* 127*  K 4.7 4.1 3.5  CL 91* 91* 92*  CO2 24 20* 26  GLUCOSE 209* 173* 84  BUN 27 28* 29*  CREATININE 0.80 0.95 0.84  CALCIUM 8.6 8.4* 8.4*  AST  --  26  --   ALT  --  19  --   ALKPHOS  --  59  --   BILITOT  --  0.6  --    ------------------------------------------------------------------------------------------------------------------ No results for input(s): CHOL, HDL, LDLCALC, TRIG, CHOLHDL, LDLDIRECT in the last 72 hours.  No results found  for: HGBA1C ------------------------------------------------------------------------------------------------------------------  Recent Labs  10/09/16 2354  TSH 2.779   ------------------------------------------------------------------------------------------------------------------ No results for input(s): VITAMINB12, FOLATE, FERRITIN, TIBC, IRON, RETICCTPCT in the last 72 hours.  Coagulation profile  Recent Labs Lab 10/09/16 2354  INR 3.38    No results for input(s): DDIMER in the last 72 hours.  Cardiac Enzymes No results for input(s): CKMB, TROPONINI, MYOGLOBIN in the last 168 hours.  Invalid input(s): CK ------------------------------------------------------------------------------------------------------------------    Component Value Date/Time   BNP 1,165.0 (H) 10/09/2016 1736    Micro Results Recent Results (from the past 240 hour(s))  MRSA PCR Screening     Status: None   Collection Time: 10/09/16 10:32 PM  Result Value Ref Range Status   MRSA by PCR NEGATIVE NEGATIVE Final    Comment:        The GeneXpert MRSA Assay (FDA approved for NASAL specimens only), is one component of a comprehensive MRSA colonization surveillance program. It is not intended to diagnose MRSA infection nor to guide or monitor treatment for MRSA infections.     Radiology Reports Dg Chest Portable 1 View  Result Date: 10/09/2016 CLINICAL DATA:  Shortness of breath and weakness for several days EXAM: PORTABLE CHEST 1 VIEW COMPARISON:  03/18/2016 FINDINGS: Cardiac shadow is enlarged but stable. A pacing device is again seen. Increasing effusions are seen right greater than left. Underlying atelectatic changes are noted as well. No bony abnormality is seen. IMPRESSION: Significant increase in bilateral pleural effusions right greater than left. Bibasilar atelectatic changes are noted. Electronically Signed   By: Inez Catalina M.D.   On: 10/09/2016 17:32    Time Spent in minutes   30   Lala Lund M.D on 10/10/2016 at 9:07 AM  Between 7am to 7pm - Pager - [phone] ( page via [email or web address], text pages only, please mention full 10 digit call back number). After 7pm go to [email or web address] - password [facility]

## 2016-10-10 NOTE — Progress Notes (Signed)
ANTICOAGULATION CONSULT NOTE  Pharmacy Consult for Warfarin  Indication: atrial fibrillation  Patient Measurements: Height: 5\' 10"  (177.8 cm) Weight: 121 lb 4.1 oz (55 kg) IBW/kg (Calculated) : 73  Vital Signs: Temp: 97.6 F (36.4 C) (04/29 1200) Temp Source: Oral (04/29 1200) BP: 93/53 (04/29 1200) Pulse Rate: 76 (04/29 1200)  Labs:  Recent Labs  10/09/16 1736 10/09/16 2354 10/10/16 0155 10/10/16 1118  HGB 12.0*  --   --   --   HCT 34.9*  --   --   --   PLT 184  --   --   --   APTT  --  53*  --   --   LABPROT  --  35.0*  --  29.2*  INR  --  3.38  --  2.70  CREATININE 0.95  --  0.84  --     Estimated Creatinine Clearance: 44.6 mL/min (by C-G formula based on SCr of 0.84 mg/dL).   Assessment: 83 y/o male admitted with worsening SOB, leg swelling and orthopnea. He takes chronic warfarin for Afib. INR down to 2.7 << 3.38, missed dose on 4/28. No bleeding noted, CBC stable.   Warfarin PTA dosing: 5 mg daily   Goal of Therapy:  INR 2-3 Monitor platelets by anticoagulation protocol: Yes   Plan:  -Warfarin 5 mg PO tonight -Daily INR -Monitor for s/sx of bleeding -Plan is for home with hospice tomorrow   Renold Genta, PharmD, BCPS Clinical Pharmacist Phone for today - [place] - [phone] 10/10/2016 1:01 PM

## 2016-10-10 NOTE — Progress Notes (Signed)
Orthopedic Tech Progress Note Patient Details:  [name] May 25, 1923 [id number]  Ortho Devices Type of Ortho Device: Louretta Parma boot Ortho Device/Splint Location: bilateral Ortho Device/Splint Interventions: Application   Hildred Priest 10/10/2016, 8:59 AM

## 2016-10-10 NOTE — Consult Note (Signed)
Consultation Note Date: 10/10/2016   Patient Name: [name]  DOB: 1922-11-08  MRN: [id number]  Age / Sex: 83 y.o., male  PCP: Edward Ada, MD Referring Physician: Thurnell Lose, MD  Reason for Consultation: Disposition, Establishing goals of care, Hospice Evaluation and Non pain symptom management  HPI/Patient Profile: [name] is a 83 yo gentleman with Advanced heart failure, vascular dementia, failure to thrive and has had progressive disease which for the past few months has caused him to be bedbound. His activities of daily living and functional status are severely limited by his heart failure and persistent dyspnea and weakness. I first met [name] back in 2013, which was the time of his initial palliative care consult - he has since that time been on hospice care on 3 different certification periods. His care is a 4 privately and he has 24 7 in-home caregivers. His wife is also declining with multiple chronic diseases and is severely limited in her ability to care for him. He has 3 daughters who help him with his care.    Clinical Assessment and Goals of Care: [name] has very specific goals of care previously had stated: DNR, Primary comfort focus and he did not want to be hospitalized- however his symptoms had progressed to the point where he felt like he wanted to come to the hospital for symptom relief-he was evaluated by hospice nurse the day before admission who sent him to the ER based on his stated goals and symptom severity.   Patient and Family Goals:  Very short and focused hospital admission for diuresis  No mechanical ventilation or BiPAP, oxygen support as tolerated  Use comfort measures in addition to treatment of the volume overload including use of morphine for dyspnea he was on MS Contin twice a day at home prior to admission.  SUMMARY OF RECOMMENDATIONS    Discharge home  with hospice tomorrow morning notify the hospice team the patient is being discharged  I have spoken with his daughter about private duty in home caregivers as she has struggled to have 24 7 care covered with her current home provider  Increase his amount of daily Lasix and monitor as much as possible at home he is now been bedbound for the past few months therefore it is difficult to weigh him since he cannot bear weight he also is unable to use a urinal or measure his urine output. He will need clinical assessment frequently in order to determine if diuresis is sufficient.  I prepared his daughter that this is a definite change and decline in his condition and he may have a very short life expectancy from this point forward-goals remain full comfort.  Oral Lasix regimen at home monitored by hospice nurses. Increase morphine dose- he may have developed tolerance.  Avoid rehospitalization.  Code Status/Advance Care Planning:  DNR    Symptom Management:   Morphine MS Contin 15 BID  Roxanol '20mg'$ /ml - q2 PRN dyspnea  Palliative Prophylaxis:   Aspiration, Bowel Regimen, Delirium  Protocol, Frequent Pain Assessment, Oral Care and Turn Reposition  Additional Recommendations (Limitations, Scope, Preferences):  Avoid Hospitalization and Minimize Medications  Psycho-social/Spiritual:   Desire for further Chaplaincy support:yes  Additional Recommendations: Education on Hospice  Prognosis:   < 4 weeks  Discharge Planning: Home with Hospice      Primary Diagnoses: Present on Admission: . Acute on chronic respiratory failure ([place]) . Restless leg syndrome . Pleural effusion on right . Hypothyroidism . GERD (gastroesophageal reflux disease) . Hyponatremia . Depression with anxiety . Unspecified dementia without behavioral disturbance   I have reviewed the medical record, interviewed the patient and family, and examined the patient. The following aspects are  pertinent.  Past Medical History:  Diagnosis Date  . Anemia   . Aortic regurgitation    a. Echo 12/2011: moderate AR.  Marland Kitchen Aortic stenosis    a. Echo 12/2011: moderate AS.  Marland Kitchen Arthritis    Right knee  . Atrial flutter ([place])    a. s/p DCCV 2007 (on propafenone remotely).  Marland Kitchen AVM (arteriovenous malformation) of colon    a. EGD 04/2014: two tiny nonbleeding AVMs, small nonbleeding duodenal ulcer, minimal antral erosions, minimal erosive esophagitis.  . Back fracture    s/p kyphoplasty  . Bacteremia 2013  . Blood transfusion   . Bowel obstruction ([place]) 02/2010   x 4 days  . Chronic pain   . Chronic systolic CHF (congestive heart failure) ([place])    a. Echo 12/2011: EF 20-25%, akinesis of mid-distal anteroseptal, anterior, and apical myocardium; consistent with infarction; moderate AS/AI, mod-severe MR, mod-severely dilated LA, mildly dilated RA, mod TR, PASP 68mHg.  . Colon polyp   . Corneal disease   . Dementia   . Diverticulosis   . Duodenal ulcer    a. EGD 04/2014: two tiny nonbleeding AVMs, small nonbleeding duodenal ulcer, minimal antral erosions, minimal erosive esophagitis.  . Esophagitis    a. EGD 04/2014: two tiny nonbleeding AVMs, small nonbleeding duodenal ulcer, minimal antral erosions, minimal erosive esophagitis.  . Essential hypertension   . External hemorrhoid   . Failure to thrive (0-17)   . Gait abnormality   . Hyponatremia   . Hypothyroidism   . IBS (irritable bowel syndrome)   . Incontinence   . Internal hemorrhoid   . Lower GI bleeding 2011   a. 2005 note: slow gastrointestinal bleed, melena, and chronic anticoagulation, suspect peptic ulcer disease or erosive gastritis. b. GIB 2012.  . Macular degeneration   . Mitral regurgitation    a. Echo 12/2011: mod-severe MR.  . Orthostatic hypotension   . Pacemaker    [facility]. [facility] 2010 [facility]  .  Peripheral edema   . Peripheral neuropathy   . Permanent atrial fibrillation ([place]    a. Prior DCCV/rhythm control unsuccessful. [facility]  . Pulmonary hypertension ([facility])    Moderate  . Restless leg syndrome   . Tricuspid regurgitation    a. Echo 12/2011: moderate TR.  . Varicose veins    Many superficial veins along with peripheral edema   Social History   Social History  . Marital status: Married    Spouse name: N/A  . Number of children: N/A  . Years of education: N/A   Social History Main Topics  . Smoking status: Former Smoker    Packs/day: 2.00    Years: 15.00  Types: Cigarettes    Quit date: 06/15/1971  . Smokeless tobacco: Never Used  . Alcohol use No     Comment: 07/29/11 "used to drink some; haven't now in quite a few years"  . Drug use: No  . Sexual activity: Not Currently    Birth control/ protection: None   Other Topics Concern  . 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   Has several patents.  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   Family History  Problem Relation Age of Onset  . Adopted: Yes  . Factor V Leiden deficiency Daughter    Scheduled Meds: . donepezil  5 mg Oral QHS  . feeding supplement (ENSURE ENLIVE)  237 mL Oral BID BM  . ferrous gluconate  324 mg Oral Q breakfast  . levothyroxine  75 mcg Oral QAC breakfast  . memantine  10 mg Oral BID  . mirtazapine  15 mg Oral QHS  . morphine  15 mg Oral Q12H  . nitroGLYCERIN  0.5 inch Topical Q6H  . pantoprazole  40 mg Oral Daily  . potassium chloride  40 mEq Oral Q6H  . tamsulosin  0.4 mg Oral Daily  . temazepam  7.5 mg Oral QHS   Continuous Infusions: . furosemide (LASIX) infusion 10 mg/hr (10/10/16 0823)   PRN Meds:.acetaminophen, ipratropium-albuterol, LORazepam, ondansetron (ZOFRAN) IV Medications Prior to Admission:  Prior to Admission medications   Medication Sig Start Date End Date  Taking? Authorizing Provider  AZO-CRANBERRY PO Take 1,000 mg by mouth daily.   Yes Historical Provider, MD  diclofenac sodium (VOLTAREN) 1 % GEL Apply 1 application topically daily as needed (pain).   Yes Historical Provider, MD  donepezil (ARICEPT) 5 MG tablet Take 1 tablet (5 mg total) by mouth at bedtime. 01/17/13  Yes Star Age, MD  ENSURE (ENSURE) Take 237 mLs by mouth daily.   Yes Historical Provider, MD  ferrous gluconate (FERGON) 324 MG tablet Take 324 mg by mouth daily.  10/12/14  Yes Historical Provider, MD  furosemide (LASIX) 40 MG tablet Take 1 tablet (40 mg total) by mouth daily. 10/07/16 01/05/17 Yes Belva Crome, MD  guaiFENesin (MUCINEX) 600 MG 12 hr tablet Take 600 mg by mouth 2 (two) times daily.   Yes Historical Provider, MD  Lactase (LACTAID PO) Take 1 tablet by mouth 2 (two) times daily as needed (when drinking milk).    Yes Historical Provider, MD  levothyroxine (SYNTHROID, LEVOTHROID) 75 MCG tablet Take 75 mcg by mouth daily before breakfast.  01/03/13  Yes Historical Provider, MD  lisinopril (PRINIVIL,ZESTRIL) 2.5 MG tablet TAKE 1 TABLET BY MOUTH EVERY DAY **MUST HAVE OFFICE VISIT** Patient taking differently: TAKE 1 TABLET BY MOUTH EVERY DAY WITH LUNCH **MUST HAVE OFFICE VISIT** 06/08/16  Yes Belva Crome, MD  liver oil-zinc oxide (DESITIN) 40 % ointment Apply 1 application topically 3 (three) times daily as needed for irritation.   Yes Historical Provider, MD  LORazepam (ATIVAN) 0.5 MG tablet Take 0.5 mg by mouth at bedtime.    Yes Historical Provider, MD  Melatonin 10 MG TABS Take 20 mg by mouth at bedtime.   Yes Historical Provider, MD  memantine (NAMENDA) 10 MG tablet Take 10 mg by mouth 2 (two) times daily.   Yes Historical Provider, MD  mirtazapine (REMERON) 15 MG tablet Take 15 mg by mouth at bedtime.   Yes Historical Provider, MD  morphine (MS CONTIN) 15 MG 12 hr tablet Take 15  mg by mouth every 12 (twelve) hours.  01/02/13  Yes Historical Provider, MD  Multiple  Vitamin (MULTIVITAMIN WITH MINERALS) TABS tablet Take 1 tablet by mouth daily.   Yes Historical Provider, MD  omeprazole (PRILOSEC) 40 MG capsule Take 40 mg by mouth daily. 06/19/14  Yes Historical Provider, MD  tamsulosin (FLOMAX) 0.4 MG CAPS capsule Take 0.4 mg by mouth at bedtime.  11/27/15  Yes Historical Provider, MD  temazepam (RESTORIL) 7.5 MG capsule Take 7.5 mg by mouth at bedtime. 09/07/16  Yes Historical Provider, MD  tiZANidine (ZANAFLEX) 2 MG tablet Take 2 mg by mouth at bedtime.  11/19/15  Yes Historical Provider, MD  vitamin B-12 (CYANOCOBALAMIN) 1000 MCG tablet Take 1,000 mcg by mouth daily.   Yes Historical Provider, MD  warfarin (COUMADIN) 5 MG tablet Take 5 mg by mouth at bedtime.    Yes Historical Provider, MD   Allergies  Allergen Reactions  . Contrast Media [Iodinated Diagnostic Agents] Other (See Comments)    Unknown. Neither the patient or his wife are aware of any allergy to contrast dye.   Marland Kitchen Penicillins Hives    Has patient had a PCN reaction causing immediate rash, facial/tongue/throat swelling, SOB or lightheadedness with hypotension: Yes Has patient had a PCN reaction causing severe rash involving mucus membranes or skin necrosis: No Has patient had a PCN reaction that required hospitalization No Has patient had a PCN reaction occurring within the last 10 years: No If all of the above answers are "NO", then may proceed with Cephalosporin use.  Marland Kitchen Uroxatral [Alfuzosin Hydrochloride] Other (See Comments)    hypotension   Review of Systems  Physical Exam  Vital Signs: BP 122/69   Pulse 73   Temp (!) 96.3 F (35.7 C) (Axillary)   Resp 15   Ht '5\' 10"'$  (1.778 m)   Wt 55 kg (121 lb 4.1 oz)   SpO2 99%   BMI 17.40 kg/m  Pain Assessment: No/denies pain   Pain Score: 0-No pain   SpO2: SpO2: 99 % O2 Device:SpO2: 99 % O2 Flow Rate: .O2 Flow Rate (L/min): 4 L/min  IO: Intake/output summary:  Intake/Output Summary (Last 24 hours) at 10/10/16 1142 Last data filed at  10/10/16 0941  Gross per 24 hour  Intake              577 ml  Output             2300 ml  Net            -1723 ml    LBM: Last BM Date: 10/09/16 Baseline Weight: Weight: 55.7 kg (122 lb 12.7 oz) Most recent weight: Weight: 55 kg (121 lb 4.1 oz)     Palliative Assessment/Data:   Flowsheet Rows     Most Recent Value  Intake Tab  Referral Department  Hospitalist  Unit at Time of Referral  Intermediate Care Unit  Palliative Care Primary Diagnosis  Cardiac  Date Notified  10/09/16  Palliative Care Type  Return patient Palliative Care  Reason for referral  Clarify Goals of Care, Non-pain Symptom, Counsel Regarding Hospice  Date of Admission  10/09/16  Date first seen by Palliative Care  10/10/16  # of days Palliative referral response time  1 Day(s)  # of days IP prior to Palliative referral  0  Clinical Assessment  Palliative Performance Scale Score  20%  Pain Max last 24 hours  6  Pain Min Last 24 hours  0  Dyspnea Max Last 24 Hours  10  Dyspnea Min Last 24 hours  4  Nausea Max Last 24 Hours  0  Nausea Min Last 24 Hours  0  Anxiety Max Last 24 Hours  5  Anxiety Min Last 24 Hours  1  Other Max Last 24 Hours  0  Psychosocial & Spiritual Assessment  Palliative Care Outcomes  Patient/Family meeting held?  No  Palliative Care Outcomes  Counseled regarding hospice, Improved non-pain symptom therapy, Clarified goals of care  Patient/Family wishes: Interventions discontinued/not started   BiPAP      Time In: 10:30AM Time Out: 11:40AM Time Total: 70 minutes Greater than 50%  of this time was spent counseling and coordinating care related to the above assessment and plan.  Signed by: Lane Hacker, DO   Please contact Palliative Medicine Team phone at [phone] for questions and concerns.  For individual provider: See Shea Evans

## 2016-10-11 ENCOUNTER — Inpatient Hospital Stay (HOSPITAL_COMMUNITY): Payer: Medicare Other

## 2016-10-11 LAB — BASIC METABOLIC PANEL
ANION GAP: 11 (ref 5–15)
BUN: 26 mg/dL — ABNORMAL HIGH (ref 6–20)
CALCIUM: 8.2 mg/dL — AB (ref 8.9–10.3)
CO2: 27 mmol/L (ref 22–32)
Chloride: 89 mmol/L — ABNORMAL LOW (ref 101–111)
Creatinine, Ser: 0.79 mg/dL (ref 0.61–1.24)
Glucose, Bld: 81 mg/dL (ref 65–99)
Potassium: 5.3 mmol/L — ABNORMAL HIGH (ref 3.5–5.1)
Sodium: 127 mmol/L — ABNORMAL LOW (ref 135–145)

## 2016-10-11 LAB — MAGNESIUM: Magnesium: 1.9 mg/dL (ref 1.7–2.4)

## 2016-10-11 LAB — PROTIME-INR
INR: 2.46
PROTHROMBIN TIME: 27.1 s — AB (ref 11.4–15.2)

## 2016-10-11 MED ORDER — METOLAZONE 2.5 MG PO TABS
2.5000 mg | ORAL_TABLET | Freq: Once | ORAL | Status: AC
  Administered 2016-10-11: 2.5 mg via ORAL
  Filled 2016-10-11: qty 1

## 2016-10-11 MED ORDER — WARFARIN SODIUM 5 MG PO TABS
5.0000 mg | ORAL_TABLET | Freq: Once | ORAL | Status: AC
  Administered 2016-10-11: 5 mg via ORAL
  Filled 2016-10-11: qty 1

## 2016-10-11 MED ORDER — FUROSEMIDE 10 MG/ML IJ SOLN
10.0000 mg/h | INTRAVENOUS | Status: AC
  Administered 2016-10-11: 10 mg/h via INTRAVENOUS
  Filled 2016-10-11: qty 25

## 2016-10-11 NOTE — Progress Notes (Signed)
PROGRESS NOTE                                                                                                                                                                                                             Patient Demographics:    [name], is a 83 y.o. male, DOB - [birth year], VOJ:[id number]  Admit date - 10/09/2016   Admitting Physician Barton Dubois, MD  Outpatient Primary MD for the patient is Reginia Naas, MD  LOS - 2  Chief Complaint  Patient presents with  . Shortness of Breath       Brief Narrative  [name] is a 83 y/o male with PMH significant for chronic systolic HF (EF 99-37%, last echo on 2013), HTN, dementia, restless leg syndrome, chronic resp failure (on 3L [place]) and chronic pain; who presented to ED with worsening SOB and swelling, he was recently replaced on hospice by his cardiologist, he was seen first time by the hospice PA yesterday and she requested that patient be admitted to the hospital for CHF symptoms.   Subjective:   Patient in bed, appears comfortable, denies any headache, no fever, no chest pain or pressure, no shortness of breath , no abdominal pain. No focal weakness.    Assessment  & Plan :     1.Acute on chronic hypoxic respiratory failure due to acute on chronic systolic CHF EF 16-96%. Patient follows with Dr. Daneen Schick cardiologist, he was on home hospice and then was discharged from it a while ago, he was replaced on home hospice last week.  Patient Has gradually improved and now off BiPAP to nasal cannula oxygen which she uses at home at 3 L, still has massive fluid overload on exam and will be continued on IV Lasix drip, Nitropaste, Zaroxolyn, continue condom catheter, continue supportive care with nebulizer treatments and oxygen as needed. Try to titrate him to nasal cannula oxygen. His blood pressure was too low for beta blocker or ACE/ARB. Discussed  his case with cardiology team to have nothing else to offer and they recommended hospice. So far 6.5 L negative, weight 118 pounds. If clinically better likely discharged with home hospice on Tuesday.  2. Pleural effusions bilateral right more than left. Due to #1 above. Treatment as above, if does not improve then ultrasound-guided thoracentesis.  3. Hyponatremia. Due to  massive fluid overload, diurese and monitor.   4. Generalized weakness, chronic pain, severe protein calorie malnutrition. Supportive care, continue protein supplementations and monitor.  5. Anxiety and depression. Continue Remeron and as needed Ativan.  6. Mild dementia. Continue Namenda and Aricept. At risk for delirium.  7. Hypothyroidism. Continue Synthroid. TSH stable.  8. BPH. On Flomax.  9. GERD. On PPI continue.  10. Hyperkalemia. Hold oral potassium and continue diuresis.    Diet : DIET SOFT Room service appropriate? Yes; Fluid consistency: Thin    Family Communication  : Caregiver bedside  Code Status :  DNR  Disposition Plan  :  Home Hospice likely discharge on Tuesday  Consults  : Pall care  Procedures  :    DVT Prophylaxis  :  Coumadin    Lab Results  Component Value Date   INR 2.46 10/11/2016   INR 2.70 10/10/2016   INR 3.38 10/09/2016    Lab Results  Component Value Date   PLT 184 10/09/2016    Inpatient Medications  Scheduled Meds: . donepezil  5 mg Oral QHS  . feeding supplement (ENSURE ENLIVE)  237 mL Oral BID BM  . ferrous gluconate  324 mg Oral Q breakfast  . levothyroxine  75 mcg Oral QAC breakfast  . memantine  10 mg Oral BID  . mirtazapine  15 mg Oral QHS  . morphine  15 mg Oral Q12H  . nitroGLYCERIN  0.5 inch Topical Q6H  . pantoprazole  40 mg Oral Daily  . tamsulosin  0.4 mg Oral Daily  . temazepam  7.5 mg Oral QHS  . Warfarin - Pharmacist Dosing Inpatient   Does not apply q1800   Continuous Infusions: . furosemide (LASIX) infusion 10 mg/hr (10/11/16 0835)    PRN Meds:.acetaminophen, ipratropium-albuterol, LORazepam, ondansetron (ZOFRAN) IV  Antibiotics  :    Anti-infectives    None         Objective:   Vitals:   10/11/16 0300 10/11/16 0400 10/11/16 0500 10/11/16 0926  BP: (!) 90/56 (!) 100/49 (!) 101/47 (!) 90/41  Pulse: 80 76 76 76  Resp: (!) 26 14 (!) 9 19  Temp: 98.2 F (36.8 C)   97.9 F (36.6 C)  TempSrc: Oral   Axillary  SpO2:    98%  Weight: 53.7 kg (118 lb 6.2 oz)     Height:        Wt Readings from Last 3 Encounters:  10/11/16 53.7 kg (118 lb 6.2 oz)  12/24/15 47.6 kg (105 lb)  07/23/14 54 kg (119 lb)     Intake/Output Summary (Last 24 hours) at 10/11/16 0942 Last data filed at 10/11/16 0928  Gross per 24 hour  Intake          1406.17 ml  Output             6100 ml  Net         -4693.83 ml     Physical Exam  Awake, No new F.N deficits, Normal affect [name].AT,PERRAL Supple Neck,No JVD, No cervical lymphadenopathy appriciated.  Symmetrical Chest wall movement, Good air movement bilaterally, Few rales RRR,No Gallops,Rubs or new Murmurs, No Parasternal Heave +ve B.Sounds, Abd Soft, No tenderness, No organomegaly appriciated, No rebound - guarding or rigidity. No Cyanosis, Clubbing , 2+ leg edema with UNNA boots, No new Rash or bruise      Data Review:    CBC  Recent Labs Lab 10/07/16 1056 10/09/16 1736  WBC 6.3 6.3  HGB  --  12.0*  HCT 35.9* 34.9*  PLT 192 184  MCV 95 93.3  MCH 32.5 32.1  MCHC 34.3 34.4  RDW 14.1 14.4  LYMPHSABS  --  0.6*  MONOABS  --  0.7  EOSABS  --  0.0  BASOSABS  --  0.0    Chemistries   Recent Labs Lab 10/07/16 1056 10/09/16 1736 10/10/16 0155 10/11/16 0213  NA 125* 120* 127* 127*  K 4.7 4.1 3.5 5.3*  CL 91* 91* 92* 89*  CO2 24 20* 26 27  GLUCOSE 209* 173* 84 81  BUN 27 28* 29* 26*  CREATININE 0.80 0.95 0.84 0.79  CALCIUM 8.6 8.4* 8.4* 8.2*  MG  --   --   --  1.9  AST  --  26  --   --   ALT  --  19  --   --   ALKPHOS  --  59  --   --   BILITOT  --   0.6  --   --    ------------------------------------------------------------------------------------------------------------------ No results for input(s): CHOL, HDL, LDLCALC, TRIG, CHOLHDL, LDLDIRECT in the last 72 hours.  No results found for: HGBA1C ------------------------------------------------------------------------------------------------------------------  Recent Labs  10/09/16 2354  TSH 2.779   ------------------------------------------------------------------------------------------------------------------ No results for input(s): VITAMINB12, FOLATE, FERRITIN, TIBC, IRON, RETICCTPCT in the last 72 hours.  Coagulation profile  Recent Labs Lab 10/09/16 2354 10/10/16 1118 10/11/16 0213  INR 3.38 2.70 2.46    No results for input(s): DDIMER in the last 72 hours.  Cardiac Enzymes No results for input(s): CKMB, TROPONINI, MYOGLOBIN in the last 168 hours.  Invalid input(s): CK ------------------------------------------------------------------------------------------------------------------    Component Value Date/Time   BNP 1,165.0 (H) 10/09/2016 1736    Micro Results Recent Results (from the past 240 hour(s))  MRSA PCR Screening     Status: None   Collection Time: 10/09/16 10:32 PM  Result Value Ref Range Status   MRSA by PCR NEGATIVE NEGATIVE Final    Comment:        The GeneXpert MRSA Assay (FDA approved for NASAL specimens only), is one component of a comprehensive MRSA colonization surveillance program. It is not intended to diagnose MRSA infection nor to guide or monitor treatment for MRSA infections.     Radiology Reports Dg Chest Port 1 View  Result Date: 10/11/2016 CLINICAL DATA:  Shortness of breath. EXAM: PORTABLE CHEST 1 VIEW COMPARISON:  10/09/2016 .  03/18/2016.  11/13/2014 FINDINGS: Cardiac pacer with lead tips projected over the right ventricle. Cardiomegaly. Interim improvement of bilateral pulmonary infiltrates/edema. Persistent but  improved bilateral pleural effusions. Findings suggest improving CHF. No pneumothorax. IMPRESSION: 1. Interim improvement of bilateral pulmonary infiltrates/edema. Persistent but improved bilateral pleural effusions, right side greater than left. Findings suggest improving CHF. 2. Persistent cardiomegaly. Cardiac pacer noted with lead tip in the right atrium . Electronically Signed   By: Marcello Moores  Register   On: 10/11/2016 07:25   Dg Chest Portable 1 View  Result Date: 10/09/2016 CLINICAL DATA:  Shortness of breath and weakness for several days EXAM: PORTABLE CHEST 1 VIEW COMPARISON:  03/18/2016 FINDINGS: Cardiac shadow is enlarged but stable. A pacing device is again seen. Increasing effusions are seen right greater than left. Underlying atelectatic changes are noted as well. No bony abnormality is seen. IMPRESSION: Significant increase in bilateral pleural effusions right greater than left. Bibasilar atelectatic changes are noted. Electronically Signed   By: Inez Catalina M.D.   On: 10/09/2016 17:32    Time Spent in minutes  30  Lala Lund M.D on 10/11/2016 at 9:42 AM  Between 7am to 7pm - Pager - [phone] ( page via [email or web address], text pages only, please mention full 10 digit call back number). After 7pm go to [email or web address] - password [facility]

## 2016-10-11 NOTE — Telephone Encounter (Signed)
We can offer IV lasix via ER. Although nothing wrong with sedation and conservative management by Hospice at home.

## 2016-10-11 NOTE — Progress Notes (Signed)
Pt arrived on floor from 4E via bed. Private sitter at bedside.

## 2016-10-11 NOTE — Care Management Note (Addendum)
Case Management Note  Patient Details  Name: [name] MRN: [id number] Date of Birth: [birth year]  Subjective/Objective:  From home with spouse, presents with CHF, resp failure,was on bipap, but now off, Pl effusiong , hyponatremia, anxiety and depression, weakness, dementia , hypothyroidism bph.  NCM received a call from [facility] with [facility], she states patient was going to go home over the weekend with home hospice but did not. Patient has now transferred to the floor.  [name] , patient's daughter is POA [phone], [name] states she would like [facility] to transport patient by 1 pm tomorrow if plan is to still dc tomorrow.  Ambulance form will be on chart address confirmed with [place] , [address] [id number] (in [place]) .                Action/Plan: Home with Hospice 5/1, NCM will follow for dc needs.   Expected Discharge Date:  10/11/16               Expected Discharge Plan:  Home w Hospice Care  In-House Referral:     Discharge planning Services  CM Consult  Post Acute Care Choice:    Choice offered to:     DME Arranged:    DME Agency:     HH Arranged:  RN [place] Agency:  [place]  Status of Service:  Completed, signed off  If discussed at [place] of Stay Meetings, dates discussed:    Additional Comments:  Zenon Mayo, RN 10/11/2016, 11:11 AM

## 2016-10-11 NOTE — Progress Notes (Signed)
ANTICOAGULATION CONSULT NOTE  Pharmacy Consult for Warfarin  Indication: atrial fibrillation  Patient Measurements: Height: 5\' 10"  (177.8 cm) Weight: 118 lb 6.2 oz (53.7 kg) IBW/kg (Calculated) : 73  Vital Signs: Temp: 97.9 F (36.6 C) (04/30 0926) Temp Source: Axillary (04/30 0926) BP: 90/41 (04/30 0926) Pulse Rate: 76 (04/30 0926)  Labs:  Recent Labs  10/09/16 1736 10/09/16 2354 10/10/16 0155 10/10/16 1118 10/11/16 0213  HGB 12.0*  --   --   --   --   HCT 34.9*  --   --   --   --   PLT 184  --   --   --   --   APTT  --  53*  --   --   --   LABPROT  --  35.0*  --  29.2* 27.1*  INR  --  3.38  --  2.70 2.46  CREATININE 0.95  --  0.84  --  0.79    Estimated Creatinine Clearance: 45.7 mL/min (by C-G formula based on SCr of 0.79 mg/dL).   Assessment: 83 y/o male admitted with worsening SOB, leg swelling and orthopnea.  Anticoag: warfarin for Afib, INR 2.46 PTA regimen: 5 mg/d  Renal: SCr 0.79  Warfarin PTA dosing: 5 mg daily   Goal of Therapy:  INR 2-3 Monitor platelets by anticoagulation protocol: Yes   Plan:  -Home with hospice tomorrow -Warfarin 5 mg PO tonight -Daily INR  Levester Fresh, PharmD, BCPS, BCCCP Clinical Pharmacist Clinical phone for 10/11/2016 from 7a-3:30p: [phone] If after 3:30p, please call main pharmacy at: [phone] 10/11/2016 10:24 AM

## 2016-10-12 DIAGNOSIS — I509 Heart failure, unspecified: Secondary | ICD-10-CM

## 2016-10-12 LAB — PROTIME-INR
INR: 2.42
PROTHROMBIN TIME: 26.8 s — AB (ref 11.4–15.2)

## 2016-10-12 LAB — BASIC METABOLIC PANEL
Anion gap: 9 (ref 5–15)
BUN: 36 mg/dL — AB (ref 6–20)
CO2: 38 mmol/L — ABNORMAL HIGH (ref 22–32)
Calcium: 8.8 mg/dL — ABNORMAL LOW (ref 8.9–10.3)
Chloride: 86 mmol/L — ABNORMAL LOW (ref 101–111)
Creatinine, Ser: 1.12 mg/dL (ref 0.61–1.24)
GFR calc Af Amer: >60 mL/min (ref >60)
GFR, EST NON AFRICAN AMERICAN: 55 mL/min — AB (ref >60)
Glucose, Bld: 88 mg/dL (ref 65–99)
POTASSIUM: 3.2 mmol/L — AB (ref 3.5–5.1)
SODIUM: 133 mmol/L — AB (ref 135–145)

## 2016-10-12 MED ORDER — FUROSEMIDE 40 MG PO TABS: 80.0000 mg | ORAL_TABLET | Freq: Two times a day (BID) | ORAL | 0 refills | Status: DC

## 2016-10-12 MED ORDER — POTASSIUM CHLORIDE CRYS ER 20 MEQ PO TBCR
40.0000 meq | EXTENDED_RELEASE_TABLET | Freq: Four times a day (QID) | ORAL | Status: DC
  Administered 2016-10-12: 40 meq via ORAL
  Filled 2016-10-12: qty 2

## 2016-10-12 MED ORDER — POTASSIUM CHLORIDE CRYS ER 20 MEQ PO TBCR: 20.0000 meq | EXTENDED_RELEASE_TABLET | Freq: Two times a day (BID) | ORAL | 0 refills | Status: DC

## 2016-10-12 MED ORDER — WARFARIN SODIUM 5 MG PO TABS: 5.0000 mg | ORAL_TABLET | Freq: Every day | ORAL | Status: DC

## 2016-10-12 MED ORDER — METOLAZONE 2.5 MG PO TABS
2.5000 mg | ORAL_TABLET | Freq: Once | ORAL | Status: AC
  Administered 2016-10-12: 2.5 mg via ORAL
  Filled 2016-10-12: qty 1

## 2016-10-12 MED ORDER — FUROSEMIDE 10 MG/ML IJ SOLN
60.0000 mg | Freq: Once | INTRAMUSCULAR | Status: AC
  Administered 2016-10-12: 60 mg via INTRAVENOUS
  Filled 2016-10-12: qty 6

## 2016-10-12 NOTE — Care Management Note (Addendum)
Case Management Note  Patient Details  Name: [name] MRN: [id number] Date of Birth: [birth year]  Subjective/Objective:     CM continuing to follow.                Action/Plan: 10/12/2016 Pt for d/c today , ambulance service requested.  Bambie RN with [place] [facility] notifed of d/c @ 1:20pm. Expected Discharge Date:  10/12/16               Expected Discharge Plan:  Home w Hospice Care  In-House Referral:     Discharge planning Services  CM Consult  Post Acute Care Choice:    Choice offered to:     DME Arranged:   Hospice DME Agency:   Hospice  HH Arranged:  [facility] aide and social worker [place] Agency:  [place]  Status of Service:  Completed, signed off  If discussed at [employer] of Stay Meetings, dates discussed:    Additional Comments:  Adron Bene, RN 10/12/2016, 1:14 PM

## 2016-10-12 NOTE — Progress Notes (Signed)
Notified MD of pt most recent B/P.

## 2016-10-12 NOTE — Discharge Instructions (Signed)
Follow with Primary MD Reginia Naas, MD in 7 days   Get CBC, CMP, INR checked  by Primary MD or SNF MD in 5-7 days ( we routinely change or add medications that can affect your baseline labs and fluid status, therefore we recommend that you get the mentioned basic workup next visit with your PCP, your PCP may decide not to get them or add new tests based on their clinical decision)  Activity: As tolerated with Full fall precautions use walker/cane & assistance as needed  Disposition Home    Diet:   DIET SOFT with feeding assistance and aspiration precautions, 1.5 L total fluid restriction in 24 hrs.  For Heart failure patients - Check your Weight same time everyday, if you gain over 2 pounds, or you develop in leg swelling, experience more shortness of breath or chest pain, call your Primary MD immediately. Follow Cardiac Low Salt Diet and 1.5 lit/day fluid restriction.  On your next visit with your primary care physician please Get Medicines reviewed and adjusted.  Please request your Prim.MD to go over all Hospital Tests and Procedure/Radiological results at the follow up, please get all Hospital records sent to your Prim MD by signing hospital release before you go home.  If you experience worsening of your admission symptoms, develop shortness of breath, life threatening emergency, suicidal or homicidal thoughts you must seek medical attention immediately by calling 911 or calling your MD immediately  if symptoms less severe.  You Must read complete instructions/literature along with all the possible adverse reactions/side effects for all the Medicines you take and that have been prescribed to you. Take any new Medicines after you have completely understood and accpet all the possible adverse reactions/side effects.

## 2016-10-12 NOTE — Progress Notes (Signed)
Patient discharged to home, IV was discontinued. AVS reviewed with wife via telephone and caregiver and patient in room. Prescriptions provided, transport called. Patient left floor via stretcher with no complaints

## 2016-10-12 NOTE — Progress Notes (Signed)
[place] for Warfarin  Indication: atrial fibrillation  Patient Measurements: Height: 5\' 10"  (177.8 cm) Weight: 116 lb 13.5 oz (53 kg) IBW/kg (Calculated) : 73  Vital Signs: Temp: 98.2 F (36.8 C) (05/01 0427) BP: 92/46 (05/01 0521) Pulse Rate: 76 (05/01 0427)  Labs:  Recent Labs  10/09/16 1736  10/09/16 2354 10/10/16 0155 10/10/16 1118 10/11/16 0213 10/12/16 0535  HGB 12.0*  --   --   --   --   --   --   HCT 34.9*  --   --   --   --   --   --   PLT 184  --   --   --   --   --   --   APTT  --   --  53*  --   --   --   --   LABPROT  --   < > 35.0*  --  29.2* 27.1* 26.8*  INR  --   < > 3.38  --  2.70 2.46 2.42  CREATININE 0.95  --   --  0.84  --  0.79 1.12  < > = values in this interval not displayed.  Estimated Creatinine Clearance: 32.2 mL/min (by C-G formula based on SCr of 1.12 mg/dL).   Assessment: 83 y/o male admitted with worsening SOB, leg swelling and orthopnea.  Warfarin for Afib, INR 2.42. Hgb and plts stable- no bleeding noted Warfarin PTA dosing: 5 mg daily   Goal of Therapy:  INR 2-3 Monitor platelets by anticoagulation protocol: Yes   Plan:  - Continue Warfarin 5 mg PO daily - Daily INR while here- recommend INR being checked on Thursday 5/3 - Plan to go home with hospice today  Saahas Hidrogo D. Lander Eslick, PharmD, BCPS Clinical Pharmacist Pager: [phone] 10/12/2016 8:57 AM

## 2016-10-12 NOTE — Discharge Summary (Signed)
[name] DOB: 02/11/1923 DOA: 10/09/2016  PCP: Reginia Naas, MD  Admit date: 10/09/2016  Discharge date: 10/12/2016  Admitted From: Home  Disposition:  Home Hospice   Recommendations for Outpatient Follow-up:   Follow up with PCP in 1-2 weeks  PCP Please obtain BMP/CBC, 2 view CXR in 1week,  (see Discharge instructions)   PCP Please follow up on the following pending results: Monitor INR and BMP   Home Health: Hospice   Equipment/Devices: None  Consultations: Pall Care Discharge Condition: Guarded CODE STATUS: DNR   Diet Recommendation: DIET SOFT with feeding assistance and aspiration precautions, 1.5 L fluid restriction in 24 hours   Chief Complaint  Patient presents with  . Shortness of Breath     Brief history of present illness from the day of admission and additional interim summary    [name] a 83 y/o male with PMH significant for chronic systolic HF (EF 65-03%, last echo on 2013), HTN, dementia, restless leg syndrome, chronic resp failure (on 3L [place]) and chronic pain; who presented to ED with worsening SOB and swelling, he was recently replaced on hospice by his cardiologist, he was seen first time by the hospice PA yesterday and she requested that patient be admitted to the hospital for CHF symptoms.  He is now full comfort care and being discharged with home hospice, case discussed with patient's primary cardiologist Dr. Daneen Schick today.                                                                  Hospital Course    1.Acute on chronic hypoxic respiratory failure due to acute on chronic systolic CHF EF 54-65%. Patient follows with Dr. Daneen Schick cardiologist, he was on home hospice and then was discharged from it a while ago, he was replaced on home  hospice last week.  Patient Has gradually improved and now off BiPAP to nasal cannula oxygen which she uses at home at 3 L, has been diuresed towards to 10 L with IV Lasix drip, Nitropaste and Zaroxolyn, he feels much better, was seen by palliative care and home hospice has been arranged for, goal of care is comfort, will be placed on oral diuretic at much higher than home dose, blood pressure too low for beta blocker, ACE/ARB, goal of care is full comfort will be followed with home hospice. For now his home dose Coumadin is continued. If he chooses not to pursue it it will be okay to stop. We'll defer distal PCP and home hospice team.   2. Pleural effusions bilateral right more than left. Due to #1 above. Treatment as above, significantly improved with diuresis which will be continued at home with oral Lasix, continue home oxygen which is a 3 L nasal cannula per minute. Continue  nebulizer treatments as needed.  3. Hyponatremia. Due to massive fluid overload, much improved after diuresis.   4. Generalized weakness, chronic pain, severe protein calorie malnutrition. Supportive care, continue protein supplementations and monitor.  5. Anxiety and depression. Continue Remeron and as needed Ativan.  6. Mild dementia. Continue Namenda and Aricept. At risk for delirium.  7. Hypothyroidism. Continue Synthroid. TSH stable.  8. BPH. On Flomax.  9. GERD. On PPI continue.  10. Hypokalemia. Replaced.   11. Chronic atrial fibrillation. [place] vasc 2 score of at least 3 - that pressure too low for beta blocker, on Coumadin which is being continued, now that goal is comfort care if PCP and home hospice team want Coumadin can be discontinued.    Discharge diagnosis     Principal Problem:   Acute on chronic respiratory failure (HCC) Active Problems:   Pleural effusion on right   Weakness generalized   Restless leg syndrome   Hypothyroidism   GERD (gastroesophageal reflux disease)    Hyponatremia   Severe protein-calorie malnutrition (HCC)   Depression with anxiety   Unspecified dementia without behavioral disturbance   Acute on chronic congestive heart failure [facility])    Discharge instructions    Discharge Instructions    Discharge instructions    Complete by:  As directed    Follow with Primary MD Reginia Naas, MD in 7 days   Get CBC, CMP, INR, by Primary MD or SNF MD in 5-7 days ( we routinely change or add medications that can affect your baseline labs and fluid status, therefore we recommend that you get the mentioned basic workup next visit with your PCP, your PCP may decide not to get them or add new tests based on their clinical decision)  Activity: As tolerated with Full fall precautions use walker/cane & assistance as needed  Disposition Home    Diet:   DIET SOFT with feeding assistance and aspiration precautions, 1.5 L total fluid restriction in 24 hrs.  For Heart failure patients - Check your Weight same time everyday, if you gain over 2 pounds, or you develop in leg swelling, experience more shortness of breath or chest pain, call your Primary MD immediately. Follow Cardiac Low Salt Diet and 1.5 lit/day fluid restriction.  On your next visit with your primary care physician please Get Medicines reviewed and adjusted.  Please request your Prim.MD to go over all Hospital Tests and Procedure/Radiological results at the follow up, please get all Hospital records sent to your Prim MD by signing hospital release before you go home.  If you experience worsening of your admission symptoms, develop shortness of breath, life threatening emergency, suicidal or homicidal thoughts you must seek medical attention immediately by calling 911 or calling your MD immediately  if symptoms less severe.  You Must read complete instructions/literature along with all the possible adverse reactions/side effects for all the Medicines you take and that have been prescribed  to you. Take any new Medicines after you have completely understood and accpet all the possible adverse reactions/side effects.   Increase activity slowly    Complete by:  As directed       Discharge Medications   Allergies as of 10/12/2016      Reactions   Contrast Media [iodinated Diagnostic Agents] Other (See Comments)   Unknown. Neither the patient or his wife are aware of any allergy to contrast dye.    Penicillins Hives   Has patient had a PCN reaction causing immediate rash, facial/tongue/throat swelling, SOB  or lightheadedness with hypotension: Yes Has patient had a PCN reaction causing severe rash involving mucus membranes or skin necrosis: No Has patient had a PCN reaction that required hospitalization No Has patient had a PCN reaction occurring within the last 10 years: No If all of the above answers are "NO", then may proceed with Cephalosporin use.   Uroxatral [alfuzosin Hydrochloride] Other (See Comments)   hypotension      Medication List    STOP taking these medications   lisinopril 2.5 MG tablet Commonly known as:  PRINIVIL,ZESTRIL     TAKE these medications   AZO-CRANBERRY PO Take 1,000 mg by mouth daily.   diclofenac sodium 1 % Gel Commonly known as:  VOLTAREN Apply 1 application topically daily as needed (pain).   donepezil 5 MG tablet Commonly known as:  ARICEPT Take 1 tablet (5 mg total) by mouth at bedtime.   ENSURE Take 237 mLs by mouth daily.   ferrous gluconate 324 MG tablet Commonly known as:  FERGON Take 324 mg by mouth daily.   furosemide 40 MG tablet Commonly known as:  LASIX Take 2 tablets (80 mg total) by mouth 2 (two) times daily. What changed:  how much to take  when to take this   guaiFENesin 600 MG 12 hr tablet Commonly known as:  MUCINEX Take 600 mg by mouth 2 (two) times daily.   LACTAID PO Take 1 tablet by mouth 2 (two) times daily as needed (when drinking milk).   levothyroxine 75 MCG tablet Commonly known as:   SYNTHROID, LEVOTHROID Take 75 mcg by mouth daily before breakfast.   liver oil-zinc oxide 40 % ointment Commonly known as:  DESITIN Apply 1 application topically 3 (three) times daily as needed for irritation.   LORazepam 0.5 MG tablet Commonly known as:  ATIVAN Take 0.5 mg by mouth at bedtime.   Melatonin 10 MG Tabs Take 20 mg by mouth at bedtime.   memantine 10 MG tablet Commonly known as:  NAMENDA Take 10 mg by mouth 2 (two) times daily.   mirtazapine 15 MG tablet Commonly known as:  REMERON Take 15 mg by mouth at bedtime.   morphine 15 MG 12 hr tablet Commonly known as:  MS CONTIN Take 15 mg by mouth every 12 (twelve) hours.   multivitamin with minerals Tabs tablet Take 1 tablet by mouth daily.   omeprazole 40 MG capsule Commonly known as:  PRILOSEC Take 40 mg by mouth daily.   potassium chloride SA 20 MEQ tablet Commonly known as:  K-DUR,KLOR-CON Take 1 tablet (20 mEq total) by mouth 2 (two) times daily.   tamsulosin 0.4 MG Caps capsule Commonly known as:  FLOMAX Take 0.4 mg by mouth at bedtime.   temazepam 7.5 MG capsule Commonly known as:  RESTORIL Take 7.5 mg by mouth at bedtime.   tiZANidine 2 MG tablet Commonly known as:  ZANAFLEX Take 2 mg by mouth at bedtime.   vitamin B-12 1000 MCG tablet Commonly known as:  CYANOCOBALAMIN Take 1,000 mcg by mouth daily.   warfarin 5 MG tablet Commonly known as:  COUMADIN Take 5 mg by mouth at bedtime.       Follow-up [facility] Follow up.   Why:  Home Hospice  Contact information: [place] [address] [phone]        Reginia Naas, MD. Schedule an appointment as soon as possible for a visit.   Specialty:  Family Medicine Why:  as desired Contact information: [place] [address] [phone]           Major procedures and Radiology Reports - PLEASE review detailed and final reports  thoroughly  -         Dg Chest Port 1 View  Result Date: 10/11/2016 CLINICAL DATA:  Shortness of breath. EXAM: PORTABLE CHEST 1 VIEW COMPARISON:  10/09/2016 .  03/18/2016.  11/13/2014 FINDINGS: Cardiac pacer with lead tips projected over the right ventricle. Cardiomegaly. Interim improvement of bilateral pulmonary infiltrates/edema. Persistent but improved bilateral pleural effusions. Findings suggest improving CHF. No pneumothorax. IMPRESSION: 1. Interim improvement of bilateral pulmonary infiltrates/edema. Persistent but improved bilateral pleural effusions, right side greater than left. Findings suggest improving CHF. 2. Persistent cardiomegaly. Cardiac pacer noted with lead tip in the right atrium . Electronically Signed   By: Marcello Moores  Register   On: 10/11/2016 07:25   Dg Chest Portable 1 View  Result Date: 10/09/2016 CLINICAL DATA:  Shortness of breath and weakness for several days EXAM: PORTABLE CHEST 1 VIEW COMPARISON:  03/18/2016 FINDINGS: Cardiac shadow is enlarged but stable. A pacing device is again seen. Increasing effusions are seen right greater than left. Underlying atelectatic changes are noted as well. No bony abnormality is seen. IMPRESSION: Significant increase in bilateral pleural effusions right greater than left. Bibasilar atelectatic changes are noted. Electronically Signed   By: Inez Catalina M.D.   On: 10/09/2016 17:32    Micro Results     Recent Results (from the past 240 hour(s))  MRSA PCR Screening     Status: None   Collection Time: 10/09/16 10:32 PM  Result Value Ref Range Status   MRSA by PCR NEGATIVE NEGATIVE Final    Comment:        The GeneXpert MRSA Assay (FDA approved for NASAL specimens only), is one component of a comprehensive MRSA colonization surveillance program. It is not intended to diagnose MRSA infection nor to guide or monitor treatment for MRSA infections.     Today   Subjective    [name] today has no headache,no chest  abdominal pain,no new weakness tingling or numbness, feels much better wants to go home today.     Objective   Blood pressure (!) 92/46, pulse 76, temperature 98.2 F (36.8 C), resp. rate 16, height 5\' 10"  (1.778 m), weight 53 kg (116 lb 13.5 oz), SpO2 96 %.   Intake/Output Summary (Last 24 hours) at 10/12/16 0947 Last data filed at 10/12/16 0930  Gross per 24 hour  Intake              360 ml  Output             2150 ml  Net            -1790 ml    Exam Awake Alert,   [place].AT,PERRAL Supple Neck,No JVD, No cervical lymphadenopathy appriciated.  Symmetrical Chest wall movement, Good air movement bilaterally, few rales RRR,No Gallops,Rubs or new Murmurs, No Parasternal Heave +ve B.Sounds, Abd Soft, Non tender, No organomegaly appriciated, No rebound -guarding or rigidity. No Cyanosis, Clubbing , 2+ leg edema   Data Review   CBC w Diff: Lab Results  Component Value Date   WBC 6.3 10/09/2016   HGB 12.0 (L) 10/09/2016   HGB 13.6 04/10/2008   HCT 34.9 (L) 10/09/2016   HCT 35.9 (L) 10/07/2016   HCT 39.0 04/10/2008   PLT 184 10/09/2016   PLT 192 10/07/2016   LYMPHOPCT  10 10/09/2016   LYMPHOPCT 24.3 04/10/2008   MONOPCT 11 10/09/2016   MONOPCT 14.5 (H) 04/10/2008   EOSPCT 1 10/09/2016   EOSPCT 1.8 04/10/2008   BASOPCT 0 10/09/2016   BASOPCT 0.5 04/10/2008    CMP: Lab Results  Component Value Date   NA 133 (L) 10/12/2016   NA 125 (L) 10/07/2016   K 3.2 (L) 10/12/2016   CL 86 (L) 10/12/2016   CO2 38 (H) 10/12/2016   BUN 36 (H) 10/12/2016   BUN 27 10/07/2016   CREATININE 1.12 10/12/2016   PROT 6.6 10/09/2016   ALBUMIN 3.5 10/09/2016   BILITOT 0.6 10/09/2016   ALKPHOS 59 10/09/2016   AST 26 10/09/2016   ALT 19 10/09/2016  .   Total Time in preparing paper work, data evaluation and todays exam - 61 minutes  Lala Lund M.D on 10/12/2016 at 9:47 AM  [facility]   Office  [phone]

## 2016-10-13 DIAGNOSIS — I5023 Acute on chronic systolic (congestive) heart failure: Secondary | ICD-10-CM | POA: Diagnosis not present

## 2016-10-13 DIAGNOSIS — D649 Anemia, unspecified: Secondary | ICD-10-CM | POA: Diagnosis not present

## 2016-10-13 DIAGNOSIS — F068 Other specified mental disorders due to known physiological condition: Secondary | ICD-10-CM | POA: Diagnosis not present

## 2016-10-13 DIAGNOSIS — I340 Nonrheumatic mitral (valve) insufficiency: Secondary | ICD-10-CM | POA: Diagnosis not present

## 2016-10-13 DIAGNOSIS — E039 Hypothyroidism, unspecified: Secondary | ICD-10-CM | POA: Diagnosis not present

## 2016-10-13 DIAGNOSIS — K5790 Diverticulosis of intestine, part unspecified, without perforation or abscess without bleeding: Secondary | ICD-10-CM | POA: Diagnosis not present

## 2016-10-13 DIAGNOSIS — Z7901 Long term (current) use of anticoagulants: Secondary | ICD-10-CM | POA: Diagnosis not present

## 2016-10-13 DIAGNOSIS — R0602 Shortness of breath: Secondary | ICD-10-CM | POA: Diagnosis not present

## 2016-10-13 DIAGNOSIS — G2581 Restless legs syndrome: Secondary | ICD-10-CM | POA: Diagnosis not present

## 2016-10-13 DIAGNOSIS — R7989 Other specified abnormal findings of blood chemistry: Secondary | ICD-10-CM | POA: Diagnosis not present

## 2016-10-13 DIAGNOSIS — G629 Polyneuropathy, unspecified: Secondary | ICD-10-CM | POA: Diagnosis not present

## 2016-10-13 DIAGNOSIS — K269 Duodenal ulcer, unspecified as acute or chronic, without hemorrhage or perforation: Secondary | ICD-10-CM | POA: Diagnosis not present

## 2016-10-13 DIAGNOSIS — E871 Hypo-osmolality and hyponatremia: Secondary | ICD-10-CM | POA: Diagnosis not present

## 2016-10-13 DIAGNOSIS — I442 Atrioventricular block, complete: Secondary | ICD-10-CM | POA: Diagnosis not present

## 2016-10-13 DIAGNOSIS — I071 Rheumatic tricuspid insufficiency: Secondary | ICD-10-CM | POA: Diagnosis not present

## 2016-10-13 DIAGNOSIS — K209 Esophagitis, unspecified: Secondary | ICD-10-CM | POA: Diagnosis not present

## 2016-10-13 DIAGNOSIS — I482 Chronic atrial fibrillation: Secondary | ICD-10-CM | POA: Diagnosis not present

## 2016-10-13 DIAGNOSIS — D509 Iron deficiency anemia, unspecified: Secondary | ICD-10-CM | POA: Diagnosis not present

## 2016-10-13 DIAGNOSIS — J9621 Acute and chronic respiratory failure with hypoxia: Secondary | ICD-10-CM | POA: Diagnosis not present

## 2016-10-13 DIAGNOSIS — Z950 Presence of cardiac pacemaker: Secondary | ICD-10-CM | POA: Diagnosis not present

## 2016-10-13 DIAGNOSIS — J811 Chronic pulmonary edema: Secondary | ICD-10-CM | POA: Diagnosis not present

## 2016-10-13 DIAGNOSIS — I951 Orthostatic hypotension: Secondary | ICD-10-CM | POA: Diagnosis not present

## 2016-10-13 DIAGNOSIS — I351 Nonrheumatic aortic (valve) insufficiency: Secondary | ICD-10-CM | POA: Diagnosis not present

## 2016-10-13 DIAGNOSIS — I4892 Unspecified atrial flutter: Secondary | ICD-10-CM | POA: Diagnosis not present

## 2016-10-13 DIAGNOSIS — H3530 Unspecified macular degeneration: Secondary | ICD-10-CM | POA: Diagnosis not present

## 2016-10-15 ENCOUNTER — Telehealth: Payer: Self-pay | Admitting: Interventional Cardiology

## 2016-10-15 DIAGNOSIS — F068 Other specified mental disorders due to known physiological condition: Secondary | ICD-10-CM | POA: Diagnosis not present

## 2016-10-15 DIAGNOSIS — R0602 Shortness of breath: Secondary | ICD-10-CM | POA: Diagnosis not present

## 2016-10-15 DIAGNOSIS — E871 Hypo-osmolality and hyponatremia: Secondary | ICD-10-CM | POA: Diagnosis not present

## 2016-10-15 DIAGNOSIS — I5023 Acute on chronic systolic (congestive) heart failure: Secondary | ICD-10-CM | POA: Diagnosis not present

## 2016-10-15 DIAGNOSIS — J9621 Acute and chronic respiratory failure with hypoxia: Secondary | ICD-10-CM | POA: Diagnosis not present

## 2016-10-15 DIAGNOSIS — R7989 Other specified abnormal findings of blood chemistry: Secondary | ICD-10-CM | POA: Diagnosis not present

## 2016-10-15 NOTE — Telephone Encounter (Signed)
Spoke with wife and she states that pt has had no energy and she feels that he may be getting dehydrated.  States when Hospice nurse or CNA was there pt only had 350cc of output in foley bag.  Pt has been drinking 45-50 oz of fluid daily.  Pt did not eat last night but wife was able to get him to drink a whole can of Ensure last night and this morning.  Wife did not have values but states BP and HR have been normal.  States pt is not having SOB of swelling currently.  Wife said biggest concern is pt's weakness and just wanted to know about decreasing diuretics.  Advised I would send message to Dr. Tamala Julian for review and advisement.

## 2016-10-15 NOTE — Telephone Encounter (Signed)
New message     Can they decrease the Lasik he is getting dehydrated

## 2016-10-19 DIAGNOSIS — J9621 Acute and chronic respiratory failure with hypoxia: Secondary | ICD-10-CM | POA: Diagnosis not present

## 2016-10-19 DIAGNOSIS — I5023 Acute on chronic systolic (congestive) heart failure: Secondary | ICD-10-CM | POA: Diagnosis not present

## 2016-10-19 DIAGNOSIS — R0602 Shortness of breath: Secondary | ICD-10-CM | POA: Diagnosis not present

## 2016-10-19 DIAGNOSIS — R7989 Other specified abnormal findings of blood chemistry: Secondary | ICD-10-CM | POA: Diagnosis not present

## 2016-10-19 DIAGNOSIS — E871 Hypo-osmolality and hyponatremia: Secondary | ICD-10-CM | POA: Diagnosis not present

## 2016-10-19 DIAGNOSIS — F068 Other specified mental disorders due to known physiological condition: Secondary | ICD-10-CM | POA: Diagnosis not present

## 2016-10-20 NOTE — Telephone Encounter (Signed)
Spoke with wife and reviewed recommendations per Dr. Tamala Julian.  Wife in agreement and appreciative for call.  Spoke with Langley Gauss at [facility] and advised her of order for BMET and provided fax number.  She states they will attempt to get this drawn but fear pt maybe too dehydrated to stick.  Asked that they call and let us know if unable to get.  Denise in agreement with plan.

## 2016-10-20 NOTE — Telephone Encounter (Signed)
I would not change his meds. He needs to come to the office for basic metabolic panel or have hospice do and see in results to my attention.

## 2016-10-21 DIAGNOSIS — F068 Other specified mental disorders due to known physiological condition: Secondary | ICD-10-CM | POA: Diagnosis not present

## 2016-10-21 DIAGNOSIS — R0602 Shortness of breath: Secondary | ICD-10-CM | POA: Diagnosis not present

## 2016-10-21 DIAGNOSIS — I5023 Acute on chronic systolic (congestive) heart failure: Secondary | ICD-10-CM | POA: Diagnosis not present

## 2016-10-21 DIAGNOSIS — R7989 Other specified abnormal findings of blood chemistry: Secondary | ICD-10-CM | POA: Diagnosis not present

## 2016-10-21 DIAGNOSIS — E871 Hypo-osmolality and hyponatremia: Secondary | ICD-10-CM | POA: Diagnosis not present

## 2016-10-21 DIAGNOSIS — J9621 Acute and chronic respiratory failure with hypoxia: Secondary | ICD-10-CM | POA: Diagnosis not present

## 2016-10-22 ENCOUNTER — Telehealth: Payer: Self-pay | Admitting: Interventional Cardiology

## 2016-10-25 ENCOUNTER — Telehealth: Payer: Self-pay | Admitting: *Deleted

## 2016-10-25 MED ORDER — POTASSIUM CHLORIDE CRYS ER 20 MEQ PO TBCR: 20.0000 meq | EXTENDED_RELEASE_TABLET | Freq: Every day | ORAL | 0 refills | Status: DC

## 2016-10-25 MED ORDER — FUROSEMIDE 40 MG PO TABS: 80.0000 mg | ORAL_TABLET | Freq: Every day | ORAL | 0 refills | Status: DC

## 2016-10-25 NOTE — Telephone Encounter (Signed)
Spoke with wife and made her aware of medication changes per Dr. Tamala Julian.  Wife appreciative for call.  Left message for Hospice nurse to call back to go over medication changes as well.

## 2016-10-25 NOTE — Telephone Encounter (Signed)
Spoke with Langley Gauss, Hospice nurse, and advised her of med changes per Dr. Tamala Julian.  She verbalized understanding.

## 2016-10-25 NOTE — Telephone Encounter (Signed)
-----   Message from Belva Crome, MD sent at 10/24/2016  8:34 AM EDT ----- Let the patient know he may be dehydrated. Decrease furosemide to 80 mg daily and potassium down to one tablet daily. Monitor for swelling. A copy will be sent to Carol Ada, MD

## 2016-10-26 DIAGNOSIS — J9621 Acute and chronic respiratory failure with hypoxia: Secondary | ICD-10-CM | POA: Diagnosis not present

## 2016-10-26 DIAGNOSIS — R7989 Other specified abnormal findings of blood chemistry: Secondary | ICD-10-CM | POA: Diagnosis not present

## 2016-10-26 DIAGNOSIS — E871 Hypo-osmolality and hyponatremia: Secondary | ICD-10-CM | POA: Diagnosis not present

## 2016-10-26 DIAGNOSIS — R0602 Shortness of breath: Secondary | ICD-10-CM | POA: Diagnosis not present

## 2016-10-26 DIAGNOSIS — I5023 Acute on chronic systolic (congestive) heart failure: Secondary | ICD-10-CM | POA: Diagnosis not present

## 2016-10-26 DIAGNOSIS — F068 Other specified mental disorders due to known physiological condition: Secondary | ICD-10-CM | POA: Diagnosis not present

## 2016-10-27 ENCOUNTER — Other Ambulatory Visit: Payer: Self-pay | Admitting: Interventional Cardiology

## 2016-10-27 DIAGNOSIS — F068 Other specified mental disorders due to known physiological condition: Secondary | ICD-10-CM | POA: Diagnosis not present

## 2016-10-27 DIAGNOSIS — R7989 Other specified abnormal findings of blood chemistry: Secondary | ICD-10-CM | POA: Diagnosis not present

## 2016-10-27 DIAGNOSIS — I5023 Acute on chronic systolic (congestive) heart failure: Secondary | ICD-10-CM | POA: Diagnosis not present

## 2016-10-27 DIAGNOSIS — J9621 Acute and chronic respiratory failure with hypoxia: Secondary | ICD-10-CM | POA: Diagnosis not present

## 2016-10-27 DIAGNOSIS — R0602 Shortness of breath: Secondary | ICD-10-CM | POA: Diagnosis not present

## 2016-10-27 DIAGNOSIS — E871 Hypo-osmolality and hyponatremia: Secondary | ICD-10-CM | POA: Diagnosis not present

## 2016-10-27 MED ORDER — POTASSIUM CHLORIDE CRYS ER 20 MEQ PO TBCR: 20.0000 meq | EXTENDED_RELEASE_TABLET | Freq: Every day | ORAL | 10 refills | Status: AC

## 2016-10-27 NOTE — Telephone Encounter (Signed)
Spoke with wife and she was just needing a prescription sent in.  Advised her prescription was just sent in today so they should be available for pick up later.  Wife appreciative for call.

## 2016-10-27 NOTE — Telephone Encounter (Signed)
New message   Pt wife is asking for a call back from RN about medication.   *STAT* If patient is at the pharmacy, call can be transferred to refill team.   1. Which medications need to be refilled? (please list name of each medication and dose if known) potassium  2. Which pharmacy/location (including street and city if local pharmacy) is medication to be sent to?[facility] [phone]  3. Do they need a 30 day or 90 day supply? 30 day

## 2016-10-27 NOTE — Telephone Encounter (Signed)
Pt's medication was sent to pt's pharmacy as requested. Confirmation received.  °

## 2016-10-30 DIAGNOSIS — F068 Other specified mental disorders due to known physiological condition: Secondary | ICD-10-CM | POA: Diagnosis not present

## 2016-10-30 DIAGNOSIS — E871 Hypo-osmolality and hyponatremia: Secondary | ICD-10-CM | POA: Diagnosis not present

## 2016-10-30 DIAGNOSIS — R0602 Shortness of breath: Secondary | ICD-10-CM | POA: Diagnosis not present

## 2016-10-30 DIAGNOSIS — R7989 Other specified abnormal findings of blood chemistry: Secondary | ICD-10-CM | POA: Diagnosis not present

## 2016-10-30 DIAGNOSIS — J9621 Acute and chronic respiratory failure with hypoxia: Secondary | ICD-10-CM | POA: Diagnosis not present

## 2016-10-30 DIAGNOSIS — I5023 Acute on chronic systolic (congestive) heart failure: Secondary | ICD-10-CM | POA: Diagnosis not present

## 2016-11-04 DIAGNOSIS — R0602 Shortness of breath: Secondary | ICD-10-CM | POA: Diagnosis not present

## 2016-11-04 DIAGNOSIS — J9621 Acute and chronic respiratory failure with hypoxia: Secondary | ICD-10-CM | POA: Diagnosis not present

## 2016-11-04 DIAGNOSIS — I5023 Acute on chronic systolic (congestive) heart failure: Secondary | ICD-10-CM | POA: Diagnosis not present

## 2016-11-04 DIAGNOSIS — R7989 Other specified abnormal findings of blood chemistry: Secondary | ICD-10-CM | POA: Diagnosis not present

## 2016-11-04 DIAGNOSIS — E871 Hypo-osmolality and hyponatremia: Secondary | ICD-10-CM | POA: Diagnosis not present

## 2016-11-04 DIAGNOSIS — F068 Other specified mental disorders due to known physiological condition: Secondary | ICD-10-CM | POA: Diagnosis not present

## 2016-11-09 DIAGNOSIS — R7989 Other specified abnormal findings of blood chemistry: Secondary | ICD-10-CM | POA: Diagnosis not present

## 2016-11-09 DIAGNOSIS — R0602 Shortness of breath: Secondary | ICD-10-CM | POA: Diagnosis not present

## 2016-11-09 DIAGNOSIS — F068 Other specified mental disorders due to known physiological condition: Secondary | ICD-10-CM | POA: Diagnosis not present

## 2016-11-09 DIAGNOSIS — E871 Hypo-osmolality and hyponatremia: Secondary | ICD-10-CM | POA: Diagnosis not present

## 2016-11-09 DIAGNOSIS — I5023 Acute on chronic systolic (congestive) heart failure: Secondary | ICD-10-CM | POA: Diagnosis not present

## 2016-11-09 DIAGNOSIS — J9621 Acute and chronic respiratory failure with hypoxia: Secondary | ICD-10-CM | POA: Diagnosis not present

## 2016-11-10 DIAGNOSIS — R0602 Shortness of breath: Secondary | ICD-10-CM | POA: Diagnosis not present

## 2016-11-10 DIAGNOSIS — E871 Hypo-osmolality and hyponatremia: Secondary | ICD-10-CM | POA: Diagnosis not present

## 2016-11-10 DIAGNOSIS — J9621 Acute and chronic respiratory failure with hypoxia: Secondary | ICD-10-CM | POA: Diagnosis not present

## 2016-11-10 DIAGNOSIS — R7989 Other specified abnormal findings of blood chemistry: Secondary | ICD-10-CM | POA: Diagnosis not present

## 2016-11-10 DIAGNOSIS — I5023 Acute on chronic systolic (congestive) heart failure: Secondary | ICD-10-CM | POA: Diagnosis not present

## 2016-11-10 DIAGNOSIS — F068 Other specified mental disorders due to known physiological condition: Secondary | ICD-10-CM | POA: Diagnosis not present

## 2016-11-11 DIAGNOSIS — E871 Hypo-osmolality and hyponatremia: Secondary | ICD-10-CM | POA: Diagnosis not present

## 2016-11-11 DIAGNOSIS — J9621 Acute and chronic respiratory failure with hypoxia: Secondary | ICD-10-CM | POA: Diagnosis not present

## 2016-11-11 DIAGNOSIS — F068 Other specified mental disorders due to known physiological condition: Secondary | ICD-10-CM | POA: Diagnosis not present

## 2016-11-11 DIAGNOSIS — R0602 Shortness of breath: Secondary | ICD-10-CM | POA: Diagnosis not present

## 2016-11-11 DIAGNOSIS — R7989 Other specified abnormal findings of blood chemistry: Secondary | ICD-10-CM | POA: Diagnosis not present

## 2016-11-11 DIAGNOSIS — I5023 Acute on chronic systolic (congestive) heart failure: Secondary | ICD-10-CM | POA: Diagnosis not present

## 2016-11-12 DIAGNOSIS — I071 Rheumatic tricuspid insufficiency: Secondary | ICD-10-CM | POA: Diagnosis not present

## 2016-11-12 DIAGNOSIS — I951 Orthostatic hypotension: Secondary | ICD-10-CM | POA: Diagnosis not present

## 2016-11-12 DIAGNOSIS — K269 Duodenal ulcer, unspecified as acute or chronic, without hemorrhage or perforation: Secondary | ICD-10-CM | POA: Diagnosis not present

## 2016-11-12 DIAGNOSIS — D509 Iron deficiency anemia, unspecified: Secondary | ICD-10-CM | POA: Diagnosis not present

## 2016-11-12 DIAGNOSIS — G629 Polyneuropathy, unspecified: Secondary | ICD-10-CM | POA: Diagnosis not present

## 2016-11-12 DIAGNOSIS — I351 Nonrheumatic aortic (valve) insufficiency: Secondary | ICD-10-CM | POA: Diagnosis not present

## 2016-11-12 DIAGNOSIS — E039 Hypothyroidism, unspecified: Secondary | ICD-10-CM | POA: Diagnosis not present

## 2016-11-12 DIAGNOSIS — G2581 Restless legs syndrome: Secondary | ICD-10-CM | POA: Diagnosis not present

## 2016-11-12 DIAGNOSIS — I5023 Acute on chronic systolic (congestive) heart failure: Secondary | ICD-10-CM | POA: Diagnosis not present

## 2016-11-12 DIAGNOSIS — I4892 Unspecified atrial flutter: Secondary | ICD-10-CM | POA: Diagnosis not present

## 2016-11-12 DIAGNOSIS — I482 Chronic atrial fibrillation: Secondary | ICD-10-CM | POA: Diagnosis not present

## 2016-11-12 DIAGNOSIS — D649 Anemia, unspecified: Secondary | ICD-10-CM | POA: Diagnosis not present

## 2016-11-12 DIAGNOSIS — I442 Atrioventricular block, complete: Secondary | ICD-10-CM | POA: Diagnosis not present

## 2016-11-12 DIAGNOSIS — I340 Nonrheumatic mitral (valve) insufficiency: Secondary | ICD-10-CM | POA: Diagnosis not present

## 2016-11-12 DIAGNOSIS — E871 Hypo-osmolality and hyponatremia: Secondary | ICD-10-CM | POA: Diagnosis not present

## 2016-11-12 DIAGNOSIS — F068 Other specified mental disorders due to known physiological condition: Secondary | ICD-10-CM | POA: Diagnosis not present

## 2016-11-12 DIAGNOSIS — J9621 Acute and chronic respiratory failure with hypoxia: Secondary | ICD-10-CM | POA: Diagnosis not present

## 2016-11-12 DIAGNOSIS — Z7901 Long term (current) use of anticoagulants: Secondary | ICD-10-CM | POA: Diagnosis not present

## 2016-11-12 DIAGNOSIS — R0602 Shortness of breath: Secondary | ICD-10-CM | POA: Diagnosis not present

## 2016-11-12 DIAGNOSIS — J811 Chronic pulmonary edema: Secondary | ICD-10-CM | POA: Diagnosis not present

## 2016-11-12 DIAGNOSIS — R7989 Other specified abnormal findings of blood chemistry: Secondary | ICD-10-CM | POA: Diagnosis not present

## 2016-11-12 DIAGNOSIS — H3530 Unspecified macular degeneration: Secondary | ICD-10-CM | POA: Diagnosis not present

## 2016-11-12 DIAGNOSIS — K5790 Diverticulosis of intestine, part unspecified, without perforation or abscess without bleeding: Secondary | ICD-10-CM | POA: Diagnosis not present

## 2016-11-12 DIAGNOSIS — K209 Esophagitis, unspecified: Secondary | ICD-10-CM | POA: Diagnosis not present

## 2016-11-12 DIAGNOSIS — Z950 Presence of cardiac pacemaker: Secondary | ICD-10-CM | POA: Diagnosis not present

## 2016-11-16 DIAGNOSIS — E871 Hypo-osmolality and hyponatremia: Secondary | ICD-10-CM | POA: Diagnosis not present

## 2016-11-16 DIAGNOSIS — R0602 Shortness of breath: Secondary | ICD-10-CM | POA: Diagnosis not present

## 2016-11-16 DIAGNOSIS — F068 Other specified mental disorders due to known physiological condition: Secondary | ICD-10-CM | POA: Diagnosis not present

## 2016-11-16 DIAGNOSIS — J9621 Acute and chronic respiratory failure with hypoxia: Secondary | ICD-10-CM | POA: Diagnosis not present

## 2016-11-16 DIAGNOSIS — I5023 Acute on chronic systolic (congestive) heart failure: Secondary | ICD-10-CM | POA: Diagnosis not present

## 2016-11-16 DIAGNOSIS — R7989 Other specified abnormal findings of blood chemistry: Secondary | ICD-10-CM | POA: Diagnosis not present

## 2016-11-18 DIAGNOSIS — R7989 Other specified abnormal findings of blood chemistry: Secondary | ICD-10-CM | POA: Diagnosis not present

## 2016-11-18 DIAGNOSIS — J9621 Acute and chronic respiratory failure with hypoxia: Secondary | ICD-10-CM | POA: Diagnosis not present

## 2016-11-18 DIAGNOSIS — I5023 Acute on chronic systolic (congestive) heart failure: Secondary | ICD-10-CM | POA: Diagnosis not present

## 2016-11-18 DIAGNOSIS — F068 Other specified mental disorders due to known physiological condition: Secondary | ICD-10-CM | POA: Diagnosis not present

## 2016-11-18 DIAGNOSIS — R0602 Shortness of breath: Secondary | ICD-10-CM | POA: Diagnosis not present

## 2016-11-18 DIAGNOSIS — E871 Hypo-osmolality and hyponatremia: Secondary | ICD-10-CM | POA: Diagnosis not present

## 2016-11-22 DIAGNOSIS — I5023 Acute on chronic systolic (congestive) heart failure: Secondary | ICD-10-CM | POA: Diagnosis not present

## 2016-11-22 DIAGNOSIS — R0602 Shortness of breath: Secondary | ICD-10-CM | POA: Diagnosis not present

## 2016-11-22 DIAGNOSIS — E871 Hypo-osmolality and hyponatremia: Secondary | ICD-10-CM | POA: Diagnosis not present

## 2016-11-22 DIAGNOSIS — J9621 Acute and chronic respiratory failure with hypoxia: Secondary | ICD-10-CM | POA: Diagnosis not present

## 2016-11-22 DIAGNOSIS — R7989 Other specified abnormal findings of blood chemistry: Secondary | ICD-10-CM | POA: Diagnosis not present

## 2016-11-22 DIAGNOSIS — F068 Other specified mental disorders due to known physiological condition: Secondary | ICD-10-CM | POA: Diagnosis not present

## 2016-11-23 ENCOUNTER — Telehealth: Payer: Self-pay | Admitting: Interventional Cardiology

## 2016-11-23 MED ORDER — FUROSEMIDE 40 MG PO TABS: 80.0000 mg | ORAL_TABLET | Freq: Every day | ORAL | 9 refills | Status: DC

## 2016-11-23 NOTE — Telephone Encounter (Signed)
Per Dr. Tamala Julian, ----- Message from Belva Crome, MD sent at 10/24/2016  8:34 AM EDT ----- Let the patient know he may be dehydrated. Decrease furosemide to 80 mg daily and potassium down to one tablet daily. Monitor for swelling. A copy will be sent to Carol Ada, MD resent Rx , it was not sent the first time. It was on print. Confirmation received. Called pt's wife to inform her that the pt's Rx was sent to the pharmacy. Wife verbalized understanding.

## 2016-11-23 NOTE — Telephone Encounter (Signed)
[name] is calling because [name] Medication ( Furosemide is supposed to be for 40mg  twice a day ) and the pharmacy filled it for 40mg  once a day . She is not sure if this was how it was sent in . Please Call .Marland Kitchen Thanks

## 2016-11-24 DIAGNOSIS — R7989 Other specified abnormal findings of blood chemistry: Secondary | ICD-10-CM | POA: Diagnosis not present

## 2016-11-24 DIAGNOSIS — I5023 Acute on chronic systolic (congestive) heart failure: Secondary | ICD-10-CM | POA: Diagnosis not present

## 2016-11-24 DIAGNOSIS — R0602 Shortness of breath: Secondary | ICD-10-CM | POA: Diagnosis not present

## 2016-11-24 DIAGNOSIS — E871 Hypo-osmolality and hyponatremia: Secondary | ICD-10-CM | POA: Diagnosis not present

## 2016-11-24 DIAGNOSIS — F068 Other specified mental disorders due to known physiological condition: Secondary | ICD-10-CM | POA: Diagnosis not present

## 2016-11-24 DIAGNOSIS — J9621 Acute and chronic respiratory failure with hypoxia: Secondary | ICD-10-CM | POA: Diagnosis not present

## 2016-11-25 DIAGNOSIS — R7989 Other specified abnormal findings of blood chemistry: Secondary | ICD-10-CM | POA: Diagnosis not present

## 2016-11-25 DIAGNOSIS — R0602 Shortness of breath: Secondary | ICD-10-CM | POA: Diagnosis not present

## 2016-11-25 DIAGNOSIS — E871 Hypo-osmolality and hyponatremia: Secondary | ICD-10-CM | POA: Diagnosis not present

## 2016-11-25 DIAGNOSIS — J9621 Acute and chronic respiratory failure with hypoxia: Secondary | ICD-10-CM | POA: Diagnosis not present

## 2016-11-25 DIAGNOSIS — F068 Other specified mental disorders due to known physiological condition: Secondary | ICD-10-CM | POA: Diagnosis not present

## 2016-11-25 DIAGNOSIS — I5023 Acute on chronic systolic (congestive) heart failure: Secondary | ICD-10-CM | POA: Diagnosis not present

## 2016-11-29 DIAGNOSIS — I5023 Acute on chronic systolic (congestive) heart failure: Secondary | ICD-10-CM | POA: Diagnosis not present

## 2016-11-29 DIAGNOSIS — J9621 Acute and chronic respiratory failure with hypoxia: Secondary | ICD-10-CM | POA: Diagnosis not present

## 2016-11-29 DIAGNOSIS — R7989 Other specified abnormal findings of blood chemistry: Secondary | ICD-10-CM | POA: Diagnosis not present

## 2016-11-29 DIAGNOSIS — F068 Other specified mental disorders due to known physiological condition: Secondary | ICD-10-CM | POA: Diagnosis not present

## 2016-11-29 DIAGNOSIS — E871 Hypo-osmolality and hyponatremia: Secondary | ICD-10-CM | POA: Diagnosis not present

## 2016-11-29 DIAGNOSIS — R0602 Shortness of breath: Secondary | ICD-10-CM | POA: Diagnosis not present

## 2016-12-07 DIAGNOSIS — R0602 Shortness of breath: Secondary | ICD-10-CM | POA: Diagnosis not present

## 2016-12-07 DIAGNOSIS — F068 Other specified mental disorders due to known physiological condition: Secondary | ICD-10-CM | POA: Diagnosis not present

## 2016-12-07 DIAGNOSIS — J9621 Acute and chronic respiratory failure with hypoxia: Secondary | ICD-10-CM | POA: Diagnosis not present

## 2016-12-07 DIAGNOSIS — E871 Hypo-osmolality and hyponatremia: Secondary | ICD-10-CM | POA: Diagnosis not present

## 2016-12-07 DIAGNOSIS — R7989 Other specified abnormal findings of blood chemistry: Secondary | ICD-10-CM | POA: Diagnosis not present

## 2016-12-07 DIAGNOSIS — I5023 Acute on chronic systolic (congestive) heart failure: Secondary | ICD-10-CM | POA: Diagnosis not present

## 2016-12-08 DIAGNOSIS — I5023 Acute on chronic systolic (congestive) heart failure: Secondary | ICD-10-CM | POA: Diagnosis not present

## 2016-12-08 DIAGNOSIS — F068 Other specified mental disorders due to known physiological condition: Secondary | ICD-10-CM | POA: Diagnosis not present

## 2016-12-08 DIAGNOSIS — E871 Hypo-osmolality and hyponatremia: Secondary | ICD-10-CM | POA: Diagnosis not present

## 2016-12-08 DIAGNOSIS — R7989 Other specified abnormal findings of blood chemistry: Secondary | ICD-10-CM | POA: Diagnosis not present

## 2016-12-08 DIAGNOSIS — R0602 Shortness of breath: Secondary | ICD-10-CM | POA: Diagnosis not present

## 2016-12-08 DIAGNOSIS — J9621 Acute and chronic respiratory failure with hypoxia: Secondary | ICD-10-CM | POA: Diagnosis not present

## 2016-12-09 ENCOUNTER — Ambulatory Visit (INDEPENDENT_AMBULATORY_CARE_PROVIDER_SITE_OTHER): Admitting: *Deleted

## 2016-12-09 ENCOUNTER — Telehealth: Payer: Self-pay | Admitting: Cardiology

## 2016-12-09 DIAGNOSIS — I442 Atrioventricular block, complete: Secondary | ICD-10-CM

## 2016-12-09 NOTE — Telephone Encounter (Signed)
Confirmed remote transmission w/ pt caregiver.   

## 2016-12-09 NOTE — Progress Notes (Signed)
Remote pacemaker transmission.   

## 2016-12-10 ENCOUNTER — Encounter: Payer: Self-pay | Admitting: Cardiology

## 2016-12-10 DIAGNOSIS — I5023 Acute on chronic systolic (congestive) heart failure: Secondary | ICD-10-CM | POA: Diagnosis not present

## 2016-12-10 DIAGNOSIS — R7989 Other specified abnormal findings of blood chemistry: Secondary | ICD-10-CM | POA: Diagnosis not present

## 2016-12-10 DIAGNOSIS — R0602 Shortness of breath: Secondary | ICD-10-CM | POA: Diagnosis not present

## 2016-12-10 DIAGNOSIS — E871 Hypo-osmolality and hyponatremia: Secondary | ICD-10-CM | POA: Diagnosis not present

## 2016-12-10 DIAGNOSIS — J9621 Acute and chronic respiratory failure with hypoxia: Secondary | ICD-10-CM | POA: Diagnosis not present

## 2016-12-10 DIAGNOSIS — F068 Other specified mental disorders due to known physiological condition: Secondary | ICD-10-CM | POA: Diagnosis not present

## 2016-12-12 DIAGNOSIS — I482 Chronic atrial fibrillation: Secondary | ICD-10-CM | POA: Diagnosis not present

## 2016-12-12 DIAGNOSIS — Z950 Presence of cardiac pacemaker: Secondary | ICD-10-CM | POA: Diagnosis not present

## 2016-12-12 DIAGNOSIS — G2581 Restless legs syndrome: Secondary | ICD-10-CM | POA: Diagnosis not present

## 2016-12-12 DIAGNOSIS — I071 Rheumatic tricuspid insufficiency: Secondary | ICD-10-CM | POA: Diagnosis not present

## 2016-12-12 DIAGNOSIS — K269 Duodenal ulcer, unspecified as acute or chronic, without hemorrhage or perforation: Secondary | ICD-10-CM | POA: Diagnosis not present

## 2016-12-12 DIAGNOSIS — R7989 Other specified abnormal findings of blood chemistry: Secondary | ICD-10-CM | POA: Diagnosis not present

## 2016-12-12 DIAGNOSIS — I351 Nonrheumatic aortic (valve) insufficiency: Secondary | ICD-10-CM | POA: Diagnosis not present

## 2016-12-12 DIAGNOSIS — D509 Iron deficiency anemia, unspecified: Secondary | ICD-10-CM | POA: Diagnosis not present

## 2016-12-12 DIAGNOSIS — I5023 Acute on chronic systolic (congestive) heart failure: Secondary | ICD-10-CM | POA: Diagnosis not present

## 2016-12-12 DIAGNOSIS — I4892 Unspecified atrial flutter: Secondary | ICD-10-CM | POA: Diagnosis not present

## 2016-12-12 DIAGNOSIS — F068 Other specified mental disorders due to known physiological condition: Secondary | ICD-10-CM | POA: Diagnosis not present

## 2016-12-12 DIAGNOSIS — K209 Esophagitis, unspecified: Secondary | ICD-10-CM | POA: Diagnosis not present

## 2016-12-12 DIAGNOSIS — J811 Chronic pulmonary edema: Secondary | ICD-10-CM | POA: Diagnosis not present

## 2016-12-12 DIAGNOSIS — I340 Nonrheumatic mitral (valve) insufficiency: Secondary | ICD-10-CM | POA: Diagnosis not present

## 2016-12-12 DIAGNOSIS — D649 Anemia, unspecified: Secondary | ICD-10-CM | POA: Diagnosis not present

## 2016-12-12 DIAGNOSIS — Z7901 Long term (current) use of anticoagulants: Secondary | ICD-10-CM | POA: Diagnosis not present

## 2016-12-12 DIAGNOSIS — I951 Orthostatic hypotension: Secondary | ICD-10-CM | POA: Diagnosis not present

## 2016-12-12 DIAGNOSIS — H3530 Unspecified macular degeneration: Secondary | ICD-10-CM | POA: Diagnosis not present

## 2016-12-12 DIAGNOSIS — E039 Hypothyroidism, unspecified: Secondary | ICD-10-CM | POA: Diagnosis not present

## 2016-12-12 DIAGNOSIS — J9621 Acute and chronic respiratory failure with hypoxia: Secondary | ICD-10-CM | POA: Diagnosis not present

## 2016-12-12 DIAGNOSIS — I442 Atrioventricular block, complete: Secondary | ICD-10-CM | POA: Diagnosis not present

## 2016-12-12 DIAGNOSIS — K5790 Diverticulosis of intestine, part unspecified, without perforation or abscess without bleeding: Secondary | ICD-10-CM | POA: Diagnosis not present

## 2016-12-12 DIAGNOSIS — E871 Hypo-osmolality and hyponatremia: Secondary | ICD-10-CM | POA: Diagnosis not present

## 2016-12-12 DIAGNOSIS — R0602 Shortness of breath: Secondary | ICD-10-CM | POA: Diagnosis not present

## 2016-12-12 DIAGNOSIS — G629 Polyneuropathy, unspecified: Secondary | ICD-10-CM | POA: Diagnosis not present

## 2016-12-16 DIAGNOSIS — R0602 Shortness of breath: Secondary | ICD-10-CM | POA: Diagnosis not present

## 2016-12-16 DIAGNOSIS — R7989 Other specified abnormal findings of blood chemistry: Secondary | ICD-10-CM | POA: Diagnosis not present

## 2016-12-16 DIAGNOSIS — I5023 Acute on chronic systolic (congestive) heart failure: Secondary | ICD-10-CM | POA: Diagnosis not present

## 2016-12-16 DIAGNOSIS — E871 Hypo-osmolality and hyponatremia: Secondary | ICD-10-CM | POA: Diagnosis not present

## 2016-12-16 DIAGNOSIS — J9621 Acute and chronic respiratory failure with hypoxia: Secondary | ICD-10-CM | POA: Diagnosis not present

## 2016-12-16 DIAGNOSIS — F068 Other specified mental disorders due to known physiological condition: Secondary | ICD-10-CM | POA: Diagnosis not present

## 2016-12-20 DIAGNOSIS — J9621 Acute and chronic respiratory failure with hypoxia: Secondary | ICD-10-CM | POA: Diagnosis not present

## 2016-12-20 DIAGNOSIS — E871 Hypo-osmolality and hyponatremia: Secondary | ICD-10-CM | POA: Diagnosis not present

## 2016-12-20 DIAGNOSIS — R0602 Shortness of breath: Secondary | ICD-10-CM | POA: Diagnosis not present

## 2016-12-20 DIAGNOSIS — I5023 Acute on chronic systolic (congestive) heart failure: Secondary | ICD-10-CM | POA: Diagnosis not present

## 2016-12-20 DIAGNOSIS — F068 Other specified mental disorders due to known physiological condition: Secondary | ICD-10-CM | POA: Diagnosis not present

## 2016-12-20 DIAGNOSIS — R7989 Other specified abnormal findings of blood chemistry: Secondary | ICD-10-CM | POA: Diagnosis not present

## 2016-12-21 ENCOUNTER — Other Ambulatory Visit: Payer: Self-pay | Admitting: Interventional Cardiology

## 2016-12-21 DIAGNOSIS — R0602 Shortness of breath: Secondary | ICD-10-CM | POA: Diagnosis not present

## 2016-12-21 DIAGNOSIS — R7989 Other specified abnormal findings of blood chemistry: Secondary | ICD-10-CM | POA: Diagnosis not present

## 2016-12-21 DIAGNOSIS — J9621 Acute and chronic respiratory failure with hypoxia: Secondary | ICD-10-CM | POA: Diagnosis not present

## 2016-12-21 DIAGNOSIS — F068 Other specified mental disorders due to known physiological condition: Secondary | ICD-10-CM | POA: Diagnosis not present

## 2016-12-21 DIAGNOSIS — E871 Hypo-osmolality and hyponatremia: Secondary | ICD-10-CM | POA: Diagnosis not present

## 2016-12-21 DIAGNOSIS — I5023 Acute on chronic systolic (congestive) heart failure: Secondary | ICD-10-CM | POA: Diagnosis not present

## 2016-12-22 DIAGNOSIS — E871 Hypo-osmolality and hyponatremia: Secondary | ICD-10-CM | POA: Diagnosis not present

## 2016-12-22 DIAGNOSIS — I5023 Acute on chronic systolic (congestive) heart failure: Secondary | ICD-10-CM | POA: Diagnosis not present

## 2016-12-22 DIAGNOSIS — R0602 Shortness of breath: Secondary | ICD-10-CM | POA: Diagnosis not present

## 2016-12-22 DIAGNOSIS — F068 Other specified mental disorders due to known physiological condition: Secondary | ICD-10-CM | POA: Diagnosis not present

## 2016-12-22 DIAGNOSIS — R7989 Other specified abnormal findings of blood chemistry: Secondary | ICD-10-CM | POA: Diagnosis not present

## 2016-12-22 DIAGNOSIS — J9621 Acute and chronic respiratory failure with hypoxia: Secondary | ICD-10-CM | POA: Diagnosis not present

## 2016-12-27 ENCOUNTER — Telehealth: Payer: Self-pay | Admitting: Internal Medicine

## 2016-12-27 DIAGNOSIS — J9621 Acute and chronic respiratory failure with hypoxia: Secondary | ICD-10-CM | POA: Diagnosis not present

## 2016-12-27 DIAGNOSIS — F068 Other specified mental disorders due to known physiological condition: Secondary | ICD-10-CM | POA: Diagnosis not present

## 2016-12-27 DIAGNOSIS — R7989 Other specified abnormal findings of blood chemistry: Secondary | ICD-10-CM | POA: Diagnosis not present

## 2016-12-27 DIAGNOSIS — E871 Hypo-osmolality and hyponatremia: Secondary | ICD-10-CM | POA: Diagnosis not present

## 2016-12-27 DIAGNOSIS — R0602 Shortness of breath: Secondary | ICD-10-CM | POA: Diagnosis not present

## 2016-12-27 DIAGNOSIS — I5023 Acute on chronic systolic (congestive) heart failure: Secondary | ICD-10-CM | POA: Diagnosis not present

## 2016-12-27 NOTE — Telephone Encounter (Signed)
New message    Pt wife is calling asking for a call back about pt. She said it's about scheduling his appt for a pacer check. She does not want to schedule because he is under hospice care and the only way to get him here would be by ambulance.

## 2016-12-27 NOTE — Telephone Encounter (Signed)
Patient is followed remotely (next remote due 03/10/17).  Will defer to MD regarding f/u recommendations.   Routed to Dr. Rayann Heman for review.

## 2016-12-29 DIAGNOSIS — E871 Hypo-osmolality and hyponatremia: Secondary | ICD-10-CM | POA: Diagnosis not present

## 2016-12-29 DIAGNOSIS — R0602 Shortness of breath: Secondary | ICD-10-CM | POA: Diagnosis not present

## 2016-12-29 DIAGNOSIS — J9621 Acute and chronic respiratory failure with hypoxia: Secondary | ICD-10-CM | POA: Diagnosis not present

## 2016-12-29 DIAGNOSIS — R7989 Other specified abnormal findings of blood chemistry: Secondary | ICD-10-CM | POA: Diagnosis not present

## 2016-12-29 DIAGNOSIS — I5023 Acute on chronic systolic (congestive) heart failure: Secondary | ICD-10-CM | POA: Diagnosis not present

## 2016-12-29 DIAGNOSIS — F068 Other specified mental disorders due to known physiological condition: Secondary | ICD-10-CM | POA: Diagnosis not present

## 2017-01-04 DIAGNOSIS — E871 Hypo-osmolality and hyponatremia: Secondary | ICD-10-CM | POA: Diagnosis not present

## 2017-01-04 DIAGNOSIS — I5023 Acute on chronic systolic (congestive) heart failure: Secondary | ICD-10-CM | POA: Diagnosis not present

## 2017-01-04 DIAGNOSIS — R7989 Other specified abnormal findings of blood chemistry: Secondary | ICD-10-CM | POA: Diagnosis not present

## 2017-01-04 DIAGNOSIS — J9621 Acute and chronic respiratory failure with hypoxia: Secondary | ICD-10-CM | POA: Diagnosis not present

## 2017-01-04 DIAGNOSIS — R0602 Shortness of breath: Secondary | ICD-10-CM | POA: Diagnosis not present

## 2017-01-04 DIAGNOSIS — F068 Other specified mental disorders due to known physiological condition: Secondary | ICD-10-CM | POA: Diagnosis not present

## 2017-01-06 DIAGNOSIS — F068 Other specified mental disorders due to known physiological condition: Secondary | ICD-10-CM | POA: Diagnosis not present

## 2017-01-06 DIAGNOSIS — J9621 Acute and chronic respiratory failure with hypoxia: Secondary | ICD-10-CM | POA: Diagnosis not present

## 2017-01-06 DIAGNOSIS — R7989 Other specified abnormal findings of blood chemistry: Secondary | ICD-10-CM | POA: Diagnosis not present

## 2017-01-06 DIAGNOSIS — I5023 Acute on chronic systolic (congestive) heart failure: Secondary | ICD-10-CM | POA: Diagnosis not present

## 2017-01-06 DIAGNOSIS — E871 Hypo-osmolality and hyponatremia: Secondary | ICD-10-CM | POA: Diagnosis not present

## 2017-01-06 DIAGNOSIS — R0602 Shortness of breath: Secondary | ICD-10-CM | POA: Diagnosis not present

## 2017-01-07 LAB — CUP PACEART REMOTE DEVICE CHECK
Battery Remaining Longevity: 82 mo
Battery Remaining Percentage: 65 %
Battery Voltage: 2.9 V
Brady Statistic RV Percent Paced: 99 %
Lead Channel Impedance Value: 400 Ohm
Lead Channel Pacing Threshold Amplitude: 1.5 V
Lead Channel Setting Pacing Amplitude: 1.75 V
Lead Channel Setting Pacing Pulse Width: 0.5 ms
Lead Channel Setting Sensing Sensitivity: 2.5 mV
MDC IDC LEAD IMPLANT DT: 20101220
MDC IDC LEAD LOCATION: 753860
MDC IDC MSMT LEADCHNL RV PACING THRESHOLD PULSEWIDTH: 0.5 ms
MDC IDC MSMT LEADCHNL RV SENSING INTR AMPL: 4.6 mV
MDC IDC PG IMPLANT DT: 20101220
MDC IDC PG SERIAL: 2303264
MDC IDC SESS DTM: 20180628171559
Pulse Gen Model: 1110

## 2017-01-07 NOTE — Telephone Encounter (Signed)
Review of last remote reveals 6 years of estimated battery longevity and normal lead/ device function.  Given his advanced age, hospice, and travel difficulty,  I think that it would be very reasonable to follow him remotely going forward. I am happy to help he and his family any way that I can.

## 2017-01-07 NOTE — Telephone Encounter (Signed)
Advised patient's wife of Dr. Jackalyn Lombard recommendations.  She is very appreciative and is aware that patient's next remote transmission will be due on 03/10/17.  Patient's wife denies any additional questions or concerns at this time.

## 2017-01-12 DIAGNOSIS — D649 Anemia, unspecified: Secondary | ICD-10-CM | POA: Diagnosis not present

## 2017-01-12 DIAGNOSIS — J811 Chronic pulmonary edema: Secondary | ICD-10-CM | POA: Diagnosis not present

## 2017-01-12 DIAGNOSIS — R7989 Other specified abnormal findings of blood chemistry: Secondary | ICD-10-CM | POA: Diagnosis not present

## 2017-01-12 DIAGNOSIS — H3530 Unspecified macular degeneration: Secondary | ICD-10-CM | POA: Diagnosis not present

## 2017-01-12 DIAGNOSIS — F068 Other specified mental disorders due to known physiological condition: Secondary | ICD-10-CM | POA: Diagnosis not present

## 2017-01-12 DIAGNOSIS — D509 Iron deficiency anemia, unspecified: Secondary | ICD-10-CM | POA: Diagnosis not present

## 2017-01-12 DIAGNOSIS — I351 Nonrheumatic aortic (valve) insufficiency: Secondary | ICD-10-CM | POA: Diagnosis not present

## 2017-01-12 DIAGNOSIS — I071 Rheumatic tricuspid insufficiency: Secondary | ICD-10-CM | POA: Diagnosis not present

## 2017-01-12 DIAGNOSIS — K5790 Diverticulosis of intestine, part unspecified, without perforation or abscess without bleeding: Secondary | ICD-10-CM | POA: Diagnosis not present

## 2017-01-12 DIAGNOSIS — I482 Chronic atrial fibrillation: Secondary | ICD-10-CM | POA: Diagnosis not present

## 2017-01-12 DIAGNOSIS — I951 Orthostatic hypotension: Secondary | ICD-10-CM | POA: Diagnosis not present

## 2017-01-12 DIAGNOSIS — I442 Atrioventricular block, complete: Secondary | ICD-10-CM | POA: Diagnosis not present

## 2017-01-12 DIAGNOSIS — I340 Nonrheumatic mitral (valve) insufficiency: Secondary | ICD-10-CM | POA: Diagnosis not present

## 2017-01-12 DIAGNOSIS — I4892 Unspecified atrial flutter: Secondary | ICD-10-CM | POA: Diagnosis not present

## 2017-01-12 DIAGNOSIS — K209 Esophagitis, unspecified: Secondary | ICD-10-CM | POA: Diagnosis not present

## 2017-01-12 DIAGNOSIS — K269 Duodenal ulcer, unspecified as acute or chronic, without hemorrhage or perforation: Secondary | ICD-10-CM | POA: Diagnosis not present

## 2017-01-12 DIAGNOSIS — R0602 Shortness of breath: Secondary | ICD-10-CM | POA: Diagnosis not present

## 2017-01-12 DIAGNOSIS — E039 Hypothyroidism, unspecified: Secondary | ICD-10-CM | POA: Diagnosis not present

## 2017-01-12 DIAGNOSIS — E871 Hypo-osmolality and hyponatremia: Secondary | ICD-10-CM | POA: Diagnosis not present

## 2017-01-12 DIAGNOSIS — G2581 Restless legs syndrome: Secondary | ICD-10-CM | POA: Diagnosis not present

## 2017-01-12 DIAGNOSIS — G629 Polyneuropathy, unspecified: Secondary | ICD-10-CM | POA: Diagnosis not present

## 2017-01-12 DIAGNOSIS — I5023 Acute on chronic systolic (congestive) heart failure: Secondary | ICD-10-CM | POA: Diagnosis not present

## 2017-01-12 DIAGNOSIS — Z950 Presence of cardiac pacemaker: Secondary | ICD-10-CM | POA: Diagnosis not present

## 2017-01-12 DIAGNOSIS — Z7901 Long term (current) use of anticoagulants: Secondary | ICD-10-CM | POA: Diagnosis not present

## 2017-01-12 DIAGNOSIS — J9621 Acute and chronic respiratory failure with hypoxia: Secondary | ICD-10-CM | POA: Diagnosis not present

## 2017-01-14 DIAGNOSIS — R7989 Other specified abnormal findings of blood chemistry: Secondary | ICD-10-CM | POA: Diagnosis not present

## 2017-01-14 DIAGNOSIS — R0602 Shortness of breath: Secondary | ICD-10-CM | POA: Diagnosis not present

## 2017-01-14 DIAGNOSIS — F068 Other specified mental disorders due to known physiological condition: Secondary | ICD-10-CM | POA: Diagnosis not present

## 2017-01-14 DIAGNOSIS — I5023 Acute on chronic systolic (congestive) heart failure: Secondary | ICD-10-CM | POA: Diagnosis not present

## 2017-01-14 DIAGNOSIS — E871 Hypo-osmolality and hyponatremia: Secondary | ICD-10-CM | POA: Diagnosis not present

## 2017-01-14 DIAGNOSIS — J9621 Acute and chronic respiratory failure with hypoxia: Secondary | ICD-10-CM | POA: Diagnosis not present

## 2017-01-15 ENCOUNTER — Other Ambulatory Visit: Payer: Self-pay | Admitting: Interventional Cardiology

## 2017-01-18 DIAGNOSIS — E871 Hypo-osmolality and hyponatremia: Secondary | ICD-10-CM | POA: Diagnosis not present

## 2017-01-18 DIAGNOSIS — J9621 Acute and chronic respiratory failure with hypoxia: Secondary | ICD-10-CM | POA: Diagnosis not present

## 2017-01-18 DIAGNOSIS — R0602 Shortness of breath: Secondary | ICD-10-CM | POA: Diagnosis not present

## 2017-01-18 DIAGNOSIS — R7989 Other specified abnormal findings of blood chemistry: Secondary | ICD-10-CM | POA: Diagnosis not present

## 2017-01-18 DIAGNOSIS — I5023 Acute on chronic systolic (congestive) heart failure: Secondary | ICD-10-CM | POA: Diagnosis not present

## 2017-01-18 DIAGNOSIS — F068 Other specified mental disorders due to known physiological condition: Secondary | ICD-10-CM | POA: Diagnosis not present

## 2017-01-19 DIAGNOSIS — E871 Hypo-osmolality and hyponatremia: Secondary | ICD-10-CM | POA: Diagnosis not present

## 2017-01-19 DIAGNOSIS — J9621 Acute and chronic respiratory failure with hypoxia: Secondary | ICD-10-CM | POA: Diagnosis not present

## 2017-01-19 DIAGNOSIS — I5023 Acute on chronic systolic (congestive) heart failure: Secondary | ICD-10-CM | POA: Diagnosis not present

## 2017-01-19 DIAGNOSIS — R7989 Other specified abnormal findings of blood chemistry: Secondary | ICD-10-CM | POA: Diagnosis not present

## 2017-01-19 DIAGNOSIS — R0602 Shortness of breath: Secondary | ICD-10-CM | POA: Diagnosis not present

## 2017-01-19 DIAGNOSIS — F068 Other specified mental disorders due to known physiological condition: Secondary | ICD-10-CM | POA: Diagnosis not present

## 2017-01-20 DIAGNOSIS — R0602 Shortness of breath: Secondary | ICD-10-CM | POA: Diagnosis not present

## 2017-01-20 DIAGNOSIS — F068 Other specified mental disorders due to known physiological condition: Secondary | ICD-10-CM | POA: Diagnosis not present

## 2017-01-20 DIAGNOSIS — I5023 Acute on chronic systolic (congestive) heart failure: Secondary | ICD-10-CM | POA: Diagnosis not present

## 2017-01-20 DIAGNOSIS — E871 Hypo-osmolality and hyponatremia: Secondary | ICD-10-CM | POA: Diagnosis not present

## 2017-01-20 DIAGNOSIS — J9621 Acute and chronic respiratory failure with hypoxia: Secondary | ICD-10-CM | POA: Diagnosis not present

## 2017-01-20 DIAGNOSIS — R7989 Other specified abnormal findings of blood chemistry: Secondary | ICD-10-CM | POA: Diagnosis not present

## 2017-01-21 DIAGNOSIS — F068 Other specified mental disorders due to known physiological condition: Secondary | ICD-10-CM | POA: Diagnosis not present

## 2017-01-21 DIAGNOSIS — J9621 Acute and chronic respiratory failure with hypoxia: Secondary | ICD-10-CM | POA: Diagnosis not present

## 2017-01-21 DIAGNOSIS — R7989 Other specified abnormal findings of blood chemistry: Secondary | ICD-10-CM | POA: Diagnosis not present

## 2017-01-21 DIAGNOSIS — E871 Hypo-osmolality and hyponatremia: Secondary | ICD-10-CM | POA: Diagnosis not present

## 2017-01-21 DIAGNOSIS — R0602 Shortness of breath: Secondary | ICD-10-CM | POA: Diagnosis not present

## 2017-01-21 DIAGNOSIS — I5023 Acute on chronic systolic (congestive) heart failure: Secondary | ICD-10-CM | POA: Diagnosis not present

## 2017-01-24 DIAGNOSIS — J9621 Acute and chronic respiratory failure with hypoxia: Secondary | ICD-10-CM | POA: Diagnosis not present

## 2017-01-24 DIAGNOSIS — I5023 Acute on chronic systolic (congestive) heart failure: Secondary | ICD-10-CM | POA: Diagnosis not present

## 2017-01-24 DIAGNOSIS — R7989 Other specified abnormal findings of blood chemistry: Secondary | ICD-10-CM | POA: Diagnosis not present

## 2017-01-24 DIAGNOSIS — F068 Other specified mental disorders due to known physiological condition: Secondary | ICD-10-CM | POA: Diagnosis not present

## 2017-01-24 DIAGNOSIS — E871 Hypo-osmolality and hyponatremia: Secondary | ICD-10-CM | POA: Diagnosis not present

## 2017-01-24 DIAGNOSIS — R0602 Shortness of breath: Secondary | ICD-10-CM | POA: Diagnosis not present

## 2017-01-25 DIAGNOSIS — R0602 Shortness of breath: Secondary | ICD-10-CM | POA: Diagnosis not present

## 2017-01-25 DIAGNOSIS — R7989 Other specified abnormal findings of blood chemistry: Secondary | ICD-10-CM | POA: Diagnosis not present

## 2017-01-25 DIAGNOSIS — E871 Hypo-osmolality and hyponatremia: Secondary | ICD-10-CM | POA: Diagnosis not present

## 2017-01-25 DIAGNOSIS — I5023 Acute on chronic systolic (congestive) heart failure: Secondary | ICD-10-CM | POA: Diagnosis not present

## 2017-01-25 DIAGNOSIS — F068 Other specified mental disorders due to known physiological condition: Secondary | ICD-10-CM | POA: Diagnosis not present

## 2017-01-25 DIAGNOSIS — J9621 Acute and chronic respiratory failure with hypoxia: Secondary | ICD-10-CM | POA: Diagnosis not present

## 2017-01-26 DIAGNOSIS — R0602 Shortness of breath: Secondary | ICD-10-CM | POA: Diagnosis not present

## 2017-01-26 DIAGNOSIS — R7989 Other specified abnormal findings of blood chemistry: Secondary | ICD-10-CM | POA: Diagnosis not present

## 2017-01-26 DIAGNOSIS — F068 Other specified mental disorders due to known physiological condition: Secondary | ICD-10-CM | POA: Diagnosis not present

## 2017-01-26 DIAGNOSIS — E871 Hypo-osmolality and hyponatremia: Secondary | ICD-10-CM | POA: Diagnosis not present

## 2017-01-26 DIAGNOSIS — J9621 Acute and chronic respiratory failure with hypoxia: Secondary | ICD-10-CM | POA: Diagnosis not present

## 2017-01-26 DIAGNOSIS — I5023 Acute on chronic systolic (congestive) heart failure: Secondary | ICD-10-CM | POA: Diagnosis not present

## 2017-01-27 DIAGNOSIS — J9621 Acute and chronic respiratory failure with hypoxia: Secondary | ICD-10-CM | POA: Diagnosis not present

## 2017-01-27 DIAGNOSIS — E871 Hypo-osmolality and hyponatremia: Secondary | ICD-10-CM | POA: Diagnosis not present

## 2017-01-27 DIAGNOSIS — I5023 Acute on chronic systolic (congestive) heart failure: Secondary | ICD-10-CM | POA: Diagnosis not present

## 2017-01-27 DIAGNOSIS — F068 Other specified mental disorders due to known physiological condition: Secondary | ICD-10-CM | POA: Diagnosis not present

## 2017-01-27 DIAGNOSIS — R7989 Other specified abnormal findings of blood chemistry: Secondary | ICD-10-CM | POA: Diagnosis not present

## 2017-01-27 DIAGNOSIS — R0602 Shortness of breath: Secondary | ICD-10-CM | POA: Diagnosis not present

## 2017-02-01 DIAGNOSIS — E871 Hypo-osmolality and hyponatremia: Secondary | ICD-10-CM | POA: Diagnosis not present

## 2017-02-01 DIAGNOSIS — R7989 Other specified abnormal findings of blood chemistry: Secondary | ICD-10-CM | POA: Diagnosis not present

## 2017-02-01 DIAGNOSIS — J9621 Acute and chronic respiratory failure with hypoxia: Secondary | ICD-10-CM | POA: Diagnosis not present

## 2017-02-01 DIAGNOSIS — R0602 Shortness of breath: Secondary | ICD-10-CM | POA: Diagnosis not present

## 2017-02-01 DIAGNOSIS — I5023 Acute on chronic systolic (congestive) heart failure: Secondary | ICD-10-CM | POA: Diagnosis not present

## 2017-02-01 DIAGNOSIS — F068 Other specified mental disorders due to known physiological condition: Secondary | ICD-10-CM | POA: Diagnosis not present

## 2017-02-03 DIAGNOSIS — J9621 Acute and chronic respiratory failure with hypoxia: Secondary | ICD-10-CM | POA: Diagnosis not present

## 2017-02-03 DIAGNOSIS — R0602 Shortness of breath: Secondary | ICD-10-CM | POA: Diagnosis not present

## 2017-02-03 DIAGNOSIS — E871 Hypo-osmolality and hyponatremia: Secondary | ICD-10-CM | POA: Diagnosis not present

## 2017-02-03 DIAGNOSIS — F068 Other specified mental disorders due to known physiological condition: Secondary | ICD-10-CM | POA: Diagnosis not present

## 2017-02-03 DIAGNOSIS — R7989 Other specified abnormal findings of blood chemistry: Secondary | ICD-10-CM | POA: Diagnosis not present

## 2017-02-03 DIAGNOSIS — I5023 Acute on chronic systolic (congestive) heart failure: Secondary | ICD-10-CM | POA: Diagnosis not present

## 2017-02-08 DIAGNOSIS — E871 Hypo-osmolality and hyponatremia: Secondary | ICD-10-CM | POA: Diagnosis not present

## 2017-02-08 DIAGNOSIS — R7989 Other specified abnormal findings of blood chemistry: Secondary | ICD-10-CM | POA: Diagnosis not present

## 2017-02-08 DIAGNOSIS — J9621 Acute and chronic respiratory failure with hypoxia: Secondary | ICD-10-CM | POA: Diagnosis not present

## 2017-02-08 DIAGNOSIS — R0602 Shortness of breath: Secondary | ICD-10-CM | POA: Diagnosis not present

## 2017-02-08 DIAGNOSIS — I5023 Acute on chronic systolic (congestive) heart failure: Secondary | ICD-10-CM | POA: Diagnosis not present

## 2017-02-08 DIAGNOSIS — F068 Other specified mental disorders due to known physiological condition: Secondary | ICD-10-CM | POA: Diagnosis not present

## 2017-02-09 DIAGNOSIS — F068 Other specified mental disorders due to known physiological condition: Secondary | ICD-10-CM | POA: Diagnosis not present

## 2017-02-09 DIAGNOSIS — I5023 Acute on chronic systolic (congestive) heart failure: Secondary | ICD-10-CM | POA: Diagnosis not present

## 2017-02-09 DIAGNOSIS — E871 Hypo-osmolality and hyponatremia: Secondary | ICD-10-CM | POA: Diagnosis not present

## 2017-02-09 DIAGNOSIS — R0602 Shortness of breath: Secondary | ICD-10-CM | POA: Diagnosis not present

## 2017-02-09 DIAGNOSIS — J9621 Acute and chronic respiratory failure with hypoxia: Secondary | ICD-10-CM | POA: Diagnosis not present

## 2017-02-09 DIAGNOSIS — R7989 Other specified abnormal findings of blood chemistry: Secondary | ICD-10-CM | POA: Diagnosis not present

## 2017-02-11 ENCOUNTER — Telehealth: Payer: Self-pay | Admitting: Interventional Cardiology

## 2017-02-11 MED ORDER — METOLAZONE 2.5 MG PO TABS: 2.5000 mg | ORAL_TABLET | Freq: Once | ORAL | 0 refills | Status: DC

## 2017-02-11 NOTE — Telephone Encounter (Signed)
Spoke with Hospice nurse.  Wife reported to her that pt's face has been swollen x 2-3 days now.  Hospice nurse was in earlier this week and this was not visible to her, so this is new.  Pt hasn't urinated in 6 hrs.  No vitals or wts available.  Last night pt had a fever, was given Tylenol and fever broke.  Pt got up this AM and ate breakfast, feeling better.  Pt currently taking Lasix 80mg  QD.  Hospice nurse wanted to know if Lasix should be increased for a few days?  Spoke with daughter and wife, ok per DPR.  Wife states last night and this morning pt has had some rattling in chest and is a little more SOB than usual.  Will route to Dr. Tamala Julian for review and advisement.

## 2017-02-11 NOTE — Telephone Encounter (Signed)
F/U Call:  Patient wife calling back states that she would like an update on what she should do.

## 2017-02-11 NOTE — Telephone Encounter (Signed)
New message  [name]; pt hospice Nurse call requesting to speak with RN. She state pt has not urinated in the last 6 hours. She states pt is on lasix 80mg . She states pt wife states pt has swelling in the face and neck. Pt wife would like change dosage if necessary. Please call back to discuss

## 2017-02-11 NOTE — Telephone Encounter (Signed)
Take metolazone 2.5 mg and 30 minutes later 80 mg of furosemide. This should be done once only been back to the typical furosemide dose.

## 2017-02-11 NOTE — Telephone Encounter (Signed)
Spoke with wife and made her aware of recommendations per Dr. Tamala Julian.  Wife verbalized understanding and was in agreement with this plan. Per Dr. Tamala Julian, ok to send in 5 tablets but pt should only use this one time for now.   Sharilyn Sites, Hospice nurse as well.

## 2017-02-12 DIAGNOSIS — F068 Other specified mental disorders due to known physiological condition: Secondary | ICD-10-CM | POA: Diagnosis not present

## 2017-02-12 DIAGNOSIS — H3530 Unspecified macular degeneration: Secondary | ICD-10-CM | POA: Diagnosis not present

## 2017-02-12 DIAGNOSIS — I5023 Acute on chronic systolic (congestive) heart failure: Secondary | ICD-10-CM | POA: Diagnosis not present

## 2017-02-12 DIAGNOSIS — Z7901 Long term (current) use of anticoagulants: Secondary | ICD-10-CM | POA: Diagnosis not present

## 2017-02-12 DIAGNOSIS — I351 Nonrheumatic aortic (valve) insufficiency: Secondary | ICD-10-CM | POA: Diagnosis not present

## 2017-02-12 DIAGNOSIS — I951 Orthostatic hypotension: Secondary | ICD-10-CM | POA: Diagnosis not present

## 2017-02-12 DIAGNOSIS — G2581 Restless legs syndrome: Secondary | ICD-10-CM | POA: Diagnosis not present

## 2017-02-12 DIAGNOSIS — J9621 Acute and chronic respiratory failure with hypoxia: Secondary | ICD-10-CM | POA: Diagnosis not present

## 2017-02-12 DIAGNOSIS — I482 Chronic atrial fibrillation: Secondary | ICD-10-CM | POA: Diagnosis not present

## 2017-02-12 DIAGNOSIS — I4892 Unspecified atrial flutter: Secondary | ICD-10-CM | POA: Diagnosis not present

## 2017-02-12 DIAGNOSIS — I071 Rheumatic tricuspid insufficiency: Secondary | ICD-10-CM | POA: Diagnosis not present

## 2017-02-12 DIAGNOSIS — K269 Duodenal ulcer, unspecified as acute or chronic, without hemorrhage or perforation: Secondary | ICD-10-CM | POA: Diagnosis not present

## 2017-02-12 DIAGNOSIS — I340 Nonrheumatic mitral (valve) insufficiency: Secondary | ICD-10-CM | POA: Diagnosis not present

## 2017-02-12 DIAGNOSIS — E039 Hypothyroidism, unspecified: Secondary | ICD-10-CM | POA: Diagnosis not present

## 2017-02-12 DIAGNOSIS — J811 Chronic pulmonary edema: Secondary | ICD-10-CM | POA: Diagnosis not present

## 2017-02-12 DIAGNOSIS — I442 Atrioventricular block, complete: Secondary | ICD-10-CM | POA: Diagnosis not present

## 2017-02-12 DIAGNOSIS — K209 Esophagitis, unspecified: Secondary | ICD-10-CM | POA: Diagnosis not present

## 2017-02-12 DIAGNOSIS — R7989 Other specified abnormal findings of blood chemistry: Secondary | ICD-10-CM | POA: Diagnosis not present

## 2017-02-12 DIAGNOSIS — E871 Hypo-osmolality and hyponatremia: Secondary | ICD-10-CM | POA: Diagnosis not present

## 2017-02-12 DIAGNOSIS — D509 Iron deficiency anemia, unspecified: Secondary | ICD-10-CM | POA: Diagnosis not present

## 2017-02-12 DIAGNOSIS — D649 Anemia, unspecified: Secondary | ICD-10-CM | POA: Diagnosis not present

## 2017-02-12 DIAGNOSIS — R0602 Shortness of breath: Secondary | ICD-10-CM | POA: Diagnosis not present

## 2017-02-12 DIAGNOSIS — Z950 Presence of cardiac pacemaker: Secondary | ICD-10-CM | POA: Diagnosis not present

## 2017-02-12 DIAGNOSIS — G629 Polyneuropathy, unspecified: Secondary | ICD-10-CM | POA: Diagnosis not present

## 2017-02-12 DIAGNOSIS — K5790 Diverticulosis of intestine, part unspecified, without perforation or abscess without bleeding: Secondary | ICD-10-CM | POA: Diagnosis not present

## 2017-02-16 DIAGNOSIS — E871 Hypo-osmolality and hyponatremia: Secondary | ICD-10-CM | POA: Diagnosis not present

## 2017-02-16 DIAGNOSIS — I5023 Acute on chronic systolic (congestive) heart failure: Secondary | ICD-10-CM | POA: Diagnosis not present

## 2017-02-16 DIAGNOSIS — J9621 Acute and chronic respiratory failure with hypoxia: Secondary | ICD-10-CM | POA: Diagnosis not present

## 2017-02-16 DIAGNOSIS — R7989 Other specified abnormal findings of blood chemistry: Secondary | ICD-10-CM | POA: Diagnosis not present

## 2017-02-16 DIAGNOSIS — F068 Other specified mental disorders due to known physiological condition: Secondary | ICD-10-CM | POA: Diagnosis not present

## 2017-02-16 DIAGNOSIS — R0602 Shortness of breath: Secondary | ICD-10-CM | POA: Diagnosis not present

## 2017-02-18 DIAGNOSIS — F068 Other specified mental disorders due to known physiological condition: Secondary | ICD-10-CM | POA: Diagnosis not present

## 2017-02-18 DIAGNOSIS — R7989 Other specified abnormal findings of blood chemistry: Secondary | ICD-10-CM | POA: Diagnosis not present

## 2017-02-18 DIAGNOSIS — J9621 Acute and chronic respiratory failure with hypoxia: Secondary | ICD-10-CM | POA: Diagnosis not present

## 2017-02-18 DIAGNOSIS — I5023 Acute on chronic systolic (congestive) heart failure: Secondary | ICD-10-CM | POA: Diagnosis not present

## 2017-02-18 DIAGNOSIS — R0602 Shortness of breath: Secondary | ICD-10-CM | POA: Diagnosis not present

## 2017-02-18 DIAGNOSIS — E871 Hypo-osmolality and hyponatremia: Secondary | ICD-10-CM | POA: Diagnosis not present

## 2017-02-21 ENCOUNTER — Telehealth: Payer: Self-pay | Admitting: Interventional Cardiology

## 2017-02-21 DIAGNOSIS — F068 Other specified mental disorders due to known physiological condition: Secondary | ICD-10-CM | POA: Diagnosis not present

## 2017-02-21 DIAGNOSIS — E871 Hypo-osmolality and hyponatremia: Secondary | ICD-10-CM | POA: Diagnosis not present

## 2017-02-21 DIAGNOSIS — I5023 Acute on chronic systolic (congestive) heart failure: Secondary | ICD-10-CM | POA: Diagnosis not present

## 2017-02-21 DIAGNOSIS — R7989 Other specified abnormal findings of blood chemistry: Secondary | ICD-10-CM | POA: Diagnosis not present

## 2017-02-21 DIAGNOSIS — R0602 Shortness of breath: Secondary | ICD-10-CM | POA: Diagnosis not present

## 2017-02-21 DIAGNOSIS — J9621 Acute and chronic respiratory failure with hypoxia: Secondary | ICD-10-CM | POA: Diagnosis not present

## 2017-02-21 NOTE — Telephone Encounter (Signed)
Wife states that pt has not urinated since sometime yesterday.  Was dry all night and has yet to urinate today.  Denies any swelling.  States pt ate dinner last night and breakfast this morning and has taken in plenty of fluids.  Wife would like to know if Dr. Tamala Julian would give him Metolazone again to see if that will help?  Advised wife that I would send message to Dr. Tamala Julian for review and advisement.  Also encouraged she give the Hospice team a call as they may need to do an I&O cath on pt.  Wife states she will call.

## 2017-02-21 NOTE — Telephone Encounter (Signed)
New Message     Per wife patient has stopped urinating again, can you give her orders for the medication you gave him last time so that they can try it again to see if it helps.

## 2017-02-22 NOTE — Telephone Encounter (Signed)
What is his weight? Does hew have swelling?

## 2017-02-23 DIAGNOSIS — J9621 Acute and chronic respiratory failure with hypoxia: Secondary | ICD-10-CM | POA: Diagnosis not present

## 2017-02-23 DIAGNOSIS — E871 Hypo-osmolality and hyponatremia: Secondary | ICD-10-CM | POA: Diagnosis not present

## 2017-02-23 DIAGNOSIS — F068 Other specified mental disorders due to known physiological condition: Secondary | ICD-10-CM | POA: Diagnosis not present

## 2017-02-23 DIAGNOSIS — R0602 Shortness of breath: Secondary | ICD-10-CM | POA: Diagnosis not present

## 2017-02-23 DIAGNOSIS — I5023 Acute on chronic systolic (congestive) heart failure: Secondary | ICD-10-CM | POA: Diagnosis not present

## 2017-02-23 DIAGNOSIS — R7989 Other specified abnormal findings of blood chemistry: Secondary | ICD-10-CM | POA: Diagnosis not present

## 2017-02-24 DIAGNOSIS — R0602 Shortness of breath: Secondary | ICD-10-CM | POA: Diagnosis not present

## 2017-02-24 DIAGNOSIS — E871 Hypo-osmolality and hyponatremia: Secondary | ICD-10-CM | POA: Diagnosis not present

## 2017-02-24 DIAGNOSIS — J9621 Acute and chronic respiratory failure with hypoxia: Secondary | ICD-10-CM | POA: Diagnosis not present

## 2017-02-24 DIAGNOSIS — F068 Other specified mental disorders due to known physiological condition: Secondary | ICD-10-CM | POA: Diagnosis not present

## 2017-02-24 DIAGNOSIS — R7989 Other specified abnormal findings of blood chemistry: Secondary | ICD-10-CM | POA: Diagnosis not present

## 2017-02-24 DIAGNOSIS — I5023 Acute on chronic systolic (congestive) heart failure: Secondary | ICD-10-CM | POA: Diagnosis not present

## 2017-02-24 NOTE — Telephone Encounter (Signed)
Spoke with wife and she states has been urinating since we last spoke.  States they are unable to weigh him.  Denies swelling.  Spoke with Dr. Tamala Julian and he said just continue with current medications at this time.  Advised wife of this and to call if any further issues.  Wife verbalized understanding and was appreciative for call.

## 2017-03-01 DIAGNOSIS — J9621 Acute and chronic respiratory failure with hypoxia: Secondary | ICD-10-CM | POA: Diagnosis not present

## 2017-03-01 DIAGNOSIS — F068 Other specified mental disorders due to known physiological condition: Secondary | ICD-10-CM | POA: Diagnosis not present

## 2017-03-01 DIAGNOSIS — I5023 Acute on chronic systolic (congestive) heart failure: Secondary | ICD-10-CM | POA: Diagnosis not present

## 2017-03-01 DIAGNOSIS — R0602 Shortness of breath: Secondary | ICD-10-CM | POA: Diagnosis not present

## 2017-03-01 DIAGNOSIS — E871 Hypo-osmolality and hyponatremia: Secondary | ICD-10-CM | POA: Diagnosis not present

## 2017-03-01 DIAGNOSIS — R7989 Other specified abnormal findings of blood chemistry: Secondary | ICD-10-CM | POA: Diagnosis not present

## 2017-03-09 ENCOUNTER — Telehealth: Payer: Self-pay | Admitting: Interventional Cardiology

## 2017-03-09 DIAGNOSIS — R7989 Other specified abnormal findings of blood chemistry: Secondary | ICD-10-CM | POA: Diagnosis not present

## 2017-03-09 DIAGNOSIS — R0602 Shortness of breath: Secondary | ICD-10-CM | POA: Diagnosis not present

## 2017-03-09 DIAGNOSIS — F068 Other specified mental disorders due to known physiological condition: Secondary | ICD-10-CM | POA: Diagnosis not present

## 2017-03-09 DIAGNOSIS — I5023 Acute on chronic systolic (congestive) heart failure: Secondary | ICD-10-CM | POA: Diagnosis not present

## 2017-03-09 DIAGNOSIS — E871 Hypo-osmolality and hyponatremia: Secondary | ICD-10-CM | POA: Diagnosis not present

## 2017-03-09 DIAGNOSIS — J9621 Acute and chronic respiratory failure with hypoxia: Secondary | ICD-10-CM | POA: Diagnosis not present

## 2017-03-09 NOTE — Telephone Encounter (Signed)
Called Denise with Hospice. Langley Gauss is in the home at this time. She stated patient is c/o SOB and swelling in feet and neck. Patient takes Lasix 40 mg BID. Patient's wife is wanting to know if patient can take an metolazone 2.5 mg. Will consult DOD.  Consulted Dr. Rayann Heman DOD, patient can take metolazone 2.5 mg today and to make Dr. Tamala Julian aware.  Called Denise back about Dr. Jackalyn Lombard recommendation. Langley Gauss verbalized understanding and will make patient and his wife aware.

## 2017-03-09 NOTE — Telephone Encounter (Signed)
New message    Langley Gauss from [facility] is calling.  Pt c/o swelling: STAT is pt has developed SOB within 24 hours  1. How long have you been experiencing swelling? This morning in his feet and his neck 2 days  2. Where is the swelling located? Feet, under his neck  3.  Are you currently taking a "fluid pill"? Lasix-is it ok to give extra medication for swelling  4.  Are you currently SOB? Per pt no, per wife she has noticed an increase  5.  Have you traveled recently? No

## 2017-03-10 ENCOUNTER — Telehealth: Payer: Self-pay | Admitting: Cardiology

## 2017-03-10 ENCOUNTER — Encounter: Payer: Medicare Other | Admitting: *Deleted

## 2017-03-10 DIAGNOSIS — E871 Hypo-osmolality and hyponatremia: Secondary | ICD-10-CM | POA: Diagnosis not present

## 2017-03-10 DIAGNOSIS — R7989 Other specified abnormal findings of blood chemistry: Secondary | ICD-10-CM | POA: Diagnosis not present

## 2017-03-10 DIAGNOSIS — J9621 Acute and chronic respiratory failure with hypoxia: Secondary | ICD-10-CM | POA: Diagnosis not present

## 2017-03-10 DIAGNOSIS — F068 Other specified mental disorders due to known physiological condition: Secondary | ICD-10-CM | POA: Diagnosis not present

## 2017-03-10 DIAGNOSIS — R0602 Shortness of breath: Secondary | ICD-10-CM | POA: Diagnosis not present

## 2017-03-10 DIAGNOSIS — I5023 Acute on chronic systolic (congestive) heart failure: Secondary | ICD-10-CM | POA: Diagnosis not present

## 2017-03-10 NOTE — Telephone Encounter (Signed)
Needs BMET 

## 2017-03-10 NOTE — Telephone Encounter (Signed)
Confirmed remote transmission w/ pt caregiver.   

## 2017-03-11 NOTE — Telephone Encounter (Signed)
Left message with Langley Gauss with Hospice to call back.

## 2017-03-14 ENCOUNTER — Other Ambulatory Visit: Payer: Self-pay | Admitting: Internal Medicine

## 2017-03-14 DIAGNOSIS — I4892 Unspecified atrial flutter: Secondary | ICD-10-CM | POA: Diagnosis not present

## 2017-03-14 DIAGNOSIS — I442 Atrioventricular block, complete: Secondary | ICD-10-CM | POA: Diagnosis not present

## 2017-03-14 DIAGNOSIS — J811 Chronic pulmonary edema: Secondary | ICD-10-CM | POA: Diagnosis not present

## 2017-03-14 DIAGNOSIS — Z7901 Long term (current) use of anticoagulants: Secondary | ICD-10-CM | POA: Diagnosis not present

## 2017-03-14 DIAGNOSIS — K5790 Diverticulosis of intestine, part unspecified, without perforation or abscess without bleeding: Secondary | ICD-10-CM | POA: Diagnosis not present

## 2017-03-14 DIAGNOSIS — I351 Nonrheumatic aortic (valve) insufficiency: Secondary | ICD-10-CM | POA: Diagnosis not present

## 2017-03-14 DIAGNOSIS — K209 Esophagitis, unspecified: Secondary | ICD-10-CM | POA: Diagnosis not present

## 2017-03-14 DIAGNOSIS — I071 Rheumatic tricuspid insufficiency: Secondary | ICD-10-CM | POA: Diagnosis not present

## 2017-03-14 DIAGNOSIS — G2581 Restless legs syndrome: Secondary | ICD-10-CM | POA: Diagnosis not present

## 2017-03-14 DIAGNOSIS — G629 Polyneuropathy, unspecified: Secondary | ICD-10-CM | POA: Diagnosis not present

## 2017-03-14 DIAGNOSIS — D509 Iron deficiency anemia, unspecified: Secondary | ICD-10-CM | POA: Diagnosis not present

## 2017-03-14 DIAGNOSIS — Z950 Presence of cardiac pacemaker: Secondary | ICD-10-CM | POA: Diagnosis not present

## 2017-03-14 DIAGNOSIS — D649 Anemia, unspecified: Secondary | ICD-10-CM | POA: Diagnosis not present

## 2017-03-14 DIAGNOSIS — I482 Chronic atrial fibrillation: Secondary | ICD-10-CM | POA: Diagnosis not present

## 2017-03-14 DIAGNOSIS — R7989 Other specified abnormal findings of blood chemistry: Secondary | ICD-10-CM | POA: Diagnosis not present

## 2017-03-14 DIAGNOSIS — H3530 Unspecified macular degeneration: Secondary | ICD-10-CM | POA: Diagnosis not present

## 2017-03-14 DIAGNOSIS — F068 Other specified mental disorders due to known physiological condition: Secondary | ICD-10-CM | POA: Diagnosis not present

## 2017-03-14 DIAGNOSIS — I340 Nonrheumatic mitral (valve) insufficiency: Secondary | ICD-10-CM | POA: Diagnosis not present

## 2017-03-14 DIAGNOSIS — J9621 Acute and chronic respiratory failure with hypoxia: Secondary | ICD-10-CM | POA: Diagnosis not present

## 2017-03-14 DIAGNOSIS — E871 Hypo-osmolality and hyponatremia: Secondary | ICD-10-CM | POA: Diagnosis not present

## 2017-03-14 DIAGNOSIS — I5023 Acute on chronic systolic (congestive) heart failure: Secondary | ICD-10-CM | POA: Diagnosis not present

## 2017-03-14 DIAGNOSIS — K269 Duodenal ulcer, unspecified as acute or chronic, without hemorrhage or perforation: Secondary | ICD-10-CM | POA: Diagnosis not present

## 2017-03-14 DIAGNOSIS — I951 Orthostatic hypotension: Secondary | ICD-10-CM | POA: Diagnosis not present

## 2017-03-14 DIAGNOSIS — E039 Hypothyroidism, unspecified: Secondary | ICD-10-CM | POA: Diagnosis not present

## 2017-03-14 DIAGNOSIS — R0602 Shortness of breath: Secondary | ICD-10-CM | POA: Diagnosis not present

## 2017-03-14 NOTE — Telephone Encounter (Signed)
Single chamber ppm. 6.6-7.1 years remaining on device (as of 12/09/16). PPM dependent. Patient does not have an RF device. Patient's wife wants to d/c remote checks bc patient is "getting weaker and they don't think he will make it much longer".  Will forward to Dr.Allred to see if patient can d/c remote checks.

## 2017-03-14 NOTE — Telephone Encounter (Signed)
Patients preference is noted.  We will respect their wishes.   We should see in the office or do remote at least once a year if family is willing.

## 2017-03-14 NOTE — Telephone Encounter (Signed)
°  New Prob  Wife state pt is currently on hospice care. Calling to speak to device rep regarding device checks and if checks are absolutely necessary at this time. Please call.

## 2017-03-15 NOTE — Telephone Encounter (Signed)
Spoke with Langley Gauss at [facility] and she will get BMET order over to the nurse that is seeing pt this week and have it drawn.  They will fax results to our office once completed.

## 2017-03-16 DIAGNOSIS — I5023 Acute on chronic systolic (congestive) heart failure: Secondary | ICD-10-CM | POA: Diagnosis not present

## 2017-03-16 DIAGNOSIS — E871 Hypo-osmolality and hyponatremia: Secondary | ICD-10-CM | POA: Diagnosis not present

## 2017-03-16 DIAGNOSIS — R0602 Shortness of breath: Secondary | ICD-10-CM | POA: Diagnosis not present

## 2017-03-16 DIAGNOSIS — F068 Other specified mental disorders due to known physiological condition: Secondary | ICD-10-CM | POA: Diagnosis not present

## 2017-03-16 DIAGNOSIS — R7989 Other specified abnormal findings of blood chemistry: Secondary | ICD-10-CM | POA: Diagnosis not present

## 2017-03-16 DIAGNOSIS — J9621 Acute and chronic respiratory failure with hypoxia: Secondary | ICD-10-CM | POA: Diagnosis not present

## 2017-03-16 NOTE — Telephone Encounter (Signed)
[name] is appreciative- she reports that Hospice RN is supposed to evaluate [name] blood sugar to see if these "spell" of sweating and weakness could be related to hypoglycemia. If that is the case she will call back to schedule annual remote checks. She is appreciative.

## 2017-03-21 DIAGNOSIS — R0602 Shortness of breath: Secondary | ICD-10-CM | POA: Diagnosis not present

## 2017-03-21 DIAGNOSIS — F068 Other specified mental disorders due to known physiological condition: Secondary | ICD-10-CM | POA: Diagnosis not present

## 2017-03-21 DIAGNOSIS — E871 Hypo-osmolality and hyponatremia: Secondary | ICD-10-CM | POA: Diagnosis not present

## 2017-03-21 DIAGNOSIS — I5023 Acute on chronic systolic (congestive) heart failure: Secondary | ICD-10-CM | POA: Diagnosis not present

## 2017-03-21 DIAGNOSIS — R7989 Other specified abnormal findings of blood chemistry: Secondary | ICD-10-CM | POA: Diagnosis not present

## 2017-03-21 DIAGNOSIS — J9621 Acute and chronic respiratory failure with hypoxia: Secondary | ICD-10-CM | POA: Diagnosis not present

## 2017-03-23 DIAGNOSIS — R7989 Other specified abnormal findings of blood chemistry: Secondary | ICD-10-CM | POA: Diagnosis not present

## 2017-03-23 DIAGNOSIS — J9621 Acute and chronic respiratory failure with hypoxia: Secondary | ICD-10-CM | POA: Diagnosis not present

## 2017-03-23 DIAGNOSIS — F068 Other specified mental disorders due to known physiological condition: Secondary | ICD-10-CM | POA: Diagnosis not present

## 2017-03-23 DIAGNOSIS — R0602 Shortness of breath: Secondary | ICD-10-CM | POA: Diagnosis not present

## 2017-03-23 DIAGNOSIS — I5023 Acute on chronic systolic (congestive) heart failure: Secondary | ICD-10-CM | POA: Diagnosis not present

## 2017-03-23 DIAGNOSIS — E871 Hypo-osmolality and hyponatremia: Secondary | ICD-10-CM | POA: Diagnosis not present

## 2017-03-23 NOTE — Telephone Encounter (Signed)
Spoke with Hospice nurse and she states that pt's wife refused BMET last week and asked that only INRs be drawn.  Nurse will speak with wife again tomorrow when she sees pt and try to get BMET.  She will call me and let me know if wife still refuses.

## 2017-03-24 DIAGNOSIS — I5023 Acute on chronic systolic (congestive) heart failure: Secondary | ICD-10-CM | POA: Diagnosis not present

## 2017-03-24 DIAGNOSIS — E871 Hypo-osmolality and hyponatremia: Secondary | ICD-10-CM | POA: Diagnosis not present

## 2017-03-24 DIAGNOSIS — R0602 Shortness of breath: Secondary | ICD-10-CM | POA: Diagnosis not present

## 2017-03-24 DIAGNOSIS — F068 Other specified mental disorders due to known physiological condition: Secondary | ICD-10-CM | POA: Diagnosis not present

## 2017-03-24 DIAGNOSIS — J9621 Acute and chronic respiratory failure with hypoxia: Secondary | ICD-10-CM | POA: Diagnosis not present

## 2017-03-24 DIAGNOSIS — R7989 Other specified abnormal findings of blood chemistry: Secondary | ICD-10-CM | POA: Diagnosis not present

## 2017-03-25 DIAGNOSIS — J9621 Acute and chronic respiratory failure with hypoxia: Secondary | ICD-10-CM | POA: Diagnosis not present

## 2017-03-25 DIAGNOSIS — R0602 Shortness of breath: Secondary | ICD-10-CM | POA: Diagnosis not present

## 2017-03-25 DIAGNOSIS — I5023 Acute on chronic systolic (congestive) heart failure: Secondary | ICD-10-CM | POA: Diagnosis not present

## 2017-03-25 DIAGNOSIS — F068 Other specified mental disorders due to known physiological condition: Secondary | ICD-10-CM | POA: Diagnosis not present

## 2017-03-25 DIAGNOSIS — R7989 Other specified abnormal findings of blood chemistry: Secondary | ICD-10-CM | POA: Diagnosis not present

## 2017-03-25 DIAGNOSIS — E871 Hypo-osmolality and hyponatremia: Secondary | ICD-10-CM | POA: Diagnosis not present

## 2017-03-26 ENCOUNTER — Observation Stay (HOSPITAL_COMMUNITY)
Admission: EM | Admit: 2017-03-26 | Discharge: 2017-03-28 | Disposition: A | Discharge status: Skilled Nursing Facility | Payer: Medicare Other | Attending: Internal Medicine | Admitting: Internal Medicine

## 2017-03-26 ENCOUNTER — Emergency Department (HOSPITAL_COMMUNITY): Payer: Medicare Other

## 2017-03-26 ENCOUNTER — Encounter (HOSPITAL_COMMUNITY): Payer: Self-pay | Admitting: *Deleted

## 2017-03-26 DIAGNOSIS — E039 Hypothyroidism, unspecified: Secondary | ICD-10-CM | POA: Diagnosis not present

## 2017-03-26 DIAGNOSIS — Z87891 Personal history of nicotine dependence: Secondary | ICD-10-CM | POA: Diagnosis not present

## 2017-03-26 DIAGNOSIS — R627 Adult failure to thrive: Secondary | ICD-10-CM | POA: Diagnosis not present

## 2017-03-26 DIAGNOSIS — J9621 Acute and chronic respiratory failure with hypoxia: Secondary | ICD-10-CM | POA: Diagnosis not present

## 2017-03-26 DIAGNOSIS — R7989 Other specified abnormal findings of blood chemistry: Secondary | ICD-10-CM | POA: Diagnosis not present

## 2017-03-26 DIAGNOSIS — F0390 Unspecified dementia without behavioral disturbance: Secondary | ICD-10-CM | POA: Insufficient documentation

## 2017-03-26 DIAGNOSIS — R9431 Abnormal electrocardiogram [ECG] [EKG]: Secondary | ICD-10-CM | POA: Diagnosis not present

## 2017-03-26 DIAGNOSIS — R52 Pain, unspecified: Secondary | ICD-10-CM | POA: Diagnosis not present

## 2017-03-26 DIAGNOSIS — I482 Chronic atrial fibrillation: Secondary | ICD-10-CM | POA: Diagnosis not present

## 2017-03-26 DIAGNOSIS — Z66 Do not resuscitate: Secondary | ICD-10-CM | POA: Insufficient documentation

## 2017-03-26 DIAGNOSIS — I10 Essential (primary) hypertension: Secondary | ICD-10-CM | POA: Diagnosis present

## 2017-03-26 DIAGNOSIS — Z7901 Long term (current) use of anticoagulants: Secondary | ICD-10-CM | POA: Diagnosis not present

## 2017-03-26 DIAGNOSIS — Z79899 Other long term (current) drug therapy: Secondary | ICD-10-CM | POA: Diagnosis not present

## 2017-03-26 DIAGNOSIS — R531 Weakness: Secondary | ICD-10-CM

## 2017-03-26 DIAGNOSIS — E43 Unspecified severe protein-calorie malnutrition: Secondary | ICD-10-CM | POA: Insufficient documentation

## 2017-03-26 DIAGNOSIS — I350 Nonrheumatic aortic (valve) stenosis: Secondary | ICD-10-CM | POA: Diagnosis not present

## 2017-03-26 DIAGNOSIS — E871 Hypo-osmolality and hyponatremia: Secondary | ICD-10-CM | POA: Insufficient documentation

## 2017-03-26 DIAGNOSIS — G2581 Restless legs syndrome: Secondary | ICD-10-CM | POA: Diagnosis not present

## 2017-03-26 DIAGNOSIS — I351 Nonrheumatic aortic (valve) insufficiency: Secondary | ICD-10-CM | POA: Diagnosis present

## 2017-03-26 DIAGNOSIS — R031 Nonspecific low blood-pressure reading: Secondary | ICD-10-CM | POA: Diagnosis not present

## 2017-03-26 DIAGNOSIS — I442 Atrioventricular block, complete: Secondary | ICD-10-CM | POA: Diagnosis present

## 2017-03-26 DIAGNOSIS — Z950 Presence of cardiac pacemaker: Secondary | ICD-10-CM | POA: Diagnosis not present

## 2017-03-26 DIAGNOSIS — R34 Anuria and oliguria: Secondary | ICD-10-CM | POA: Diagnosis present

## 2017-03-26 DIAGNOSIS — Z880 Allergy status to penicillin: Secondary | ICD-10-CM | POA: Diagnosis not present

## 2017-03-26 DIAGNOSIS — Z515 Encounter for palliative care: Secondary | ICD-10-CM

## 2017-03-26 DIAGNOSIS — Z7189 Other specified counseling: Secondary | ICD-10-CM

## 2017-03-26 DIAGNOSIS — D509 Iron deficiency anemia, unspecified: Secondary | ICD-10-CM | POA: Insufficient documentation

## 2017-03-26 DIAGNOSIS — I272 Pulmonary hypertension, unspecified: Secondary | ICD-10-CM | POA: Diagnosis not present

## 2017-03-26 DIAGNOSIS — D649 Anemia, unspecified: Secondary | ICD-10-CM | POA: Diagnosis present

## 2017-03-26 DIAGNOSIS — Z23 Encounter for immunization: Secondary | ICD-10-CM | POA: Insufficient documentation

## 2017-03-26 DIAGNOSIS — I110 Hypertensive heart disease with heart failure: Principal | ICD-10-CM | POA: Insufficient documentation

## 2017-03-26 DIAGNOSIS — F068 Other specified mental disorders due to known physiological condition: Secondary | ICD-10-CM | POA: Diagnosis not present

## 2017-03-26 DIAGNOSIS — I5023 Acute on chronic systolic (congestive) heart failure: Secondary | ICD-10-CM | POA: Diagnosis not present

## 2017-03-26 DIAGNOSIS — R0602 Shortness of breath: Secondary | ICD-10-CM | POA: Diagnosis not present

## 2017-03-26 DIAGNOSIS — G9340 Encephalopathy, unspecified: Secondary | ICD-10-CM | POA: Insufficient documentation

## 2017-03-26 LAB — COMPREHENSIVE METABOLIC PANEL
ALBUMIN: 2.9 g/dL — AB (ref 3.5–5.0)
ALK PHOS: 57 U/L (ref 38–126)
ALT: 12 U/L — ABNORMAL LOW (ref 17–63)
AST: 19 U/L (ref 15–41)
Anion gap: 7 (ref 5–15)
BILIRUBIN TOTAL: 1.2 mg/dL (ref 0.3–1.2)
BUN: 38 mg/dL — AB (ref 6–20)
CALCIUM: 8.3 mg/dL — AB (ref 8.9–10.3)
CO2: 29 mmol/L (ref 22–32)
CREATININE: 1.04 mg/dL (ref 0.61–1.24)
Chloride: 100 mmol/L — ABNORMAL LOW (ref 101–111)
GFR calc Af Amer: >60 mL/min (ref >60)
GLUCOSE: 93 mg/dL (ref 65–99)
POTASSIUM: 3.3 mmol/L — AB (ref 3.5–5.1)
Sodium: 136 mmol/L (ref 135–145)
TOTAL PROTEIN: 6.6 g/dL (ref 6.5–8.1)

## 2017-03-26 LAB — I-STAT TROPONIN, ED: TROPONIN I, POC: 0 ng/mL (ref 0.00–0.08)

## 2017-03-26 LAB — CBC WITH DIFFERENTIAL/PLATELET
BASOS PCT: 1 %
Basophils Absolute: 0 10*3/uL (ref 0.0–0.1)
EOS ABS: 0.2 10*3/uL (ref 0.0–0.7)
EOS PCT: 4 %
HCT: 35.2 % — ABNORMAL LOW (ref 39.0–52.0)
Hemoglobin: 11.1 g/dL — ABNORMAL LOW (ref 13.0–17.0)
LYMPHS PCT: 24 %
Lymphs Abs: 1.3 10*3/uL (ref 0.7–4.0)
MCH: 30.9 pg (ref 26.0–34.0)
MCHC: 31.5 g/dL (ref 30.0–36.0)
MCV: 98.1 fL (ref 78.0–100.0)
MONO ABS: 0.5 10*3/uL (ref 0.1–1.0)
Monocytes Relative: 9 %
Neutro Abs: 3.2 10*3/uL (ref 1.7–7.7)
Neutrophils Relative %: 62 %
Platelets: 187 10*3/uL (ref 150–400)
RBC: 3.59 MIL/uL — AB (ref 4.22–5.81)
RDW: 17 % — AB (ref 11.5–15.5)
WBC: 5.2 10*3/uL (ref 4.0–10.5)

## 2017-03-26 LAB — URINALYSIS, COMPLETE (UACMP) WITH MICROSCOPIC
BILIRUBIN URINE: NEGATIVE
Bacteria, UA: NONE SEEN
GLUCOSE, UA: NEGATIVE mg/dL
Hgb urine dipstick: NEGATIVE
KETONES UR: NEGATIVE mg/dL
LEUKOCYTES UA: NEGATIVE
NITRITE: NEGATIVE
PH: 7 (ref 5.0–8.0)
Protein, ur: NEGATIVE mg/dL
SPECIFIC GRAVITY, URINE: 1.01 (ref 1.005–1.030)

## 2017-03-26 LAB — PROTIME-INR
INR: 3.36
Prothrombin Time: 33.8 seconds — ABNORMAL HIGH (ref 11.4–15.2)

## 2017-03-26 LAB — BRAIN NATRIURETIC PEPTIDE: B Natriuretic Peptide: 896.8 pg/mL — ABNORMAL HIGH (ref 0.0–100.0)

## 2017-03-26 LAB — I-STAT CG4 LACTIC ACID, ED: Lactic Acid, Venous: 0.75 mmol/L (ref 0.5–1.9)

## 2017-03-26 MED ORDER — FUROSEMIDE 10 MG/ML IJ SOLN: 40.0000 mg | Freq: Once | INTRAMUSCULAR | Status: DC

## 2017-03-26 MED ORDER — LEVOTHYROXINE SODIUM 75 MCG PO TABS
75.0000 ug | ORAL_TABLET | Freq: Every day | ORAL | Status: DC
  Administered 2017-03-27 – 2017-03-28 (×2): 75 ug via ORAL
  Filled 2017-03-26 (×3): qty 1

## 2017-03-26 MED ORDER — SODIUM CHLORIDE 0.9 % IV BOLUS (SEPSIS)
500.0000 mL | Freq: Once | INTRAVENOUS | Status: DC
Start: 2017-03-26 — End: 2017-03-26

## 2017-03-26 MED ORDER — FUROSEMIDE 10 MG/ML IJ SOLN
40.0000 mg | Freq: Once | INTRAMUSCULAR | Status: AC
  Administered 2017-03-26: 40 mg via INTRAVENOUS
  Filled 2017-03-26: qty 4

## 2017-03-26 MED ORDER — ORAL CARE MOUTH RINSE
15.0000 mL | Freq: Two times a day (BID) | OROMUCOSAL | Status: DC
  Administered 2017-03-27: 15 mL via OROMUCOSAL

## 2017-03-26 MED ORDER — DEXTROSE-NACL 5-0.45 % IV SOLN
INTRAVENOUS | Status: AC
  Administered 2017-03-26: 17:00:00 via INTRAVENOUS

## 2017-03-26 MED ORDER — ONDANSETRON HCL 4 MG/2ML IJ SOLN: 4.0000 mg | Freq: Four times a day (QID) | INTRAMUSCULAR | Status: DC | PRN

## 2017-03-26 MED ORDER — MEMANTINE HCL 10 MG PO TABS: 10.0000 mg | ORAL_TABLET | Freq: Two times a day (BID) | ORAL | Status: DC

## 2017-03-26 MED ORDER — ENSURE ENLIVE PO LIQD
237.0000 mL | Freq: Two times a day (BID) | ORAL | Status: DC
  Administered 2017-03-26 – 2017-03-28 (×4): 237 mL via ORAL
  Filled 2017-03-26: qty 237

## 2017-03-26 MED ORDER — TAMSULOSIN HCL 0.4 MG PO CAPS
0.4000 mg | ORAL_CAPSULE | Freq: Every day | ORAL | Status: DC
  Administered 2017-03-26 – 2017-03-27 (×2): 0.4 mg via ORAL
  Filled 2017-03-26 (×2): qty 1

## 2017-03-26 MED ORDER — FERROUS GLUCONATE 324 (38 FE) MG PO TABS
324.0000 mg | ORAL_TABLET | Freq: Every day | ORAL | Status: DC
  Administered 2017-03-27: 324 mg via ORAL
  Filled 2017-03-26 (×2): qty 1

## 2017-03-26 MED ORDER — MIRTAZAPINE 15 MG PO TABS: 15.0000 mg | ORAL_TABLET | Freq: Every day | ORAL | Status: DC

## 2017-03-26 MED ORDER — TIZANIDINE HCL 2 MG PO TABS
2.0000 mg | ORAL_TABLET | Freq: Every day | ORAL | Status: DC
  Administered 2017-03-26 – 2017-03-27 (×2): 2 mg via ORAL
  Filled 2017-03-26 (×2): qty 1

## 2017-03-26 MED ORDER — TEMAZEPAM 7.5 MG PO CAPS
7.5000 mg | ORAL_CAPSULE | Freq: Every day | ORAL | Status: DC
  Administered 2017-03-26 – 2017-03-27 (×2): 7.5 mg via ORAL
  Filled 2017-03-26 (×2): qty 1

## 2017-03-26 MED ORDER — DONEPEZIL HCL 5 MG PO TABS
5.0000 mg | ORAL_TABLET | Freq: Every day | ORAL | Status: DC
  Administered 2017-03-26 – 2017-03-27 (×2): 5 mg via ORAL
  Filled 2017-03-26 (×3): qty 1

## 2017-03-26 MED ORDER — ONDANSETRON HCL 4 MG PO TABS
4.0000 mg | ORAL_TABLET | Freq: Four times a day (QID) | ORAL | Status: DC | PRN
  Administered 2017-03-28: 4 mg via ORAL
  Filled 2017-03-26: qty 1

## 2017-03-26 MED ORDER — METOLAZONE 2.5 MG PO TABS
2.5000 mg | ORAL_TABLET | Freq: Once | ORAL | Status: DC
  Filled 2017-03-26: qty 1

## 2017-03-26 MED ORDER — GUAIFENESIN ER 600 MG PO TB12
600.0000 mg | ORAL_TABLET | Freq: Two times a day (BID) | ORAL | Status: DC
  Administered 2017-03-26 – 2017-03-28 (×4): 600 mg via ORAL
  Filled 2017-03-26 (×4): qty 1

## 2017-03-26 MED ORDER — MORPHINE SULFATE ER 15 MG PO TBCR
15.0000 mg | EXTENDED_RELEASE_TABLET | Freq: Two times a day (BID) | ORAL | Status: DC
  Administered 2017-03-26 – 2017-03-28 (×5): 15 mg via ORAL
  Filled 2017-03-26 (×5): qty 1

## 2017-03-26 MED ORDER — PANTOPRAZOLE SODIUM 40 MG PO TBEC
40.0000 mg | DELAYED_RELEASE_TABLET | Freq: Every day | ORAL | Status: DC
  Administered 2017-03-27 – 2017-03-28 (×2): 40 mg via ORAL
  Filled 2017-03-26 (×2): qty 1

## 2017-03-26 MED ORDER — LORAZEPAM 1 MG PO TABS: 0.5000 mg | ORAL_TABLET | Freq: Every day | ORAL | Status: DC

## 2017-03-26 MED ORDER — POTASSIUM CHLORIDE CRYS ER 20 MEQ PO TBCR
20.0000 meq | EXTENDED_RELEASE_TABLET | Freq: Every day | ORAL | Status: DC
  Administered 2017-03-27 – 2017-03-28 (×2): 20 meq via ORAL
  Filled 2017-03-26 (×2): qty 1

## 2017-03-26 MED ORDER — INFLUENZA VAC SPLIT HIGH-DOSE 0.5 ML IM SUSY
0.5000 mL | PREFILLED_SYRINGE | INTRAMUSCULAR | Status: AC
  Administered 2017-03-28: 0.5 mL via INTRAMUSCULAR
  Filled 2017-03-26 (×2): qty 0.5

## 2017-03-26 NOTE — Progress Notes (Signed)
Patient arrived to room 6N14 from ED.  Assessment complete, VS obtained, and Admission database began.

## 2017-03-26 NOTE — ED Provider Notes (Signed)
[facility] DEPT Provider Note   CSN: [id number] Arrival date & time: 03/26/17  1025     History   Chief Complaint Chief Complaint  Patient presents with  . Altered Mental Status    HPI [name] is a 83 y.o. male.  Patient with history of heart failure on Lasix, atrial flutter on Coumadin, dementia, GI bleed, DO NOT RESUSCITATE in place -- presents with generalized weakness and confusion over the past 3 days. History from a caregiver at bedside. Patient has had increased swelling, decreased activity and decreased appetite over the past 3 days. Patient is not ambulatory but will typically transfer to a chair with help and has not wanted to get up for the past 2 days. No reported fevers, chest pain, shortness of breath. Patient has been taking his daily medications including Lasix. Patient had one episode of vomiting yesterday that was brown in color. No reported diarrhea or urinary symptoms.  Level V caveat due to dementia, altered mental status.      Past Medical History:  Diagnosis Date  . Anemia   . Aortic regurgitation    a. Echo 12/2011: moderate AR.  Marland Kitchen Aortic stenosis    a. Echo 12/2011: moderate AS.  Marland Kitchen Arthritis    Right knee  . Atrial flutter ([place])    a. s/p DCCV 2007 (on propafenone remotely).  Marland Kitchen AVM (arteriovenous malformation) of colon    a. EGD 04/2014: two tiny nonbleeding AVMs, small nonbleeding duodenal ulcer, minimal antral erosions, minimal erosive esophagitis.  . Back fracture    s/p kyphoplasty  . Bacteremia 2013  . Blood transfusion   . Bowel obstruction ([place]) 02/2010   x 4 days  . Chronic pain   . Chronic systolic CHF (congestive heart failure) ([place])    a. Echo 12/2011: EF 20-25%, akinesis of mid-distal anteroseptal, anterior, and apical myocardium; consistent with infarction; moderate AS/AI, mod-severe MR, mod-severely dilated LA, mildly dilated RA, mod TR, PASP 15mmHg.  . Colon polyp   . Corneal disease   . Dementia   . Diverticulosis     . Duodenal ulcer    a. EGD 04/2014: two tiny nonbleeding AVMs, small nonbleeding duodenal ulcer, minimal antral erosions, minimal erosive esophagitis.  . Esophagitis    a. EGD 04/2014: two tiny nonbleeding AVMs, small nonbleeding duodenal ulcer, minimal antral erosions, minimal erosive esophagitis.  . Essential hypertension   . External hemorrhoid   . Failure to thrive (0-17)   . Gait abnormality   . Hyponatremia   . Hypothyroidism   . IBS (irritable bowel syndrome)   . Incontinence   . Internal hemorrhoid   . Lower GI bleeding 2011   a. 2005 note: slow gastrointestinal bleed, melena, and chronic anticoagulation, suspect peptic ulcer disease or erosive gastritis. b. GIB 2012.  . Macular degeneration   . Mitral regurgitation    a. Echo 12/2011: mod-severe MR.  . Orthostatic hypotension   . Pacemaker    a. s/p AV nodal ablation at [facility].  St Jude Pacemaker. Lead fracture in 2010 and required lead revision at [facility] at that time.  . Peripheral edema   . Peripheral neuropathy   . Permanent atrial fibrillation ([place])    a. Prior DCCV/rhythm control unsuccessful. b. s/p AVN ablation at [facility] and subsequent SJM Pacemaker.  . Pulmonary hypertension ([facility])    Moderate  . Restless leg syndrome   . Tricuspid regurgitation    a. Echo 12/2011: moderate TR.  . Varicose veins    Many  superficial veins along with peripheral edema    Patient Active Problem List   Diagnosis Date Noted  . Acute on chronic congestive heart failure ([place])   . Acute on chronic respiratory failure ([place]) 10/09/2016  . GERD (gastroesophageal reflux disease) 10/09/2016  . Hyponatremia 10/09/2016  . Severe protein-calorie malnutrition ([place]) 10/09/2016  . Depression with anxiety 10/09/2016  . Unspecified dementia without behavioral disturbance 10/09/2016  . Essential hypertension 07/18/2014  . Peripheral neuropathy   . Gait abnormality   . Diverticulosis   . Atrial flutter ([place])   . Tricuspid  regurgitation   . Mitral regurgitation   . Aortic regurgitation   . Aortic stenosis   . Esophagitis   . Duodenal ulcer   . AVM (arteriovenous malformation) of colon   . Macular degeneration   . Acute on chronic systolic CHF (congestive heart failure) ([place])   . Permanent atrial fibrillation ([place])   . Orthostatic hypotension   . Lower GI bleeding   . Anemia   . Restless leg syndrome   . Hypothyroidism   . Iron deficiency anemia 04/24/2014  . Heme positive stool 04/24/2014  . Chronic anticoagulation 11/08/2013  . Complete heart block ([place]) 09/28/2013  . Pulmonary edema 12/30/2011  . Staphylococcus aureus bacteremia 12/28/2011  . Pacemaker 12/28/2011  . Abdominal pain 12/26/2011  . Weakness generalized 12/25/2011  . UTI (lower urinary tract infection) 12/25/2011  . Spinal stenosis 07/29/2011  . Chronic pain 07/29/2011  . Pleural effusion on right 04/16/2010  . DEMENTIA 04/15/2010    Past Surgical History:  Procedure Laterality Date  . AV NODE ABLATION     at [place]     "as a kid"  . ESOPHAGOGASTRODUODENOSCOPY (EGD) WITH PROPOFOL N/A 04/24/2014   Procedure: ESOPHAGOGASTRODUODENOSCOPY (EGD) WITH PROPOFOL;  Surgeon: Lear Ng, MD;  Location: [facility];  Service: Endoscopy;  Laterality: N/A;  . EYE SURGERY     Corneal, at [facility], both layers removed.  Marland Kitchen HEMORRHOID SURGERY    . INGUINAL HERNIA REPAIR  1960's   left  . [place]   right  . KNEE ARTHROSCOPY  ~ 2000's   right  . PACEMAKER INSERTION  ~ 2006   initial placement  . PACEMAKER REVISION  05/2009   RV lead revised due to lead fracture.  performed by Dr Koleen Nimrod at [facility]  . TONSILLECTOMY         Home Medications    Prior to Admission medications   Medication Sig Start Date End Date Taking? Authorizing Provider  AZO-CRANBERRY PO Take 2 tablets by mouth daily.    Yes [provider]  diclofenac sodium (VOLTAREN) 1 % GEL Apply 1  application topically daily as needed (pain).   Yes [provider]  donepezil (ARICEPT) 5 MG tablet Take 1 tablet (5 mg total) by mouth at bedtime. 01/17/13  Yes Star Age, MD  ENSURE (ENSURE) Take 237 mLs by mouth daily.   Yes [provider]  ferrous gluconate (FERGON) 324 MG tablet Take 324 mg by mouth daily.  10/12/14  Yes [provider]  furosemide (LASIX) 40 MG tablet Take 2 tablets (80 mg total) by mouth daily. Patient taking differently: Take 40 mg by mouth 2 (two) times daily.  11/23/16 03/26/17 Yes Belva Crome, MD  guaiFENesin (MUCINEX) 600 MG 12 hr tablet Take 600 mg by mouth 2 (two) times daily.   Yes [provider]  Lactase (LACTAID PO) Take 1 tablet by mouth  2 (two) times daily.    Yes [provider]  levothyroxine (SYNTHROID, LEVOTHROID) 75 MCG tablet Take 75 mcg by mouth daily before breakfast.  01/03/13  Yes [provider]  LORazepam (ATIVAN) 0.5 MG tablet Take 0.5 mg by mouth daily.    Yes [provider]  Melatonin 10 MG TABS Take 20 mg by mouth at bedtime.   Yes [provider]  memantine (NAMENDA) 10 MG tablet Take 10 mg by mouth 2 (two) times daily.   Yes [provider]  mirtazapine (REMERON) 15 MG tablet Take 15 mg by mouth at bedtime.   Yes [provider]  morphine (MS CONTIN) 15 MG 12 hr tablet Take 15 mg by mouth every 12 (twelve) hours.  01/02/13  Yes [provider]  Multiple Vitamin (MULTIVITAMIN WITH MINERALS) TABS tablet Take 1 tablet by mouth daily.   Yes [provider]  omeprazole (PRILOSEC) 40 MG capsule Take 40 mg by mouth daily. 06/19/14  Yes [provider]  potassium chloride SA (K-DUR,KLOR-CON) 20 MEQ tablet Take 1 tablet (20 mEq total) by mouth daily. 10/27/16  Yes Belva Crome, MD  tamsulosin (FLOMAX) 0.4 MG CAPS capsule Take 0.4 mg by mouth at bedtime.  11/27/15  Yes [provider]  temazepam (RESTORIL) 7.5 MG capsule Take 7.5  mg by mouth at bedtime. 09/07/16  Yes [provider]  tiZANidine (ZANAFLEX) 2 MG tablet Take 2 mg by mouth at bedtime.  11/19/15  Yes [provider]  vitamin B-12 (CYANOCOBALAMIN) 1000 MCG tablet Take 1,000 mcg by mouth daily.   Yes [provider]  warfarin (COUMADIN) 5 MG tablet Take 2.5-5 mg by mouth See admin instructions. Pt takes 2.5mg  on Tues, Thurs, Sun - takes 5mg  Mon, Wed, Fri, Sat   Yes [provider]  metolazone (ZAROXOLYN) 2.5 MG tablet Take 1 tablet (2.5 mg total) by mouth once. Take 30 minutes prior to Furosemide. 02/11/17 02/11/17  Belva Crome, MD    Family History Family History  Problem Relation Age of Onset  . Adopted: Yes  . Factor V Leiden deficiency Daughter     Social History Social History  Substance Use Topics  . Smoking status: Former Smoker    Packs/day: 2.00    Years: 15.00    Types: Cigarettes    Quit date: 06/15/1971  . Smokeless tobacco: Never Used  . Alcohol use No     Comment: 07/29/11 "used to drink some; haven't now in quite a few years"     Allergies   Contrast media [iodinated diagnostic agents]; Penicillins; and Uroxatral [alfuzosin hydrochloride]   Review of Systems Review of Systems  Unable to perform ROS: Mental status change     Physical Exam Updated Vital Signs There were no vitals taken for this visit.  Physical Exam  Constitutional: He appears well-developed.  Thin, cachectic  HENT:  Head: Normocephalic and atraumatic.  Dry mucous membranes  Eyes: Conjunctivae are normal. Right eye exhibits no discharge. Left eye exhibits no discharge.  Neck: Normal range of motion. Neck supple.  Cardiovascular: Normal rate and regular rhythm.   Murmur (3/6, holosystolic murmur) heard. Pulmonary/Chest: Effort normal and breath sounds normal. No respiratory distress. He has no wheezes. He has no rales.  Abdominal: Soft. There is no tenderness. There is no rebound and no guarding.  Musculoskeletal: He  exhibits no edema or tenderness.  No appreciable lower extremity swelling.  Neurological: He is alert.  Limited neurological exam performed however patient does follow  commands has good bilateral grip strength,  no facial droop and can wiggle toes without difficulty. Knows name, states he feels weak.   Skin: Skin is warm and dry. No rash noted.  Psychiatric: He has a normal mood and affect.  Nursing note and vitals reviewed.    ED Treatments / Results  Labs (all labs ordered are listed, but only abnormal results are displayed) Labs Reviewed  COMPREHENSIVE METABOLIC PANEL - Abnormal; Notable for the following:       Result Value   Potassium 3.3 (*)    Chloride 100 (*)    BUN 38 (*)    Calcium 8.3 (*)    Albumin 2.9 (*)    ALT 12 (*)    All other components within normal limits  CBC WITH DIFFERENTIAL/PLATELET - Abnormal; Notable for the following:    RBC 3.59 (*)    Hemoglobin 11.1 (*)    HCT 35.2 (*)    RDW 17.0 (*)    All other components within normal limits  URINALYSIS, COMPLETE (UACMP) WITH MICROSCOPIC - Abnormal; Notable for the following:    Squamous Epithelial / LPF 0-5 (*)    All other components within normal limits  BRAIN NATRIURETIC PEPTIDE - Abnormal; Notable for the following:    B Natriuretic Peptide 896.8 (*)    All other components within normal limits  PROTIME-INR - Abnormal; Notable for the following:    Prothrombin Time 33.8 (*)    All other components within normal limits  I-STAT CG4 LACTIC ACID, ED  I-STAT TROPONIN, ED    EKG  EKG Interpretation None       Radiology Dg Chest Portable 1 View  Result Date: 03/26/2017 CLINICAL DATA:  Shortness of breath and cough. EXAM: PORTABLE CHEST 1 VIEW COMPARISON:  Chest x-ray dated October 11, 2016. FINDINGS: The patient is rotated to the right. Unchanged left chest wall AICD. Stable moderate cardiomegaly. Mild pulmonary vascular congestion with increased interstitial markings. The lungs are hyperinflated.  Unchanged hazy density throughout the majority of the right lung, likely a combination of pulmonary edema and layering pleural fluid. No pneumothorax. No acute osseous abnormality. IMPRESSION: 1. Stable moderate cardiomegaly with increased pulmonary vascular congestion and interstitial edema. 2. Largely unchanged hazy density throughout the majority of the right lung, likely a combination of pulmonary edema and layering pleural fluid. Electronically Signed   By: Titus Dubin M.D.   On: 03/26/2017 11:33    Procedures Procedures (including critical care time)  Medications Ordered in ED Medications  furosemide (LASIX) injection 40 mg (not administered)  feeding supplement (ENSURE ENLIVE) (ENSURE ENLIVE) liquid 237 mL (237 mLs Oral Given 03/26/17 1444)     Initial Impression / Assessment and Plan / ED Course  I have reviewed the triage vital signs and the nursing notes.  Pertinent labs & imaging results that were available during my care of the patient were reviewed by me and considered in my medical decision making (see chart for details).     Patient seen and examined. Work-up initiated.   Vital signs reviewed and are as follows: BP 93/61   Pulse 75   Temp (!) 96.6 F (35.9 C) (Rectal)   Resp 14   SpO2 97%   1:30 PM Patient discussed with and seen by Dr. Rex Kras.  Spoke with hospitalist who requests I get input from hospice. Patient's caregiver uncertain if patient is still under hospice care stating this was revoked this morning.    2:32 PM Spoke with hospice RN who  is aware of this patient. She is aware of family's concerns. Hospice was willing to provide in-home care including blood draws and MD consultation. Family was insistent that patient be sent to the hospital so they revoked hospice care and is no longer a hospice patient.   3:01 PM Spoke with Dyanne Carrel NP who will see.   Final Clinical Impressions(s) / ED Diagnoses   Final diagnoses:  Acute on chronic systolic  congestive heart failure ([place])   Admit.   New Prescriptions New Prescriptions   No medications on file     Carlisle Cater, Hershal Coria 03/26/17 [place], Wenda Overland, MD 03/27/17 1006

## 2017-03-26 NOTE — ED Triage Notes (Signed)
Pt from home  Via  EMS with [facility] . Pt is currently a Hospice PT. With CHF. Pt o2dependent . Skin in tact ,no break down on buttocks.

## 2017-03-26 NOTE — Progress Notes (Signed)
[name] for warfarin Indication: atrial fibrillation   Assessment: 89 yom with hx of afib on warfarin. Pharmacy consulted to dose inpatient. INR 3.36 on admit. Hg 11.1, plt wnl. No bleed documented.  PTA warfarin dose: 2.5mg  on TuThSu, 5mg  all other days (last dose 03/25/17 PTA)  Goal of Therapy:  INR 2-3 Monitor platelets by anticoagulation protocol: Yes   Plan:  Hold warfarin tonight with supratherapeutic INR Daily INR Monitor CBC, s/sx bleeding  Elicia Lamp, PharmD, BCPS Clinical Pharmacist 03/26/2017 9:36 PM

## 2017-03-26 NOTE — ED Notes (Signed)
Admitting at bedside 

## 2017-03-26 NOTE — H&P (Signed)
History and Physical    [name] OVF:[id number] DOB: 1922/06/29 DOA: 03/26/2017  PCP: Carol Ada, MD Patient coming from: home  Chief Complaint: generalized weakness  HPI: [name] is a 83 y.o. male with medical history significant for chronic heart failure on Lasix, atrial fib on Coumadin, dementia, GI bleed, hypertension, failure to thrive lytic back pain, like respiratory failure on 3 L nasal cannula presents to the emergency department from home with chief complaint of generalized weakness and decreased urine output.  He was recently under hospice care family discontinued  Information is obtained from the chart and the patient and  caregiver who is at the bedside. Patient is unsure why he was sent to the hospital. He states he is "a little uncomfortable and would rather be uncomfortable at home". Denies any worsening pain. The aid reports he has had a decreased oral intake over the last couple of days. Chart review indicates family has called cardiology with reports of decreased urine output. recomendations to increase diuretics and weigh. Blood work also requested which family refused. No reports of any recent nausea vomiting diarrhea. Patient denies any headache dizziness syncope or near-syncope. He denies worsening shortness of breath cough lower extremity edema. Caregiver reports patient is able to get up to chair with one man assistance he will bear weight.  Caregiver at bedside indicates patient lives at home with wife and that the electricity is out. She states he may be a little weaker than usual but in her opinion his cognition is at baseline.  Of note last hospitalization in May it was determined that patient would be comfort care only and evidently family has changed her mind regarding comfort care/hospice home care/goals of care. Patient is alert and oriented to self and place and is clear that he wishes to be home.   ED Course: Emergency department he is afebrile  with a very soft blood pressure  Review of Systems: As per HPI otherwise all other systems reviewed and are negative.   Ambulatory Status: mostly bedridden  Past Medical History:  Diagnosis Date  . Anemia   . Aortic regurgitation    a. Echo 12/2011: moderate AR.  Marland Kitchen Aortic stenosis    a. Echo 12/2011: moderate AS.  Marland Kitchen Arthritis    Right knee  . Atrial flutter ([place])    a. s/p DCCV 2007 (on propafenone remotely).  Marland Kitchen AVM (arteriovenous malformation) of colon    a. EGD 04/2014: two tiny nonbleeding AVMs, small nonbleeding duodenal ulcer, minimal antral erosions, minimal erosive esophagitis.  . Back fracture    s/p kyphoplasty  . Bacteremia 2013  . Blood transfusion   . Bowel obstruction ([place]) 02/2010   x 4 days  . Chronic pain   . Chronic systolic CHF (congestive heart failure) ([place])    a. Echo 12/2011: EF 20-25%, akinesis of mid-distal anteroseptal, anterior, and apical myocardium; consistent with infarction; moderate AS/AI, mod-severe MR, mod-severely dilated LA, mildly dilated RA, mod TR, PASP 56mmHg.  . Colon polyp   . Corneal disease   . Dementia   . Diverticulosis   . Duodenal ulcer    a. EGD 04/2014: two tiny nonbleeding AVMs, small nonbleeding duodenal ulcer, minimal antral erosions, minimal erosive esophagitis.  . Esophagitis    a. EGD 04/2014: two tiny nonbleeding AVMs, small nonbleeding duodenal ulcer, minimal antral erosions, minimal erosive esophagitis.  . Essential hypertension   . External hemorrhoid   . Failure to thrive (0-17)   . Gait abnormality   .  Hyponatremia   . Hypothyroidism   . IBS (irritable bowel syndrome)   . Incontinence   . Internal hemorrhoid   . Lower GI bleeding 2011   a. 2005 note: slow gastrointestinal bleed, melena, and chronic anticoagulation, suspect peptic ulcer disease or erosive gastritis. b. GIB 2012.  . Macular degeneration   . Mitral regurgitation    a. Echo 12/2011: mod-severe MR.  . Orthostatic hypotension   . Pacemaker    a. s/p  AV nodal ablation at [facility].  St Jude Pacemaker. Lead fracture in 2010 and required lead revision at [facility] at that time.  . Peripheral edema   . Peripheral neuropathy   . Permanent atrial fibrillation ([place])    a. Prior DCCV/rhythm control unsuccessful. b. s/p AVN ablation at [facility] and subsequent SJM Pacemaker.  . Pulmonary hypertension (HCC)    Moderate  . Restless leg syndrome   . Tricuspid regurgitation    a. Echo 12/2011: moderate TR.  . Varicose veins    Many superficial veins along with peripheral edema    Past Surgical History:  Procedure Laterality Date  . AV NODE ABLATION     at [place]     "as a kid"  . ESOPHAGOGASTRODUODENOSCOPY (EGD) WITH PROPOFOL N/A 04/24/2014   Procedure: ESOPHAGOGASTRODUODENOSCOPY (EGD) WITH PROPOFOL;  Surgeon: Lear Ng, MD;  Location: [place];  Service: Endoscopy;  Laterality: N/A;  . EYE SURGERY     Corneal, at [facility], both layers removed.  Marland Kitchen HEMORRHOID SURGERY    . INGUINAL HERNIA REPAIR  1960's   left  . [place]   right  . KNEE ARTHROSCOPY  ~ 2000's   right  . PACEMAKER INSERTION  ~ 2006   initial placement  . PACEMAKER REVISION  05/2009   RV lead revised due to lead fracture.  performed by Dr Koleen Nimrod at [facility]  . TONSILLECTOMY      Social History   Social History  . Marital status: Married    Spouse name: N/A  . Number of children: N/A  . Years of education: N/A   Occupational History  . Not on file.   Social History Main Topics  . Smoking status: Former Smoker    Packs/day: 2.00    Years: 15.00    Types: Cigarettes    Quit date: 06/15/1971  . Smokeless tobacco: Never Used  . Alcohol use No     Comment: 07/29/11 "used to drink some; haven't now in quite a few years"  . Drug use: No  . Sexual activity: Not Currently    Birth control/ protection: None   Other Topics Concern  . 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   Has several patents.  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    Allergies  Allergen Reactions  . Contrast Media [Iodinated Diagnostic Agents] Other (See Comments)    Unknown. Neither the patient or his wife are aware of any allergy to contrast dye.   Marland Kitchen Penicillins Hives    Has patient had a PCN reaction causing immediate rash, facial/tongue/throat swelling, SOB or lightheadedness with hypotension: Yes Has patient had a PCN reaction causing severe rash involving mucus membranes or skin necrosis: No Has patient had a PCN reaction that required hospitalization No Has patient had a PCN reaction occurring within the last 10 years: No If all of the above answers are "NO", then may  proceed with Cephalosporin use.  Marland Kitchen Uroxatral [Alfuzosin Hydrochloride] Other (See Comments)    hypotension    Family History  Problem Relation Age of Onset  . Adopted: Yes  . Factor V Leiden deficiency Daughter     Prior to Admission medications   Medication Sig Start Date End Date Taking? Authorizing Provider  AZO-CRANBERRY PO Take 2 tablets by mouth daily.    Yes [provider]  diclofenac sodium (VOLTAREN) 1 % GEL Apply 1 application topically daily as needed (pain).   Yes [provider]  donepezil (ARICEPT) 5 MG tablet Take 1 tablet (5 mg total) by mouth at bedtime. 01/17/13  Yes Star Age, MD  ENSURE (ENSURE) Take 237 mLs by mouth daily.   Yes [provider]  ferrous gluconate (FERGON) 324 MG tablet Take 324 mg by mouth daily.  10/12/14  Yes [provider]  furosemide (LASIX) 40 MG tablet Take 2 tablets (80 mg total) by mouth daily. Patient taking differently: Take 40 mg by mouth 2 (two) times daily.  11/23/16 03/26/17 Yes Belva Crome, MD  guaiFENesin (MUCINEX) 600 MG 12 hr tablet Take 600 mg by mouth 2 (two) times daily.   Yes [provider]  Lactase (LACTAID PO) Take 1 tablet by mouth 2 (two) times daily.     Yes [provider]  levothyroxine (SYNTHROID, LEVOTHROID) 75 MCG tablet Take 75 mcg by mouth daily before breakfast.  01/03/13  Yes [provider]  LORazepam (ATIVAN) 0.5 MG tablet Take 0.5 mg by mouth daily.    Yes [provider]  Melatonin 10 MG TABS Take 20 mg by mouth at bedtime.   Yes [provider]  memantine (NAMENDA) 10 MG tablet Take 10 mg by mouth 2 (two) times daily.   Yes [provider]  mirtazapine (REMERON) 15 MG tablet Take 15 mg by mouth at bedtime.   Yes [provider]  morphine (MS CONTIN) 15 MG 12 hr tablet Take 15 mg by mouth every 12 (twelve) hours.  01/02/13  Yes [provider]  Multiple Vitamin (MULTIVITAMIN WITH MINERALS) TABS tablet Take 1 tablet by mouth daily.   Yes [provider]  omeprazole (PRILOSEC) 40 MG capsule Take 40 mg by mouth daily. 06/19/14  Yes [provider]  potassium chloride SA (K-DUR,KLOR-CON) 20 MEQ tablet Take 1 tablet (20 mEq total) by mouth daily. 10/27/16  Yes Belva Crome, MD  tamsulosin (FLOMAX) 0.4 MG CAPS capsule Take 0.4 mg by mouth at bedtime.  11/27/15  Yes [provider]  temazepam (RESTORIL) 7.5 MG capsule Take 7.5 mg by mouth at bedtime. 09/07/16  Yes [provider]  tiZANidine (ZANAFLEX) 2 MG tablet Take 2 mg by mouth at bedtime.  11/19/15  Yes [provider]  vitamin B-12 (CYANOCOBALAMIN) 1000 MCG tablet Take 1,000 mcg by mouth daily.   Yes [provider]  warfarin (COUMADIN) 5 MG tablet Take 2.5-5 mg by mouth See admin instructions. Pt takes 2.5mg  on Tues, Thurs, Sun - takes 5mg  Mon, Wed, Fri, Sat   Yes [provider]  metolazone (ZAROXOLYN) 2.5 MG tablet Take 1 tablet (2.5 mg total) by mouth once. Take 30 minutes prior to Furosemide. 02/11/17 02/11/17  Belva Crome, MD    Physical Exam: Vitals:   03/26/17 1300 03/26/17 1315 03/26/17 1345 03/26/17 1430  BP: (!) 98/57 93/61 (!) 95/59 (!) 97/55  Pulse:  79 75 77 75  Resp: 19 14 19 20   Temp:  TempSrc:      SpO2: 96% 97% 98% 97%     General:  Appears calm, Lying on side in no acute distress. Eyes:  PERRL, EOMI, normal lids, iris ENT:  grossly normal hearing, lips & tongue, his membranes of his mouth are slightly pale somewhat dry Neck:  no LAD, masses or thyromegaly Cardiovascular:  RRR, no m/r/g. No LE edema.  Respiratory: Respirations somewhat shallow breath sounds distant hear no crackles no wheeze Abdomen:  soft, ntnd, no guarding or rebounding Skin:  no rash or induration seen on limited exam Musculoskeletal:  grossly normal tone BUE/BLE, good ROM, no bony abnormality Psychiatric:  grossly normal mood and affect, speech fluent and appropriate, AOx3 Neurologic:  Patient is alert and oriented to self and place. Able to follow commands and answer questions. Can make wants and needs known  Labs on Admission: I have personally reviewed following labs and imaging studies  CBC:  Recent Labs Lab 03/26/17 1131  WBC 5.2  NEUTROABS 3.2  HGB 11.1*  HCT 35.2*  MCV 98.1  PLT 409   Basic Metabolic Panel:  Recent Labs Lab 03/26/17 1131  NA 136  K 3.3*  CL 100*  CO2 29  GLUCOSE 93  BUN 38*  CREATININE 1.04  CALCIUM 8.3*   GFR: CrCl cannot be calculated (Unknown ideal weight.). Liver Function Tests:  Recent Labs Lab 03/26/17 1131  AST 19  ALT 12*  ALKPHOS 57  BILITOT 1.2  PROT 6.6  ALBUMIN 2.9*   No results for input(s): LIPASE, AMYLASE in the last 168 hours. No results for input(s): AMMONIA in the last 168 hours. Coagulation Profile:  Recent Labs Lab 03/26/17 1131  INR 3.36   Cardiac Enzymes: No results for input(s): CKTOTAL, CKMB, CKMBINDEX, TROPONINI in the last 168 hours. BNP (last 3 results)  Recent Labs  10/07/16 1056  PROBNP 12,173*   HbA1C: No results for input(s): HGBA1C in the last 72 hours. CBG: No results for input(s): GLUCAP in the last 168 hours. Lipid Profile: No results for  input(s): CHOL, HDL, LDLCALC, TRIG, CHOLHDL, LDLDIRECT in the last 72 hours. Thyroid Function Tests: No results for input(s): TSH, T4TOTAL, FREET4, T3FREE, THYROIDAB in the last 72 hours. Anemia Panel: No results for input(s): VITAMINB12, FOLATE, FERRITIN, TIBC, IRON, RETICCTPCT in the last 72 hours. Urine analysis:    Component Value Date/Time   COLORURINE YELLOW 03/26/2017 1154   APPEARANCEUR CLEAR 03/26/2017 1154   LABSPEC 1.010 03/26/2017 1154   PHURINE 7.0 03/26/2017 1154   GLUCOSEU NEGATIVE 03/26/2017 1154   HGBUR NEGATIVE 03/26/2017 [place] 03/26/2017 1154   [place] 03/26/2017 1154   PROTEINUR NEGATIVE 03/26/2017 1154   UROBILINOGEN 0.2 12/24/2011 1803   NITRITE NEGATIVE 03/26/2017 1154   LEUKOCYTESUR NEGATIVE 03/26/2017 1154    Creatinine Clearance: CrCl cannot be calculated (Unknown ideal weight.).  Sepsis Labs: @LABRCNTIP (procalcitonin:4,lacticidven:4) )No results found for this or any previous visit (from the past 240 hour(s)).   Radiological Exams on Admission: Dg Chest Portable 1 View  Result Date: 03/26/2017 CLINICAL DATA:  Shortness of breath and cough. EXAM: PORTABLE CHEST 1 VIEW COMPARISON:  Chest x-ray dated October 11, 2016. FINDINGS: The patient is rotated to the right. Unchanged left chest wall AICD. Stable moderate cardiomegaly. Mild pulmonary vascular congestion with increased interstitial markings. The lungs are hyperinflated. Unchanged hazy density throughout the majority of the right lung, likely a combination of pulmonary edema and layering pleural fluid. No pneumothorax. No acute osseous abnormality. IMPRESSION: 1. Stable moderate cardiomegaly  with increased pulmonary vascular congestion and interstitial edema. 2. Largely unchanged hazy density throughout the majority of the right lung, likely a combination of pulmonary edema and layering pleural fluid. Electronically Signed   By: Titus Dubin M.D.   On: 03/26/2017 11:33     EKG:   Assessment/Plan Principal Problem:   Decreased urine output Active Problems:   Generalized weakness   Pacemaker   Complete heart block (HCC)   Aortic regurgitation   Aortic stenosis   Anemia   Hypothyroidism   Essential hypertension   1. Decreased urine output. Likely related to decreased oral intake in the setting of diuretics. Review indicates outpatient evaluation remotely cardiology recommending increased diuretics and requesting lab work which family refused. Urinalysis unremarkable. No signs of infectious process. He is provided with 40 mg Lasix IV in the emergency department. BUN 38 creatinine 1.04.  -Admit to medicine -Obtain a bedside bladder scan -Gentle IV fluids -Monitor urine output -Hold diuretics for now  #2. Hypertension. Blood pressure is on the low end of normal. Home medications include Lasix, metolazone. -Hold zaroxolyn for now -Monitor -IV fluids as noted above  #3. Chronic systolic heart failure. Chest x-ray stable cardiomegaly with increased pulmonary vascular congestion. Unchanged pleural effusions. BNP 896. She received 40 mg of Lasix IV in the emergency department. -Daily weights -Intake and output -palliative care consult  #4. Hypokalemia. Mild. -Replete  #5. Hypothyroidism. -Continue Synthroid  #6. Chronic atrial fib. [place] Vasc 3. Chart review indicates blood pressure historically 20 for beta blocker. He is on Coumadin. Chart review indicates his last hospitalization Coumadin was discontinued but plan has changed so I will continue -coumadin per pharmacy -  DVT prophylaxis: coumadin  Code Status: dnr  Family Communication: no answer at wife's house  Disposition Plan: home  Consults called: palliative care  Admission status: obs    Radene Gunning MD [facility]  If 7PM-7AM, please contact night-coverage [email or web address] Password [email or web address]  03/26/2017, 3:54 PM

## 2017-03-27 DIAGNOSIS — I5023 Acute on chronic systolic (congestive) heart failure: Secondary | ICD-10-CM | POA: Diagnosis not present

## 2017-03-27 DIAGNOSIS — E039 Hypothyroidism, unspecified: Secondary | ICD-10-CM | POA: Diagnosis not present

## 2017-03-27 DIAGNOSIS — I10 Essential (primary) hypertension: Secondary | ICD-10-CM

## 2017-03-27 DIAGNOSIS — I959 Hypotension, unspecified: Secondary | ICD-10-CM | POA: Diagnosis not present

## 2017-03-27 DIAGNOSIS — R34 Anuria and oliguria: Secondary | ICD-10-CM | POA: Diagnosis not present

## 2017-03-27 DIAGNOSIS — Z515 Encounter for palliative care: Secondary | ICD-10-CM

## 2017-03-27 LAB — PROTIME-INR
INR: 3.16
PROTHROMBIN TIME: 32.2 s — AB (ref 11.4–15.2)

## 2017-03-27 MED ORDER — WARFARIN - PHARMACIST DOSING INPATIENT
Freq: Every day | Status: DC
  Administered 2017-03-27: 18:00:00

## 2017-03-27 MED ORDER — FUROSEMIDE 40 MG PO TABS: 40.0000 mg | ORAL_TABLET | Freq: Every day | ORAL | Status: DC

## 2017-03-27 MED ORDER — WARFARIN SODIUM 5 MG PO TABS
2.5000 mg | ORAL_TABLET | Freq: Once | ORAL | Status: AC
  Administered 2017-03-27: 2.5 mg via ORAL
  Filled 2017-03-27: qty 1

## 2017-03-27 NOTE — Progress Notes (Signed)
[place] for warfarin Indication: atrial fibrillation   Assessment: 89 yom on Coumadin 5mg  daily exc for 2.5mg  on TTSun for Afib. Last PTA dose was 10/12. INR elevated on admission at 3.36. INR down to 3.16 today. Hgb 11.1, plts wnl.  Goal of Therapy:  INR 2-3 Monitor platelets by anticoagulation protocol: Yes   Plan:  Give Coumadin 2.5mg  PO x 1 tonight Monitor daily INR, CBC, s/s of bleed  Elenor Quinones, PharmD, [facility] Clinical Pharmacist Pager [phone] 03/27/2017 9:46 AM

## 2017-03-27 NOTE — Progress Notes (Signed)
Clarified IVF order.

## 2017-03-27 NOTE — Discharge Instructions (Signed)

## 2017-03-27 NOTE — Progress Notes (Signed)
PROGRESS NOTE    [name]  QBH:[id number] DOB: 1923/06/01 DOA: 03/26/2017 PCP: Carol Ada, MD    Brief Narrative:  83 year old male who presented with generalized weakness and decreased urine output. Patient does have the significant past medical history of heart failure with systolic dysfunction, ejection fraction 25% on the left ventricle, atrial fibrillation, hypertension, dementia and history of gastrointestinal bleed. Patient reported decreased by mouth intake over last couple days prior to hospitalization, his diuretics were recently increased. Patient has been on hospice care at home until recently. On initial physical examination, blood pressure 98/57, heart rate 79, respiratory rate 19, oxygen saturation 96%. His lungs were clear to auscultation bilaterally, heart S1-S2 present and rhythmic, no gallops, rubs or murmurs, gallop was soft nontender, lower extremity edema. Neurologically patient was nonfocal. Sodium 136, potassium 3.3, chloride 100, bicarbonate 29, glucose 93, BUN 38, creatinine 1.04, white count 5.2, hemoglobin 11.1, hematocrit 35.2, platelets 187. INR 3.3, urinalysis negative for infection. Chest x-ray with right-sided rotation, bilateral pleural effusions, with signs of localization the right, increased interstitial markings bilaterally, no significantchange from prior films April 2018. Pacemaker in place with one ventricular lead. EKG with left axis deviation, left bundle branch block, rate of 70 bpm, with lateral T-wave inversions, apparently ventricular paced rhythm.   Patient was admitted to the hospital working diagnosis of generalized weakness and decreased urine output suspected to be related to overdiuresis.   Assessment & Plan:   Principal Problem:   Decreased urine output Active Problems:   Generalized weakness   Pacemaker   Complete heart block (HCC)   Aortic regurgitation   Aortic stenosis   Anemia   Hypothyroidism   Essential hypertension  Palliative care by specialist   1. Generalized weakness and decreased urine output. Patient has received IV furosemide on admission, will document strict in and out, condom catheter to quantify urine output. Noted hypotension with systolic down to 89 to 92. Will request physical therapy evaluation.   2. Systolic heart failure, chronic. Significant reduction on systolic LV function, clinically euvolemic, will continue to hold on furosemide for now and follow urine output. Target MAP 60. Will benefit from carvedilol and ace inh when blood pressure more stable.   3. Bilateral pleural effusions. Old films personally reviewed, apparently are chronic with loculation on the right. Will continue oxymetry monitoring and supplemental 02 per [place]. Will need to improve nutrition, decrease oncotic pressure may play a role on persistent effusions.   4. Hypotension. Close monitoring, will hold on antihypertensive medications and diuretics for now.   5. Hypothyroidism. Continue levothyroxine for now.   6. Atrial fibrillation. Not on any av blocking agents, on warfarin for anticoagulation, with inr at 3.16, will follow on pharmacy protocol for re-dosing. Considering patient's condition may consider to change warfarin for asa. ekg with paced rhythm.   7. Dementia. Continue donepezil and temazepam.     DVT prophylaxis: warfarin  Code Status: dnr Family Communication:  Disposition Plan:    Consultants:     Procedures:     Antimicrobials:       Subjective: Patient with generalized malaise, but no frank dyspnea or chest pain, no nausea or vomiting, no abdominal pain.   Objective: Vitals:   03/26/17 1730 03/26/17 2011 03/26/17 2300 03/27/17 0436  BP: (!) 87/62 (!) 92/47  (!) 91/53  Pulse: 72 76  76  Resp: 14 16  16   Temp:  97.6 F (36.4 C)  (!) 97.3 F (36.3 C)  TempSrc:  Oral  Oral  SpO2: 96% 97%  96%  Weight:   49.9 kg (110 lb)   Height:   5\' 4"  (1.626 m)     Intake/Output Summary  (Last 24 hours) at 03/27/17 1202 Last data filed at 03/27/17 0600  Gross per 24 hour  Intake            727.5 ml  Output                0 ml  Net            727.5 ml   Filed Weights   03/26/17 2300  Weight: 49.9 kg (110 lb)    Examination:   General: Not in pain or dyspnea, deconditioned Neurology: Awake and alert, non focal  E ENT: mild pallor, no icterus, oral mucosa moist Cardiovascular: No JVD. S1-S2 present, rhythmic, no gallops, rubs, positive systolic  murmur at the right sternal border, 3/6 with no radiations. Trace lower extremity edema.  Pulmonary: vesicular breath sounds bilaterally, adequate air movement, no wheezing, rhonchi or rales. Decreased breath sounds at bases, with poor inspiratory effort.  Gastrointestinal. Abdomen flat, no organomegaly, non tender, no rebound or guarding Skin. No rashes Musculoskeletal: no joint deformities     Data Reviewed: I have personally reviewed following labs and imaging studies  CBC:  Recent Labs Lab 03/26/17 1131  WBC 5.2  NEUTROABS 3.2  HGB 11.1*  HCT 35.2*  MCV 98.1  PLT 784   Basic Metabolic Panel:  Recent Labs Lab 03/26/17 1131  NA 136  K 3.3*  CL 100*  CO2 29  GLUCOSE 93  BUN 38*  CREATININE 1.04  CALCIUM 8.3*   GFR: Estimated Creatinine Clearance: 32 mL/min (by C-G formula based on SCr of 1.04 mg/dL). Liver Function Tests:  Recent Labs Lab 03/26/17 1131  AST 19  ALT 12*  ALKPHOS 57  BILITOT 1.2  PROT 6.6  ALBUMIN 2.9*   No results for input(s): LIPASE, AMYLASE in the last 168 hours. No results for input(s): AMMONIA in the last 168 hours. Coagulation Profile:  Recent Labs Lab 03/26/17 1131 03/27/17 0438  INR 3.36 3.16   Cardiac Enzymes: No results for input(s): CKTOTAL, CKMB, CKMBINDEX, TROPONINI in the last 168 hours. BNP (last 3 results)  Recent Labs  10/07/16 1056  PROBNP 12,173*   HbA1C: No results for input(s): HGBA1C in the last 72 hours. CBG: No results for input(s):  GLUCAP in the last 168 hours. Lipid Profile: No results for input(s): CHOL, HDL, LDLCALC, TRIG, CHOLHDL, LDLDIRECT in the last 72 hours. Thyroid Function Tests: No results for input(s): TSH, T4TOTAL, FREET4, T3FREE, THYROIDAB in the last 72 hours. Anemia Panel: No results for input(s): VITAMINB12, FOLATE, FERRITIN, TIBC, IRON, RETICCTPCT in the last 72 hours.    Radiology Studies: I have reviewed all of the imaging during this hospital visit personally     Scheduled Meds: . donepezil  5 mg Oral QHS  . feeding supplement (ENSURE ENLIVE)  237 mL Oral BID BM  . ferrous gluconate  324 mg Oral Daily  . guaiFENesin  600 mg Oral BID  . Influenza vac split quadrivalent PF  0.5 mL Intramuscular Tomorrow-1000  . levothyroxine  75 mcg Oral QAC breakfast  . mouth rinse  15 mL Mouth Rinse BID  . morphine  15 mg Oral Q12H  . pantoprazole  40 mg Oral Daily  . potassium chloride SA  20 mEq Oral Daily  . tamsulosin  0.4 mg Oral QHS  . temazepam  7.5 mg Oral  QHS  . tiZANidine  2 mg Oral QHS  . warfarin  2.5 mg Oral ONCE-1800  . Warfarin - Pharmacist Dosing Inpatient   Does not apply q1800   Continuous Infusions:   LOS: 0 days        Alvita Fana Gerome Apley, MD [facility] Pager [phone]

## 2017-03-27 NOTE — Consult Note (Signed)
Consultation Note Date: 03/27/2017   Patient Name: [name]  DOB: [birth year]  MRN: [id number]  Age / Sex: 83 y.o., male  PCP: Edward Ada, MD Referring Physician: Tawni Gill  Reason for Consultation: Establishing goals of care and Psychosocial/spiritual support  HPI/Patient Profile: 83 y.o. male  with past medical history of Vascular dementia, atrial fibrillation, history of GI bleed, hypertension, failure to thrive, chronic back pain, systolic heart failure with EF of 20% (2013), moderate aortic mitral as well as tricuspid regurgitation; moderate aortic stenosis, history of atrial flutter with pacemaker, colonic AVM admitted on 03/26/2017 with increased weakness, decreased urine output. Patient was seen by palliative medicine team provider [name], in May 2018. At that time patient and family had elected full comfort care and he was transferred to hospice in-home care.   Consult for goals of care.   Clinical Assessment and Goals of Care: No family at the bedside this morning. Patient is able to only participate in assessment to a limited degree, answering questions yes/no. He is repeatedly asking when he is going to go home. Per bedside RN, he ate 50% of his breakfast this morning as well as was able to take his medications. He does have a caregiver, [name], at the bedside who shares that he was under [facility] care until last night when the family requested to go to the hospital. I did phone [name] and speak with their representative on 03/27/2017. Representative did verify that [name] was a client of theirs and was told to revoke services since they do not have a contract with [place] and  family wanted him to come to the hospital. Family notably has been without power this week and has been concerned about running out of oxygen. Representative states that  family did have oxygen but is been quite worried about this and had also been asking staff about if [facility] offers residential hospice  Per [name] (home health aide), patient's healthcare power of attorney is his daughter, [name], [phone] Spouse: [name] [phone]  UA was negative for infection. Chest x-ray  revealed stable vascular congestion, BNP 896, albumin 2.9, lactic acid 0.75, WBC 5.2, hemoglobin 11.1, platelet 187    SUMMARY OF RECOMMENDATIONS   DNR Palliative medicine LM with family for [place] meeting and to review if goals of comfort have indeed changed If family is still interested in hospice, [facility] as well as [facility], do have contracts with [place] and would not require patient to revoke services prior to admission; additionally they both have associated inpatient facilities although at this point I am not clear whether he would meet that criteria of 2-4 weeks prognosis Code Status/Advance Care Planning:  DNR    Symptom Management:   Pain: Continue MS Contin 15 mg twice daily; Zanaflex 2 mg daily at bedtime. Will add Roxanol as needed for breakthrough pain or shortness of breath (patient was prescribed this when last seen by our service in  May 2018)  Palliative Prophylaxis:   Aspiration, Bowel Regimen, Delirium Protocol, Eye Care, Frequent Pain Assessment, Oral Care and Turn Reposition    Psycho-social/Spiritual:   Desire for further Chaplaincy support:no  Additional Recommendations: Referral to Community Resources   Prognosis:   < 6 months in the setting of end-stage systolic heart failure with an EF of 20%, vascular dementia, failure to thrive with an albumin of 2.9, bedbound status;  Discharge Planning: To Be Determined      Primary Diagnoses: Present on Admission: . Anemia . Aortic regurgitation . Complete heart block ([place]) . Essential hypertension . Hypothyroidism .  Pacemaker . Decreased urine output   I have reviewed the medical record, interviewed the patient and family, and examined the patient. The following aspects are pertinent.  Past Medical History:  Diagnosis Date  . Anemia   . Aortic regurgitation    a. Echo 12/2011: moderate AR.  Marland Kitchen Aortic stenosis    a. Echo 12/2011: moderate AS.  Marland Kitchen Arthritis    Right knee  . Atrial flutter ([place])    a. s/p DCCV 2007 (on propafenone remotely).  Marland Kitchen AVM (arteriovenous malformation) of colon    a. EGD 04/2014: two tiny nonbleeding AVMs, small nonbleeding duodenal ulcer, minimal antral erosions, minimal erosive esophagitis.  . Back fracture    s/p kyphoplasty  . Bacteremia 2013  . Blood transfusion   . Bowel obstruction ([place]) 02/2010   x 4 days  . Chronic pain   . Chronic systolic CHF (congestive heart failure) ([place])    a. Echo 12/2011: EF 20-25%, akinesis of mid-distal anteroseptal, anterior, and apical myocardium; consistent with infarction; moderate AS/AI, mod-severe MR, mod-severely dilated LA, mildly dilated RA, mod TR, PASP 57mmHg.  . Colon polyp   . Corneal disease   . Dementia   . Diverticulosis   . Duodenal ulcer    a. EGD 04/2014: two tiny nonbleeding AVMs, small nonbleeding duodenal ulcer, minimal antral erosions, minimal erosive esophagitis.  . Esophagitis    a. EGD 04/2014: two tiny nonbleeding AVMs, small nonbleeding duodenal ulcer, minimal antral erosions, minimal erosive esophagitis.  . Essential hypertension   . External hemorrhoid   . Failure to thrive (0-17)   . Gait abnormality   . Hyponatremia   . Hypothyroidism   . IBS (irritable bowel syndrome)   . Incontinence   . Internal hemorrhoid   . Lower GI bleeding 2011   a. 2005 note: slow gastrointestinal bleed, melena, and chronic anticoagulation, suspect peptic ulcer disease or erosive gastritis. b. GIB 2012.  . Macular degeneration   . Mitral regurgitation    a. Echo 12/2011: mod-severe MR.  . Orthostatic hypotension   .  Pacemaker    a. s/p AV nodal ablation at [facility].  St Jude Pacemaker. Lead fracture in 2010 and required lead revision at [facility] at that time.  . Peripheral edema   . Peripheral neuropathy   . Permanent atrial fibrillation ([place])    a. Prior DCCV/rhythm control unsuccessful. b. s/p AVN ablation at [facility] and subsequent SJM Pacemaker.  . Pulmonary hypertension ([facility])    Moderate  . Restless leg syndrome   . Tricuspid regurgitation    a. Echo 12/2011: moderate TR.  . Varicose veins    Many superficial veins along with peripheral edema   Social History   Social History  . Marital status: Married    Spouse name: N/A  . Number of children: N/A  . Years of education: N/A   Social History Main Topics  .  Smoking status: Former Smoker    Packs/day: 2.00    Years: 15.00    Types: Cigarettes    Quit date: 06/15/1971  . Smokeless tobacco: Never Used  . Alcohol use No     Comment: 07/29/11 "used to drink some; haven't now in quite a few years"  . Drug use: No  . Sexual activity: Not Currently    Birth control/ protection: None   Other Topics Concern  . 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   Has several patents.  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   Family History  Problem Relation Age of Onset  . Adopted: Yes  . Factor V Leiden deficiency Daughter    Scheduled Meds: . donepezil  5 mg Oral QHS  . feeding supplement (ENSURE ENLIVE)  237 mL Oral BID BM  . ferrous gluconate  324 mg Oral Daily  . guaiFENesin  600 mg Oral BID  . Influenza vac split quadrivalent PF  0.5 mL Intramuscular Tomorrow-1000  . levothyroxine  75 mcg Oral QAC breakfast  . mouth rinse  15 mL Mouth Rinse BID  . morphine  15 mg Oral Q12H  . pantoprazole  40 mg Oral Daily  . potassium chloride SA  20 mEq Oral Daily  . tamsulosin  0.4 mg Oral QHS  . temazepam  7.5 mg Oral QHS  . tiZANidine  2 mg Oral QHS  . warfarin  2.5 mg Oral ONCE-1800  .  Warfarin - Pharmacist Dosing Inpatient   Does not apply q1800   Continuous Infusions: PRN Meds:.ondansetron **OR** ondansetron (ZOFRAN) IV Medications Prior to Admission:  Prior to Admission medications   Medication Sig Start Date End Date Taking? Authorizing Provider  AZO-CRANBERRY PO Take 2 tablets by mouth daily.    Yes [provider]  diclofenac sodium (VOLTAREN) 1 % GEL Apply 1 application topically daily as needed (pain).   Yes [provider]  donepezil (ARICEPT) 5 MG tablet Take 1 tablet (5 mg total) by mouth at bedtime. 01/17/13  Yes Star Age, MD  ENSURE (ENSURE) Take 237 mLs by mouth daily.   Yes [provider]  ferrous gluconate (FERGON) 324 MG tablet Take 324 mg by mouth daily.  10/12/14  Yes [provider]  furosemide (LASIX) 40 MG tablet Take 2 tablets (80 mg total) by mouth daily. Patient taking differently: Take 40 mg by mouth 2 (two) times daily.  11/23/16 03/26/17 Yes Belva Crome, MD  guaiFENesin (MUCINEX) 600 MG 12 hr tablet Take 600 mg by mouth 2 (two) times daily.   Yes [provider]  Lactase (LACTAID PO) Take 1 tablet by mouth 2 (two) times daily.    Yes [provider]  levothyroxine (SYNTHROID, LEVOTHROID) 75 MCG tablet Take 75 mcg by mouth daily before breakfast.  01/03/13  Yes [provider]  LORazepam (ATIVAN) 0.5 MG tablet Take 0.5 mg by mouth daily.    Yes [provider]  Melatonin 10 MG TABS Take 20 mg by mouth at bedtime.   Yes [provider]  memantine (NAMENDA) 10 MG tablet Take 10 mg by mouth 2 (two) times daily.   Yes [provider]  mirtazapine (REMERON) 15 MG tablet Take 15 mg by mouth at bedtime.   Yes [provider]  morphine (MS CONTIN) 15 MG 12 hr tablet Take 15 mg by mouth every 12 (twelve) hours.  01/02/13  Yes [provider]  Multiple Vitamin (  MULTIVITAMIN WITH MINERALS) TABS tablet Take 1 tablet by mouth daily.   Yes [provider]  omeprazole (PRILOSEC) 40 MG capsule Take 40 mg by mouth daily. 06/19/14  Yes [provider]  potassium chloride SA (K-DUR,KLOR-CON) 20 MEQ tablet Take 1 tablet (20 mEq total) by mouth daily. 10/27/16  Yes Belva Crome, MD  tamsulosin (FLOMAX) 0.4 MG CAPS capsule Take 0.4 mg by mouth at bedtime.  11/27/15  Yes [provider]  temazepam (RESTORIL) 7.5 MG capsule Take 7.5 mg by mouth at bedtime. 09/07/16  Yes [provider]  tiZANidine (ZANAFLEX) 2 MG tablet Take 2 mg by mouth at bedtime.  11/19/15  Yes [provider]  vitamin B-12 (CYANOCOBALAMIN) 1000 MCG tablet Take 1,000 mcg by mouth daily.   Yes [provider]  warfarin (COUMADIN) 5 MG tablet Take 2.5-5 mg by mouth See admin instructions. Pt takes 2.5mg  on Tues, Thurs, Sun - takes 5mg  Mon, Wed, Fri, Sat   Yes [provider]  metolazone (ZAROXOLYN) 2.5 MG tablet Take 1 tablet (2.5 mg total) by mouth once. Take 30 minutes prior to Furosemide. 02/11/17 02/11/17  Belva Crome, MD   Allergies  Allergen Reactions  . Contrast Media [Iodinated Diagnostic Agents] Other (See Comments)    Unknown. Neither the patient or his wife are aware of any allergy to contrast dye.   Marland Kitchen Penicillins Hives    Has patient had a PCN reaction causing immediate rash, facial/tongue/throat swelling, SOB or lightheadedness with hypotension: Yes Has patient had a PCN reaction causing severe rash involving mucus membranes or skin necrosis: No Has patient had a PCN reaction that required hospitalization No Has patient had a PCN reaction occurring within the last 10 years: No If all of the above answers are "NO", then may proceed with Cephalosporin use.  Marland Kitchen Uroxatral [Alfuzosin Hydrochloride] Other (See Comments)    hypotension   Review of Systems  Unable to perform ROS: Mental status change    Physical Exam  Constitutional:  Acutely ill appearing elderly man; somnolent In no acute distress  HENT:   Temporal wasting  Cardiovascular: Normal rate.   Pulmonary/Chest: Effort normal.  Neurological:  Somnolent Answer brief, simple questions only  Skin: Skin is warm and dry. There is pallor.  Psychiatric:  Unable to test Overall patient is very weak, keeps asking when he is going to go home  Nursing note and vitals reviewed.   Vital Signs: BP (!) 91/53 (BP Location: Left Arm)   Pulse 76   Temp (!) 97.3 F (36.3 C) (Oral)   Resp 16   Ht 5\' 4"  (1.626 m)   Wt 49.9 kg (110 lb)   SpO2 96%   BMI 18.88 kg/m  Pain Assessment: 0-10   Pain Score: Asleep   SpO2: SpO2: 96 % O2 Device:SpO2: 96 % O2 Flow Rate: .O2 Flow Rate (L/min): 3 L/min  IO: Intake/output summary:  Intake/Output Summary (Last 24 hours) at 03/27/17 1020 Last data filed at 03/27/17 0600  Gross per 24 hour  Intake            727.5 ml  Output                0 ml  Net            727.5 ml    LBM: Last BM Date: 03/26/17 Baseline Weight: Weight: 49.9 kg (110 lb) Most recent weight: Weight: 49.9 kg (110 lb)     Palliative Assessment/Data:   Flowsheet  Rows     Most Recent Value  Intake Tab  Referral Department  Hospitalist  Unit at Time of Referral  Med/Surg Unit  Palliative Care Primary Diagnosis  Cardiac  Date Notified  03/27/17  Palliative Care Type  Return patient Palliative Care  Reason for referral  Clarify Goals of Care  Date of Admission  03/26/17  Date first seen by Palliative Care  03/27/17  # of days Palliative referral response time  0 Day(s)  # of days IP prior to Palliative referral  1  Clinical Assessment  Palliative Performance Scale Score  30%  Pain Max last 24 hours  Not able to report  Pain Min Last 24 hours  Not able to report  Dyspnea Max Last 24 Hours  Not able to report  Dyspnea Min Last 24 hours  Not able to report  Nausea Max Last 24 Hours  Not able to report  Nausea Min Last 24 Hours  Not able to report  Anxiety Max Last 24 Hours  Not able to report  Anxiety Min Last 24 Hours   Not able to report  Other Max Last 24 Hours  Not able to report  Psychosocial & Spiritual Assessment  Palliative Care Outcomes  Patient/Family meeting held?  No  Palliative Care follow-up planned  Yes, Facility      Time In: 0900 Time Out: 1000 Time Total: 60 min Greater than 50%  of this time was spent counseling and coordinating care related to the above assessment and plan.  Signed by: Dory Horn, NP   Please contact Palliative Medicine Team phone at [phone] for questions and concerns.  For individual provider: See Shea Evans

## 2017-03-27 NOTE — Care Management Obs Status (Signed)
[facility] NOTIFICATION   Patient Details  Name: [name] MRN: [id number] Date of Birth: Sep 30, 1922   Medicare Observation Status Notification Given:  Yes    CrutchfieldAntony Haste, RN 03/27/2017, 2:57 PM

## 2017-03-28 DIAGNOSIS — R531 Weakness: Secondary | ICD-10-CM | POA: Diagnosis not present

## 2017-03-28 DIAGNOSIS — Z515 Encounter for palliative care: Secondary | ICD-10-CM | POA: Diagnosis not present

## 2017-03-28 DIAGNOSIS — R34 Anuria and oliguria: Secondary | ICD-10-CM | POA: Diagnosis not present

## 2017-03-28 DIAGNOSIS — Z7189 Other specified counseling: Secondary | ICD-10-CM

## 2017-03-28 DIAGNOSIS — I5023 Acute on chronic systolic (congestive) heart failure: Secondary | ICD-10-CM | POA: Diagnosis not present

## 2017-03-28 DIAGNOSIS — I10 Essential (primary) hypertension: Secondary | ICD-10-CM | POA: Diagnosis not present

## 2017-03-28 LAB — BASIC METABOLIC PANEL
ANION GAP: 11 (ref 5–15)
BUN: 34 mg/dL — ABNORMAL HIGH (ref 6–20)
CHLORIDE: 105 mmol/L (ref 101–111)
CO2: 27 mmol/L (ref 22–32)
Calcium: 8.7 mg/dL — ABNORMAL LOW (ref 8.9–10.3)
Creatinine, Ser: 1.01 mg/dL (ref 0.61–1.24)
GFR calc non Af Amer: >60 mL/min (ref >60)
Glucose, Bld: 103 mg/dL — ABNORMAL HIGH (ref 65–99)
Potassium: 3.8 mmol/L (ref 3.5–5.1)
Sodium: 143 mmol/L (ref 135–145)

## 2017-03-28 LAB — PROTIME-INR
INR: 2.62
Prothrombin Time: 27.8 seconds — ABNORMAL HIGH (ref 11.4–15.2)

## 2017-03-28 MED ORDER — FUROSEMIDE 40 MG PO TABS: 40.0000 mg | ORAL_TABLET | Freq: Two times a day (BID) | ORAL | 0 refills | Status: AC

## 2017-03-28 NOTE — Telephone Encounter (Signed)
Pt currently admitted.  Labs drawn at hospital.  Will send to Dr. Tamala Julian to make him aware.

## 2017-03-28 NOTE — Progress Notes (Signed)
Daily Progress Note   Patient Name: [name]       Date: 03/28/2017 DOB: 09-05-1922  Age: 83 y.o. MRN#: [id number] Attending Physician: Tawni Millers Primary Care Physician: Carol Ada, MD Admit Date: 03/26/2017  Reason for Consultation/Follow-up: Establishing goals of care and Psychosocial/spiritual support  Subjective: [name] complains of nausea this morning. Caregiver "Flo" at bedside believes this is from eating oatmeal and foods that he is not used to be eating.  Length of Stay: 0  Current Medications: Scheduled Meds:  . donepezil  5 mg Oral QHS  . feeding supplement (ENSURE ENLIVE)  237 mL Oral BID BM  . ferrous gluconate  324 mg Oral Daily  . guaiFENesin  600 mg Oral BID  . levothyroxine  75 mcg Oral QAC breakfast  . mouth rinse  15 mL Mouth Rinse BID  . morphine  15 mg Oral Q12H  . pantoprazole  40 mg Oral Daily  . potassium chloride SA  20 mEq Oral Daily  . tamsulosin  0.4 mg Oral QHS  . temazepam  7.5 mg Oral QHS  . tiZANidine  2 mg Oral QHS  . Warfarin - Pharmacist Dosing Inpatient   Does not apply q1800    Continuous Infusions:   PRN Meds: ondansetron **OR** ondansetron (ZOFRAN) IV  Physical Exam  Constitutional: He appears well-developed.  HENT:  Head: Normocephalic and atraumatic.  Cardiovascular: Normal rate.  An irregularly irregular rhythm present.  Pulmonary/Chest: Effort normal. No accessory muscle usage. No tachypnea. No respiratory distress.  Abdominal: Soft. Normal appearance.  Neurological: He is alert. He is disoriented.  Nursing note and vitals reviewed.           Vital Signs: BP (!) 95/50 (BP Location: Left Arm)   Pulse 81   Temp (!) 97.4 F (36.3 C) (Oral)   Resp 16   Ht 5\' 4"  (1.626 m)   Wt 49.9 kg (110 lb)    SpO2 99%   BMI 18.88 kg/m  SpO2: SpO2: 99 % O2 Device: O2 Device: Not Delivered O2 Flow Rate: O2 Flow Rate (L/min): 3 L/min  Intake/output summary:  Intake/Output Summary (Last 24 hours) at 03/28/17 1158 Last data filed at 03/28/17 1010  Gross per 24 hour  Intake  300 ml  Output                0 ml  Net              300 ml   LBM: Last BM Date: 03/26/17 Baseline Weight: Weight: 49.9 kg (110 lb) Most recent weight: Weight: 49.9 kg (110 lb)       Palliative Assessment/Data: 30%   Flowsheet Rows     Most Recent Value  Intake Tab  Referral Department  Hospitalist  Unit at Time of Referral  Med/Surg Unit  Palliative Care Primary Diagnosis  Cardiac  Date Notified  03/27/17  Palliative Care Type  Return patient Palliative Care  Reason for referral  Clarify Goals of Care  Date of Admission  03/26/17  Date first seen by Palliative Care  03/27/17  # of days Palliative referral response time  0 Day(s)  # of days IP prior to Palliative referral  1  Clinical Assessment  Palliative Performance Scale Score  30%  Pain Max last 24 hours  Not able to report  Pain Min Last 24 hours  Not able to report  Dyspnea Max Last 24 Hours  Not able to report  Dyspnea Min Last 24 hours  Not able to report  Nausea Max Last 24 Hours  Not able to report  Nausea Min Last 24 Hours  Not able to report  Anxiety Max Last 24 Hours  Not able to report  Anxiety Min Last 24 Hours  Not able to report  Other Max Last 24 Hours  Not able to report  Psychosocial & Spiritual Assessment  Palliative Care Outcomes  Patient/Family meeting held?  No  Palliative Care follow-up planned  Yes, Facility      Patient Active Problem List   Diagnosis Date Noted  . Palliative care by specialist   . Acute encephalopathy 03/26/2017  . Decreased urine output 03/26/2017  . Acute on chronic congestive heart failure ([place])   . Acute on chronic respiratory failure ([place]) 10/09/2016  . GERD (gastroesophageal reflux  disease) 10/09/2016  . Hyponatremia 10/09/2016  . Severe protein-calorie malnutrition ([place]) 10/09/2016  . Depression with anxiety 10/09/2016  . Unspecified dementia without behavioral disturbance 10/09/2016  . Essential hypertension 07/18/2014  . Peripheral neuropathy   . Gait abnormality   . Diverticulosis   . Atrial flutter ([place])   . Tricuspid regurgitation   . Mitral regurgitation   . Aortic regurgitation   . Aortic stenosis   . Esophagitis   . Duodenal ulcer   . AVM (arteriovenous malformation) of colon   . Macular degeneration   . Chronic systolic heart failure ([place])   . Permanent atrial fibrillation ([place])   . Orthostatic hypotension   . Lower GI bleeding   . Anemia   . Restless leg syndrome   . Hypothyroidism   . Iron deficiency anemia 04/24/2014  . Heme positive stool 04/24/2014  . Chronic anticoagulation 11/08/2013  . Complete heart block ([place]) 09/28/2013  . Pulmonary edema 12/30/2011  . Staphylococcus aureus bacteremia 12/28/2011  . Pacemaker 12/28/2011  . Abdominal pain 12/26/2011  . Generalized weakness 12/25/2011  . UTI (lower urinary tract infection) 12/25/2011  . Spinal stenosis 07/29/2011  . Chronic pain 07/29/2011  . Pleural effusion on right 04/16/2010  . DEMENTIA 04/15/2010    Palliative Care Assessment & Plan   HPI: 83 y.o. male  with past medical history of Vascular dementia, atrial fibrillation, history of GI bleed, hypertension, failure to thrive, chronic back pain,  systolic heart failure with EF of 20% (2013), moderate aortic mitral as well as tricuspid regurgitation; moderate aortic stenosis, history of atrial flutter with pacemaker, colonic AVM admitted on 03/26/2017 with increased weakness, decreased urine output. Patient was seen by palliative medicine team provider Dr. Rhea Pink, in May 2018. At that time patient and family had elected full comfort care and he was transferred to hospice in-home care.   Consult for goals of care.     Assessment: Caregiver at bedside has been with [name] for 2 years now. She talks of how he has had hospice multiple times but feels this time is different and he continues to decline. She says plan is to return home and continue with hospice care - he is ready to go home.   I spoke with his daughter, [name], who confirms plan to return home and continue with [facility]. They brought him into the hospital given his symptoms and the fact they were limited on providing him comfort as they lost power. I also spoke with his wife and shared with them both that the plan seems to be discharge back home today and that the case manager will be in touch with them to arrange.   Recommendations/Plan:  Pain: Continue MS Contin 15 mg twice daily; Zanaflex 2 mg daily at bedtime. Will add Roxanol as needed for breakthrough pain or shortness of breath (patient was prescribed this when last seen by our service in May 2018)  Nausea: Zofran prn.   Goals of Care and Additional Recommendations:  Limitations on Scope of Treatment: Full Comfort Care  Code Status:  DNR  Prognosis:   < 6 months  Discharge Planning:  Home with Hospice   Thank you for allowing the Palliative Medicine Team to assist in the care of this patient.   Total Time 25 min Prolonged Time Billed  no       Greater than 50%  of this time was spent counseling and coordinating care related to the above assessment and plan.  Vinie Sill, NP Palliative Medicine Team Pager # [phone] (M-F 8a-5p) Team Phone # [phone] (Nights/Weekends)

## 2017-03-28 NOTE — Progress Notes (Signed)
o2 on RA 79%. On 2lNC, o2 sat 93%.

## 2017-03-28 NOTE — Progress Notes (Signed)
Caretaker removed external catheter due to the device leaking and putting the patient's skin at risk.  Caretaker reports patient saturated 2 briefs with 2 pad inserts in each overnight.

## 2017-03-28 NOTE — Discharge Summary (Signed)
Physician Discharge Summary  [name]:[id number] DOB: [birth year] DOA: 03/26/2017  PCP: Carol Ada, MD  Admit date: 03/26/2017 Discharge date: 03/28/2017  Admitted From: Home Disposition:  Home  Recommendations for Outpatient Follow-up:  1. Follow up with PCP in 1.  2. Resume furosemide 40 mg bid, further titration per hospice services 3. Will hold on metolazone for now.  Home Health: Home hospice  Equipment/Devices: na  Discharge Condition: stable CODE STATUS: dnr  Diet recommendation: Heart healthy  Brief/Interim Summary: 83 year old male who presented with generalized weakness and decreased urine output. Patient does have the significant past medical history of heart failure with systolic dysfunction, ejection fraction 25% on the left ventricle, atrial fibrillation, hypertension, dementia and history of gastrointestinal bleed. Patient reported decreased by mouth intake over last couple days prior to hospitalization, his diuretics were recently increased. Patient has been on hospice care at home until recently. On initial physical examination, blood pressure 98/57, heart rate 79, respiratory rate 19, oxygen saturation 96%. His lungs were clear to auscultation bilaterally, heart S1-S2 present and rhythmic, no gallops, rubs or murmurs, gallop was soft nontender, lower extremity edema. Neurologically patient was nonfocal. Sodium 136, potassium 3.3, chloride 100, bicarbonate 29, glucose 93, BUN 38, creatinine 1.04, white count 5.2, hemoglobin 11.1, hematocrit 35.2, platelets 187. INR 3.3, urinalysis negative for infection. Chest x-ray with right-sided rotation, bilateral pleural effusions, with signs of localition the right, increased interstitial markings bilaterally, no significant change from prior films April 2018. Pacemaker in place with one ventricular lead. EKG with left axis deviation, left bundle branch block, rate of 70 bpm, with lateral T-wave inversions, apparently  ventricular paced rhythm.   Per patient's wife report patient significant generalized edema.   Patient was admitted to the hospital working diagnosis of generalized weakness and decreased urine output suspected to heart failure decompensation acute on chronic systolic.  1. Systolic heart failure decompensation, acute on chronic. Patient received IV Lasix on admission with significant improvement of his edema, blood pressure systolic 87 to 99, unable to use afterload reducing agents, or beta blockade. Will continue conservative medical therapy, symptomatic control with furosemide, recommend continue 40 mg twice daily with further titration up to hospice services, for now hold on metolazone.   2. Bilateral pleural effusions. Chronic and stable, positive localization on the right. Continue conservative supportive care. Oxygen saturation 99% on room air, no frank dyspnea. Keep negative fluid balance. Effusions likely transudate, due to heart failure, combined with hypoproteinemia, calorie protein malnutrition.   3. Atrial fibrillation. Chronic, no signs of decompensation, patient not on any AV blocking agents or other rate/rhythm controlling agents. Will continue anticoagulation with warfarin, discharge INR 2.6. Target INR 2-3.   4. Hypothyroidism. Continue levothyroxine.  5. Dementia. No agitation, continue namenda, donepezil, mirtazapine, temazepam and melatonin. Continue hospice care with morphine and lorazepam  Discharge Diagnoses:  Principal Problem:   Decreased urine output Active Problems:   Generalized weakness   Pacemaker   Complete heart block (HCC)   Aortic regurgitation   Aortic stenosis   Anemia   Hypothyroidism   Essential hypertension   Palliative care by specialist    Discharge Instructions   Allergies as of 03/28/2017      Reactions   Contrast Media [iodinated Diagnostic Agents] Other (See Comments)   Unknown. Neither the patient or his wife are aware of any  allergy to contrast dye.    Penicillins Hives   Has patient had a PCN reaction causing immediate rash, facial/tongue/throat swelling, SOB or lightheadedness  with hypotension: Yes Has patient had a PCN reaction causing severe rash involving mucus membranes or skin necrosis: No Has patient had a PCN reaction that required hospitalization No Has patient had a PCN reaction occurring within the last 10 years: No If all of the above answers are "NO", then may proceed with Cephalosporin use.   Uroxatral [alfuzosin Hydrochloride] Other (See Comments)   hypotension      Medication List    STOP taking these medications   metolazone 2.5 MG tablet Commonly known as:  ZAROXOLYN     TAKE these medications   AZO-CRANBERRY PO Take 2 tablets by mouth daily.   diclofenac sodium 1 % Gel Commonly known as:  VOLTAREN Apply 1 application topically daily as needed (pain).   donepezil 5 MG tablet Commonly known as:  ARICEPT Take 1 tablet (5 mg total) by mouth at bedtime.   ENSURE Take 237 mLs by mouth daily.   ferrous gluconate 324 MG tablet Commonly known as:  FERGON Take 324 mg by mouth daily.   furosemide 40 MG tablet Commonly known as:  LASIX Take 1 tablet (40 mg total) by mouth 2 (two) times daily.   guaiFENesin 600 MG 12 hr tablet Commonly known as:  MUCINEX Take 600 mg by mouth 2 (two) times daily.   LACTAID PO Take 1 tablet by mouth 2 (two) times daily.   levothyroxine 75 MCG tablet Commonly known as:  SYNTHROID, LEVOTHROID Take 75 mcg by mouth daily before breakfast.   LORazepam 0.5 MG tablet Commonly known as:  ATIVAN Take 0.5 mg by mouth daily.   Melatonin 10 MG Tabs Take 20 mg by mouth at bedtime.   memantine 10 MG tablet Commonly known as:  NAMENDA Take 10 mg by mouth 2 (two) times daily.   mirtazapine 15 MG tablet Commonly known as:  REMERON Take 15 mg by mouth at bedtime.   morphine 15 MG 12 hr tablet Commonly known as:  MS CONTIN Take 15 mg by mouth every  12 (twelve) hours.   multivitamin with minerals Tabs tablet Take 1 tablet by mouth daily.   omeprazole 40 MG capsule Commonly known as:  PRILOSEC Take 40 mg by mouth daily.   potassium chloride SA 20 MEQ tablet Commonly known as:  K-DUR,KLOR-CON Take 1 tablet (20 mEq total) by mouth daily.   tamsulosin 0.4 MG Caps capsule Commonly known as:  FLOMAX Take 0.4 mg by mouth at bedtime.   temazepam 7.5 MG capsule Commonly known as:  RESTORIL Take 7.5 mg by mouth at bedtime.   tiZANidine 2 MG tablet Commonly known as:  ZANAFLEX Take 2 mg by mouth at bedtime.   vitamin B-12 1000 MCG tablet Commonly known as:  CYANOCOBALAMIN Take 1,000 mcg by mouth daily.   warfarin 5 MG tablet Commonly known as:  COUMADIN Take 2.5-5 mg by mouth See admin instructions. Pt takes 2.5mg  on Tues, Thurs, Sun - takes 5mg  Mon, Wed, Fri, Sat       Allergies  Allergen Reactions  . Contrast Media [Iodinated Diagnostic Agents] Other (See Comments)    Unknown. Neither the patient or his wife are aware of any allergy to contrast dye.   Marland Kitchen Penicillins Hives    Has patient had a PCN reaction causing immediate rash, facial/tongue/throat swelling, SOB or lightheadedness with hypotension: Yes Has patient had a PCN reaction causing severe rash involving mucus membranes or skin necrosis: No Has patient had a PCN reaction that required hospitalization No Has patient had a PCN reaction  occurring within the last 10 years: No If all of the above answers are "NO", then may proceed with Cephalosporin use.  Marland Kitchen Uroxatral [Alfuzosin Hydrochloride] Other (See Comments)    hypotension    Consultations:  Palliative care   Procedures/Studies: Dg Chest Portable 1 View  Result Date: 03/26/2017 CLINICAL DATA:  Shortness of breath and cough. EXAM: PORTABLE CHEST 1 VIEW COMPARISON:  Chest x-ray dated October 11, 2016. FINDINGS: The patient is rotated to the right. Unchanged left chest wall AICD. Stable moderate cardiomegaly.  Mild pulmonary vascular congestion with increased interstitial markings. The lungs are hyperinflated. Unchanged hazy density throughout the majority of the right lung, likely a combination of pulmonary edema and layering pleural fluid. No pneumothorax. No acute osseous abnormality. IMPRESSION: 1. Stable moderate cardiomegaly with increased pulmonary vascular congestion and interstitial edema. 2. Largely unchanged hazy density throughout the majority of the right lung, likely a combination of pulmonary edema and layering pleural fluid. Electronically Signed   By: Titus Dubin M.D.   On: 03/26/2017 11:33       Subjective: Patient tolerating po well, no dyspnea or chest pain, no nausea or vomiting.   Discharge Exam: Vitals:   03/27/17 2113 03/28/17 0517  BP: (!) 99/58 (!) 95/50  Pulse: 76 81  Resp: 16 16  Temp: 98.6 F (37 C) (!) 97.4 F (36.3 C)  SpO2: 100% 99%   Vitals:   03/27/17 0436 03/27/17 1210 03/27/17 2113 03/28/17 0517  BP: (!) 91/53 (!) 92/47 (!) 99/58 (!) 95/50  Pulse: 76 76 76 81  Resp: 16 16 16 16   Temp: (!) 97.3 F (36.3 C) 98 F (36.7 C) 98.6 F (37 C) (!) 97.4 F (36.3 C)  TempSrc: Oral Oral Oral Oral  SpO2: 96% 98% 100% 99%  Weight:      Height:        General: Pt is alert, awake, not in acute distress E ENT. Mild pallor, no icterus, oral mucosa moist.  Cardiovascular: RRR, S1/S2 +, no rubs, no gallops, decreased breath sounds at bases.  Respiratory: CTA bilaterally, no wheezing, no rhonchi Abdominal: Soft, NT, ND, bowel sounds + Extremities: no edema, no cyanosis    The results of significant diagnostics from this hospitalization (including imaging, microbiology, ancillary and laboratory) are listed below for reference.     Microbiology: No results found for this or any previous visit (from the past 240 hour(s)).   Labs: BNP (last 3 results)  Recent Labs  10/09/16 1736 03/26/17 1131  BNP 1,165.0* 852.7*   Basic Metabolic Panel:  Recent  Labs Lab 03/26/17 1131 03/28/17 0450  NA 136 143  K 3.3* 3.8  CL 100* 105  CO2 29 27  GLUCOSE 93 103*  BUN 38* 34*  CREATININE 1.04 1.01  CALCIUM 8.3* 8.7*   Liver Function Tests:  Recent Labs Lab 03/26/17 1131  AST 19  ALT 12*  ALKPHOS 57  BILITOT 1.2  PROT 6.6  ALBUMIN 2.9*   No results for input(s): LIPASE, AMYLASE in the last 168 hours. No results for input(s): AMMONIA in the last 168 hours. CBC:  Recent Labs Lab 03/26/17 1131  WBC 5.2  NEUTROABS 3.2  HGB 11.1*  HCT 35.2*  MCV 98.1  PLT 187   Cardiac Enzymes: No results for input(s): CKTOTAL, CKMB, CKMBINDEX, TROPONINI in the last 168 hours. BNP: Invalid input(s): POCBNP CBG: No results for input(s): GLUCAP in the last 168 hours. D-Dimer No results for input(s): DDIMER in the last 72 hours. Hgb A1c No  results for input(s): HGBA1C in the last 72 hours. Lipid Profile No results for input(s): CHOL, HDL, LDLCALC, TRIG, CHOLHDL, LDLDIRECT in the last 72 hours. Thyroid function studies No results for input(s): TSH, T4TOTAL, T3FREE, THYROIDAB in the last 72 hours.  Invalid input(s): FREET3 Anemia work up No results for input(s): VITAMINB12, FOLATE, FERRITIN, TIBC, IRON, RETICCTPCT in the last 72 hours. Urinalysis    Component Value Date/Time   COLORURINE YELLOW 03/26/2017 1154   APPEARANCEUR CLEAR 03/26/2017 1154   LABSPEC 1.010 03/26/2017 1154   PHURINE 7.0 03/26/2017 1154   GLUCOSEU NEGATIVE 03/26/2017 1154   HGBUR NEGATIVE 03/26/2017 [place] 03/26/2017 1154   KETONESUR NEGATIVE 03/26/2017 1154   PROTEINUR NEGATIVE 03/26/2017 1154   UROBILINOGEN 0.2 12/24/2011 1803   NITRITE NEGATIVE 03/26/2017 1154   LEUKOCYTESUR NEGATIVE 03/26/2017 1154   Sepsis Labs Invalid input(s): PROCALCITONIN,  WBC,  LACTICIDVEN Microbiology No results found for this or any previous visit (from the past 240 hour(s)).   Time coordinating discharge: 45 minutes  SIGNED:   Tawni Millers, MD  [facility] 03/28/2017, 1:35 PM Pager [phone]  If 7PM-7AM, please contact night-coverage [email or web address] Password [email or web address]

## 2017-03-28 NOTE — Discharge Planning (Signed)
Patient discharged home in stable condition via ambulance. Caregiver, Letta Median, verbalizes understanding of all discharge instructions, including home medications and follow up appointments.

## 2017-03-28 NOTE — Care Management Note (Signed)
Case Management Note  Patient Details  Name: [name] MRN: [id number] Date of Birth: August 23, 1922  Subjective/Objective:                    Action/Plan:  Spoke to patient's wife Enid Derry via phone [phone]. Ms Callicott aware husband being discharged today and wants to continue with [facility] . Bambi [phone] [id number] aware of discharge today.   Ms Berthold states her husband has all required DME at home including oxygen. Requesting ambulance transportation home.   [facility] paperwork and phone number in shadow chart. Bedside nurse will call [facility] once patient ready to be discharge. Patient's wife and caregiver at bedside aware. Gold DNR form also completed and in shadow chart  Expected Discharge Date:                  Expected Discharge Plan:  Home w Hospice Care  In-House Referral:     Discharge planning Services  CM Consult  Post Acute Care Choice:  Hospice Choice offered to:  Spouse  DME Arranged:    DME Agency:     HH Arranged:    [place]  Status of Service:  Completed, signed off  If discussed at [place] of Stay Meetings, dates discussed:    Additional Comments:  Marilu Favre, RN 03/28/2017, 1:26 PM

## 2017-03-28 NOTE — Progress Notes (Signed)
PT Cancellation Note  Patient Details Name: [name] MRN: [id number] DOB: 07-17-1922   Cancelled Treatment:    Reason Eval/Treat Not Completed: Medical issues which prohibited therapy. Per RN, pt in pain and tachycardic. Will check back tomorrow but also questioning whether he will be appropriate for PT. Will watch for follow up from palliative.   [name] 03/28/2017, 1:12 PM

## 2017-03-31 DIAGNOSIS — G629 Polyneuropathy, unspecified: Secondary | ICD-10-CM | POA: Diagnosis not present

## 2017-03-31 DIAGNOSIS — R0602 Shortness of breath: Secondary | ICD-10-CM | POA: Diagnosis not present

## 2017-03-31 DIAGNOSIS — E871 Hypo-osmolality and hyponatremia: Secondary | ICD-10-CM | POA: Diagnosis not present

## 2017-03-31 DIAGNOSIS — Z950 Presence of cardiac pacemaker: Secondary | ICD-10-CM | POA: Diagnosis not present

## 2017-03-31 DIAGNOSIS — J9621 Acute and chronic respiratory failure with hypoxia: Secondary | ICD-10-CM | POA: Diagnosis not present

## 2017-03-31 DIAGNOSIS — I4892 Unspecified atrial flutter: Secondary | ICD-10-CM | POA: Diagnosis not present

## 2017-03-31 DIAGNOSIS — I10 Essential (primary) hypertension: Secondary | ICD-10-CM | POA: Diagnosis not present

## 2017-03-31 DIAGNOSIS — J811 Chronic pulmonary edema: Secondary | ICD-10-CM | POA: Diagnosis not present

## 2017-03-31 DIAGNOSIS — I340 Nonrheumatic mitral (valve) insufficiency: Secondary | ICD-10-CM | POA: Diagnosis not present

## 2017-03-31 DIAGNOSIS — K209 Esophagitis, unspecified: Secondary | ICD-10-CM | POA: Diagnosis not present

## 2017-03-31 DIAGNOSIS — I442 Atrioventricular block, complete: Secondary | ICD-10-CM | POA: Diagnosis not present

## 2017-03-31 DIAGNOSIS — I5023 Acute on chronic systolic (congestive) heart failure: Secondary | ICD-10-CM | POA: Diagnosis not present

## 2017-03-31 DIAGNOSIS — D509 Iron deficiency anemia, unspecified: Secondary | ICD-10-CM | POA: Diagnosis not present

## 2017-03-31 DIAGNOSIS — Z7901 Long term (current) use of anticoagulants: Secondary | ICD-10-CM | POA: Diagnosis not present

## 2017-03-31 DIAGNOSIS — K5790 Diverticulosis of intestine, part unspecified, without perforation or abscess without bleeding: Secondary | ICD-10-CM | POA: Diagnosis not present

## 2017-03-31 DIAGNOSIS — F418 Other specified anxiety disorders: Secondary | ICD-10-CM | POA: Diagnosis not present

## 2017-03-31 DIAGNOSIS — I071 Rheumatic tricuspid insufficiency: Secondary | ICD-10-CM | POA: Diagnosis not present

## 2017-03-31 DIAGNOSIS — F0390 Unspecified dementia without behavioral disturbance: Secondary | ICD-10-CM | POA: Diagnosis not present

## 2017-04-01 DIAGNOSIS — E871 Hypo-osmolality and hyponatremia: Secondary | ICD-10-CM | POA: Diagnosis not present

## 2017-04-01 DIAGNOSIS — Z950 Presence of cardiac pacemaker: Secondary | ICD-10-CM | POA: Diagnosis not present

## 2017-04-01 DIAGNOSIS — R0602 Shortness of breath: Secondary | ICD-10-CM | POA: Diagnosis not present

## 2017-04-01 DIAGNOSIS — I5023 Acute on chronic systolic (congestive) heart failure: Secondary | ICD-10-CM | POA: Diagnosis not present

## 2017-04-01 DIAGNOSIS — J9621 Acute and chronic respiratory failure with hypoxia: Secondary | ICD-10-CM | POA: Diagnosis not present

## 2017-04-01 DIAGNOSIS — J811 Chronic pulmonary edema: Secondary | ICD-10-CM | POA: Diagnosis not present

## 2017-04-02 DIAGNOSIS — Z950 Presence of cardiac pacemaker: Secondary | ICD-10-CM | POA: Diagnosis not present

## 2017-04-02 DIAGNOSIS — J811 Chronic pulmonary edema: Secondary | ICD-10-CM | POA: Diagnosis not present

## 2017-04-02 DIAGNOSIS — J9621 Acute and chronic respiratory failure with hypoxia: Secondary | ICD-10-CM | POA: Diagnosis not present

## 2017-04-02 DIAGNOSIS — I5023 Acute on chronic systolic (congestive) heart failure: Secondary | ICD-10-CM | POA: Diagnosis not present

## 2017-04-02 DIAGNOSIS — E871 Hypo-osmolality and hyponatremia: Secondary | ICD-10-CM | POA: Diagnosis not present

## 2017-04-02 DIAGNOSIS — R0602 Shortness of breath: Secondary | ICD-10-CM | POA: Diagnosis not present

## 2017-04-05 DIAGNOSIS — J9621 Acute and chronic respiratory failure with hypoxia: Secondary | ICD-10-CM | POA: Diagnosis not present

## 2017-04-05 DIAGNOSIS — J811 Chronic pulmonary edema: Secondary | ICD-10-CM | POA: Diagnosis not present

## 2017-04-05 DIAGNOSIS — R0602 Shortness of breath: Secondary | ICD-10-CM | POA: Diagnosis not present

## 2017-04-05 DIAGNOSIS — E871 Hypo-osmolality and hyponatremia: Secondary | ICD-10-CM | POA: Diagnosis not present

## 2017-04-05 DIAGNOSIS — I5023 Acute on chronic systolic (congestive) heart failure: Secondary | ICD-10-CM | POA: Diagnosis not present

## 2017-04-05 DIAGNOSIS — Z950 Presence of cardiac pacemaker: Secondary | ICD-10-CM | POA: Diagnosis not present

## 2017-04-07 DIAGNOSIS — I5023 Acute on chronic systolic (congestive) heart failure: Secondary | ICD-10-CM | POA: Diagnosis not present

## 2017-04-07 DIAGNOSIS — Z950 Presence of cardiac pacemaker: Secondary | ICD-10-CM | POA: Diagnosis not present

## 2017-04-07 DIAGNOSIS — J9621 Acute and chronic respiratory failure with hypoxia: Secondary | ICD-10-CM | POA: Diagnosis not present

## 2017-04-07 DIAGNOSIS — R0602 Shortness of breath: Secondary | ICD-10-CM | POA: Diagnosis not present

## 2017-04-07 DIAGNOSIS — J811 Chronic pulmonary edema: Secondary | ICD-10-CM | POA: Diagnosis not present

## 2017-04-07 DIAGNOSIS — E871 Hypo-osmolality and hyponatremia: Secondary | ICD-10-CM | POA: Diagnosis not present

## 2017-04-08 DIAGNOSIS — Z950 Presence of cardiac pacemaker: Secondary | ICD-10-CM | POA: Diagnosis not present

## 2017-04-08 DIAGNOSIS — J9621 Acute and chronic respiratory failure with hypoxia: Secondary | ICD-10-CM | POA: Diagnosis not present

## 2017-04-08 DIAGNOSIS — I5023 Acute on chronic systolic (congestive) heart failure: Secondary | ICD-10-CM | POA: Diagnosis not present

## 2017-04-08 DIAGNOSIS — R0602 Shortness of breath: Secondary | ICD-10-CM | POA: Diagnosis not present

## 2017-04-08 DIAGNOSIS — E871 Hypo-osmolality and hyponatremia: Secondary | ICD-10-CM | POA: Diagnosis not present

## 2017-04-08 DIAGNOSIS — J811 Chronic pulmonary edema: Secondary | ICD-10-CM | POA: Diagnosis not present

## 2017-04-12 DIAGNOSIS — I5023 Acute on chronic systolic (congestive) heart failure: Secondary | ICD-10-CM | POA: Diagnosis not present

## 2017-04-12 DIAGNOSIS — Z950 Presence of cardiac pacemaker: Secondary | ICD-10-CM | POA: Diagnosis not present

## 2017-04-12 DIAGNOSIS — E871 Hypo-osmolality and hyponatremia: Secondary | ICD-10-CM | POA: Diagnosis not present

## 2017-04-12 DIAGNOSIS — J9621 Acute and chronic respiratory failure with hypoxia: Secondary | ICD-10-CM | POA: Diagnosis not present

## 2017-04-12 DIAGNOSIS — J811 Chronic pulmonary edema: Secondary | ICD-10-CM | POA: Diagnosis not present

## 2017-04-12 DIAGNOSIS — R0602 Shortness of breath: Secondary | ICD-10-CM | POA: Diagnosis not present

## 2017-04-14 DIAGNOSIS — K5790 Diverticulosis of intestine, part unspecified, without perforation or abscess without bleeding: Secondary | ICD-10-CM | POA: Diagnosis not present

## 2017-04-14 DIAGNOSIS — I340 Nonrheumatic mitral (valve) insufficiency: Secondary | ICD-10-CM | POA: Diagnosis not present

## 2017-04-14 DIAGNOSIS — F418 Other specified anxiety disorders: Secondary | ICD-10-CM | POA: Diagnosis not present

## 2017-04-14 DIAGNOSIS — Z7901 Long term (current) use of anticoagulants: Secondary | ICD-10-CM | POA: Diagnosis not present

## 2017-04-14 DIAGNOSIS — I10 Essential (primary) hypertension: Secondary | ICD-10-CM | POA: Diagnosis not present

## 2017-04-14 DIAGNOSIS — Z950 Presence of cardiac pacemaker: Secondary | ICD-10-CM | POA: Diagnosis not present

## 2017-04-14 DIAGNOSIS — G629 Polyneuropathy, unspecified: Secondary | ICD-10-CM | POA: Diagnosis not present

## 2017-04-14 DIAGNOSIS — K209 Esophagitis, unspecified: Secondary | ICD-10-CM | POA: Diagnosis not present

## 2017-04-14 DIAGNOSIS — I442 Atrioventricular block, complete: Secondary | ICD-10-CM | POA: Diagnosis not present

## 2017-04-14 DIAGNOSIS — J9621 Acute and chronic respiratory failure with hypoxia: Secondary | ICD-10-CM | POA: Diagnosis not present

## 2017-04-14 DIAGNOSIS — J811 Chronic pulmonary edema: Secondary | ICD-10-CM | POA: Diagnosis not present

## 2017-04-14 DIAGNOSIS — I071 Rheumatic tricuspid insufficiency: Secondary | ICD-10-CM | POA: Diagnosis not present

## 2017-04-14 DIAGNOSIS — I5023 Acute on chronic systolic (congestive) heart failure: Secondary | ICD-10-CM | POA: Diagnosis not present

## 2017-04-14 DIAGNOSIS — D509 Iron deficiency anemia, unspecified: Secondary | ICD-10-CM | POA: Diagnosis not present

## 2017-04-14 DIAGNOSIS — R0602 Shortness of breath: Secondary | ICD-10-CM | POA: Diagnosis not present

## 2017-04-14 DIAGNOSIS — E871 Hypo-osmolality and hyponatremia: Secondary | ICD-10-CM | POA: Diagnosis not present

## 2017-04-14 DIAGNOSIS — I4892 Unspecified atrial flutter: Secondary | ICD-10-CM | POA: Diagnosis not present

## 2017-04-14 DIAGNOSIS — F0390 Unspecified dementia without behavioral disturbance: Secondary | ICD-10-CM | POA: Diagnosis not present

## 2017-04-15 DIAGNOSIS — R0602 Shortness of breath: Secondary | ICD-10-CM | POA: Diagnosis not present

## 2017-04-15 DIAGNOSIS — J811 Chronic pulmonary edema: Secondary | ICD-10-CM | POA: Diagnosis not present

## 2017-04-15 DIAGNOSIS — Z950 Presence of cardiac pacemaker: Secondary | ICD-10-CM | POA: Diagnosis not present

## 2017-04-15 DIAGNOSIS — I5023 Acute on chronic systolic (congestive) heart failure: Secondary | ICD-10-CM | POA: Diagnosis not present

## 2017-04-15 DIAGNOSIS — J9621 Acute and chronic respiratory failure with hypoxia: Secondary | ICD-10-CM | POA: Diagnosis not present

## 2017-04-15 DIAGNOSIS — E871 Hypo-osmolality and hyponatremia: Secondary | ICD-10-CM | POA: Diagnosis not present

## 2017-04-18 DIAGNOSIS — R0602 Shortness of breath: Secondary | ICD-10-CM | POA: Diagnosis not present

## 2017-04-18 DIAGNOSIS — Z950 Presence of cardiac pacemaker: Secondary | ICD-10-CM | POA: Diagnosis not present

## 2017-04-18 DIAGNOSIS — E871 Hypo-osmolality and hyponatremia: Secondary | ICD-10-CM | POA: Diagnosis not present

## 2017-04-18 DIAGNOSIS — J811 Chronic pulmonary edema: Secondary | ICD-10-CM | POA: Diagnosis not present

## 2017-04-18 DIAGNOSIS — J9621 Acute and chronic respiratory failure with hypoxia: Secondary | ICD-10-CM | POA: Diagnosis not present

## 2017-04-18 DIAGNOSIS — I5023 Acute on chronic systolic (congestive) heart failure: Secondary | ICD-10-CM | POA: Diagnosis not present

## 2017-04-20 DIAGNOSIS — J811 Chronic pulmonary edema: Secondary | ICD-10-CM | POA: Diagnosis not present

## 2017-04-20 DIAGNOSIS — Z950 Presence of cardiac pacemaker: Secondary | ICD-10-CM | POA: Diagnosis not present

## 2017-04-20 DIAGNOSIS — R0602 Shortness of breath: Secondary | ICD-10-CM | POA: Diagnosis not present

## 2017-04-20 DIAGNOSIS — E871 Hypo-osmolality and hyponatremia: Secondary | ICD-10-CM | POA: Diagnosis not present

## 2017-04-20 DIAGNOSIS — I5023 Acute on chronic systolic (congestive) heart failure: Secondary | ICD-10-CM | POA: Diagnosis not present

## 2017-04-20 DIAGNOSIS — J9621 Acute and chronic respiratory failure with hypoxia: Secondary | ICD-10-CM | POA: Diagnosis not present

## 2017-04-21 DIAGNOSIS — Z950 Presence of cardiac pacemaker: Secondary | ICD-10-CM | POA: Diagnosis not present

## 2017-04-21 DIAGNOSIS — J811 Chronic pulmonary edema: Secondary | ICD-10-CM | POA: Diagnosis not present

## 2017-04-21 DIAGNOSIS — I5023 Acute on chronic systolic (congestive) heart failure: Secondary | ICD-10-CM | POA: Diagnosis not present

## 2017-04-21 DIAGNOSIS — J9621 Acute and chronic respiratory failure with hypoxia: Secondary | ICD-10-CM | POA: Diagnosis not present

## 2017-04-21 DIAGNOSIS — R0602 Shortness of breath: Secondary | ICD-10-CM | POA: Diagnosis not present

## 2017-04-21 DIAGNOSIS — E871 Hypo-osmolality and hyponatremia: Secondary | ICD-10-CM | POA: Diagnosis not present

## 2017-04-23 DIAGNOSIS — Z950 Presence of cardiac pacemaker: Secondary | ICD-10-CM | POA: Diagnosis not present

## 2017-04-23 DIAGNOSIS — I5023 Acute on chronic systolic (congestive) heart failure: Secondary | ICD-10-CM | POA: Diagnosis not present

## 2017-04-23 DIAGNOSIS — E871 Hypo-osmolality and hyponatremia: Secondary | ICD-10-CM | POA: Diagnosis not present

## 2017-04-23 DIAGNOSIS — J9621 Acute and chronic respiratory failure with hypoxia: Secondary | ICD-10-CM | POA: Diagnosis not present

## 2017-04-23 DIAGNOSIS — J811 Chronic pulmonary edema: Secondary | ICD-10-CM | POA: Diagnosis not present

## 2017-04-23 DIAGNOSIS — R0602 Shortness of breath: Secondary | ICD-10-CM | POA: Diagnosis not present

## 2017-04-24 DIAGNOSIS — J811 Chronic pulmonary edema: Secondary | ICD-10-CM | POA: Diagnosis not present

## 2017-04-24 DIAGNOSIS — Z950 Presence of cardiac pacemaker: Secondary | ICD-10-CM | POA: Diagnosis not present

## 2017-04-24 DIAGNOSIS — I5023 Acute on chronic systolic (congestive) heart failure: Secondary | ICD-10-CM | POA: Diagnosis not present

## 2017-04-24 DIAGNOSIS — E871 Hypo-osmolality and hyponatremia: Secondary | ICD-10-CM | POA: Diagnosis not present

## 2017-04-24 DIAGNOSIS — R0602 Shortness of breath: Secondary | ICD-10-CM | POA: Diagnosis not present

## 2017-04-24 DIAGNOSIS — J9621 Acute and chronic respiratory failure with hypoxia: Secondary | ICD-10-CM | POA: Diagnosis not present

## 2017-05-14 DEATH — deceased
